# Patient Record
Sex: Male | Born: 1955 | Race: White | Hispanic: No | Marital: Single | Smoking: Never smoker
Health system: Southern US, Community
[De-identification: ages and names within clinical notes are randomized; demographics above are authoritative.]

## PROBLEM LIST (undated history)

## (undated) DIAGNOSIS — E039 Hypothyroidism, unspecified: Secondary | ICD-10-CM

## (undated) DIAGNOSIS — R112 Nausea with vomiting, unspecified: Secondary | ICD-10-CM

## (undated) DIAGNOSIS — I10 Essential (primary) hypertension: Secondary | ICD-10-CM

## (undated) DIAGNOSIS — Z9889 Other specified postprocedural states: Secondary | ICD-10-CM

## (undated) DIAGNOSIS — M1990 Unspecified osteoarthritis, unspecified site: Secondary | ICD-10-CM

## (undated) DIAGNOSIS — K2289 Other specified disease of esophagus: Secondary | ICD-10-CM

## (undated) DIAGNOSIS — T8859XA Other complications of anesthesia, initial encounter: Secondary | ICD-10-CM

## (undated) HISTORY — PX: OTHER SURGICAL HISTORY: SHX169

## (undated) HISTORY — PX: COLONOSCOPY: SHX174

## (undated) HISTORY — PX: FINGER SURGERY: SHX640

## (undated) MED FILL — Dexamethasone Sodium Phosphate Inj 100 MG/10ML: INTRAMUSCULAR | Qty: 1 | Status: AC

---

## 2006-08-25 ENCOUNTER — Ambulatory Visit: Payer: Self-pay | Admitting: Unknown Physician Specialty

## 2007-09-07 ENCOUNTER — Ambulatory Visit: Payer: Self-pay | Admitting: Family Medicine

## 2013-01-19 ENCOUNTER — Ambulatory Visit: Payer: Self-pay | Admitting: Unknown Physician Specialty

## 2014-03-08 ENCOUNTER — Ambulatory Visit: Payer: Self-pay | Admitting: Urology

## 2014-09-20 DIAGNOSIS — K760 Fatty (change of) liver, not elsewhere classified: Secondary | ICD-10-CM | POA: Insufficient documentation

## 2014-09-20 DIAGNOSIS — E669 Obesity, unspecified: Secondary | ICD-10-CM | POA: Insufficient documentation

## 2015-01-16 ENCOUNTER — Other Ambulatory Visit: Payer: Self-pay | Admitting: Family Medicine

## 2015-01-16 DIAGNOSIS — R3129 Other microscopic hematuria: Secondary | ICD-10-CM

## 2017-08-08 DIAGNOSIS — R7401 Elevation of levels of liver transaminase levels: Secondary | ICD-10-CM | POA: Insufficient documentation

## 2020-06-19 ENCOUNTER — Other Ambulatory Visit: Payer: Self-pay | Admitting: Orthopedic Surgery

## 2020-06-19 DIAGNOSIS — M1711 Unilateral primary osteoarthritis, right knee: Secondary | ICD-10-CM

## 2020-07-03 ENCOUNTER — Ambulatory Visit: Payer: 59

## 2020-07-14 ENCOUNTER — Other Ambulatory Visit: Payer: Self-pay

## 2020-07-14 ENCOUNTER — Ambulatory Visit
Admission: RE | Admit: 2020-07-14 | Discharge: 2020-07-14 | Disposition: A | Discharge status: Home / Self Care | Payer: 59 | Source: Ambulatory Visit | Attending: Orthopedic Surgery | Admitting: Orthopedic Surgery

## 2020-07-14 DIAGNOSIS — M1711 Unilateral primary osteoarthritis, right knee: Secondary | ICD-10-CM

## 2020-08-29 ENCOUNTER — Other Ambulatory Visit: Payer: Self-pay | Admitting: Orthopedic Surgery

## 2020-09-01 ENCOUNTER — Encounter (HOSPITAL_COMMUNITY): Payer: Self-pay | Admitting: Urgent Care

## 2020-09-01 ENCOUNTER — Other Ambulatory Visit: Payer: Self-pay

## 2020-09-01 ENCOUNTER — Encounter
Admission: RE | Admit: 2020-09-01 | Discharge: 2020-09-01 | Disposition: A | Discharge status: Home / Self Care | Payer: 59 | Source: Ambulatory Visit | Attending: Orthopedic Surgery | Admitting: Orthopedic Surgery

## 2020-09-01 DIAGNOSIS — Z01818 Encounter for other preprocedural examination: Secondary | ICD-10-CM | POA: Insufficient documentation

## 2020-09-01 HISTORY — DX: Other specified postprocedural states: R11.2

## 2020-09-01 HISTORY — DX: Hypothyroidism, unspecified: E03.9

## 2020-09-01 HISTORY — DX: Other complications of anesthesia, initial encounter: T88.59XA

## 2020-09-01 HISTORY — DX: Other specified postprocedural states: Z98.890

## 2020-09-01 HISTORY — DX: Unspecified osteoarthritis, unspecified site: M19.90

## 2020-09-01 HISTORY — DX: Essential (primary) hypertension: I10

## 2020-09-01 LAB — CBC WITH DIFFERENTIAL/PLATELET
Abs Immature Granulocytes: 0.01 10*3/uL (ref 0.00–0.07)
Basophils Absolute: 0.1 10*3/uL (ref 0.0–0.1)
Basophils Relative: 2 %
Eosinophils Absolute: 0.1 10*3/uL (ref 0.0–0.5)
Eosinophils Relative: 1 %
HCT: 44.2 % (ref 39.0–52.0)
Hemoglobin: 14.5 g/dL (ref 13.0–17.0)
Immature Granulocytes: 0 %
Lymphocytes Relative: 32 %
Lymphs Abs: 1.9 10*3/uL (ref 0.7–4.0)
MCH: 28.5 pg (ref 26.0–34.0)
MCHC: 32.8 g/dL (ref 30.0–36.0)
MCV: 86.8 fL (ref 80.0–100.0)
Monocytes Absolute: 0.5 10*3/uL (ref 0.1–1.0)
Monocytes Relative: 8 %
Neutro Abs: 3.4 10*3/uL (ref 1.7–7.7)
Neutrophils Relative %: 57 %
Platelets: 218 10*3/uL (ref 150–400)
RBC: 5.09 MIL/uL (ref 4.22–5.81)
RDW: 14 % (ref 11.5–15.5)
WBC: 6 10*3/uL (ref 4.0–10.5)
nRBC: 0 % (ref 0.0–0.2)

## 2020-09-01 LAB — TYPE AND SCREEN
ABO/RH(D): A POS
Antibody Screen: NEGATIVE

## 2020-09-01 LAB — COMPREHENSIVE METABOLIC PANEL
ALT: 131 U/L — ABNORMAL HIGH (ref 0–44)
AST: 73 U/L — ABNORMAL HIGH (ref 15–41)
Albumin: 4.1 g/dL (ref 3.5–5.0)
Alkaline Phosphatase: 32 U/L — ABNORMAL LOW (ref 38–126)
Anion gap: 14 (ref 5–15)
BUN: 17 mg/dL (ref 8–23)
CO2: 26 mmol/L (ref 22–32)
Calcium: 9.4 mg/dL (ref 8.9–10.3)
Chloride: 100 mmol/L (ref 98–111)
Creatinine, Ser: 1.01 mg/dL (ref 0.61–1.24)
GFR, Estimated: >60 mL/min (ref >60)
Glucose, Bld: 120 mg/dL — ABNORMAL HIGH (ref 70–99)
Potassium: 2.5 mmol/L — CL (ref 3.5–5.1)
Sodium: 140 mmol/L (ref 135–145)
Total Bilirubin: 0.7 mg/dL (ref 0.3–1.2)
Total Protein: 7.3 g/dL (ref 6.5–8.1)

## 2020-09-01 LAB — SURGICAL PCR SCREEN
MRSA, PCR: NEGATIVE
Staphylococcus aureus: POSITIVE — AB

## 2020-09-01 LAB — URINALYSIS, ROUTINE W REFLEX MICROSCOPIC
Bilirubin Urine: NEGATIVE
Glucose, UA: NEGATIVE mg/dL
Hgb urine dipstick: NEGATIVE
Ketones, ur: NEGATIVE mg/dL
Leukocytes,Ua: NEGATIVE
Nitrite: NEGATIVE
Protein, ur: NEGATIVE mg/dL
Specific Gravity, Urine: 1.021 (ref 1.005–1.030)
pH: 6 (ref 5.0–8.0)

## 2020-09-01 NOTE — Patient Instructions (Addendum)
''''''Your procedure is scheduled on: 09/12/20- TUESDAY Report to the Registration Desk on the 1st floor of the [facility]. To find out your arrival time, please call [phone] between 1PM - 3PM on: 09/11/20- MONDAY  REMEMBER: Instructions that are not followed completely may result in serious medical risk, up to and including death; or upon the discretion of your surgeon and anesthesiologist your surgery may need to be rescheduled.  Do not eat food after midnight the night before surgery.  No gum chewing, lozengers or hard candies.  You may however, drink CLEAR liquids up to 2 hours before you are scheduled to arrive for your surgery. Do not drink anything within 2 hours of your scheduled arrival time.  Clear liquids include: - water  - apple juice without pulp - gatorade (not RED, PURPLE, OR BLUE) - black coffee or tea (Do NOT add milk or creamers to the coffee or tea) Do NOT drink anything that is not on this list.  In addition, your doctor has ordered for you to drink the provided  Ensure Pre-Surgery Clear Carbohydrate Drink  Drinking this carbohydrate drink up to two hours before surgery helps to reduce insulin resistance and improve patient outcomes. Please complete drinking 2 hours prior to scheduled arrival time.  TAKE THESE MEDICATIONS THE MORNING OF SURGERY WITH A SIP OF WATER: - levothyroxine (SYNTHROID) 100 MCG tablet  One week prior to surgery: STOP TAKING 09/03/20: Stop Anti-inflammatories (NSAIDS) such as Advil, Aleve, Ibuprofen, Motrin, Naproxen, Naprosyn and Aspirin based products such as Excedrin, Goodys Powder, BC Powder. MAY TAKE TYLENOL AS DIRECTED AS NEEDED.  Stop ANY OVER THE COUNTER supplements STARTING 09/03/20 until after surgery.Turmeric 500 MG CAPS, vitamin C (ASCORBIC ACID) 500 MG tablet,Cinnamon 500 MG capsule. (However, you may continue taking Vitamin D, Vitamin B, and multivitamin up until the day before surgery.)  No Alcohol for 24 hours before  or after surgery.  No Smoking including e-cigarettes for 24 hours prior to surgery.  No chewable tobacco products for at least 6 hours prior to surgery.  No nicotine patches on the day of surgery.  Do not use any "recreational" drugs for at least a week prior to your surgery.  Please be advised that the combination of cocaine and anesthesia may have negative outcomes, up to and including death. If you test positive for cocaine, your surgery will be cancelled.  On the morning of surgery brush your teeth with toothpaste and water, you may rinse your mouth with mouthwash if you wish. Do not swallow any toothpaste or mouthwash.  Do not wear jewelry, make-up, hairpins, clips or nail polish.  Do not wear lotions, powders, or perfumes.   Do not shave body from the neck down 48 hours prior to surgery just in case you cut yourself which could leave a site for infection.  Also, freshly shaved skin may become irritated if using the CHG soap.  Contact lenses, hearing aids and dentures may not be worn into surgery.  Do not bring valuables to the hospital. [facility] is not responsible for any missing/lost belongings or valuables.   Use CHG Soap or wipes as directed on instruction sheet.  Bring your C-PAP to the hospital with you in case you may have to spend the night.   Notify your doctor if there is any change in your medical condition (cold, fever, infection).  Wear comfortable clothing (specific to your surgery type) to the hospital.  Plan for stool softeners for home use; pain medications have a  tendency to cause constipation. You can also help prevent constipation by eating foods high in fiber such as fruits and vegetables and drinking plenty of fluids as your diet allows.  After surgery, you can help prevent lung complications by doing breathing exercises.  Take deep breaths and cough every 1-2 hours. Your doctor may order a device called an Incentive Spirometer to help you take deep  breaths. When coughing or sneezing, hold a pillow firmly against your incision with both hands. This is called "splinting." Doing this helps protect your incision. It also decreases belly discomfort.  If you are being admitted to the hospital overnight, leave your suitcase in the car. After surgery it may be brought to your room.  If you are being discharged the day of surgery, you will not be allowed to drive home. You will need a responsible adult (18 years or older) to drive you home and stay with you that night.   If you are taking public transportation, you will need to have a responsible adult (18 years or older) with you. Please confirm with your physician that it is acceptable to use public transportation.   Please call the [place] Dept. at [phone] if you have any questions about these instructions.  Visitation Policy:  Patients undergoing a surgery or procedure may have one family member or support person with them as long as that person is not COVID-19 positive or experiencing its symptoms.  That person may remain in the waiting area during the procedure.  Inpatient Visitation:    Visiting hours are 7 a.m. to 8 p.m. Patients will be allowed one visitor. The visitor may change daily. The visitor must pass COVID-19 screenings, use hand sanitizer when entering and exiting the patient's room and wear a mask at all times, including in the patient's room. Patients must also wear a mask when staff or their visitor are in the room. Masking is required regardless of vaccination status. Systemwide, no visitors 17 or younger.

## 2020-09-01 NOTE — Progress Notes (Signed)
  [facility] Perioperative Services: Pre-Admission/Anesthesia Testing  Abnormal Lab Notification   Date: 09/01/20  Name: Jonathan [name] MRN:   [id number]  Re: Abnormal labs noted during PAT appointment   Provider(s) Notified: Hessie Knows, MD Notification mode: Routed and/or faxed via [facility] LAB VALUE(S): Lab Results  Component Value Date   K 2.5 (LL) 09/01/2020   AST 73 (H) 09/01/2020   ALT 131 (H) 09/01/2020   Notes:  Patient is scheduled for a TOTAL KNEE ARTHROPLASTY (Right Knee) on 09/12/2020.   1. HYPOkalemia  EMR reviewed. Patient is on daily diuretic therapy (chlorthalidone 25 mg).   Patient already taking daily Klor-Con 20 mEq.    Forwarded to surgeon for review and optimization.   Order entered for K+ to be rechecked on day of procedure.   2. Transaminitis  Historical lab data reviewed. Transient elevations noted of AST and ALT noted dating back to 2015.   ALP and total bilirubin have been WNL.   Today's values are the highest recorded for this patient that I am able to see.   PMH (+) for obesity and hepatic steatosis. Denies ETOH use.   Forwarding results to surgeon for review.   This is a Community education officer; no formal response is required.  Honor Loh, MSN, APRN, FNP-C, CEN [facility]  Peri-operative Services Nurse Practitioner Phone: [phone] Fax: [phone] 09/01/20 11:45 AM

## 2020-09-08 ENCOUNTER — Other Ambulatory Visit: Admission: RE | Admit: 2020-09-08 | Payer: 59 | Source: Ambulatory Visit

## 2020-09-12 ENCOUNTER — Encounter: Admission: RE | Payer: Self-pay | Source: Home / Self Care

## 2020-09-12 ENCOUNTER — Ambulatory Visit: Admission: RE | Admit: 2020-09-12 | Payer: 59 | Source: Home / Self Care | Admitting: Orthopedic Surgery

## 2020-09-12 SURGERY — ARTHROPLASTY, KNEE, TOTAL
Anesthesia: Choice | Site: Knee | Laterality: Right

## 2021-02-02 ENCOUNTER — Other Ambulatory Visit (HOSPITAL_COMMUNITY): Payer: Self-pay | Admitting: Gastroenterology

## 2021-02-02 ENCOUNTER — Other Ambulatory Visit: Payer: Self-pay | Admitting: Gastroenterology

## 2021-02-02 DIAGNOSIS — K760 Fatty (change of) liver, not elsewhere classified: Secondary | ICD-10-CM

## 2021-02-02 DIAGNOSIS — R7401 Elevation of levels of liver transaminase levels: Secondary | ICD-10-CM

## 2021-02-26 ENCOUNTER — Ambulatory Visit: Payer: 59

## 2021-03-07 ENCOUNTER — Other Ambulatory Visit: Payer: Self-pay

## 2021-03-07 ENCOUNTER — Ambulatory Visit
Admission: RE | Admit: 2021-03-07 | Discharge: 2021-03-07 | Disposition: A | Discharge status: Home / Self Care | Payer: 59 | Source: Ambulatory Visit | Attending: Gastroenterology | Admitting: Gastroenterology

## 2021-03-07 DIAGNOSIS — K760 Fatty (change of) liver, not elsewhere classified: Secondary | ICD-10-CM | POA: Insufficient documentation

## 2021-03-07 DIAGNOSIS — R7401 Elevation of levels of liver transaminase levels: Secondary | ICD-10-CM | POA: Diagnosis present

## 2021-04-06 ENCOUNTER — Other Ambulatory Visit: Payer: Self-pay | Admitting: Otolaryngology

## 2021-04-06 DIAGNOSIS — H9311 Tinnitus, right ear: Secondary | ICD-10-CM

## 2021-04-18 ENCOUNTER — Ambulatory Visit
Admission: RE | Admit: 2021-04-18 | Discharge: 2021-04-18 | Disposition: A | Discharge status: Home / Self Care | Payer: 59 | Source: Ambulatory Visit | Attending: Otolaryngology | Admitting: Otolaryngology

## 2021-04-18 ENCOUNTER — Other Ambulatory Visit: Payer: Self-pay

## 2021-04-18 DIAGNOSIS — H9311 Tinnitus, right ear: Secondary | ICD-10-CM | POA: Insufficient documentation

## 2021-04-18 MED ORDER — GADOBUTROL 1 MMOL/ML IV SOLN
10.0000 mL | Freq: Once | INTRAVENOUS | Status: AC | PRN
  Administered 2021-04-18: 10 mL via INTRAVENOUS

## 2021-07-31 DIAGNOSIS — M9902 Segmental and somatic dysfunction of thoracic region: Secondary | ICD-10-CM | POA: Diagnosis not present

## 2021-07-31 DIAGNOSIS — M9901 Segmental and somatic dysfunction of cervical region: Secondary | ICD-10-CM | POA: Diagnosis not present

## 2021-07-31 DIAGNOSIS — M62830 Muscle spasm of back: Secondary | ICD-10-CM | POA: Diagnosis not present

## 2021-07-31 DIAGNOSIS — M9903 Segmental and somatic dysfunction of lumbar region: Secondary | ICD-10-CM | POA: Diagnosis not present

## 2021-09-04 DIAGNOSIS — M9901 Segmental and somatic dysfunction of cervical region: Secondary | ICD-10-CM | POA: Diagnosis not present

## 2021-09-04 DIAGNOSIS — M9902 Segmental and somatic dysfunction of thoracic region: Secondary | ICD-10-CM | POA: Diagnosis not present

## 2021-09-04 DIAGNOSIS — M62830 Muscle spasm of back: Secondary | ICD-10-CM | POA: Diagnosis not present

## 2021-09-04 DIAGNOSIS — M9903 Segmental and somatic dysfunction of lumbar region: Secondary | ICD-10-CM | POA: Diagnosis not present

## 2021-10-02 DIAGNOSIS — M9901 Segmental and somatic dysfunction of cervical region: Secondary | ICD-10-CM | POA: Diagnosis not present

## 2021-10-02 DIAGNOSIS — M62830 Muscle spasm of back: Secondary | ICD-10-CM | POA: Diagnosis not present

## 2021-10-02 DIAGNOSIS — M9902 Segmental and somatic dysfunction of thoracic region: Secondary | ICD-10-CM | POA: Diagnosis not present

## 2021-10-02 DIAGNOSIS — M9903 Segmental and somatic dysfunction of lumbar region: Secondary | ICD-10-CM | POA: Diagnosis not present

## 2021-10-17 DIAGNOSIS — E039 Hypothyroidism, unspecified: Secondary | ICD-10-CM | POA: Diagnosis not present

## 2021-10-17 DIAGNOSIS — Z0000 Encounter for general adult medical examination without abnormal findings: Secondary | ICD-10-CM | POA: Diagnosis not present

## 2021-10-17 DIAGNOSIS — Z125 Encounter for screening for malignant neoplasm of prostate: Secondary | ICD-10-CM | POA: Diagnosis not present

## 2021-10-24 DIAGNOSIS — I10 Essential (primary) hypertension: Secondary | ICD-10-CM | POA: Diagnosis not present

## 2021-10-24 DIAGNOSIS — E039 Hypothyroidism, unspecified: Secondary | ICD-10-CM | POA: Diagnosis not present

## 2021-10-24 DIAGNOSIS — Z0000 Encounter for general adult medical examination without abnormal findings: Secondary | ICD-10-CM | POA: Diagnosis not present

## 2021-10-24 DIAGNOSIS — E785 Hyperlipidemia, unspecified: Secondary | ICD-10-CM | POA: Diagnosis not present

## 2021-11-05 DIAGNOSIS — M62830 Muscle spasm of back: Secondary | ICD-10-CM | POA: Diagnosis not present

## 2021-11-05 DIAGNOSIS — M9901 Segmental and somatic dysfunction of cervical region: Secondary | ICD-10-CM | POA: Diagnosis not present

## 2021-11-05 DIAGNOSIS — M9902 Segmental and somatic dysfunction of thoracic region: Secondary | ICD-10-CM | POA: Diagnosis not present

## 2021-11-05 DIAGNOSIS — M9903 Segmental and somatic dysfunction of lumbar region: Secondary | ICD-10-CM | POA: Diagnosis not present

## 2021-11-23 DIAGNOSIS — R7989 Other specified abnormal findings of blood chemistry: Secondary | ICD-10-CM | POA: Diagnosis not present

## 2021-11-23 DIAGNOSIS — R7303 Prediabetes: Secondary | ICD-10-CM | POA: Diagnosis not present

## 2021-11-23 DIAGNOSIS — E039 Hypothyroidism, unspecified: Secondary | ICD-10-CM | POA: Diagnosis not present

## 2021-12-05 DIAGNOSIS — E876 Hypokalemia: Secondary | ICD-10-CM | POA: Diagnosis not present

## 2021-12-05 DIAGNOSIS — R059 Cough, unspecified: Secondary | ICD-10-CM | POA: Diagnosis not present

## 2021-12-05 DIAGNOSIS — I10 Essential (primary) hypertension: Secondary | ICD-10-CM | POA: Diagnosis not present

## 2021-12-11 DIAGNOSIS — M9903 Segmental and somatic dysfunction of lumbar region: Secondary | ICD-10-CM | POA: Diagnosis not present

## 2021-12-11 DIAGNOSIS — M9901 Segmental and somatic dysfunction of cervical region: Secondary | ICD-10-CM | POA: Diagnosis not present

## 2021-12-11 DIAGNOSIS — M9902 Segmental and somatic dysfunction of thoracic region: Secondary | ICD-10-CM | POA: Diagnosis not present

## 2021-12-11 DIAGNOSIS — M62830 Muscle spasm of back: Secondary | ICD-10-CM | POA: Diagnosis not present

## 2021-12-14 DIAGNOSIS — I10 Essential (primary) hypertension: Secondary | ICD-10-CM | POA: Diagnosis not present

## 2022-01-08 DIAGNOSIS — M9903 Segmental and somatic dysfunction of lumbar region: Secondary | ICD-10-CM | POA: Diagnosis not present

## 2022-01-08 DIAGNOSIS — M62830 Muscle spasm of back: Secondary | ICD-10-CM | POA: Diagnosis not present

## 2022-01-08 DIAGNOSIS — M9902 Segmental and somatic dysfunction of thoracic region: Secondary | ICD-10-CM | POA: Diagnosis not present

## 2022-01-08 DIAGNOSIS — M9901 Segmental and somatic dysfunction of cervical region: Secondary | ICD-10-CM | POA: Diagnosis not present

## 2022-01-28 DIAGNOSIS — M1711 Unilateral primary osteoarthritis, right knee: Secondary | ICD-10-CM | POA: Diagnosis not present

## 2022-02-05 DIAGNOSIS — M9902 Segmental and somatic dysfunction of thoracic region: Secondary | ICD-10-CM | POA: Diagnosis not present

## 2022-02-05 DIAGNOSIS — M9901 Segmental and somatic dysfunction of cervical region: Secondary | ICD-10-CM | POA: Diagnosis not present

## 2022-02-05 DIAGNOSIS — M9903 Segmental and somatic dysfunction of lumbar region: Secondary | ICD-10-CM | POA: Diagnosis not present

## 2022-02-05 DIAGNOSIS — M62830 Muscle spasm of back: Secondary | ICD-10-CM | POA: Diagnosis not present

## 2022-02-06 DIAGNOSIS — I10 Essential (primary) hypertension: Secondary | ICD-10-CM | POA: Diagnosis not present

## 2022-02-06 DIAGNOSIS — Z9989 Dependence on other enabling machines and devices: Secondary | ICD-10-CM | POA: Diagnosis not present

## 2022-02-06 DIAGNOSIS — R42 Dizziness and giddiness: Secondary | ICD-10-CM | POA: Diagnosis not present

## 2022-02-06 DIAGNOSIS — H938X1 Other specified disorders of right ear: Secondary | ICD-10-CM | POA: Diagnosis not present

## 2022-02-06 DIAGNOSIS — G4733 Obstructive sleep apnea (adult) (pediatric): Secondary | ICD-10-CM | POA: Diagnosis not present

## 2022-02-13 DIAGNOSIS — M1711 Unilateral primary osteoarthritis, right knee: Secondary | ICD-10-CM | POA: Diagnosis not present

## 2022-03-04 DIAGNOSIS — L408 Other psoriasis: Secondary | ICD-10-CM | POA: Diagnosis not present

## 2022-03-04 DIAGNOSIS — L218 Other seborrheic dermatitis: Secondary | ICD-10-CM | POA: Diagnosis not present

## 2022-03-12 DIAGNOSIS — I10 Essential (primary) hypertension: Secondary | ICD-10-CM | POA: Diagnosis not present

## 2022-03-12 DIAGNOSIS — R7303 Prediabetes: Secondary | ICD-10-CM | POA: Insufficient documentation

## 2022-03-12 DIAGNOSIS — Z6841 Body Mass Index (BMI) 40.0 and over, adult: Secondary | ICD-10-CM | POA: Diagnosis not present

## 2022-03-12 DIAGNOSIS — R7989 Other specified abnormal findings of blood chemistry: Secondary | ICD-10-CM | POA: Diagnosis not present

## 2022-03-12 DIAGNOSIS — E876 Hypokalemia: Secondary | ICD-10-CM | POA: Diagnosis not present

## 2022-03-19 DIAGNOSIS — H6121 Impacted cerumen, right ear: Secondary | ICD-10-CM | POA: Diagnosis not present

## 2022-03-19 DIAGNOSIS — H6063 Unspecified chronic otitis externa, bilateral: Secondary | ICD-10-CM | POA: Diagnosis not present

## 2022-03-19 DIAGNOSIS — H93291 Other abnormal auditory perceptions, right ear: Secondary | ICD-10-CM | POA: Diagnosis not present

## 2022-03-26 DIAGNOSIS — M62830 Muscle spasm of back: Secondary | ICD-10-CM | POA: Diagnosis not present

## 2022-03-26 DIAGNOSIS — M9903 Segmental and somatic dysfunction of lumbar region: Secondary | ICD-10-CM | POA: Diagnosis not present

## 2022-03-26 DIAGNOSIS — M9902 Segmental and somatic dysfunction of thoracic region: Secondary | ICD-10-CM | POA: Diagnosis not present

## 2022-03-26 DIAGNOSIS — M9901 Segmental and somatic dysfunction of cervical region: Secondary | ICD-10-CM | POA: Diagnosis not present

## 2022-04-04 DIAGNOSIS — N189 Chronic kidney disease, unspecified: Secondary | ICD-10-CM | POA: Diagnosis not present

## 2022-04-04 DIAGNOSIS — I129 Hypertensive chronic kidney disease with stage 1 through stage 4 chronic kidney disease, or unspecified chronic kidney disease: Secondary | ICD-10-CM | POA: Diagnosis not present

## 2022-04-04 DIAGNOSIS — M1990 Unspecified osteoarthritis, unspecified site: Secondary | ICD-10-CM | POA: Diagnosis not present

## 2022-04-04 DIAGNOSIS — Z008 Encounter for other general examination: Secondary | ICD-10-CM | POA: Diagnosis not present

## 2022-04-04 DIAGNOSIS — Z86010 Personal history of colonic polyps: Secondary | ICD-10-CM | POA: Diagnosis not present

## 2022-04-04 DIAGNOSIS — E039 Hypothyroidism, unspecified: Secondary | ICD-10-CM | POA: Diagnosis not present

## 2022-04-04 DIAGNOSIS — G4733 Obstructive sleep apnea (adult) (pediatric): Secondary | ICD-10-CM | POA: Diagnosis not present

## 2022-04-04 DIAGNOSIS — Z6841 Body Mass Index (BMI) 40.0 and over, adult: Secondary | ICD-10-CM | POA: Diagnosis not present

## 2022-04-10 DIAGNOSIS — G4733 Obstructive sleep apnea (adult) (pediatric): Secondary | ICD-10-CM | POA: Diagnosis not present

## 2022-04-22 DIAGNOSIS — M1712 Unilateral primary osteoarthritis, left knee: Secondary | ICD-10-CM | POA: Diagnosis not present

## 2022-04-23 DIAGNOSIS — M62830 Muscle spasm of back: Secondary | ICD-10-CM | POA: Diagnosis not present

## 2022-04-23 DIAGNOSIS — M9902 Segmental and somatic dysfunction of thoracic region: Secondary | ICD-10-CM | POA: Diagnosis not present

## 2022-04-23 DIAGNOSIS — M9903 Segmental and somatic dysfunction of lumbar region: Secondary | ICD-10-CM | POA: Diagnosis not present

## 2022-04-23 DIAGNOSIS — M9901 Segmental and somatic dysfunction of cervical region: Secondary | ICD-10-CM | POA: Diagnosis not present

## 2022-04-26 DIAGNOSIS — H903 Sensorineural hearing loss, bilateral: Secondary | ICD-10-CM | POA: Diagnosis not present

## 2022-04-26 DIAGNOSIS — H9121 Sudden idiopathic hearing loss, right ear: Secondary | ICD-10-CM | POA: Diagnosis not present

## 2022-04-26 DIAGNOSIS — H6121 Impacted cerumen, right ear: Secondary | ICD-10-CM | POA: Diagnosis not present

## 2022-04-26 DIAGNOSIS — H8101 Meniere's disease, right ear: Secondary | ICD-10-CM | POA: Diagnosis not present

## 2022-05-21 DIAGNOSIS — M9901 Segmental and somatic dysfunction of cervical region: Secondary | ICD-10-CM | POA: Diagnosis not present

## 2022-05-21 DIAGNOSIS — M62830 Muscle spasm of back: Secondary | ICD-10-CM | POA: Diagnosis not present

## 2022-05-21 DIAGNOSIS — M9903 Segmental and somatic dysfunction of lumbar region: Secondary | ICD-10-CM | POA: Diagnosis not present

## 2022-05-21 DIAGNOSIS — M9902 Segmental and somatic dysfunction of thoracic region: Secondary | ICD-10-CM | POA: Diagnosis not present

## 2022-05-28 DIAGNOSIS — H8101 Meniere's disease, right ear: Secondary | ICD-10-CM | POA: Diagnosis not present

## 2022-06-14 DIAGNOSIS — H90A21 Sensorineural hearing loss, unilateral, right ear, with restricted hearing on the contralateral side: Secondary | ICD-10-CM | POA: Diagnosis not present

## 2022-06-14 DIAGNOSIS — H8101 Meniere's disease, right ear: Secondary | ICD-10-CM | POA: Diagnosis not present

## 2022-06-14 DIAGNOSIS — H6121 Impacted cerumen, right ear: Secondary | ICD-10-CM | POA: Diagnosis not present

## 2022-06-18 DIAGNOSIS — M9902 Segmental and somatic dysfunction of thoracic region: Secondary | ICD-10-CM | POA: Diagnosis not present

## 2022-06-18 DIAGNOSIS — M9903 Segmental and somatic dysfunction of lumbar region: Secondary | ICD-10-CM | POA: Diagnosis not present

## 2022-06-18 DIAGNOSIS — M9901 Segmental and somatic dysfunction of cervical region: Secondary | ICD-10-CM | POA: Diagnosis not present

## 2022-06-18 DIAGNOSIS — M62830 Muscle spasm of back: Secondary | ICD-10-CM | POA: Diagnosis not present

## 2022-06-24 DIAGNOSIS — H5213 Myopia, bilateral: Secondary | ICD-10-CM | POA: Diagnosis not present

## 2022-06-24 DIAGNOSIS — Z0100 Encounter for examination of eyes and vision without abnormal findings: Secondary | ICD-10-CM | POA: Diagnosis not present

## 2022-07-10 DIAGNOSIS — R7401 Elevation of levels of liver transaminase levels: Secondary | ICD-10-CM | POA: Diagnosis not present

## 2022-07-10 DIAGNOSIS — Z125 Encounter for screening for malignant neoplasm of prostate: Secondary | ICD-10-CM | POA: Diagnosis not present

## 2022-07-10 DIAGNOSIS — H9311 Tinnitus, right ear: Secondary | ICD-10-CM | POA: Diagnosis not present

## 2022-07-10 DIAGNOSIS — R7303 Prediabetes: Secondary | ICD-10-CM | POA: Diagnosis not present

## 2022-07-10 DIAGNOSIS — I10 Essential (primary) hypertension: Secondary | ICD-10-CM | POA: Diagnosis not present

## 2022-07-10 DIAGNOSIS — E876 Hypokalemia: Secondary | ICD-10-CM | POA: Diagnosis not present

## 2022-07-16 DIAGNOSIS — M9902 Segmental and somatic dysfunction of thoracic region: Secondary | ICD-10-CM | POA: Diagnosis not present

## 2022-07-16 DIAGNOSIS — M9903 Segmental and somatic dysfunction of lumbar region: Secondary | ICD-10-CM | POA: Diagnosis not present

## 2022-07-16 DIAGNOSIS — M62830 Muscle spasm of back: Secondary | ICD-10-CM | POA: Diagnosis not present

## 2022-07-16 DIAGNOSIS — M9901 Segmental and somatic dysfunction of cervical region: Secondary | ICD-10-CM | POA: Diagnosis not present

## 2022-08-08 DIAGNOSIS — E876 Hypokalemia: Secondary | ICD-10-CM | POA: Diagnosis not present

## 2022-08-12 ENCOUNTER — Other Ambulatory Visit (HOSPITAL_BASED_OUTPATIENT_CLINIC_OR_DEPARTMENT_OTHER): Payer: Self-pay | Admitting: Family Medicine

## 2022-08-12 DIAGNOSIS — R0789 Other chest pain: Secondary | ICD-10-CM

## 2022-08-12 DIAGNOSIS — R1310 Dysphagia, unspecified: Secondary | ICD-10-CM | POA: Diagnosis not present

## 2022-08-12 DIAGNOSIS — I10 Essential (primary) hypertension: Secondary | ICD-10-CM | POA: Diagnosis not present

## 2022-08-13 DIAGNOSIS — M9903 Segmental and somatic dysfunction of lumbar region: Secondary | ICD-10-CM | POA: Diagnosis not present

## 2022-08-13 DIAGNOSIS — M62830 Muscle spasm of back: Secondary | ICD-10-CM | POA: Diagnosis not present

## 2022-08-13 DIAGNOSIS — M9901 Segmental and somatic dysfunction of cervical region: Secondary | ICD-10-CM | POA: Diagnosis not present

## 2022-08-13 DIAGNOSIS — M9902 Segmental and somatic dysfunction of thoracic region: Secondary | ICD-10-CM | POA: Diagnosis not present

## 2022-08-15 ENCOUNTER — Ambulatory Visit
Admission: RE | Admit: 2022-08-15 | Discharge: 2022-08-15 | Disposition: A | Discharge status: Home / Self Care | Payer: No Typology Code available for payment source | Source: Ambulatory Visit | Attending: Family Medicine | Admitting: Family Medicine

## 2022-08-15 DIAGNOSIS — R0789 Other chest pain: Secondary | ICD-10-CM | POA: Insufficient documentation

## 2022-08-15 DIAGNOSIS — K224 Dyskinesia of esophagus: Secondary | ICD-10-CM | POA: Diagnosis not present

## 2022-08-15 DIAGNOSIS — R1310 Dysphagia, unspecified: Secondary | ICD-10-CM | POA: Insufficient documentation

## 2022-08-15 DIAGNOSIS — K219 Gastro-esophageal reflux disease without esophagitis: Secondary | ICD-10-CM | POA: Diagnosis not present

## 2022-08-21 DIAGNOSIS — R933 Abnormal findings on diagnostic imaging of other parts of digestive tract: Secondary | ICD-10-CM | POA: Diagnosis not present

## 2022-08-21 DIAGNOSIS — Z86010 Personal history of colonic polyps: Secondary | ICD-10-CM | POA: Diagnosis not present

## 2022-08-21 DIAGNOSIS — R1319 Other dysphagia: Secondary | ICD-10-CM | POA: Diagnosis not present

## 2022-08-22 NOTE — H&P (Signed)
Pre-Procedure H&P   Patient ID: Jonathan [name] is a 67 y.o. male.  Gastroenterology Provider: Annamaria Helling, DO  Referring Provider: Octavia Bruckner, PA PCP: Maryland Pink, MD  Date: 08/23/2022  HPI Mr. Jonathan [name] is a 67 y.o. male who presents today for Esophagogastroduodenoscopy for Dysphagia, abnormal barium swallow study .  Patient is experienced several weeks of dysphagia to solid foods.  No dysphagia to pills or liquids.  No odynophagia.  He has had 5 pound intentional weight loss.  Over this time.  He has felt dysphagia with food with localized sticking in the mid to lower sternum.  His primary care provider had him undergo barium swallow study which demonstrated distal marked mucosal irregularity with mural filling defect concerning for malignancy.  Barium tablet easily passed.  A small hiatal hernia was also noted on the study.  He has not noted any melena or hematochezia   He denies any reflux symptoms.  He is currently on omeprazole 20 mg daily that was recently started.  He denies any tobacco use.  Creatinine 1.0 ALT 56 AST 30 total bili 0.4 14.8 hemoglobin MCV 86 platelets 214,000  No family history of GI disease or malignancy  Colonoscopy in March 2019 with 1 tubular adenoma.  2008 colonoscopy internal hemorrhoids and diverticulosis   Past Medical History:  Diagnosis Date   Arthritis    Complication of anesthesia    OCCURRED ONCE YEARS AGO 1981   Hypertension    Hypothyroidism    PONV (postoperative nausea and vomiting)     Past Surgical History:  Procedure Laterality Date   COLONOSCOPY     FINGER SURGERY     TENDON REPAIR IN LEFT KNEE      Family History No h/o GI disease or malignancy  Review of Systems  Constitutional:  Negative for activity change, appetite change, chills, diaphoresis, fatigue, fever and unexpected weight change.  HENT:  Positive for trouble swallowing. Negative for voice change.   Respiratory:  Negative for  shortness of breath and wheezing.   Cardiovascular:  Negative for chest pain, palpitations and leg swelling.  Gastrointestinal:  Negative for abdominal distention, abdominal pain, anal bleeding, blood in stool, constipation, diarrhea, nausea and vomiting.  Musculoskeletal:  Negative for arthralgias and myalgias.  Skin:  Negative for color change and pallor.  Neurological:  Negative for dizziness, syncope and weakness.  Psychiatric/Behavioral:  Negative for confusion. The patient is not nervous/anxious.   All other systems reviewed and are negative.    Medications No current facility-administered medications on file prior to encounter.   Current Outpatient Medications on File Prior to Encounter  Medication Sig Dispense Refill   acetaminophen (TYLENOL) 650 MG CR tablet Take 1,300 mg by mouth every 8 (eight) hours as needed for pain.     atenolol-chlorthalidone (TENORETIC) 50-25 MG tablet Take 1 tablet by mouth every morning.     Cholecalciferol (VITAMIN D3) 125 MCG (5000 UT) CAPS Take 5,000 Units by mouth daily.     Cinnamon 500 MG capsule Take 1,000 mg by mouth daily.     ibuprofen (ADVIL) 200 MG tablet Take 400 mg by mouth every 8 (eight) hours as needed for moderate pain.     levothyroxine (SYNTHROID) 100 MCG tablet Take 100 mcg by mouth daily before breakfast.     melatonin 5 MG TABS Take 5 mg by mouth at bedtime.     Multiple Vitamins-Minerals (MULTIVITAMIN WITH MINERALS) tablet Take 1 tablet by mouth daily.     potassium  chloride SA (KLOR-CON) 20 MEQ tablet Take 20 mEq by mouth daily.     Turmeric 500 MG CAPS Take 500 mg by mouth daily.     vitamin C (ASCORBIC ACID) 500 MG tablet Take 500 mg by mouth daily.      Pertinent medications related to GI and procedure were reviewed by me with the patient prior to the procedure   Current Facility-Administered Medications:    0.9 %  sodium chloride infusion, , Intravenous, Continuous, Annamaria Helling, DO, Last Rate: 20 mL/hr at  08/23/22 1220, 20 mL/hr at 08/23/22 1220      No Known Allergies Allergies were reviewed by me prior to the procedure  Objective   Body mass index is 39.19 kg/m. Vitals:   08/23/22 1205  BP: (!) 141/87  Pulse: 71  Resp: 20  Temp: (!) 97.3 F (36.3 C)  TempSrc: Temporal  SpO2: 99%  Weight: 120.4 kg  Height: '5\' 9"'$  (1.753 m)     Physical Exam Vitals and nursing note reviewed.  Constitutional:      General: He is not in acute distress.    Appearance: Normal appearance. He is not ill-appearing, toxic-appearing or diaphoretic.  HENT:     Head: Normocephalic and atraumatic.     Nose: Nose normal.     Mouth/Throat:     Mouth: Mucous membranes are moist.     Pharynx: Oropharynx is clear.  Eyes:     General: No scleral icterus.    Extraocular Movements: Extraocular movements intact.  Cardiovascular:     Rate and Rhythm: Normal rate and regular rhythm.     Heart sounds: Normal heart sounds. No murmur heard.    No friction rub. No gallop.  Pulmonary:     Effort: Pulmonary effort is normal. No respiratory distress.     Breath sounds: Normal breath sounds. No wheezing, rhonchi or rales.  Abdominal:     General: Bowel sounds are normal. There is no distension.     Palpations: Abdomen is soft.     Tenderness: There is no abdominal tenderness. There is no guarding or rebound.  Musculoskeletal:     Cervical back: Neck supple.     Right lower leg: No edema.     Left lower leg: No edema.  Skin:    General: Skin is warm and dry.     Coloration: Skin is not jaundiced or pale.  Neurological:     General: No focal deficit present.     Mental Status: He is alert and oriented to person, place, and time. Mental status is at baseline.  Psychiatric:        Mood and Affect: Mood normal.        Behavior: Behavior normal.        Thought Content: Thought content normal.        Judgment: Judgment normal.      Assessment:  Mr. Jonathan [name] is a 67 y.o. male  who presents today  for Esophagogastroduodenoscopy for Dysphagia, abnormal barium swallow study .  Plan:  Esophagogastroduodenoscopy with possible intervention today  Esophagogastroduodenoscopy with possible biopsy, control of bleeding, polypectomy, and interventions as necessary has been discussed with the patient/patient representative. Informed consent was obtained from the patient/patient representative after explaining the indication, nature, and risks of the procedure including but not limited to death, bleeding, perforation, missed neoplasm/lesions, cardiorespiratory compromise, and reaction to medications. Opportunity for questions was given and appropriate answers were provided. Patient/patient representative has verbalized understanding is amenable to undergoing  the procedure.   Annamaria Helling, DO  [facility] Gastroenterology  Portions of the record may have been created with voice recognition software. Occasional wrong-word or 'sound-a-like' substitutions may have occurred due to the inherent limitations of voice recognition software.  Read the chart carefully and recognize, using context, where substitutions may have occurred.

## 2022-08-23 ENCOUNTER — Encounter
Admission: RE | Disposition: A | Discharge status: Home / Self Care | Payer: Self-pay | Source: Home / Self Care | Attending: Gastroenterology

## 2022-08-23 ENCOUNTER — Ambulatory Visit: Payer: No Typology Code available for payment source | Admitting: Registered Nurse

## 2022-08-23 ENCOUNTER — Other Ambulatory Visit: Payer: Self-pay | Admitting: Gastroenterology

## 2022-08-23 ENCOUNTER — Ambulatory Visit
Admission: RE | Admit: 2022-08-23 | Discharge: 2022-08-23 | Disposition: A | Discharge status: Home / Self Care | Payer: No Typology Code available for payment source | Attending: Gastroenterology | Admitting: Gastroenterology

## 2022-08-23 ENCOUNTER — Encounter: Payer: Self-pay | Admitting: Gastroenterology

## 2022-08-23 DIAGNOSIS — Z6839 Body mass index (BMI) 39.0-39.9, adult: Secondary | ICD-10-CM | POA: Diagnosis not present

## 2022-08-23 DIAGNOSIS — K2289 Other specified disease of esophagus: Secondary | ICD-10-CM | POA: Diagnosis not present

## 2022-08-23 DIAGNOSIS — E669 Obesity, unspecified: Secondary | ICD-10-CM | POA: Diagnosis not present

## 2022-08-23 DIAGNOSIS — K648 Other hemorrhoids: Secondary | ICD-10-CM | POA: Diagnosis not present

## 2022-08-23 DIAGNOSIS — C155 Malignant neoplasm of lower third of esophagus: Secondary | ICD-10-CM | POA: Insufficient documentation

## 2022-08-23 DIAGNOSIS — Z79899 Other long term (current) drug therapy: Secondary | ICD-10-CM | POA: Diagnosis not present

## 2022-08-23 DIAGNOSIS — R933 Abnormal findings on diagnostic imaging of other parts of digestive tract: Secondary | ICD-10-CM | POA: Insufficient documentation

## 2022-08-23 DIAGNOSIS — K2970 Gastritis, unspecified, without bleeding: Secondary | ICD-10-CM | POA: Insufficient documentation

## 2022-08-23 DIAGNOSIS — C159 Malignant neoplasm of esophagus, unspecified: Secondary | ICD-10-CM | POA: Diagnosis not present

## 2022-08-23 DIAGNOSIS — I10 Essential (primary) hypertension: Secondary | ICD-10-CM | POA: Diagnosis not present

## 2022-08-23 DIAGNOSIS — R1319 Other dysphagia: Secondary | ICD-10-CM

## 2022-08-23 DIAGNOSIS — E039 Hypothyroidism, unspecified: Secondary | ICD-10-CM | POA: Diagnosis not present

## 2022-08-23 HISTORY — PX: ESOPHAGOGASTRODUODENOSCOPY: SHX5428

## 2022-08-23 SURGERY — EGD (ESOPHAGOGASTRODUODENOSCOPY)
Anesthesia: General

## 2022-08-23 MED ORDER — SODIUM CHLORIDE 0.9 % IV SOLN
INTRAVENOUS | Status: DC
Start: 2022-08-23 — End: 2022-08-23
  Administered 2022-08-23: 20 mL/h via INTRAVENOUS

## 2022-08-23 MED ORDER — PROPOFOL 10 MG/ML IV BOLUS
INTRAVENOUS | Status: DC | PRN
  Administered 2022-08-23: 40 mg via INTRAVENOUS

## 2022-08-23 MED ORDER — DEXMEDETOMIDINE HCL IN NACL 200 MCG/50ML IV SOLN
INTRAVENOUS | Status: DC | PRN
  Administered 2022-08-23: 8 ug via INTRAVENOUS

## 2022-08-23 MED ORDER — LIDOCAINE HCL (CARDIAC) PF 100 MG/5ML IV SOSY
PREFILLED_SYRINGE | INTRAVENOUS | Status: DC | PRN
  Administered 2022-08-23: 40 mg via INTRAVENOUS

## 2022-08-23 MED ORDER — PROPOFOL 500 MG/50ML IV EMUL
INTRAVENOUS | Status: DC | PRN
  Administered 2022-08-23: 150 ug/kg/min via INTRAVENOUS

## 2022-08-23 NOTE — Op Note (Signed)
[facility] Gastroenterology Patient Name: Jonathan [name] Procedure Date: 08/23/2022 12:31 PM MRN: [id number] Account #: [id number] Date of Birth: 05-Jul-1956 Admit Type: Outpatient Age: 67 Room: [facility] ENDO ROOM 1 Gender: Male Note Status: Finalized Instrument Name: Upper Endoscope [id number] Procedure:             Upper GI endoscopy Indications:           Abnormal cine-esophagram Providers:             Rueben Bash, DO Referring MD:          Irven Easterly. Kary Kos, MD (Referring MD) Medicines:             Monitored Anesthesia Care Complications:         No immediate complications. Estimated blood loss:                         Minimal. Procedure:             Pre-Anesthesia Assessment:                        - Prior to the procedure, a History and Physical was                         performed, and patient medications and allergies were                         reviewed. The patient is competent. The risks and                         benefits of the procedure and the sedation options and                         risks were discussed with the patient. All questions                         were answered and informed consent was obtained.                         Patient identification and proposed procedure were                         verified by the physician, the nurse, the anesthetist                         and the technician in the endoscopy suite. Mental                         Status Examination: alert and oriented. Airway                         Examination: normal oropharyngeal airway and neck                         mobility. Respiratory Examination: clear to                         auscultation. CV Examination: RRR, no murmurs, no S3  or S4. Prophylactic Antibiotics: The patient does not                         require prophylactic antibiotics. Prior                         Anticoagulants: The patient has taken no anticoagulant                          or antiplatelet agents. ASA Grade Assessment: II - A                         patient with mild systemic disease. After reviewing                         the risks and benefits, the patient was deemed in                         satisfactory condition to undergo the procedure. The                         anesthesia plan was to use monitored anesthesia care                         (MAC). Immediately prior to administration of                         medications, the patient was re-assessed for adequacy                         to receive sedatives. The heart rate, respiratory                         rate, oxygen saturations, blood pressure, adequacy of                         pulmonary ventilation, and response to care were                         monitored throughout the procedure. The physical                         status of the patient was re-assessed after the                         procedure.                        After obtaining informed consent, the endoscope was                         passed under direct vision. Throughout the procedure,                         the patient's blood pressure, pulse, and oxygen                         saturations were monitored continuously. The  Endosonoscope was introduced through the mouth, and                         advanced to the second part of duodenum. The upper GI                         endoscopy was accomplished without difficulty. The                         patient tolerated the procedure well. Findings:      The duodenal bulb, first portion of the duodenum and second portion of       the duodenum were normal. Estimated blood loss: none.      Localized mild inflammation characterized by erosions was found in the       gastric antrum. Biopsies were taken with a cold forceps for Helicobacter       pylori testing. Estimated blood loss was minimal.      The exam of the stomach was otherwise normal.       A large, fungating and ulcerating mass with bleeding and no stigmata of       recent bleeding was found in the lower third of the esophagus, 27 cm       from the incisors. The mass was partially obstructing and       circumferential. Biopsies were taken with a cold forceps for histology.       Circumferential mass extending from 27 to 39 cm from incisors.      Esophagogastric landmarks were identified: the gastroesophageal junction       was found at 39 cm from the incisors.      Esophagus proximal to mass appears normal, Mass does not appear to       invade the cardia Impression:            - Normal duodenal bulb, first portion of the duodenum                         and second portion of the duodenum.                        - Gastritis. Biopsied.                        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                         Biopsied.                        - Esophagogastric landmarks identified. Recommendation:        - Patient has a contact number available for                         emergencies. The signs and symptoms of potential                         delayed complications were discussed with the patient.  Return to normal activities tomorrow. Written                         discharge instructions were provided to the patient.                        - Discharge patient to home.                        - Full liquid diet and soft diet.                        - No aspirin, ibuprofen, naproxen, or other                         non-steroidal anti-inflammatory drugs after biopsy.                        - Continue present medications.                        - Await pathology results.                        - Refer to an oncologist at appointment to be                         scheduled.                        - Return to GI clinic as previously scheduled.                         - The findings and recommendations were discussed with                         the patient.                        - The findings and recommendations were discussed with                         the patient's family.                        - Will arrange for further imaging and testing. our                         office will be in contact Procedure Code(s):     --- Professional ---                        [phone], Esophagogastroduodenoscopy, flexible,                         transoral; with biopsy, single or multiple Diagnosis Code(s):     --- Professional ---                        K29.70, Gastritis, unspecified, without bleeding                        C15.5, Malignant  neoplasm of lower third of esophagus                        R93.3, Abnormal findings on diagnostic imaging of                         other parts of digestive tract CPT copyright 2022 American [employer] Association. All rights reserved. The codes documented in this report are preliminary and upon coder review may  be revised to meet current compliance requirements. Attending Participation:      I personally performed the entire procedure. Volney American, DO Annamaria Helling DO, DO 08/23/2022 1:22:31 PM This report has been signed electronically. Number of Addenda: 0 Note Initiated On: 08/23/2022 12:31 PM Estimated Blood Loss:  Estimated blood loss was minimal.      [facility]

## 2022-08-23 NOTE — Anesthesia Procedure Notes (Signed)
Date/Time: 08/23/2022 12:50 PM  Performed by: Doreen Salvage, CRNAPre-anesthesia Checklist: Patient identified, Emergency Drugs available, Suction available and Patient being monitored Patient Re-evaluated:Patient Re-evaluated prior to induction Oxygen Delivery Method: Nasal cannula Induction Type: IV induction Dental Injury: Teeth and Oropharynx as per pre-operative assessment  Comments: Nasal cannula with etCO2 monitoring

## 2022-08-23 NOTE — Transfer of Care (Signed)
Immediate Anesthesia Transfer of Care Note  Patient: Jonathan [name]  Procedure(s) Performed: Procedure(s): ESOPHAGOGASTRODUODENOSCOPY (EGD) (N/A)  Patient Location: PACU and Endoscopy Unit  Anesthesia Type:General  Level of Consciousness: sedated  Airway & Oxygen Therapy: Patient Spontanous Breathing and Patient connected to nasal cannula oxygen  Post-op Assessment: Report given to RN and Post -op Vital signs reviewed and stable  Post vital signs: Reviewed and stable  Last Vitals:  Vitals:   08/23/22 1205 08/23/22 1313  BP: (!) 141/87 97/63  Pulse: 71 73  Resp: 20 20  Temp: (!) 36.3 C 36.4 C  SpO2: 56% 70%    Complications: No apparent anesthesia complications

## 2022-08-23 NOTE — Anesthesia Preprocedure Evaluation (Signed)
Anesthesia Evaluation  Patient identified by MRN, date of birth, ID band Patient awake    Reviewed: Allergy & Precautions, NPO status , Patient's Chart, lab work & pertinent test results  History of Anesthesia Complications (+) PONV and history of anesthetic complications  Airway Mallampati: III  TM Distance: >3 FB Neck ROM: full    Dental no notable dental hx.    Pulmonary neg pulmonary ROS   Pulmonary exam normal        Cardiovascular hypertension, On Medications and On Home Beta Blockers negative cardio ROS Normal cardiovascular exam     Neuro/Psych negative neurological ROS  negative psych ROS   GI/Hepatic negative GI ROS, Neg liver ROS,,,dysphagia   Endo/Other  Hypothyroidism    Renal/GU negative Renal ROS  negative genitourinary   Musculoskeletal   Abdominal   Peds  Hematology negative hematology ROS (+)   Anesthesia Other Findings Past Medical History: No date: Arthritis No date: Complication of anesthesia     Comment:  OCCURRED ONCE YEARS AGO 1981 No date: Hypertension No date: Hypothyroidism No date: PONV (postoperative nausea and vomiting)  Past Surgical History: No date: COLONOSCOPY No date: FINGER SURGERY No date: TENDON REPAIR IN LEFT KNEE     Reproductive/Obstetrics negative OB ROS                              Anesthesia Physical Anesthesia Plan  ASA: 3  Anesthesia Plan: General   Post-op Pain Management: Minimal or no pain anticipated   Induction: Intravenous  PONV Risk Score and Plan: Propofol infusion and TIVA  Airway Management Planned: Natural Airway and Nasal Cannula  Additional Equipment:   Intra-op Plan:   Post-operative Plan:   Informed Consent: I have reviewed the patients History and Physical, chart, labs and discussed the procedure including the risks, benefits and alternatives for the proposed anesthesia with the patient or  authorized representative who has indicated his/her understanding and acceptance.     Dental Advisory Given  Plan Discussed with: Anesthesiologist, CRNA and Surgeon  Anesthesia Plan Comments: (Patient consented for risks of anesthesia including but not limited to:  - adverse reactions to medications - risk of airway placement if required - damage to eyes, teeth, lips or other oral mucosa - nerve damage due to positioning  - sore throat or hoarseness - Damage to heart, brain, nerves, lungs, other parts of body or loss of life  Patient voiced understanding.)        Anesthesia Quick Evaluation  

## 2022-08-23 NOTE — Interval H&P Note (Signed)
History and Physical Interval Note: Preprocedure H&P from 08/23/22  was reviewed and there was no interval change after seeing and examining the patient.  Written consent was obtained from the patient after discussion of risks, benefits, and alternatives. Patient has consented to proceed with Esophagogastroduodenoscopy with possible intervention   08/23/2022 12:36 PM  Jonathan [name]  has presented today for surgery, with the diagnosis of Esophageal dysphagia (R13.19) Abnormal barium swallow (R93.3).  The various methods of treatment have been discussed with the patient and family. After consideration of risks, benefits and other options for treatment, the patient has consented to  Procedure(s): ESOPHAGOGASTRODUODENOSCOPY (EGD) (N/A) as a surgical intervention.  The patient's history has been reviewed, patient examined, no change in status, stable for surgery.  I have reviewed the patient's chart and labs.  Questions were answered to the patient's satisfaction.     Annamaria Helling

## 2022-08-23 NOTE — Anesthesia Postprocedure Evaluation (Signed)
Anesthesia Post Note  Patient: Jonathan [name]  Procedure(s) Performed: ESOPHAGOGASTRODUODENOSCOPY (EGD)  Patient location during evaluation: Endoscopy Anesthesia Type: General Level of consciousness: awake and alert Pain management: pain level controlled Vital Signs Assessment: post-procedure vital signs reviewed and stable Respiratory status: spontaneous breathing, nonlabored ventilation, respiratory function stable and patient connected to nasal cannula oxygen Cardiovascular status: blood pressure returned to baseline and stable Postop Assessment: no apparent nausea or vomiting Anesthetic complications: no   No notable events documented.   Last Vitals:  Vitals:   08/23/22 1205 08/23/22 1313  BP: (!) 141/87 97/63  Pulse: 71 73  Resp: 20 20  Temp: (!) 36.3 C 36.4 C  SpO2: 99% 95%    Last Pain:  Vitals:   08/23/22 1313  TempSrc: Temporal  PainSc: 0-No pain                 Ilene Qua

## 2022-08-26 ENCOUNTER — Ambulatory Visit
Admission: RE | Admit: 2022-08-26 | Discharge: 2022-08-26 | Disposition: A | Discharge status: Home / Self Care | Payer: No Typology Code available for payment source | Source: Ambulatory Visit | Attending: Gastroenterology | Admitting: Gastroenterology

## 2022-08-26 ENCOUNTER — Encounter: Payer: Self-pay | Admitting: Gastroenterology

## 2022-08-26 DIAGNOSIS — N200 Calculus of kidney: Secondary | ICD-10-CM | POA: Diagnosis not present

## 2022-08-26 DIAGNOSIS — K449 Diaphragmatic hernia without obstruction or gangrene: Secondary | ICD-10-CM | POA: Diagnosis not present

## 2022-08-26 DIAGNOSIS — R1319 Other dysphagia: Secondary | ICD-10-CM | POA: Diagnosis not present

## 2022-08-26 DIAGNOSIS — K8020 Calculus of gallbladder without cholecystitis without obstruction: Secondary | ICD-10-CM | POA: Diagnosis not present

## 2022-08-26 DIAGNOSIS — I700 Atherosclerosis of aorta: Secondary | ICD-10-CM | POA: Insufficient documentation

## 2022-08-26 DIAGNOSIS — J984 Other disorders of lung: Secondary | ICD-10-CM | POA: Diagnosis not present

## 2022-08-26 DIAGNOSIS — K760 Fatty (change of) liver, not elsewhere classified: Secondary | ICD-10-CM | POA: Diagnosis not present

## 2022-08-26 DIAGNOSIS — K2289 Other specified disease of esophagus: Secondary | ICD-10-CM | POA: Diagnosis not present

## 2022-08-26 DIAGNOSIS — I2510 Atherosclerotic heart disease of native coronary artery without angina pectoris: Secondary | ICD-10-CM | POA: Insufficient documentation

## 2022-08-26 DIAGNOSIS — I771 Stricture of artery: Secondary | ICD-10-CM | POA: Diagnosis not present

## 2022-08-26 DIAGNOSIS — C159 Malignant neoplasm of esophagus, unspecified: Secondary | ICD-10-CM | POA: Diagnosis not present

## 2022-08-26 MED ORDER — IOHEXOL 300 MG/ML  SOLN
100.0000 mL | Freq: Once | INTRAMUSCULAR | Status: AC | PRN
  Administered 2022-08-26: 100 mL via INTRAVENOUS

## 2022-08-27 ENCOUNTER — Encounter: Payer: Self-pay | Admitting: Oncology

## 2022-08-27 ENCOUNTER — Telehealth: Payer: Self-pay

## 2022-08-27 NOTE — Telephone Encounter (Signed)
Call made to new patient. Chart reviewed and updated. Medication reconciliation completed. Referral reason, location directions and appt details given to pt. Visitor and mask policy also addressed with pt. All questions answered.

## 2022-08-28 ENCOUNTER — Inpatient Hospital Stay: Payer: No Typology Code available for payment source

## 2022-08-28 ENCOUNTER — Other Ambulatory Visit: Payer: Self-pay

## 2022-08-28 ENCOUNTER — Inpatient Hospital Stay: Payer: No Typology Code available for payment source | Attending: Oncology | Admitting: Oncology

## 2022-08-28 VITALS — BP 152/92 | HR 70 | Temp 98.1°F | Resp 18 | Ht 69.0 in | Wt 265.2 lb

## 2022-08-28 DIAGNOSIS — I2510 Atherosclerotic heart disease of native coronary artery without angina pectoris: Secondary | ICD-10-CM | POA: Diagnosis not present

## 2022-08-28 DIAGNOSIS — C7800 Secondary malignant neoplasm of unspecified lung: Secondary | ICD-10-CM | POA: Diagnosis not present

## 2022-08-28 DIAGNOSIS — Z803 Family history of malignant neoplasm of breast: Secondary | ICD-10-CM | POA: Diagnosis not present

## 2022-08-28 DIAGNOSIS — C159 Malignant neoplasm of esophagus, unspecified: Secondary | ICD-10-CM | POA: Insufficient documentation

## 2022-08-28 DIAGNOSIS — R634 Abnormal weight loss: Secondary | ICD-10-CM | POA: Diagnosis not present

## 2022-08-28 DIAGNOSIS — Z8249 Family history of ischemic heart disease and other diseases of the circulatory system: Secondary | ICD-10-CM | POA: Diagnosis not present

## 2022-08-28 DIAGNOSIS — K760 Fatty (change of) liver, not elsewhere classified: Secondary | ICD-10-CM | POA: Diagnosis not present

## 2022-08-28 DIAGNOSIS — K2970 Gastritis, unspecified, without bleeding: Secondary | ICD-10-CM | POA: Insufficient documentation

## 2022-08-28 DIAGNOSIS — Z8042 Family history of malignant neoplasm of prostate: Secondary | ICD-10-CM | POA: Diagnosis not present

## 2022-08-28 DIAGNOSIS — I700 Atherosclerosis of aorta: Secondary | ICD-10-CM | POA: Diagnosis not present

## 2022-08-28 DIAGNOSIS — E876 Hypokalemia: Secondary | ICD-10-CM | POA: Insufficient documentation

## 2022-08-28 DIAGNOSIS — Z79899 Other long term (current) drug therapy: Secondary | ICD-10-CM | POA: Insufficient documentation

## 2022-08-28 DIAGNOSIS — K8020 Calculus of gallbladder without cholecystitis without obstruction: Secondary | ICD-10-CM | POA: Diagnosis not present

## 2022-08-28 DIAGNOSIS — N200 Calculus of kidney: Secondary | ICD-10-CM | POA: Diagnosis not present

## 2022-08-28 DIAGNOSIS — Z7189 Other specified counseling: Secondary | ICD-10-CM | POA: Insufficient documentation

## 2022-08-28 DIAGNOSIS — C155 Malignant neoplasm of lower third of esophagus: Secondary | ICD-10-CM | POA: Diagnosis not present

## 2022-08-28 LAB — CBC WITH DIFFERENTIAL/PLATELET
Abs Immature Granulocytes: 0.03 10*3/uL (ref 0.00–0.07)
Basophils Absolute: 0.1 10*3/uL (ref 0.0–0.1)
Basophils Relative: 1 %
Eosinophils Absolute: 0.1 10*3/uL (ref 0.0–0.5)
Eosinophils Relative: 1 %
HCT: 42.6 % (ref 39.0–52.0)
Hemoglobin: 14 g/dL (ref 13.0–17.0)
Immature Granulocytes: 0 %
Lymphocytes Relative: 24 %
Lymphs Abs: 2.2 10*3/uL (ref 0.7–4.0)
MCH: 28 pg (ref 26.0–34.0)
MCHC: 32.9 g/dL (ref 30.0–36.0)
MCV: 85.2 fL (ref 80.0–100.0)
Monocytes Absolute: 0.9 10*3/uL (ref 0.1–1.0)
Monocytes Relative: 9 %
Neutro Abs: 6.1 10*3/uL (ref 1.7–7.7)
Neutrophils Relative %: 65 %
Platelets: 369 10*3/uL (ref 150–400)
RBC: 5 MIL/uL (ref 4.22–5.81)
RDW: 13.4 % (ref 11.5–15.5)
WBC: 9.4 10*3/uL (ref 4.0–10.5)
nRBC: 0 % (ref 0.0–0.2)

## 2022-08-28 LAB — COMPREHENSIVE METABOLIC PANEL
ALT: 46 U/L — ABNORMAL HIGH (ref 0–44)
AST: 34 U/L (ref 15–41)
Albumin: 4.2 g/dL (ref 3.5–5.0)
Alkaline Phosphatase: 43 U/L (ref 38–126)
Anion gap: 12 (ref 5–15)
BUN: 10 mg/dL (ref 8–23)
CO2: 27 mmol/L (ref 22–32)
Calcium: 9.3 mg/dL (ref 8.9–10.3)
Chloride: 99 mmol/L (ref 98–111)
Creatinine, Ser: 1.12 mg/dL (ref 0.61–1.24)
GFR, Estimated: >60 mL/min (ref >60)
Glucose, Bld: 100 mg/dL — ABNORMAL HIGH (ref 70–99)
Potassium: 2.9 mmol/L — ABNORMAL LOW (ref 3.5–5.1)
Sodium: 138 mmol/L (ref 135–145)
Total Bilirubin: 0.5 mg/dL (ref 0.3–1.2)
Total Protein: 7.9 g/dL (ref 6.5–8.1)

## 2022-08-28 MED ORDER — POTASSIUM CHLORIDE CRYS ER 20 MEQ PO TBCR: 20.0000 meq | EXTENDED_RELEASE_TABLET | Freq: Every day | ORAL | 0 refills | Status: DC

## 2022-08-28 NOTE — Assessment & Plan Note (Addendum)
EGD findings, pathology reports CT findings were reviewed and discussed with patient. 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  Gastrohepatic ligament node with a possible regional metastasis. I recommend PET as well as EUS for further staging. Assuming no distant metastasis is found on PET scan, I recommend concurrent chemotherapy with radiation +/- surgery.   plan will be finalized once we get PET scan and EUS results. Check CBC, CMP, CEA.

## 2022-08-28 NOTE — Progress Notes (Signed)
EUS scheduled for 09/12/2022. Provided education and instructions for the procedure. Denies taking anticoagulants. Denies diabetes. Provided printed copy of instructions for EUS. Encouraged to call with any questions.

## 2022-08-28 NOTE — Assessment & Plan Note (Signed)
Potassium 2.9.  Recommend patient to start potassium supplementation.

## 2022-08-28 NOTE — H&P (View-Only) (Signed)
Hematology/Oncology Consult Note [phone] [phone]     REFERRING PROVIDER: Maryland Pink, MD  CHIEF COMPLAINTS/PURPOSE OF CONSULTATION:  Esophageal adenocarcinoma  ASSESSMENT & PLAN:   Adenocarcinoma of esophagus ([place]) EGD findings, pathology reports CT findings were reviewed and discussed with patient. 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  Gastrohepatic ligament node with a possible regional metastasis. I recommend PET as well as EUS for further staging. Assuming no distant metastasis is found on PET scan, I recommend concurrent chemotherapy with radiation +/- surgery.   plan will be finalized once we get PET scan and EUS results. Check CBC, CMP, CEA.  Goals of care, counseling/discussion Discussed with patient  Hypokalemia Potassium 2.9.  Recommend patient to start potassium supplementation.   Orders Placed This Encounter  Procedures   NM PET Image Initial (PI) Skull Base To Thigh    Standing Status:   Future    Standing Expiration Date:   08/28/2023    Order Specific Question:   If indicated for the ordered procedure, I authorize the administration of a radiopharmaceutical per Radiology protocol    Answer:   Yes    Order Specific Question:   Preferred imaging location?    Answer:   [facility]   CBC with Differential/Platelet    Standing Status:   Future    Number of Occurrences:   1    Standing Expiration Date:   08/29/2023   CEA    Standing Status:   Future    Number of Occurrences:   1    Standing Expiration Date:   08/28/2023   Comprehensive metabolic panel    Standing Status:   Future    Number of Occurrences:   1    Standing Expiration Date:   08/28/2023   Follow-up to be determined. All questions were answered. The patient knows to call the clinic with any problems, questions or concerns.  Earlie Server, MD, PhD [facility] Hematology Oncology 08/28/2022   HISTORY OF PRESENTING ILLNESS:  Jonathan [name] 67 y.o. male presents to establish care for esophageal adenocarcinoma I have reviewed his chart and materials related to his cancer extensively and collaborated history with the patient. Summary of oncologic history is as follows: Oncology History  Adenocarcinoma of esophagus ([place])  08/28/2022 Initial Diagnosis   Adenocarcinoma of esophagus   -Patient has noticed worsening of "food stuck/fullness" sensation since November 2023.  Patient had a barium swallow study which commented on marked mucosal irregularity in the distal esophagus with Broaddus base mural filling defect highly suspicious for malignancy.  Patient establish care with gastroenterology. -08/23/2022, EGD showed gastritis and partially obstructing malignant esophageal tumor in the lower third of the esophagus. Esophagus mass biopsy showed adenocarcinoma. Stomach biopsy showed gastric mucosa with no specific histology abnormality.  No significant intestinal metaplastic, dysplastic, granular atrophy or increased inflammation.     08/28/2022 Cancer Staging   Staging form: Esophagus - Adenocarcinoma, AJCC 8th Edition - Clinical stage from 08/28/2022: Stage Unknown (cTX, cN1, cM0) - Signed by Earlie Server, MD on 08/28/2022 Stage prefix: Initial diagnosis   08/28/2022 Imaging   CT chest abdomen pelvis with contrast showed 1. Distal esophageal primary with gastrohepatic ligament nodal metastasis. 2. 2 right-sided pulmonary nodules, the largest of which measures 5 mm and is new since 2015. Pulmonary metastasis not be excluded. 3. Anterior right lower lobe volume loss and minimal soft tissue density, favoring atelectasis or scar. Recommend attention on follow-up. 4. Hepatic steatosis 5. Cholelithiasis 6. Left nephrolithiasis 7. Coronary  artery atherosclerosis. Aortic Atherosclerosis   Patient presents to establish care.  He is not taking PPI. He has intentionally lost some weight. Family history positive for father and paternal  uncle with prostate cancer and sister with breast cancer. Denies any routine alcohol use  Never smoker.   MEDICAL HISTORY:  Past Medical History:  Diagnosis Date   Arthritis    Complication of anesthesia    OCCURRED ONCE YEARS AGO 1981   Hypertension    Hypothyroidism    PONV (postoperative nausea and vomiting)     SURGICAL HISTORY: Past Surgical History:  Procedure Laterality Date   COLONOSCOPY     ESOPHAGOGASTRODUODENOSCOPY N/A 08/23/2022   Procedure: ESOPHAGOGASTRODUODENOSCOPY (EGD);  Surgeon: Annamaria Helling, DO;  Location: [facility] ENDOSCOPY;  Service: Gastroenterology;  Laterality: N/A;   FINGER SURGERY     TENDON REPAIR IN LEFT KNEE      SOCIAL HISTORY: Social History   Socioeconomic History   Marital status: Single    Spouse name: Not on file   Number of children: Not on file   Years of education: Not on file   Highest education level: Not on file  Occupational History   Not on file  Tobacco Use   Smoking status: Never   Smokeless tobacco: Never  Vaping Use   Vaping Use: Never used  Substance and Sexual Activity   Alcohol use: Yes    Comment: OCCASIONALLY   Drug use: Never   Sexual activity: Not on file  Other Topics Concern   Not on file  Social History Narrative   Not on file   Social Determinants of Health   Financial Resource Strain: Not on file  Food Insecurity: No Food Insecurity (08/28/2022)   Hunger Vital Sign    Worried About Running Out of Food in the Last Year: Never true    Ran Out of Food in the Last Year: Never true  Transportation Needs: No Transportation Needs (08/28/2022)   PRAPARE - Hydrologist (Medical): No    Lack of Transportation (Non-Medical): No  Physical Activity: Not on file  Stress: Not on file  Social Connections: Not on file  Intimate Partner Violence: Not At Risk (08/28/2022)   Humiliation, Afraid, Rape, and Kick questionnaire    Fear of Current or Ex-Partner: No    Emotionally  Abused: No    Physically Abused: No    Sexually Abused: No    FAMILY HISTORY: Family History  Problem Relation Age of Onset   Heart attack Mother    Prostate cancer Father    Breast cancer Sister    Prostate cancer Paternal Uncle     ALLERGIES:  has No Known Allergies.  MEDICATIONS:  Current Outpatient Medications  Medication Sig Dispense Refill   acetaminophen (TYLENOL) 650 MG CR tablet Take 1,300 mg by mouth every 8 (eight) hours as needed for pain.     amLODipine (NORVASC) 5 MG tablet Take 5 mg by mouth daily.     atenolol (TENORMIN) 50 MG tablet Take 50 mg by mouth daily.     Cholecalciferol (VITAMIN D3) 125 MCG (5000 UT) CAPS Take 5,000 Units by mouth daily.     hydrochlorothiazide (HYDRODIURIL) 25 MG tablet Take 25 mg by mouth daily.     levothyroxine (SYNTHROID) 100 MCG tablet Take 100 mcg by mouth daily before breakfast.     Multiple Vitamins-Minerals (MULTIVITAMIN WITH MINERALS) tablet Take 1 tablet by mouth daily.     omeprazole (PRILOSEC) 20 MG capsule  Take 20 mg by mouth daily.     Turmeric 500 MG CAPS Take 500 mg by mouth daily.     vitamin C (ASCORBIC ACID) 500 MG tablet Take 500 mg by mouth daily.     No current facility-administered medications for this visit.    Review of Systems  Constitutional:  Negative for appetite change, chills, fatigue, fever and unexpected weight change.  HENT:   Negative for hearing loss and voice change.   Eyes:  Negative for eye problems and icterus.  Respiratory:  Negative for chest tightness, cough and shortness of breath.   Cardiovascular:  Negative for chest pain and leg swelling.  Gastrointestinal:  Negative for abdominal distention and abdominal pain.       Dysphagia/epigastric fullness  Endocrine: Negative for hot flashes.  Genitourinary:  Negative for difficulty urinating, dysuria and frequency.   Musculoskeletal:  Negative for arthralgias.  Skin:  Negative for itching and rash.  Neurological:  Negative for  light-headedness and numbness.  Hematological:  Negative for adenopathy. Does not bruise/bleed easily.  Psychiatric/Behavioral:  Negative for confusion.      PHYSICAL EXAMINATION: ECOG PERFORMANCE STATUS: 0 - Asymptomatic  Vitals:   08/28/22 0943  BP: (!) 152/92  Pulse: 70  Resp: 18  Temp: 98.1 F (36.7 C)   Filed Weights   08/28/22 0943  Weight: 265 lb 3.2 oz (120.3 kg)    Physical Exam Constitutional:      General: He is not in acute distress.    Appearance: He is obese. He is not diaphoretic.  HENT:     Head: Normocephalic and atraumatic.     Nose: Nose normal.     Mouth/Throat:     Pharynx: No oropharyngeal exudate.  Eyes:     General: No scleral icterus.    Pupils: Pupils are equal, round, and reactive to light.  Cardiovascular:     Rate and Rhythm: Normal rate and regular rhythm.     Heart sounds: No murmur heard. Pulmonary:     Effort: Pulmonary effort is normal. No respiratory distress.     Breath sounds: No rales.  Chest:     Chest wall: No tenderness.  Abdominal:     General: There is no distension.     Palpations: Abdomen is soft.     Tenderness: There is no abdominal tenderness.  Musculoskeletal:        General: Normal range of motion.     Cervical back: Normal range of motion and neck supple.  Skin:    General: Skin is warm and dry.     Findings: No erythema.  Neurological:     Mental Status: He is alert and oriented to person, place, and time.     Cranial Nerves: No cranial nerve deficit.     Motor: No abnormal muscle tone.     Coordination: Coordination normal.  Psychiatric:        Mood and Affect: Affect normal.      LABORATORY DATA:  I have reviewed the data as listed    Latest Ref Rng & Units 08/28/2022   10:45 AM 09/01/2020    8:49 AM  CBC  WBC 4.0 - 10.5 K/uL 9.4  6.0   Hemoglobin 13.0 - 17.0 g/dL 14.0  14.5   Hematocrit 39.0 - 52.0 % 42.6  44.2   Platelets 150 - 400 K/uL 369  218       Latest Ref Rng & Units 08/28/2022    10:45 AM 09/01/2020  8:49 AM  CMP  Glucose 70 - 99 mg/dL 100  120   BUN 8 - 23 mg/dL 10  17   Creatinine 0.61 - 1.24 mg/dL 1.12  1.01   Sodium 135 - 145 mmol/L 138  140   Potassium 3.5 - 5.1 mmol/L 2.9  2.5   Chloride 98 - 111 mmol/L 99  100   CO2 22 - 32 mmol/L 27  26   Calcium 8.9 - 10.3 mg/dL 9.3  9.4   Total Protein 6.5 - 8.1 g/dL 7.9  7.3   Total Bilirubin 0.3 - 1.2 mg/dL 0.5  0.7   Alkaline Phos 38 - 126 U/L 43  32   AST 15 - 41 U/L 34  73   ALT 0 - 44 U/L 46  131      RADIOGRAPHIC STUDIES: I have personally reviewed the radiological images as listed and agreed with the findings in the report. CT CHEST ABDOMEN PELVIS W CONTRAST  Result Date: 08/27/2022 CLINICAL DATA:  Dysphagia. Lower esophageal tumor on endoscopy. * Tracking Code: BO * EXAM: CT CHEST, ABDOMEN, AND PELVIS WITH CONTRAST TECHNIQUE: Multidetector CT imaging of the chest, abdomen and pelvis was performed following the standard protocol during bolus administration of intravenous contrast. RADIATION DOSE REDUCTION: This exam was performed according to the departmental dose-optimization program which includes automated exposure control, adjustment of the mA and/or kV according to patient size and/or use of iterative reconstruction technique. CONTRAST:  157m OMNIPAQUE IOHEXOL 300 MG/ML  SOLN COMPARISON:  Esophagram 08/15/2022. Clinic note of 08/21/2022. endoscopy report of 08/23/2022. 03/07/2021 abdominal ultrasound. Abdominopelvic CT 03/08/2014. FINDINGS: CT CHEST FINDINGS Cardiovascular: Aortic atherosclerosis. Tortuous thoracic aorta. Normal heart size, without pericardial effusion. Lad coronary artery calcification. Mediastinum/Nodes: No mediastinal or hilar adenopathy. Tiny hiatal hernia. The esophageal primary is identified as an area of wall thickening and mucosal hyperenhancement, beginning just below the carina and terminating just above the GE junction. Example 36/3. No significant obstruction. Lungs/Pleura: No  pleural fluid. Mild anterior right lower lobe volume with indeterminate soft tissue density on 111/4. Subpleural 2 mm right middle lobe pulmonary nodule on 87/4. A 5 mm right lower lobe pulmonary nodule on 108/4 is new since 2015 abdominal CT. Musculoskeletal: No acute osseous abnormality. CT ABDOMEN PELVIS FINDINGS Hepatobiliary: Mild hepatic steatosis, without suspicious liver lesion. 4 mm gallstone without acute cholecystitis or biliary duct dilatation. Pancreas: Normal, without mass or ductal dilatation. Spleen: Normal in size, without focal abnormality. Adrenals/Urinary Tract: Normal adrenal glands. 4 mm interpolar left renal collecting system calculus. Interpolar left renal 1.6 cm cyst. Normal right kidney. Involution of previous dominant interpolar left renal cyst or minimally complex cyst posteriorly including at 2.9 cm on 25/10 . In the absence of clinically indicated signs/symptoms require(s) no independent follow-up. No hydronephrosis. Normal urinary bladder. Stomach/Bowel: Normal distal stomach. Scattered colonic diverticula. Normal terminal ileum. Favor dense contrast in the appendix on 86/3. Metallic foreign body felt less likely, especially given recent esophagram. Normal small bowel. Vascular/Lymphatic: Separate origins of the splenic and common hepatic arteries. Aortic atherosclerosis. Gastrohepatic ligament adenopathy, including at 1.5 x 1.5 cm on 48/3. More caudal gastrohepatic ligament node of 9 mm on 55/3. No pelvic sidewall adenopathy. Reproductive: Normal prostate. Other: No significant free fluid. Tiny fat containing right inguinal hernia. No evidence of omental or peritoneal disease. Musculoskeletal: Posterior right acetabular sclerotic lesion of 6 mm is similar to the prior and presumably a bone island. Lumbosacral spondylosis. IMPRESSION: 1. Distal esophageal primary with gastrohepatic ligament nodal metastasis. 2. 2  right-sided pulmonary nodules, the largest of which measures 5 mm and is  new since 2015. Pulmonary metastasis not be excluded. Consider CT follow-up at 3 months. 3. Anterior right lower lobe volume loss and minimal soft tissue density, favoring atelectasis or scar. Recommend attention on follow-up. 4. Hepatic steatosis 5. Cholelithiasis 6. Left nephrolithiasis 7. Coronary artery atherosclerosis. Aortic Atherosclerosis (ICD10-I70.0). Electronically Signed   By: Abigail Miyamoto M.D.   On: 08/27/2022 10:15   DG ESOPHAGUS W DOUBLE CM (HD)  Result Date: 08/15/2022 INDICATION: Patient complains of intermittent dysphagia with midsternal chest discomfort with eating solid foods only. No prior pertinent surgeries. EXAM: ESOPHAGUS/BARIUM SWALLOW/TABLET STUDY TECHNIQUE: Combined double and single contrast examination was performed using effervescent crystals, high-density barium, and thin liquid barium. The patient was observed with fluoroscopy swallowing a 13 mm barium sulphate tablet. This exam was performed by Tsosie Billing, and was supervised and interpreted by Valetta Mole. FLUOROSCOPY: Radiation Exposure Index (as provided by the fluoroscopic device): 86.70 mGy COMPARISON:  None Available. FINDINGS: Swallowing: Appears normal. No vestibular penetration or aspiration seen. Pharynx: Unremarkable. Esophagus: There is marked mucosal irregularity in the distal esophagus with areas of ulceration. There is a broad-based mural based filling defect along the posterior esophageal wall measuring proximally 2.3 cm in length (best appreciated on the last full exposure image and the last cine sequence). Esophageal motility: Esophageal motility in the proximal 2/3 appears within normal limits. There is abnormal motility in the distal third. Hiatal Hernia: Small hiatal hernia. Gastroesophageal reflux: Moderate spontaneous gastroesophageal reflux was seen extending to the midesophagus. Ingested 24m barium tablet: Passed without delay. Other: None. IMPRESSION: 1. Marked mucosal irregularity in the distal  esophagus with broad-based mural filling defect highly suspicious for malignancy. Recommend correlation with endoscopy. 2. Small hiatal hernia. 3. Moderate amount of spontaneous gastroesophageal reflux was seen extending to the mid esophagus. This exam was performed by KTsosie BillingPA-C, and was supervised and interpreted by Dr. NQuintella Baton These results will be called to the ordering clinician or representative by the Radiologist Assistant, and communication documented in the PACS or [employer] Electronically Signed   By: PValetta MoleM.D.   On: 08/15/2022 11:21

## 2022-08-28 NOTE — Progress Notes (Signed)
Pt here to establish care.

## 2022-08-28 NOTE — Progress Notes (Signed)
Hematology/Oncology Consult Note [phone] [phone]     REFERRING PROVIDER: Hedrick, James, MD  CHIEF COMPLAINTS/PURPOSE OF CONSULTATION:  Esophageal adenocarcinoma  ASSESSMENT & PLAN:   Adenocarcinoma of esophagus (HCC) EGD findings, pathology reports CT findings were reviewed and discussed with patient. 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  Gastrohepatic ligament node with a possible regional metastasis. I recommend PET as well as EUS for further staging. Assuming no distant metastasis is found on PET scan, I recommend concurrent chemotherapy with radiation +/- surgery.   plan will be finalized once we get PET scan and EUS results. Check CBC, CMP, CEA.  Goals of care, counseling/discussion Discussed with patient  Hypokalemia Potassium 2.9.  Recommend patient to start potassium supplementation.   Orders Placed This Encounter  Procedures   NM PET Image Initial (PI) Skull Base To Thigh    Standing Status:   Future    Standing Expiration Date:   08/28/2023    Order Specific Question:   If indicated for the ordered procedure, I authorize the administration of a radiopharmaceutical per Radiology protocol    Answer:   Yes    Order Specific Question:   Preferred imaging location?    Answer:   [facility]   CBC with Differential/Platelet    Standing Status:   Future    Number of Occurrences:   1    Standing Expiration Date:   08/29/2023   CEA    Standing Status:   Future    Number of Occurrences:   1    Standing Expiration Date:   08/28/2023   Comprehensive metabolic panel    Standing Status:   Future    Number of Occurrences:   1    Standing Expiration Date:   08/28/2023   Follow-up to be determined. All questions were answered. The patient knows to call the clinic with any problems, questions or concerns.  [name], MD, PhD [facility] 08/28/2022   HISTORY OF PRESENTING ILLNESS:  Jonathan [name]  Jonathan [name] 67 y.o. male presents to establish care for esophageal adenocarcinoma I have reviewed his chart and materials related to his cancer extensively and collaborated history with the patient. Summary of oncologic history is as follows: Oncology History  Adenocarcinoma of esophagus (HCC)  08/28/2022 Initial Diagnosis   Adenocarcinoma of esophagus   -Patient has noticed worsening of "food stuck/fullness" sensation since November 2023.  Patient had a barium swallow study which commented on marked mucosal irregularity in the distal esophagus with Broaddus base mural filling defect highly suspicious for malignancy.  Patient establish care with gastroenterology. -08/23/2022, EGD showed gastritis and partially obstructing malignant esophageal tumor in the lower third of the esophagus. Esophagus mass biopsy showed adenocarcinoma. Stomach biopsy showed gastric mucosa with no specific histology abnormality.  No significant intestinal metaplastic, dysplastic, granular atrophy or increased inflammation.     08/28/2022 Cancer Staging   Staging form: Esophagus - Adenocarcinoma, AJCC 8th Edition - Clinical stage from 08/28/2022: Stage Unknown (cTX, cN1, cM0) - Signed by Allure Greaser, MD on 08/28/2022 Stage prefix: Initial diagnosis   08/28/2022 Imaging   CT chest abdomen pelvis with contrast showed 1. Distal esophageal primary with gastrohepatic ligament nodal metastasis. 2. 2 right-sided pulmonary nodules, the largest of which measures 5 mm and is new since 2015. Pulmonary metastasis not be excluded. 3. Anterior right lower lobe volume loss and minimal soft tissue density, favoring atelectasis or scar. Recommend attention on follow-up. 4. Hepatic steatosis 5. Cholelithiasis 6. Left nephrolithiasis 7. Coronary   artery atherosclerosis. Aortic Atherosclerosis   Patient presents to establish care.  He is not taking PPI. He has intentionally lost some weight. Family history positive for father and paternal  uncle with prostate cancer and sister with breast cancer. Denies any routine alcohol use  Never smoker.   MEDICAL HISTORY:  Past Medical History:  Diagnosis Date   Arthritis    Complication of anesthesia    OCCURRED ONCE YEARS AGO 1981   Hypertension    Hypothyroidism    PONV (postoperative nausea and vomiting)     SURGICAL HISTORY: Past Surgical History:  Procedure Laterality Date   COLONOSCOPY     ESOPHAGOGASTRODUODENOSCOPY N/A 08/23/2022   Procedure: ESOPHAGOGASTRODUODENOSCOPY (EGD);  Surgeon: Russo, Steven Michael, DO;  Location: [facility] ENDOSCOPY;  Service: Gastroenterology;  Laterality: N/A;   FINGER SURGERY     TENDON REPAIR IN LEFT KNEE      SOCIAL HISTORY: Social History   Socioeconomic History   Marital status: Single    Spouse name: Not on file   Number of children: Not on file   Years of education: Not on file   Highest education level: Not on file  Occupational History   Not on file  Tobacco Use   Smoking status: Never   Smokeless tobacco: Never  Vaping Use   Vaping Use: Never used  Substance and Sexual Activity   Alcohol use: Yes    Comment: OCCASIONALLY   Drug use: Never   Sexual activity: Not on file  Other Topics Concern   Not on file  Social History Narrative   Not on file   Social Determinants of Health   Financial Resource Strain: Not on file  Food Insecurity: No Food Insecurity (08/28/2022)   Hunger Vital Sign    Worried About Running Out of Food in the Last Year: Never true    Ran Out of Food in the Last Year: Never true  Transportation Needs: No Transportation Needs (08/28/2022)   PRAPARE - Transportation    Lack of Transportation (Medical): No    Lack of Transportation (Non-Medical): No  Physical Activity: Not on file  Stress: Not on file  Social Connections: Not on file  Intimate Partner Violence: Not At Risk (08/28/2022)   Humiliation, Afraid, Rape, and Kick questionnaire    Fear of Current or Ex-Partner: No    Emotionally  Abused: No    Physically Abused: No    Sexually Abused: No    FAMILY HISTORY: Family History  Problem Relation Age of Onset   Heart attack Mother    Prostate cancer Father    Breast cancer Sister    Prostate cancer Paternal Uncle     ALLERGIES:  has No Known Allergies.  MEDICATIONS:  Current Outpatient Medications  Medication Sig Dispense Refill   acetaminophen (TYLENOL) 650 MG CR tablet Take 1,300 mg by mouth every 8 (eight) hours as needed for pain.     amLODipine (NORVASC) 5 MG tablet Take 5 mg by mouth daily.     atenolol (TENORMIN) 50 MG tablet Take 50 mg by mouth daily.     Cholecalciferol (VITAMIN D3) 125 MCG (5000 UT) CAPS Take 5,000 Units by mouth daily.     hydrochlorothiazide (HYDRODIURIL) 25 MG tablet Take 25 mg by mouth daily.     levothyroxine (SYNTHROID) 100 MCG tablet Take 100 mcg by mouth daily before breakfast.     Multiple Vitamins-Minerals (MULTIVITAMIN WITH MINERALS) tablet Take 1 tablet by mouth daily.     omeprazole (PRILOSEC) 20 MG capsule   Take 20 mg by mouth daily.     Turmeric 500 MG CAPS Take 500 mg by mouth daily.     vitamin C (ASCORBIC ACID) 500 MG tablet Take 500 mg by mouth daily.     No current facility-administered medications for this visit.    Review of Systems  Constitutional:  Negative for appetite change, chills, fatigue, fever and unexpected weight change.  HENT:   Negative for hearing loss and voice change.   Eyes:  Negative for eye problems and icterus.  Respiratory:  Negative for chest tightness, cough and shortness of breath.   Cardiovascular:  Negative for chest pain and leg swelling.  Gastrointestinal:  Negative for abdominal distention and abdominal pain.       Dysphagia/epigastric fullness  Endocrine: Negative for hot flashes.  Genitourinary:  Negative for difficulty urinating, dysuria and frequency.   Musculoskeletal:  Negative for arthralgias.  Skin:  Negative for itching and rash.  Neurological:  Negative for  light-headedness and numbness.  Hematological:  Negative for adenopathy. Does not bruise/bleed easily.  Psychiatric/Behavioral:  Negative for confusion.      PHYSICAL EXAMINATION: ECOG PERFORMANCE STATUS: 0 - Asymptomatic  Vitals:   08/28/22 0943  BP: (!) 152/92  Pulse: 70  Resp: 18  Temp: 98.1 F (36.7 C)   Filed Weights   08/28/22 0943  Weight: 265 lb 3.2 oz (120.3 kg)    Physical Exam Constitutional:      General: He is not in acute distress.    Appearance: He is obese. He is not diaphoretic.  HENT:     Head: Normocephalic and atraumatic.     Nose: Nose normal.     Mouth/Throat:     Pharynx: No oropharyngeal exudate.  Eyes:     General: No scleral icterus.    Pupils: Pupils are equal, round, and reactive to light.  Cardiovascular:     Rate and Rhythm: Normal rate and regular rhythm.     Heart sounds: No murmur heard. Pulmonary:     Effort: Pulmonary effort is normal. No respiratory distress.     Breath sounds: No rales.  Chest:     Chest wall: No tenderness.  Abdominal:     General: There is no distension.     Palpations: Abdomen is soft.     Tenderness: There is no abdominal tenderness.  Musculoskeletal:        General: Normal range of motion.     Cervical back: Normal range of motion and neck supple.  Skin:    General: Skin is warm and dry.     Findings: No erythema.  Neurological:     Mental Status: He is alert and oriented to person, place, and time.     Cranial Nerves: No cranial nerve deficit.     Motor: No abnormal muscle tone.     Coordination: Coordination normal.  Psychiatric:        Mood and Affect: Affect normal.      LABORATORY DATA:  I have reviewed the data as listed    Latest Ref Rng & Units 08/28/2022   10:45 AM 09/01/2020    8:49 AM  CBC  WBC 4.0 - 10.5 [name]/uL 9.4  6.0   Hemoglobin 13.0 - 17.0 g/dL 14.0  14.5   Hematocrit 39.0 - 52.0 % 42.6  44.2   Platelets 150 - 400 [name]/uL 369  218       Latest Ref Rng & Units 08/28/2022    10:45 AM 09/01/2020      8:49 AM  CMP  Glucose 70 - 99 mg/dL 100  120   BUN 8 - 23 mg/dL 10  17   Creatinine 0.61 - 1.24 mg/dL 1.12  1.01   Sodium 135 - 145 mmol/L 138  140   Potassium 3.5 - 5.1 mmol/L 2.9  2.5   Chloride 98 - 111 mmol/L 99  100   CO2 22 - 32 mmol/L 27  26   Calcium 8.9 - 10.3 mg/dL 9.3  9.4   Total Protein 6.5 - 8.1 g/dL 7.9  7.3   Total Bilirubin 0.3 - 1.2 mg/dL 0.5  0.7   Alkaline Phos 38 - 126 U/L 43  32   AST 15 - 41 U/L 34  73   ALT 0 - 44 U/L 46  131      RADIOGRAPHIC STUDIES: I have personally reviewed the radiological images as listed and agreed with the findings in the report. CT CHEST ABDOMEN PELVIS W CONTRAST  Result Date: 08/27/2022 CLINICAL DATA:  Dysphagia. Lower esophageal tumor on endoscopy. * Tracking Code: BO * EXAM: CT CHEST, ABDOMEN, AND PELVIS WITH CONTRAST TECHNIQUE: Multidetector CT imaging of the chest, abdomen and pelvis was performed following the standard protocol during bolus administration of intravenous contrast. RADIATION DOSE REDUCTION: This exam was performed according to the departmental dose-optimization program which includes automated exposure control, adjustment of the mA and/or kV according to patient size and/or use of iterative reconstruction technique. CONTRAST:  100mL OMNIPAQUE IOHEXOL 300 MG/ML  SOLN COMPARISON:  Esophagram 08/15/2022. Clinic note of 08/21/2022. endoscopy report of 08/23/2022. 03/07/2021 abdominal ultrasound. Abdominopelvic CT 03/08/2014. FINDINGS: CT CHEST FINDINGS Cardiovascular: Aortic atherosclerosis. Tortuous thoracic aorta. Normal heart size, without pericardial effusion. Lad coronary artery calcification. Mediastinum/Nodes: No mediastinal or hilar adenopathy. Tiny hiatal hernia. The esophageal primary is identified as an area of wall thickening and mucosal hyperenhancement, beginning just below the carina and terminating just above the GE junction. Example 36/3. No significant obstruction. Lungs/Pleura: No  pleural fluid. Mild anterior right lower lobe volume with indeterminate soft tissue density on 111/4. Subpleural 2 mm right middle lobe pulmonary nodule on 87/4. A 5 mm right lower lobe pulmonary nodule on 108/4 is new since 2015 abdominal CT. Musculoskeletal: No acute osseous abnormality. CT ABDOMEN PELVIS FINDINGS Hepatobiliary: Mild hepatic steatosis, without suspicious liver lesion. 4 mm gallstone without acute cholecystitis or biliary duct dilatation. Pancreas: Normal, without mass or ductal dilatation. Spleen: Normal in size, without focal abnormality. Adrenals/Urinary Tract: Normal adrenal glands. 4 mm interpolar left renal collecting system calculus. Interpolar left renal 1.6 cm cyst. Normal right kidney. Involution of previous dominant interpolar left renal cyst or minimally complex cyst posteriorly including at 2.9 cm on 25/10 . In the absence of clinically indicated signs/symptoms require(s) no independent follow-up. No hydronephrosis. Normal urinary bladder. Stomach/Bowel: Normal distal stomach. Scattered colonic diverticula. Normal terminal ileum. Favor dense contrast in the appendix on 86/3. Metallic foreign body felt less likely, especially given recent esophagram. Normal small bowel. Vascular/Lymphatic: Separate origins of the splenic and common hepatic arteries. Aortic atherosclerosis. Gastrohepatic ligament adenopathy, including at 1.5 x 1.5 cm on 48/3. More caudal gastrohepatic ligament node of 9 mm on 55/3. No pelvic sidewall adenopathy. Reproductive: Normal prostate. Other: No significant free fluid. Tiny fat containing right inguinal hernia. No evidence of omental or peritoneal disease. Musculoskeletal: Posterior right acetabular sclerotic lesion of 6 mm is similar to the prior and presumably a bone island. Lumbosacral spondylosis. IMPRESSION: 1. Distal esophageal primary with gastrohepatic ligament nodal metastasis. 2. 2   right-sided pulmonary nodules, the largest of which measures 5 mm and is  new since 2015. Pulmonary metastasis not be excluded. Consider CT follow-up at 3 months. 3. Anterior right lower lobe volume loss and minimal soft tissue density, favoring atelectasis or scar. Recommend attention on follow-up. 4. Hepatic steatosis 5. Cholelithiasis 6. Left nephrolithiasis 7. Coronary artery atherosclerosis. Aortic Atherosclerosis (ICD10-I70.0). Electronically Signed   By: Kyle  Talbot M.D.   On: 08/27/2022 10:15   DG ESOPHAGUS W DOUBLE CM (HD)  Result Date: 08/15/2022 INDICATION: Patient complains of intermittent dysphagia with midsternal chest discomfort with eating solid foods only. No prior pertinent surgeries. EXAM: ESOPHAGUS/BARIUM SWALLOW/TABLET STUDY TECHNIQUE: Combined double and single contrast examination was performed using effervescent crystals, high-density barium, and thin liquid barium. The patient was observed with fluoroscopy swallowing a 13 mm barium sulphate tablet. This exam was performed by Koreen Morgan, and was supervised and interpreted by Peter Noone. FLUOROSCOPY: Radiation Exposure Index (as provided by the fluoroscopic device): 86.70 mGy COMPARISON:  None Available. FINDINGS: Swallowing: Appears normal. No vestibular penetration or aspiration seen. Pharynx: Unremarkable. Esophagus: There is marked mucosal irregularity in the distal esophagus with areas of ulceration. There is a broad-based mural based filling defect along the posterior esophageal wall measuring proximally 2.3 cm in length (best appreciated on the last full exposure image and the last cine sequence). Esophageal motility: Esophageal motility in the proximal 2/3 appears within normal limits. There is abnormal motility in the distal third. Hiatal Hernia: Small hiatal hernia. Gastroesophageal reflux: Moderate spontaneous gastroesophageal reflux was seen extending to the midesophagus. Ingested 13mm barium tablet: Passed without delay. Other: None. IMPRESSION: 1. Marked mucosal irregularity in the distal  esophagus with broad-based mural filling defect highly suspicious for malignancy. Recommend correlation with endoscopy. 2. Small hiatal hernia. 3. Moderate amount of spontaneous gastroesophageal reflux was seen extending to the mid esophagus. This exam was performed by Koreen Morgan PA-C, and was supervised and interpreted by Dr. Noone. These results will be called to the ordering clinician or representative by the Radiologist Assistant, and communication documented in the PACS or [facility] Dashboard. Electronically Signed   By: Peter  Noone M.D.   On: 08/15/2022 11:21   

## 2022-08-28 NOTE — Assessment & Plan Note (Signed)
Discussed with patient

## 2022-08-29 ENCOUNTER — Telehealth: Payer: Self-pay

## 2022-08-29 LAB — CEA: CEA: 4.1 ng/mL (ref 0.0–4.7)

## 2022-08-29 NOTE — Telephone Encounter (Signed)
-----  Message from Earlie Server, MD sent at 08/28/2022  4:59 PM EST ----- Potassium is low.  I recommend patient to start potassium supplementation.  I have sent a prescription to his pharmacy.  Please let patient know.  Thanks

## 2022-08-29 NOTE — Telephone Encounter (Signed)
Pt informed and verbalized understanding

## 2022-09-03 ENCOUNTER — Encounter: Payer: Self-pay | Admitting: Gastroenterology

## 2022-09-03 LAB — SURGICAL PATHOLOGY

## 2022-09-05 ENCOUNTER — Ambulatory Visit
Admission: RE | Admit: 2022-09-05 | Discharge: 2022-09-05 | Disposition: A | Discharge status: Home / Self Care | Payer: No Typology Code available for payment source | Source: Ambulatory Visit | Attending: Oncology | Admitting: Oncology

## 2022-09-05 DIAGNOSIS — I2510 Atherosclerotic heart disease of native coronary artery without angina pectoris: Secondary | ICD-10-CM | POA: Diagnosis not present

## 2022-09-05 DIAGNOSIS — N200 Calculus of kidney: Secondary | ICD-10-CM | POA: Diagnosis not present

## 2022-09-05 DIAGNOSIS — C159 Malignant neoplasm of esophagus, unspecified: Secondary | ICD-10-CM | POA: Insufficient documentation

## 2022-09-05 DIAGNOSIS — C155 Malignant neoplasm of lower third of esophagus: Secondary | ICD-10-CM | POA: Insufficient documentation

## 2022-09-05 DIAGNOSIS — K8020 Calculus of gallbladder without cholecystitis without obstruction: Secondary | ICD-10-CM | POA: Diagnosis not present

## 2022-09-05 DIAGNOSIS — I700 Atherosclerosis of aorta: Secondary | ICD-10-CM | POA: Insufficient documentation

## 2022-09-05 LAB — GLUCOSE, CAPILLARY: Glucose-Capillary: 99 mg/dL (ref 70–99)

## 2022-09-05 MED ORDER — FLUDEOXYGLUCOSE F - 18 (FDG) INJECTION
13.5000 | Freq: Once | INTRAVENOUS | Status: AC | PRN
  Administered 2022-09-05: 14.76 via INTRAVENOUS

## 2022-09-10 DIAGNOSIS — M9901 Segmental and somatic dysfunction of cervical region: Secondary | ICD-10-CM | POA: Diagnosis not present

## 2022-09-10 DIAGNOSIS — M9903 Segmental and somatic dysfunction of lumbar region: Secondary | ICD-10-CM | POA: Diagnosis not present

## 2022-09-10 DIAGNOSIS — M9902 Segmental and somatic dysfunction of thoracic region: Secondary | ICD-10-CM | POA: Diagnosis not present

## 2022-09-10 DIAGNOSIS — M62830 Muscle spasm of back: Secondary | ICD-10-CM | POA: Diagnosis not present

## 2022-09-11 ENCOUNTER — Encounter: Payer: Self-pay | Admitting: Internal Medicine

## 2022-09-12 ENCOUNTER — Telehealth: Payer: Self-pay

## 2022-09-12 ENCOUNTER — Ambulatory Visit: Payer: No Typology Code available for payment source | Admitting: Registered Nurse

## 2022-09-12 ENCOUNTER — Encounter: Payer: Self-pay | Admitting: Internal Medicine

## 2022-09-12 ENCOUNTER — Encounter
Admission: RE | Disposition: A | Discharge status: Home / Self Care | Payer: Self-pay | Source: Home / Self Care | Attending: Internal Medicine

## 2022-09-12 ENCOUNTER — Ambulatory Visit
Admission: RE | Admit: 2022-09-12 | Discharge: 2022-09-12 | Disposition: A | Discharge status: Home / Self Care | Payer: No Typology Code available for payment source | Attending: Internal Medicine | Admitting: Internal Medicine

## 2022-09-12 DIAGNOSIS — I2510 Atherosclerotic heart disease of native coronary artery without angina pectoris: Secondary | ICD-10-CM | POA: Insufficient documentation

## 2022-09-12 DIAGNOSIS — Z79899 Other long term (current) drug therapy: Secondary | ICD-10-CM | POA: Insufficient documentation

## 2022-09-12 DIAGNOSIS — C154 Malignant neoplasm of middle third of esophagus: Secondary | ICD-10-CM | POA: Diagnosis not present

## 2022-09-12 DIAGNOSIS — K760 Fatty (change of) liver, not elsewhere classified: Secondary | ICD-10-CM | POA: Insufficient documentation

## 2022-09-12 DIAGNOSIS — E876 Hypokalemia: Secondary | ICD-10-CM | POA: Diagnosis not present

## 2022-09-12 DIAGNOSIS — N200 Calculus of kidney: Secondary | ICD-10-CM | POA: Diagnosis not present

## 2022-09-12 DIAGNOSIS — Z803 Family history of malignant neoplasm of breast: Secondary | ICD-10-CM | POA: Insufficient documentation

## 2022-09-12 DIAGNOSIS — I10 Essential (primary) hypertension: Secondary | ICD-10-CM | POA: Insufficient documentation

## 2022-09-12 DIAGNOSIS — K2289 Other specified disease of esophagus: Secondary | ICD-10-CM | POA: Diagnosis not present

## 2022-09-12 DIAGNOSIS — K8020 Calculus of gallbladder without cholecystitis without obstruction: Secondary | ICD-10-CM | POA: Insufficient documentation

## 2022-09-12 DIAGNOSIS — C155 Malignant neoplasm of lower third of esophagus: Secondary | ICD-10-CM | POA: Diagnosis not present

## 2022-09-12 DIAGNOSIS — Z8042 Family history of malignant neoplasm of prostate: Secondary | ICD-10-CM | POA: Diagnosis not present

## 2022-09-12 DIAGNOSIS — C159 Malignant neoplasm of esophagus, unspecified: Secondary | ICD-10-CM

## 2022-09-12 DIAGNOSIS — I899 Noninfective disorder of lymphatic vessels and lymph nodes, unspecified: Secondary | ICD-10-CM | POA: Insufficient documentation

## 2022-09-12 DIAGNOSIS — E039 Hypothyroidism, unspecified: Secondary | ICD-10-CM | POA: Diagnosis not present

## 2022-09-12 HISTORY — PX: EUS: SHX5427

## 2022-09-12 HISTORY — DX: Other specified disease of esophagus: K22.89

## 2022-09-12 SURGERY — ULTRASOUND, UPPER GI TRACT, ENDOSCOPIC
Anesthesia: General

## 2022-09-12 MED ORDER — GLYCOPYRROLATE 0.2 MG/ML IJ SOLN
INTRAMUSCULAR | Status: DC | PRN
  Administered 2022-09-12: .2 mg via INTRAVENOUS

## 2022-09-12 MED ORDER — SODIUM CHLORIDE 0.9 % IV SOLN: INTRAVENOUS | Status: DC

## 2022-09-12 MED ORDER — PROPOFOL 500 MG/50ML IV EMUL
INTRAVENOUS | Status: DC | PRN
  Administered 2022-09-12: 150 ug/kg/min via INTRAVENOUS

## 2022-09-12 MED ORDER — PROPOFOL 10 MG/ML IV BOLUS
INTRAVENOUS | Status: DC | PRN
  Administered 2022-09-12: 100 mg via INTRAVENOUS

## 2022-09-12 MED ORDER — DEXMEDETOMIDINE HCL IN NACL 200 MCG/50ML IV SOLN
INTRAVENOUS | Status: DC | PRN
  Administered 2022-09-12: 8 ug via INTRAVENOUS

## 2022-09-12 MED ORDER — LIDOCAINE HCL (CARDIAC) PF 100 MG/5ML IV SOSY
PREFILLED_SYRINGE | INTRAVENOUS | Status: DC | PRN
  Administered 2022-09-12: 40 mg via INTRAVENOUS

## 2022-09-12 NOTE — Discharge Instructions (Signed)
Discharge to home °

## 2022-09-12 NOTE — Anesthesia Preprocedure Evaluation (Signed)
Anesthesia Evaluation  Patient identified by MRN, date of birth, ID band Patient awake    Reviewed: Allergy & Precautions, NPO status , Patient's Chart, lab work & pertinent test results  History of Anesthesia Complications (+) PONV and history of anesthetic complications  Airway Mallampati: III  TM Distance: >3 FB Neck ROM: full    Dental no notable dental hx.    Pulmonary neg pulmonary ROS   Pulmonary exam normal        Cardiovascular hypertension, On Medications and On Home Beta Blockers negative cardio ROS Normal cardiovascular exam     Neuro/Psych negative neurological ROS  negative psych ROS   GI/Hepatic negative GI ROS, Neg liver ROS,,,dysphagia   Endo/Other  Hypothyroidism    Renal/GU negative Renal ROS  negative genitourinary   Musculoskeletal   Abdominal   Peds  Hematology negative hematology ROS (+)   Anesthesia Other Findings Past Medical History: No date: Arthritis No date: Complication of anesthesia     Comment:  OCCURRED ONCE YEARS AGO 1981 No date: Hypertension No date: Hypothyroidism No date: PONV (postoperative nausea and vomiting)  Past Surgical History: No date: COLONOSCOPY No date: FINGER SURGERY No date: TENDON REPAIR IN LEFT KNEE     Reproductive/Obstetrics negative OB ROS                              Anesthesia Physical Anesthesia Plan  ASA: 3  Anesthesia Plan: General   Post-op Pain Management: Minimal or no pain anticipated   Induction: Intravenous  PONV Risk Score and Plan: Propofol infusion and TIVA  Airway Management Planned: Natural Airway and Nasal Cannula  Additional Equipment:   Intra-op Plan:   Post-operative Plan:   Informed Consent: I have reviewed the patients History and Physical, chart, labs and discussed the procedure including the risks, benefits and alternatives for the proposed anesthesia with the patient or  authorized representative who has indicated his/her understanding and acceptance.     Dental Advisory Given  Plan Discussed with: Anesthesiologist, CRNA and Surgeon  Anesthesia Plan Comments: (Patient consented for risks of anesthesia including but not limited to:  - adverse reactions to medications - risk of airway placement if required - damage to eyes, teeth, lips or other oral mucosa - nerve damage due to positioning  - sore throat or hoarseness - Damage to heart, brain, nerves, lungs, other parts of body or loss of life  Patient voiced understanding.)        Anesthesia Quick Evaluation

## 2022-09-12 NOTE — Transfer of Care (Signed)
Immediate Anesthesia Transfer of Care Note  Patient: Jonathan [name]  Procedure(s) Performed: Procedure(s): FULL UPPER ENDOSCOPIC ULTRASOUND (EUS) RADIAL (N/A)  Patient Location: PACU and Endoscopy Unit  Anesthesia Type:General  Level of Consciousness: sedated  Airway & Oxygen Therapy: Patient Spontanous Breathing and Patient connected to nasal cannula oxygen  Post-op Assessment: Report given to RN and Post -op Vital signs reviewed and stable  Post vital signs: Reviewed and stable  Last Vitals:  Vitals:   09/12/22 1251 09/12/22 1404  BP: 139/78 (!) 94/56  Pulse: 70 71  Resp: 16 15  Temp: 36.6 C (!) 36 C  SpO2: [id number]     Complications: No apparent anesthesia complications

## 2022-09-12 NOTE — Telephone Encounter (Signed)
Looks like he is having a procedure today (same day). He should be good to scheduled tomorrow. Will you add to reminder list for tomorrow please

## 2022-09-12 NOTE — Telephone Encounter (Signed)
-----   Message from Earlie Server, MD sent at 09/11/2022 10:50 PM EST ----- Please let patient know that I recommend MRI abdomen w wo contrast ASAP to further evaluate an area in his liver.  - use diagnosis Adenocarcinoma of esophagus

## 2022-09-12 NOTE — Op Note (Signed)
[facility] Gastroenterology Patient Name: Jonathan [name] Procedure Date: 09/12/2022 1:35 PM MRN: [id number] Account #: [id number] Date of Birth: 1956/03/30 Admit Type: Outpatient Age: 67 Room: [facility] ENDO ROOM 3 Gender: Male Note Status: Finalized Instrument Name: Altamese Cabal Endoscope [id number] [id number] Procedure:             Upper EUS Indications:           Pre-treatment staging of esophageal adenocarcinoma Patient Profile:       Refer to note in patient chart for documentation of                         history and physical. Providers:             Murray Hodgkins. [place] Referring MD:          Irven Easterly. Kary Kos, MD (Referring MD), Earlie Server, MD                         (Referring MD) Medicines:             Propofol per Anesthesia Complications:         No immediate complications. Procedure:             Pre-Anesthesia Assessment:                        - Prior to the procedure, a History and Physical was                         performed, and patient medications and allergies were                         reviewed. The patient is competent. The risks and                         benefits of the procedure and the sedation options and                         risks were discussed with the patient. All questions                         were answered and informed consent was obtained.                         Patient identification and proposed procedure were                         verified by the physician, the nurse and the                         anesthetist in the pre-procedure area. Mental Status                         Examination: alert and oriented. Airway Examination:                         normal oropharyngeal airway and neck mobility.                         Respiratory Examination: clear to auscultation. CV  Examination: normal. Prophylactic Antibiotics: The                         patient does not require prophylactic antibiotics.                          Prior Anticoagulants: The patient has taken no                         anticoagulant or antiplatelet agents. ASA Grade                         Assessment: III - A patient with severe systemic                         disease. After reviewing the risks and benefits, the                         patient was deemed in satisfactory condition to                         undergo the procedure. The anesthesia plan was to use                         monitored anesthesia care (MAC). Immediately prior to                         administration of medications, the patient was                         re-assessed for adequacy to receive sedatives. The                         heart rate, respiratory rate, oxygen saturations,                         blood pressure, adequacy of pulmonary ventilation, and                         response to care were monitored throughout the                         procedure. The physical status of the patient was                         re-assessed after the procedure.                        After obtaining informed consent, the endoscope was                         passed under direct vision. Throughout the procedure,                         the patient's blood pressure, pulse, and oxygen                         saturations were monitored continuously. The Endoscope  was introduced through the mouth, and advanced to the                         second part of duodenum. The Endoscope was introduced                         through the mouth, and advanced to the stomach for                         ultrasound examination from the esophagus and stomach.                         The upper EUS was accomplished without difficulty. The                         patient tolerated the procedure well. Findings:      ENDOSCOPIC FINDING: :      A large, fungating mass was found in the middle/lower third of the       esophagus, 29 to 37 cm from the  incisors. The mass was partially       obstructing and circumferential.      The entire examined stomach was endoscopically normal.      The examined duodenum was endoscopically normal.      ENDOSONOGRAPHIC FINDING: :      A hypoechoic mass was found in the middle/lower third of the esophagus.       The mass was encountered at 29 cm from the incisors and extended to 37       cm. The lesion was circumferential. The endosonographic borders were       well-defined. There was sonographic evidence suggesting invasion into       the adventitia (Layer 5).      One malignant-appearing lymph node was visualized in the gastrohepatic       ligament (level 18). It measured 17 mm by 11 mm in maximal       cross-sectional diameter. The node was oval, hypoechoic and had well       defined margins.      Endosonographic imaging in the left lobe of the liver showed no       abnormalities.      The celiac region was visualized and showed no sign of significant       endosonographic abnormality. Impression:            EGD Impressions:                        - Partially obstructing, malignant esophageal tumor                         was found in the middle/lower third of the esophagus.                         Consistent with known adenocarcinoma.                        - Normal stomach.                        - Normal examined duodenum.  EUS Impressions:                        - A mass was found in the middle/lower third of the                         esophagus. A tissue diagnosis was obtained prior to                         this exam. This is consistent with adenocarcinoma.                         This was staged T3 N1 Mx by endosonographic criteria.                         Please correlate nodal staging with cross-sectional                         imaging.                        - One malignant-appearing lymph node was visualized in                         the gastrohepatic  ligament (level 18). No other                         lymphadenopathy was visualized.                        - Normal visualized portions of the liver.                        - Normal celiac region.                        - No specimens collected. Recommendation:        - Discharge patient to home (ambulatory).                        - The findings and recommendations were discussed with                         the patient.                        - Return to referring medical oncologist, Dr. [name], as                         previously scheduled. Further plan of care to be                         determined by Dr. [name]. Procedure Code(s):     --- Professional ---                        [phone], Esophagogastroduodenoscopy, flexible,                         transoral; with endoscopic ultrasound examination  limited to the esophagus, stomach or duodenum, and                         adjacent structures Diagnosis Code(s):     --- Professional ---                        C15.4, Malignant neoplasm of middle third of esophagus                        C15.5, Malignant neoplasm of lower third of esophagus                        K22.89                        I89.9, Noninfective disorder of lymphatic vessels and                         lymph nodes, unspecified CPT copyright 2022 American Medical Association. All rights reserved. The codes documented in this report are preliminary and upon coder review may  be revised to meet current compliance requirements. Attending Participation:      I personally performed the entire procedure without the assistance of a       fellow, resident or surgical assistant. [place],  09/12/2022 2:07:46 PM This report has been signed electronically. Number of Addenda: 0 Note Initiated On: 09/12/2022 1:35 PM Estimated Blood Loss:  Estimated blood loss: none.      [facility]

## 2022-09-12 NOTE — Interval H&P Note (Signed)
History and Physical Interval Note:  09/12/2022 1:37 PM  Jonathan [name]  has presented today for surgery, with the diagnosis of Esophageal cancer.  The various methods of treatment have been discussed with the patient and family. After consideration of risks, benefits and other options for treatment, the patient has consented to  Procedure(s): FULL UPPER ENDOSCOPIC ULTRASOUND (EUS) RADIAL (N/A) as a surgical intervention.  The patient's history has been reviewed, patient examined, no change in status, stable for surgery.  I have reviewed the patient's chart and labs.  Questions were answered to the patient's satisfaction.     Tillie Rung

## 2022-09-12 NOTE — Telephone Encounter (Signed)
Pt informed of MD recommendation for MRI and verbalized understanding.   Please schedule MRI abdomen ASAP and inform pt of appt

## 2022-09-13 ENCOUNTER — Telehealth: Payer: Self-pay

## 2022-09-13 ENCOUNTER — Encounter: Payer: Self-pay | Admitting: Internal Medicine

## 2022-09-13 ENCOUNTER — Other Ambulatory Visit: Payer: Self-pay | Admitting: Oncology

## 2022-09-13 DIAGNOSIS — C159 Malignant neoplasm of esophagus, unspecified: Secondary | ICD-10-CM

## 2022-09-13 MED ORDER — PROCHLORPERAZINE MALEATE 10 MG PO TABS: 10.0000 mg | ORAL_TABLET | Freq: Four times a day (QID) | ORAL | 1 refills | Status: DC | PRN

## 2022-09-13 MED ORDER — ONDANSETRON HCL 8 MG PO TABS: 8.0000 mg | ORAL_TABLET | Freq: Three times a day (TID) | ORAL | 1 refills | Status: DC | PRN

## 2022-09-13 NOTE — Telephone Encounter (Signed)
Called and spoke with Jonathan [name]. Reviewed results of PET scan and EUS. MRI pending for liver evaluation. In the interim referral sent to radiation and chemo class will be arranged.

## 2022-09-13 NOTE — Progress Notes (Signed)
START ON PATHWAY REGIMEN - Gastroesophageal     Administer weekly during RT:     Paclitaxel      Carboplatin   **Always confirm dose/schedule in your pharmacy ordering system**  Patient Characteristics: Esophageal & GE Junction, Adenocarcinoma, Preoperative or Nonsurgical Candidate, M0 (Clinical Staging), cT2 or Higher or cN+, Surgical Candidate (Up to cT4a) - Preoperative Therapy, Esophageal Therapeutic Status: Preoperative or Nonsurgical Candidate, M0 (Clinical Staging) Histology: Adenocarcinoma Disease Classification: Esophageal AJCC Grade: GX AJCC 8 Stage Grouping: III AJCC T Category: cT3 AJCC N Category: cN1 AJCC M Category: cM0 Intent of Therapy: Curative Intent, Discussed with Patient

## 2022-09-13 NOTE — Telephone Encounter (Signed)
Please arrange him for chemotherapy class carbo and taxol.  Jonathan [name] says she has referred him to establish care with Radonc, so they will contact him with appt.  Future chemo plan depends on when he starts radiation.

## 2022-09-13 NOTE — Telephone Encounter (Signed)
Call made to pt and informed him again of MD recommendation of MRI. He stated that  Jonathan [name] has contacted him and answered questions as well. Informed him that once Dr. [name] has a treatment plan for him, our office will contact him with appt details. Pt verbalized understanding.

## 2022-09-15 ENCOUNTER — Other Ambulatory Visit: Payer: Self-pay

## 2022-09-17 ENCOUNTER — Other Ambulatory Visit: Payer: Self-pay

## 2022-09-19 ENCOUNTER — Ambulatory Visit
Admission: RE | Admit: 2022-09-19 | Discharge: 2022-09-19 | Disposition: A | Discharge status: Home / Self Care | Payer: No Typology Code available for payment source | Source: Ambulatory Visit | Attending: Radiation Oncology | Admitting: Radiation Oncology

## 2022-09-19 ENCOUNTER — Encounter: Payer: Self-pay | Admitting: Radiation Oncology

## 2022-09-19 VITALS — BP 138/81 | HR 72 | Temp 98.1°F | Resp 18 | Ht 69.0 in | Wt 256.4 lb

## 2022-09-19 DIAGNOSIS — Z79890 Hormone replacement therapy (postmenopausal): Secondary | ICD-10-CM | POA: Insufficient documentation

## 2022-09-19 DIAGNOSIS — Z79899 Other long term (current) drug therapy: Secondary | ICD-10-CM | POA: Insufficient documentation

## 2022-09-19 DIAGNOSIS — Z8042 Family history of malignant neoplasm of prostate: Secondary | ICD-10-CM | POA: Insufficient documentation

## 2022-09-19 DIAGNOSIS — C159 Malignant neoplasm of esophagus, unspecified: Secondary | ICD-10-CM | POA: Insufficient documentation

## 2022-09-19 DIAGNOSIS — I10 Essential (primary) hypertension: Secondary | ICD-10-CM | POA: Diagnosis not present

## 2022-09-19 DIAGNOSIS — E039 Hypothyroidism, unspecified: Secondary | ICD-10-CM | POA: Insufficient documentation

## 2022-09-19 NOTE — Anesthesia Postprocedure Evaluation (Signed)
Anesthesia Post Note  Patient: ALVONTE PIKER  Procedure(s) Performed: FULL UPPER ENDOSCOPIC ULTRASOUND (EUS) RADIAL  Patient location during evaluation: Endoscopy Anesthesia Type: General Level of consciousness: awake and alert Pain management: pain level controlled Vital Signs Assessment: post-procedure vital signs reviewed and stable Respiratory status: spontaneous breathing, nonlabored ventilation, respiratory function stable and patient connected to nasal cannula oxygen Cardiovascular status: blood pressure returned to baseline and stable Postop Assessment: no apparent nausea or vomiting Anesthetic complications: no   No notable events documented.   Last Vitals:  Vitals:   09/12/22 1424 09/12/22 1432  BP: 102/78 113/78  Pulse: 70 74  Resp: 16   Temp:    SpO2: 98%     Last Pain:  Vitals:   09/13/22 0739  TempSrc:   PainSc: 0-No pain                 Martha Clan

## 2022-09-19 NOTE — Consult Note (Signed)
NEW PATIENT EVALUATION  Name: Jonathan [name]  MRN: [id number]  Date:   09/19/2022     DOB: 1956/01/19   This 67 y.o. 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  REFERRING PHYSICIAN: Maryland Pink, MD  CHIEF COMPLAINT:  Chief Complaint  Patient presents with   Esophageal Cancer    DIAGNOSIS: The encounter diagnosis was Adenocarcinoma of esophagus ([place]).   PREVIOUS INVESTIGATIONS:  CT scans PET scan reviewed MRI pending Clinical notes reviewed Pathology report reviewed  HPI: Patient is a 67 year old male who presented with food sticking in his esophagus.  He was evaluated by GI and upper endoscopy showed a large fungating mass found in the middle to lower third of the esophagus 29 to 37 cm from the incisors partially obstructing and circumferential.  Biopsy was positive for adenocarcinoma.  Stomach was endoscopically normal.  Endoscopically and sonographically there was evidence of suggestion into the adventitia.  PET CT scan was performed showing hypermetabolic activity in the mid to lower esophagus.  There was also hypermetabolic activity in the gastrohepatic ligament nodal metastasis.  There is also a dome of the liver hypermetabolic lesion concerning for metastatic disease and MRI scan is planned for tomorrow.  He is seen today is doing fairly well although he still states food is hanging up in his food pipe.  He has been seen by medical oncology with recommendation for concurrent chemoradiation.  PLANNED TREATMENT REGIMEN: Concurrent chemoradiation  PAST MEDICAL HISTORY:  has a past medical history of Arthritis, Complication of anesthesia, Esophageal mass, Hypertension, Hypothyroidism, and PONV (postoperative nausea and vomiting).    PAST SURGICAL HISTORY:  Past Surgical History:  Procedure Laterality Date   COLONOSCOPY     ESOPHAGOGASTRODUODENOSCOPY N/A 08/23/2022    Procedure: ESOPHAGOGASTRODUODENOSCOPY (EGD);  Surgeon: Annamaria Helling, DO;  Location[facility] ENDOSCOPY;  Service: Gastroenterology;  Laterality: N/A;   EUS N/A 09/12/2022   Procedure: FULL UPPER ENDOSCOPIC ULTRASOUND (EUS) RADIAL;  Surgeon: Holly Bodily, MD;  Location: [facility] ENDOSCOPY;  Service: Gastroenterology;  Laterality: N/A;   FINGER SURGERY     TENDON REPAIR IN LEFT KNEE      FAMILY HISTORY: family history includes Breast cancer in his sister; Heart attack in his mother; Prostate cancer in his father and paternal uncle.  SOCIAL HISTORY:  reports that he has never smoked. He has never used smokeless tobacco. He reports current alcohol use. He reports that he does not use drugs.  ALLERGIES: Patient has no known allergies.  MEDICATIONS:  Current Outpatient Medications  Medication Sig Dispense Refill   acetaminophen (TYLENOL) 650 MG CR tablet Take 1,300 mg by mouth every 8 (eight) hours as needed for pain.     amLODipine (NORVASC) 5 MG tablet Take 5 mg by mouth daily.     atenolol (TENORMIN) 50 MG tablet Take 50 mg by mouth daily.     Cholecalciferol (VITAMIN D3) 125 MCG (5000 UT) CAPS Take 5,000 Units by mouth daily.     hydrochlorothiazide (HYDRODIURIL) 25 MG tablet Take 25 mg by mouth daily.     levothyroxine (SYNTHROID) 100 MCG tablet Take 100 mcg by mouth daily before breakfast.     Multiple Vitamins-Minerals (MULTIVITAMIN WITH MINERALS) tablet Take 1 tablet by mouth daily.     omeprazole (PRILOSEC) 20 MG capsule Take 20 mg by mouth daily.     ondansetron (ZOFRAN) 8 MG tablet Take 1 tablet (8 mg total) by mouth every  8 (eight) hours as needed for nausea or vomiting. Start on the third day after chemotherapy. 30 tablet 1   potassium chloride SA (KLOR-CON M) 20 MEQ tablet Take 1 tablet (20 mEq total) by mouth daily. 14 tablet 0   prochlorperazine (COMPAZINE) 10 MG tablet Take 1 tablet (10 mg total) by mouth every 6 (six) hours as needed for nausea or vomiting. 30 tablet  1   Turmeric 500 MG CAPS Take 500 mg by mouth daily.     vitamin C (ASCORBIC ACID) 500 MG tablet Take 500 mg by mouth daily.     No current facility-administered medications for this encounter.    ECOG PERFORMANCE STATUS:  1 - Symptomatic but completely ambulatory  REVIEW OF SYSTEMS: Patient denies any weight loss, fatigue, weakness, fever, chills or night sweats. Patient denies any loss of vision, blurred vision. Patient denies any ringing  of the ears or hearing loss. No irregular heartbeat. Patient denies heart murmur or history of fainting. Patient denies any chest pain or pain radiating to her upper extremities. Patient denies any shortness of breath, difficulty breathing at night, cough or hemoptysis. Patient denies any swelling in the lower legs. Patient denies any nausea vomiting, vomiting of blood, or coffee ground material in the vomitus. Patient denies any stomach pain. Patient states has had normal bowel movements no significant constipation or diarrhea. Patient denies any dysuria, hematuria or significant nocturia. Patient denies any problems walking, swelling in the joints or loss of balance. Patient denies any skin changes, loss of hair or loss of weight. Patient denies any excessive worrying or anxiety or significant depression. Patient denies any problems with insomnia. Patient denies excessive thirst, polyuria, polydipsia. Patient denies any swollen glands, patient denies easy bruising or easy bleeding. Patient denies any recent infections, allergies or URI. Patient "s visual fields have not changed significantly in recent time.   PHYSICAL EXAM: BP 138/81   Pulse 72   Temp 98.1 F (36.7 C)   Resp 18   Ht 5' 9"$  (1.753 m)   Wt 256 lb 6.4 oz (116.3 kg)   BMI 37.86 kg/m  Well-developed well-nourished patient in NAD. HEENT reveals PERLA, EOMI, discs not visualized.  Oral cavity is clear. No oral mucosal lesions are identified. Neck is clear without evidence of cervical or  supraclavicular adenopathy. Lungs are clear to A&P. Cardiac examination is essentially unremarkable with regular rate and rhythm without murmur rub or thrill. Abdomen is benign with no organomegaly or masses noted. Motor sensory and DTR levels are equal and symmetric in the upper and lower extremities. Cranial nerves II through XII are grossly intact. Proprioception is intact. No peripheral adenopathy or edema is identified. No motor or sensory levels are noted. Crude visual fields are within normal range.  LABORATORY DATA: Pathology reports reviewed    RADIOLOGY RESULTS: CT scan PET CT scan reviewed MRI scan to be reviewed when available   IMPRESSION: Locally advanced adenocarcinoma of the distal esophagus in 67 year old male  PLAN: At this time would plan on concurrent chemoradiation therapy.  Would use IMRT treatment planning and delivery to the esophagus treating up to [place] with concurrent chemotherapy.  Risks and benefits of treatment including possible worsening of his dysphagia fatigue alteration of blood counts skin reaction all were described in detail to the patient.  Even if his liver is involved would continue with concurrent chemoradiation therapy as described above.  Will coordinate his chemotherapy with medical oncology.  There will be extra effort by both professional  staff as well as technical staff to coordinate and manage concurrent chemoradiation and ensuing side effects during his treatments. Patient comprehends my recommendations well.  I would like to take this opportunity to thank you for allowing me to participate in the care of your patient.Noreene Filbert, MD

## 2022-09-19 NOTE — H&P (View-Only) (Signed)
NEW PATIENT EVALUATION  Name: Jonathan [name]  MRN: [id number]  Date:   09/19/2022     DOB: 02/05/1956   This 66 y.o. 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  REFERRING PHYSICIAN: Hedrick, James, MD  CHIEF COMPLAINT:  Chief Complaint  Patient presents with   Esophageal Cancer    DIAGNOSIS: The encounter diagnosis was Adenocarcinoma of esophagus (HCC).   PREVIOUS INVESTIGATIONS:  CT scans PET scan reviewed MRI pending Clinical notes reviewed Pathology report reviewed  HPI: Patient is a 66-year-old male who presented with food sticking in his esophagus.  He was evaluated by GI and upper endoscopy showed a large fungating mass found in the middle to lower third of the esophagus 29 to 37 cm from the incisors partially obstructing and circumferential.  Biopsy was positive for adenocarcinoma.  Stomach was endoscopically normal.  Endoscopically and sonographically there was evidence of suggestion into the adventitia.  PET CT scan was performed showing hypermetabolic activity in the mid to lower esophagus.  There was also hypermetabolic activity in the gastrohepatic ligament nodal metastasis.  There is also a dome of the liver hypermetabolic lesion concerning for metastatic disease and MRI scan is planned for tomorrow.  He is seen today is doing fairly well although he still states food is hanging up in his food pipe.  He has been seen by medical oncology with recommendation for concurrent chemoradiation.  PLANNED TREATMENT REGIMEN: Concurrent chemoradiation  PAST MEDICAL HISTORY:  has a past medical history of Arthritis, Complication of anesthesia, Esophageal mass, Hypertension, Hypothyroidism, and PONV (postoperative nausea and vomiting).    PAST SURGICAL HISTORY:  Past Surgical History:  Procedure Laterality Date   COLONOSCOPY     ESOPHAGOGASTRODUODENOSCOPY N/A 08/23/2022    Procedure: ESOPHAGOGASTRODUODENOSCOPY (EGD);  Surgeon: Russo, Steven Michael, DO;  Location: [facility] ENDOSCOPY;  Service: Gastroenterology;  Laterality: N/A;   EUS N/A 09/12/2022   Procedure: FULL UPPER ENDOSCOPIC ULTRASOUND (EUS) RADIAL;  Surgeon: Burbridge, Rebecca A, MD;  Location: [facility] ENDOSCOPY;  Service: Gastroenterology;  Laterality: N/A;   FINGER SURGERY     TENDON REPAIR IN LEFT KNEE      FAMILY HISTORY: family history includes Breast cancer in his sister; Heart attack in his mother; Prostate cancer in his father and paternal uncle.  SOCIAL HISTORY:  reports that he has never smoked. He has never used smokeless tobacco. He reports current alcohol use. He reports that he does not use drugs.  ALLERGIES: Patient has no known allergies.  MEDICATIONS:  Current Outpatient Medications  Medication Sig Dispense Refill   acetaminophen (TYLENOL) 650 MG CR tablet Take 1,300 mg by mouth every 8 (eight) hours as needed for pain.     amLODipine (NORVASC) 5 MG tablet Take 5 mg by mouth daily.     atenolol (TENORMIN) 50 MG tablet Take 50 mg by mouth daily.     Cholecalciferol (VITAMIN D3) 125 MCG (5000 UT) CAPS Take 5,000 Units by mouth daily.     hydrochlorothiazide (HYDRODIURIL) 25 MG tablet Take 25 mg by mouth daily.     levothyroxine (SYNTHROID) 100 MCG tablet Take 100 mcg by mouth daily before breakfast.     Multiple Vitamins-Minerals (MULTIVITAMIN WITH MINERALS) tablet Take 1 tablet by mouth daily.     omeprazole (PRILOSEC) 20 MG capsule Take 20 mg by mouth daily.     ondansetron (ZOFRAN) 8 MG tablet Take 1 tablet (8 mg total) by mouth every   8 (eight) hours as needed for nausea or vomiting. Start on the third day after chemotherapy. 30 tablet 1   potassium chloride SA (KLOR-CON M) 20 MEQ tablet Take 1 tablet (20 mEq total) by mouth daily. 14 tablet 0   prochlorperazine (COMPAZINE) 10 MG tablet Take 1 tablet (10 mg total) by mouth every 6 (six) hours as needed for nausea or vomiting. 30 tablet  1   Turmeric 500 MG CAPS Take 500 mg by mouth daily.     vitamin C (ASCORBIC ACID) 500 MG tablet Take 500 mg by mouth daily.     No current facility-administered medications for this encounter.    ECOG PERFORMANCE STATUS:  1 - Symptomatic but completely ambulatory  REVIEW OF SYSTEMS: Patient denies any weight loss, fatigue, weakness, fever, chills or night sweats. Patient denies any loss of vision, blurred vision. Patient denies any ringing  of the ears or hearing loss. No irregular heartbeat. Patient denies heart murmur or history of fainting. Patient denies any chest pain or pain radiating to her upper extremities. Patient denies any shortness of breath, difficulty breathing at night, cough or hemoptysis. Patient denies any swelling in the lower legs. Patient denies any nausea vomiting, vomiting of blood, or coffee ground material in the vomitus. Patient denies any stomach pain. Patient states has had normal bowel movements no significant constipation or diarrhea. Patient denies any dysuria, hematuria or significant nocturia. Patient denies any problems walking, swelling in the joints or loss of balance. Patient denies any skin changes, loss of hair or loss of weight. Patient denies any excessive worrying or anxiety or significant depression. Patient denies any problems with insomnia. Patient denies excessive thirst, polyuria, polydipsia. Patient denies any swollen glands, patient denies easy bruising or easy bleeding. Patient denies any recent infections, allergies or URI. Patient "s visual fields have not changed significantly in recent time.   PHYSICAL EXAM: BP 138/81   Pulse 72   Temp 98.1 F (36.7 C)   Resp 18   Ht 5' 9" (1.753 m)   Wt 256 lb 6.4 oz (116.3 kg)   BMI 37.86 kg/m  Well-developed well-nourished patient in NAD. HEENT reveals PERLA, EOMI, discs not visualized.  Oral cavity is clear. No oral mucosal lesions are identified. Neck is clear without evidence of cervical or  supraclavicular adenopathy. Lungs are clear to A&P. Cardiac examination is essentially unremarkable with regular rate and rhythm without murmur rub or thrill. Abdomen is benign with no organomegaly or masses noted. Motor sensory and DTR levels are equal and symmetric in the upper and lower extremities. Cranial nerves II through XII are grossly intact. Proprioception is intact. No peripheral adenopathy or edema is identified. No motor or sensory levels are noted. Crude visual fields are within normal range.  LABORATORY DATA: Pathology reports reviewed    RADIOLOGY RESULTS: CT scan PET CT scan reviewed MRI scan to be reviewed when available   IMPRESSION: Locally advanced adenocarcinoma of the distal esophagus in 66-year-old male  PLAN: At this time would plan on concurrent chemoradiation therapy.  Would use IMRT treatment planning and delivery to the esophagus treating up to 54 Gray with concurrent chemotherapy.  Risks and benefits of treatment including possible worsening of his dysphagia fatigue alteration of blood counts skin reaction all were described in detail to the patient.  Even if his liver is involved would continue with concurrent chemoradiation therapy as described above.  Will coordinate his chemotherapy with medical oncology.  There will be extra effort by both professional   staff as well as technical staff to coordinate and manage concurrent chemoradiation and ensuing side effects during his treatments. Patient comprehends my recommendations well.  I would like to take this opportunity to thank you for allowing me to participate in the care of your patient..  Ozzy Bohlken, MD         

## 2022-09-20 ENCOUNTER — Ambulatory Visit
Admission: RE | Admit: 2022-09-20 | Discharge: 2022-09-20 | Disposition: A | Discharge status: Home / Self Care | Payer: No Typology Code available for payment source | Source: Ambulatory Visit | Attending: Oncology

## 2022-09-20 ENCOUNTER — Other Ambulatory Visit: Payer: Self-pay

## 2022-09-20 DIAGNOSIS — C159 Malignant neoplasm of esophagus, unspecified: Secondary | ICD-10-CM

## 2022-09-20 DIAGNOSIS — K5730 Diverticulosis of large intestine without perforation or abscess without bleeding: Secondary | ICD-10-CM | POA: Diagnosis not present

## 2022-09-20 DIAGNOSIS — K760 Fatty (change of) liver, not elsewhere classified: Secondary | ICD-10-CM | POA: Diagnosis not present

## 2022-09-20 DIAGNOSIS — R590 Localized enlarged lymph nodes: Secondary | ICD-10-CM | POA: Diagnosis not present

## 2022-09-20 MED ORDER — GADOBUTROL 1 MMOL/ML IV SOLN
10.0000 mL | Freq: Once | INTRAVENOUS | Status: AC | PRN
  Administered 2022-09-20: 10 mL via INTRAVENOUS

## 2022-09-23 ENCOUNTER — Inpatient Hospital Stay: Payer: No Typology Code available for payment source | Attending: Oncology

## 2022-09-24 ENCOUNTER — Telehealth: Payer: Self-pay

## 2022-09-24 ENCOUNTER — Other Ambulatory Visit: Payer: Self-pay

## 2022-09-24 DIAGNOSIS — C159 Malignant neoplasm of esophagus, unspecified: Secondary | ICD-10-CM

## 2022-09-24 NOTE — Telephone Encounter (Signed)
Call placed to Jonathan [name]. Results of MRI have been reviewed with Dr. [name] and Dr. Baruch Gouty. They are developing final treatment plan. Dr. [name] has requested liver biopsy to confirm mets. This has been ordered and checklist sent to IR. He will keep his appointment with Dr. Baruch Gouty and they will discuss final treatment once finalized.

## 2022-09-25 ENCOUNTER — Other Ambulatory Visit: Payer: Self-pay

## 2022-09-25 ENCOUNTER — Ambulatory Visit: Admission: RE | Admit: 2022-09-25 | Payer: No Typology Code available for payment source | Source: Ambulatory Visit

## 2022-09-25 DIAGNOSIS — C159 Malignant neoplasm of esophagus, unspecified: Secondary | ICD-10-CM | POA: Insufficient documentation

## 2022-09-26 DIAGNOSIS — J019 Acute sinusitis, unspecified: Secondary | ICD-10-CM | POA: Diagnosis not present

## 2022-09-26 DIAGNOSIS — R0982 Postnasal drip: Secondary | ICD-10-CM | POA: Diagnosis not present

## 2022-09-26 DIAGNOSIS — Z03818 Encounter for observation for suspected exposure to other biological agents ruled out: Secondary | ICD-10-CM | POA: Diagnosis not present

## 2022-09-27 ENCOUNTER — Telehealth: Payer: Self-pay

## 2022-09-27 NOTE — Telephone Encounter (Signed)
Liver biopsy has been scheduled for 10/10/22. Arrive at the [facility] center at 0730 for 0830 appointment. Do not eat or drink after midnight. Will require driver. Spoke with Jonathan [name] and details reviewed.

## 2022-10-02 DIAGNOSIS — C155 Malignant neoplasm of lower third of esophagus: Secondary | ICD-10-CM | POA: Insufficient documentation

## 2022-10-02 DIAGNOSIS — Z510 Encounter for antineoplastic radiation therapy: Secondary | ICD-10-CM | POA: Diagnosis not present

## 2022-10-02 DIAGNOSIS — C159 Malignant neoplasm of esophagus, unspecified: Secondary | ICD-10-CM | POA: Insufficient documentation

## 2022-10-03 ENCOUNTER — Ambulatory Visit: Payer: No Typology Code available for payment source

## 2022-10-04 ENCOUNTER — Other Ambulatory Visit: Payer: Self-pay | Admitting: Oncology

## 2022-10-04 NOTE — Progress Notes (Signed)
DISCONTINUE ON PATHWAY REGIMEN - Gastroesophageal     Administer weekly during RT:     Paclitaxel      Carboplatin   **Always confirm dose/schedule in your pharmacy ordering system**  REASON: Disease Progression PRIOR TREATMENT: [id number]: Carboplatin + Paclitaxel (2/50) Weekly (x 5-6 Weeks) with Concurrent RT TREATMENT RESPONSE: Progressive Disease (PD)  START ON PATHWAY REGIMEN - Gastroesophageal     A cycle is every 14 days:     Oxaliplatin      Leucovorin      Fluorouracil      Fluorouracil   **Always confirm dose/schedule in your pharmacy ordering system**  Patient Characteristics: Distant Metastases (cM1/pM1) / Locally Recurrent Disease, Adenocarcinoma - Esophageal, GE Junction, and Gastric, First Line, HER2 Negative/Unknown, PD?L1 Expression CPS < 5/Negative/Unknown, MSS/pMMR or MSI Unknown Therapeutic Status: Distant Metastases (No Additional Staging) Histology: Adenocarcinoma Disease Classification: Esophageal Line of Therapy: First Line HER2 Status: Negative PD-L1 Expression Status: Awaiting Test Results Microsatellite/Mismatch Repair Status: Unknown Intent of Therapy: Non-Curative / Palliative Intent, Discussed with Patient

## 2022-10-07 ENCOUNTER — Ambulatory Visit: Payer: No Typology Code available for payment source

## 2022-10-08 ENCOUNTER — Ambulatory Visit: Payer: No Typology Code available for payment source

## 2022-10-08 DIAGNOSIS — M62830 Muscle spasm of back: Secondary | ICD-10-CM | POA: Diagnosis not present

## 2022-10-08 DIAGNOSIS — M9903 Segmental and somatic dysfunction of lumbar region: Secondary | ICD-10-CM | POA: Diagnosis not present

## 2022-10-08 DIAGNOSIS — M9902 Segmental and somatic dysfunction of thoracic region: Secondary | ICD-10-CM | POA: Diagnosis not present

## 2022-10-08 DIAGNOSIS — M9901 Segmental and somatic dysfunction of cervical region: Secondary | ICD-10-CM | POA: Diagnosis not present

## 2022-10-08 NOTE — H&P (Signed)
Chief Complaint: Patient was seen in consultation today for liver lesion at the request of Yu,Zhou  Referring Physician(s): Yu,Zhou  Supervising Physician: Michaelle Birks  Patient Status: [facility] - Out-pt  History of Present Illness: Jonathan [name] is a 67 y.o. male with PMHx of HTN, GERD and complaints of dysphagia leading to barium swallow on 08/15/22 followed by EGD 1/26 and biopsy of esophageal mass. Pathology of esophageal biopsy revealed adenocarcinoma in the distal esophagus, PET imaging done 09/06/22 with follow up MRI abdomen for liver evaluation, findings of liver hypermetabolic lesions in segments VII and VI suspicious for metastatic disease. Request received for IR biopsy of liver mass.   The patient denies any current chest pain or shortness of breath, he denies any current abdominal pain or nausea. He denies any current blood thinner use, denies any known bleeding or clotting disorder. The patient does have a history of sleep apnea and uses a CPAP. He has no known complications to sedation.    Past Medical History:  Diagnosis Date   Arthritis    Complication of anesthesia    OCCURRED ONCE YEARS AGO 1981   Esophageal mass    Hypertension    Hypothyroidism    PONV (postoperative nausea and vomiting)     Past Surgical History:  Procedure Laterality Date   COLONOSCOPY     ESOPHAGOGASTRODUODENOSCOPY N/A 08/23/2022   Procedure: ESOPHAGOGASTRODUODENOSCOPY (EGD);  Surgeon: Annamaria Helling, DO;  Location: [facility] ENDOSCOPY;  Service: Gastroenterology;  Laterality: N/A;   EUS N/A 09/12/2022   Procedure: FULL UPPER ENDOSCOPIC ULTRASOUND (EUS) RADIAL;  Surgeon: Holly Bodily, MD;  Location: [facility] ENDOSCOPY;  Service: Gastroenterology;  Laterality: N/A;   FINGER SURGERY     TENDON REPAIR IN LEFT KNEE      Allergies: Patient has no known allergies.  Medications: Prior to Admission medications   Medication Sig Start Date End Date Taking? Authorizing Provider   acetaminophen (TYLENOL) 650 MG CR tablet Take 1,300 mg by mouth every 8 (eight) hours as needed for pain.    [provider]  amLODipine (NORVASC) 5 MG tablet Take 5 mg by mouth daily.    [provider]  atenolol (TENORMIN) 50 MG tablet Take 50 mg by mouth daily.    [provider]  Cholecalciferol (VITAMIN D3) 125 MCG (5000 UT) CAPS Take 5,000 Units by mouth daily.    [provider]  hydrochlorothiazide (HYDRODIURIL) 25 MG tablet Take 25 mg by mouth daily. 06/09/22   [provider]  levothyroxine (SYNTHROID) 100 MCG tablet Take 100 mcg by mouth daily before breakfast.    [provider]  Multiple Vitamins-Minerals (MULTIVITAMIN WITH MINERALS) tablet Take 1 tablet by mouth daily.    [provider]  omeprazole (PRILOSEC) 20 MG capsule Take 20 mg by mouth daily. 08/16/22 08/16/23  [provider]  potassium chloride SA (KLOR-CON M) 20 MEQ tablet Take 1 tablet (20 mEq total) by mouth daily. 08/28/22   Earlie Server, MD  Turmeric 500 MG CAPS Take 500 mg by mouth daily.    [provider]  vitamin C (ASCORBIC ACID) 500 MG tablet Take 500 mg by mouth daily.    [provider]     Family History  Problem Relation Age of Onset   Heart attack Mother    Prostate cancer Father    Breast cancer Sister    Prostate cancer Paternal Uncle     Social History   Socioeconomic History   Marital status: Single  Spouse name: Not on file   Number of children: Not on file   Years of education: Not on file   Highest education level: Not on file  Occupational History   Not on file  Tobacco Use   Smoking status: Never   Smokeless tobacco: Never  Vaping Use   Vaping Use: Never used  Substance and Sexual Activity   Alcohol use: Yes    Comment: OCCASIONALLY   Drug use: Never   Sexual activity: Not on file  Other Topics Concern   Not on file  Social History Narrative   Not on file   Social Determinants of Health    Financial Resource Strain: Not on file  Food Insecurity: No Food Insecurity (08/28/2022)   Hunger Vital Sign    Worried About Running Out of Food in the Last Year: Never true    Ran Out of Food in the Last Year: Never true  Transportation Needs: No Transportation Needs (08/28/2022)   PRAPARE - Hydrologist (Medical): No    Lack of Transportation (Non-Medical): No  Physical Activity: Not on file  Stress: Not on file  Social Connections: Not on file    Review of Systems: A 12 point ROS discussed and pertinent positives are indicated in the HPI above.  All other systems are negative.  Review of Systems  Vital Signs: BP (!) 130/95   Pulse 64   Temp 98 F (36.7 C) (Oral)   Resp 20   Ht '5\' 9"'$  (1.753 m)   Wt 245 lb (111.1 kg)   SpO2 95%   BMI 36.18 kg/m   Physical Exam Constitutional:      General: He is not in acute distress. HENT:     Head: Normocephalic and atraumatic.  Cardiovascular:     Rate and Rhythm: Normal rate and regular rhythm.  Pulmonary:     Effort: Pulmonary effort is normal. No respiratory distress.     Breath sounds: Normal breath sounds.  Abdominal:     General: There is no distension.     Palpations: Abdomen is soft.     Tenderness: There is no abdominal tenderness.  Neurological:     Mental Status: He is alert and oriented to person, place, and time.    Imaging: MR ABDOMEN W WO CONTRAST  Result Date: 09/23/2022 CLINICAL DATA:  Further evaluation of possible hepatic lesion seen on prior PET-CT. EXAM: MRI ABDOMEN WITHOUT AND WITH CONTRAST TECHNIQUE: Multiplanar multisequence MR imaging of the abdomen was performed both before and after the administration of intravenous contrast. CONTRAST:  25m GADAVIST GADOBUTROL 1 MMOL/ML IV SOLN COMPARISON:  PET-CT September 05, 2022, CT August 26, 2022 and CT March 08, 2014. FINDINGS: Lower chest: No acute abnormality. Hepatobiliary: Diffuse hepatic steatosis. Mildly T2 hyperintense  segment VII hepatic lesion measuring 2.7 x 2.3 cm on image 8/12 is hypointense on T1 with a rim of postcontrast enhancement which persists on delayed postcontrast pulse sequences. Tiny focus of delayed enhancement in the inferior right lobe of the liver segment VI measuring 8 mm on image 54/20 with ill-defined increased T2 signal in this area on image 23/7 and subtle corresponding reduced diffusivity on image 87/8. Scattered benign tiny hepatic cysts. Gallbladder is unremarkable.  No biliary ductal dilation. Pancreas: No pancreatic ductal dilation or evidence of acute inflammation. There are few T2 hyperintense foci in the pancreatic body and tail measuring up to 4 mm on image 15/4 Spleen:  No splenomegaly. Adrenals/Urinary Tract: Bilateral adrenal glands  are within normal limits. No hydronephrosis. Left renal cysts are considered benign requiring no independent imaging follow-up. Stomach/Bowel: Partially visualized distal esophageal wall thickening compatible with the patient's known primary neoplasm. Colonic diverticulosis without findings of acute diverticulitis. Vascular/Lymphatic: Similar size of the enlarged gastrohepatic ligament lymph node measuring 11 mm in short axis on image 19/4 which was mildly metabolic on prior PET-CT and measured 10 mm. Normal caliber abdominal aorta.  Smooth IVC contours. Other:  None. Musculoskeletal: No suspicious bone lesions identified. IMPRESSION: 1. Mildly T2 hyperintense segment VII hepatic lesion measuring 2.7 cm with imaging characteristics compatible with metastatic disease. 2. Tiny focus of delayed enhancement in the inferior right lobe of the liver segment VI measuring 8 mm with ill-defined increased T2 signal and subtle corresponding reduced diffusivity, also suspicious for metastatic disease. 3. Partially visualized distal esophageal wall thickening compatible with the patient's known primary neoplasm. 4. Similar size of the 11 mm gastrohepatic ligament lymph node  mildly metabolic on prior PET-CT and compatible with local nodal disease involvement. 5. 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 Recommend follow up pre and post-contrast MRI/MRCP in 1 year. 6. Diffuse hepatic steatosis. Electronically Signed   By: Dahlia Bailiff M.D.   On: 09/23/2022 11:43    Labs:  CBC: Recent Labs    08/28/22 1045 10/10/22 0747  WBC 9.4 6.9  HGB 14.0 13.0  HCT 42.6 40.1  PLT 369 306    COAGS: Recent Labs    10/10/22 0747  INR 1.1    BMP: Recent Labs    08/28/22 1045 10/10/22 0747  NA 138 137  K 2.9* 3.1*  CL 99 106  CO2 27 24  GLUCOSE 100* 118*  BUN 10 11  CALCIUM 9.3 8.9  CREATININE 1.12 0.96  GFRNONAA >60 >60    LIVER FUNCTION TESTS: Recent Labs    08/28/22 1045 10/10/22 0747  BILITOT 0.5 0.8  AST 34 40  ALT 46* 49*  ALKPHOS 43 49  PROT 7.9 7.5  ALBUMIN 4.2 3.7    Assessment and Plan: This is a 67 year old male with PMHx of HTN, GERD and complaints of dysphagia leading to barium swallow on 08/15/22 followed by EGD 1/26 and biopsy of esophageal mass. Pathology of esophageal biopsy revealed adenocarcinoma in the distal esophagus, PET imaging done 09/06/22 with follow up MRI abdomen for liver evaluation, findings of liver hypermetabolic lesions in segments VII and VI suspicious for metastatic disease. Request received for IR biopsy of liver mass.   The patient has been NPO, no blood thinners taken, imaging, labs and vitals have been reviewed.  Risks and benefits of image guided liver mass biopsy with moderate sedation was discussed with the patient and patient's family including, but not limited to bleeding, infection, damage to adjacent structures or low yield requiring additional tests.  All of the questions were answered and there is agreement to proceed.  Consent signed and in chart.   Thank you for this interesting consult.  I greatly enjoyed meeting  Jonathan [name] and look forward to participating in their care.  A copy of this report was sent to the requesting provider on this date.  Electronically Signed: Hedy Jacob, PA-C 10/10/2022, 8:43 AM   I spent a total of 15 Minutes in face to face in clinical consultation, greater than 50% of which was counseling/coordinating care for liver lesion biopsy.

## 2022-10-09 ENCOUNTER — Other Ambulatory Visit: Payer: Self-pay | Admitting: Student

## 2022-10-09 ENCOUNTER — Ambulatory Visit: Payer: No Typology Code available for payment source

## 2022-10-09 DIAGNOSIS — K769 Liver disease, unspecified: Secondary | ICD-10-CM

## 2022-10-09 NOTE — Progress Notes (Signed)
Patient for CT guided Liver Mass Biopsy on Thurs 10/10/2022, I called and spoke with the patient on the phone and gave pre-procedure instructions. Pt was made aware to be here at 7:30a, NPO after MN prior to procedure as well as driver post procedure/recovery/discharge. Pt stated understanding.  Called 10/09/2022

## 2022-10-10 ENCOUNTER — Ambulatory Visit: Payer: No Typology Code available for payment source

## 2022-10-10 ENCOUNTER — Ambulatory Visit
Admission: RE | Admit: 2022-10-10 | Discharge: 2022-10-10 | Disposition: A | Discharge status: Home / Self Care | Payer: No Typology Code available for payment source | Source: Ambulatory Visit | Attending: Oncology | Admitting: Oncology

## 2022-10-10 DIAGNOSIS — C155 Malignant neoplasm of lower third of esophagus: Secondary | ICD-10-CM | POA: Insufficient documentation

## 2022-10-10 DIAGNOSIS — C787 Secondary malignant neoplasm of liver and intrahepatic bile duct: Secondary | ICD-10-CM | POA: Insufficient documentation

## 2022-10-10 DIAGNOSIS — R160 Hepatomegaly, not elsewhere classified: Secondary | ICD-10-CM | POA: Diagnosis not present

## 2022-10-10 DIAGNOSIS — K769 Liver disease, unspecified: Secondary | ICD-10-CM | POA: Insufficient documentation

## 2022-10-10 DIAGNOSIS — C159 Malignant neoplasm of esophagus, unspecified: Secondary | ICD-10-CM

## 2022-10-10 DIAGNOSIS — Z8501 Personal history of malignant neoplasm of esophagus: Secondary | ICD-10-CM | POA: Diagnosis not present

## 2022-10-10 LAB — CBC WITH DIFFERENTIAL/PLATELET
Abs Immature Granulocytes: 0.02 10*3/uL (ref 0.00–0.07)
Basophils Absolute: 0.1 10*3/uL (ref 0.0–0.1)
Basophils Relative: 2 %
Eosinophils Absolute: 0.2 10*3/uL (ref 0.0–0.5)
Eosinophils Relative: 3 %
HCT: 40.1 % (ref 39.0–52.0)
Hemoglobin: 13 g/dL (ref 13.0–17.0)
Immature Granulocytes: 0 %
Lymphocytes Relative: 23 %
Lymphs Abs: 1.6 10*3/uL (ref 0.7–4.0)
MCH: 26.9 pg (ref 26.0–34.0)
MCHC: 32.4 g/dL (ref 30.0–36.0)
MCV: 82.9 fL (ref 80.0–100.0)
Monocytes Absolute: 0.5 10*3/uL (ref 0.1–1.0)
Monocytes Relative: 8 %
Neutro Abs: 4.5 10*3/uL (ref 1.7–7.7)
Neutrophils Relative %: 64 %
Platelets: 306 10*3/uL (ref 150–400)
RBC: 4.84 MIL/uL (ref 4.22–5.81)
RDW: 13.4 % (ref 11.5–15.5)
WBC: 6.9 10*3/uL (ref 4.0–10.5)
nRBC: 0 % (ref 0.0–0.2)

## 2022-10-10 LAB — PROTIME-INR
INR: 1.1 (ref 0.8–1.2)
Prothrombin Time: 13.7 seconds (ref 11.4–15.2)

## 2022-10-10 LAB — COMPREHENSIVE METABOLIC PANEL
ALT: 49 U/L — ABNORMAL HIGH (ref 0–44)
AST: 40 U/L (ref 15–41)
Albumin: 3.7 g/dL (ref 3.5–5.0)
Alkaline Phosphatase: 49 U/L (ref 38–126)
Anion gap: 7 (ref 5–15)
BUN: 11 mg/dL (ref 8–23)
CO2: 24 mmol/L (ref 22–32)
Calcium: 8.9 mg/dL (ref 8.9–10.3)
Chloride: 106 mmol/L (ref 98–111)
Creatinine, Ser: 0.96 mg/dL (ref 0.61–1.24)
GFR, Estimated: >60 mL/min (ref >60)
Glucose, Bld: 118 mg/dL — ABNORMAL HIGH (ref 70–99)
Potassium: 3.1 mmol/L — ABNORMAL LOW (ref 3.5–5.1)
Sodium: 137 mmol/L (ref 135–145)
Total Bilirubin: 0.8 mg/dL (ref 0.3–1.2)
Total Protein: 7.5 g/dL (ref 6.5–8.1)

## 2022-10-10 MED ORDER — MIDAZOLAM HCL 5 MG/5ML IJ SOLN
INTRAMUSCULAR | Status: AC | PRN
  Administered 2022-10-10 (×2): 1 mg via INTRAVENOUS

## 2022-10-10 MED ORDER — FENTANYL CITRATE (PF) 100 MCG/2ML IJ SOLN
INTRAMUSCULAR | Status: AC | PRN
  Administered 2022-10-10 (×2): 50 ug via INTRAVENOUS

## 2022-10-10 MED ORDER — MIDAZOLAM HCL 2 MG/2ML IJ SOLN
INTRAMUSCULAR | Status: AC
  Filled 2022-10-10: qty 2

## 2022-10-10 MED ORDER — SODIUM CHLORIDE 0.9 % IV SOLN: INTRAVENOUS | Status: DC

## 2022-10-10 MED ORDER — FENTANYL CITRATE (PF) 100 MCG/2ML IJ SOLN
INTRAMUSCULAR | Status: AC
  Filled 2022-10-10: qty 2

## 2022-10-10 MED ORDER — LIDOCAINE HCL 1 % IJ SOLN
INTRAMUSCULAR | Status: AC | PRN
  Administered 2022-10-10: 10 mL

## 2022-10-10 NOTE — Procedures (Signed)
Vascular and Interventional Radiology Procedure Note  Patient: Jonathan [name] DOB: 1955/12/13 Medical Record Number: [id number] Note Date/Time: 10/10/22 8:57 AM   Performing Physician: Michaelle Birks, MD Assistant(s): None  Diagnosis: Esophageal CA w liver mass   Procedure: LIVER MASS BIOPSY  Anesthesia: Conscious Sedation Complications: None Estimated Blood Loss: Minimal Specimens: Sent for Pathology  Findings:  Successful CT-guided biopsy of R hepatic lobe mass. A total of 4 samples were obtained. Hemostasis of the tract was achieved using Gelfoam Slurry Embolization.  Plan: Bed rest for 2 hours.  See detailed procedure note with images in PACS. The patient tolerated the procedure well without incident or complication and was returned to Recovery in stable condition.    Michaelle Birks, MD [facility] Inter[facility] Radiology   Pager. [place]

## 2022-10-10 NOTE — Progress Notes (Signed)
Patient clinically stable post Liver CT biopsy per Dr Maryelizabeth Kaufmann, tolerated well. Vitals stable pre and post procedure. Received Versed 2 mg along with Fentanyl 100 mcg IV for procedure. Report given to Fransico Michael RN post procedure/331

## 2022-10-10 NOTE — Progress Notes (Signed)
Reviewed with pt. K+ level. Pt. Called primary MD to request K+ supplements. Pt. States "I've taken K+ before because I'm on the H2O pill." Pt. States "I'll get some more to take." Pt. Asymptomatic with K+ level at present.

## 2022-10-11 ENCOUNTER — Other Ambulatory Visit: Payer: Self-pay | Admitting: Pathology

## 2022-10-11 ENCOUNTER — Other Ambulatory Visit: Payer: Self-pay | Admitting: Oncology

## 2022-10-11 ENCOUNTER — Other Ambulatory Visit: Payer: Self-pay

## 2022-10-11 ENCOUNTER — Telehealth: Payer: Self-pay

## 2022-10-11 ENCOUNTER — Ambulatory Visit: Payer: No Typology Code available for payment source

## 2022-10-11 DIAGNOSIS — C159 Malignant neoplasm of esophagus, unspecified: Secondary | ICD-10-CM

## 2022-10-11 LAB — SURGICAL PATHOLOGY

## 2022-10-11 MED ORDER — ONDANSETRON HCL 8 MG PO TABS: 8.0000 mg | ORAL_TABLET | Freq: Three times a day (TID) | ORAL | 1 refills | Status: DC | PRN

## 2022-10-11 MED ORDER — LIDOCAINE-PRILOCAINE 2.5-2.5 % EX CREA: TOPICAL_CREAM | CUTANEOUS | 6 refills | Status: DC

## 2022-10-11 MED ORDER — PROCHLORPERAZINE MALEATE 10 MG PO TABS: 10.0000 mg | ORAL_TABLET | Freq: Four times a day (QID) | ORAL | 1 refills | Status: DC | PRN

## 2022-10-11 NOTE — Telephone Encounter (Signed)
Error

## 2022-10-11 NOTE — Telephone Encounter (Signed)
Per MD message: "I called patient and communicated surgical pathology. he has stage IV disease. I switched his chemo to FOLFOX. he will need medi port placement, refer to vascular surgeon, ask if port can be done next week. also he received carbo taxol as chemo education, now he needs more education on the FOLFOX. plan is lab MD FOLFOX 3/25, or 3/26 with D3 pump dc he is aware about the plan. also need [employer] NGS sent out, although I forget to mention. please update him when any of you call him next time. thanks. [phone]. I have discussed with Dr. Baruch Gouty last week. he recommends RT, he postponed to 3/25 to wait for biopsy"  Per message Steffanie Dunn will work on [employer].   Message sent to [facility] to see if additional information can be done via phone.   referral to vascular has been entered for placement

## 2022-10-14 ENCOUNTER — Ambulatory Visit: Payer: No Typology Code available for payment source

## 2022-10-14 ENCOUNTER — Encounter: Payer: Self-pay | Admitting: Oncology

## 2022-10-14 ENCOUNTER — Telehealth: Payer: Self-pay

## 2022-10-14 DIAGNOSIS — Z125 Encounter for screening for malignant neoplasm of prostate: Secondary | ICD-10-CM | POA: Diagnosis not present

## 2022-10-14 DIAGNOSIS — R7303 Prediabetes: Secondary | ICD-10-CM | POA: Diagnosis not present

## 2022-10-14 DIAGNOSIS — I10 Essential (primary) hypertension: Secondary | ICD-10-CM | POA: Diagnosis not present

## 2022-10-14 NOTE — Telephone Encounter (Signed)
[employer] NGS and PD-L1 [id number] IHC requested on [id number], liver biopsy, collected 10/10/2022.

## 2022-10-15 ENCOUNTER — Encounter: Payer: Self-pay | Admitting: Oncology

## 2022-10-15 ENCOUNTER — Other Ambulatory Visit: Payer: No Typology Code available for payment source

## 2022-10-15 ENCOUNTER — Telehealth (INDEPENDENT_AMBULATORY_CARE_PROVIDER_SITE_OTHER): Payer: Self-pay

## 2022-10-15 ENCOUNTER — Ambulatory Visit: Payer: No Typology Code available for payment source

## 2022-10-15 NOTE — Telephone Encounter (Signed)
Port a cath scheduled for 3/28

## 2022-10-15 NOTE — Progress Notes (Signed)
T/C to patient for chemo education.   Had educated patient on Taxol Carbo but treatment changed to FOLFOX.   Education provided and patient verbalized understanding.

## 2022-10-15 NOTE — Telephone Encounter (Signed)
Spoke with the patient and he is scheduled with Dr. Lucky Cowboy on 10/24/22 with a 12:30 pm arrival time to the [facility]. Pre-procedure instructions were discussed and will be sent to [facility]. Patient was offered 10/17/22 and declined.

## 2022-10-16 ENCOUNTER — Ambulatory Visit: Payer: No Typology Code available for payment source

## 2022-10-17 ENCOUNTER — Ambulatory Visit
Admission: RE | Admit: 2022-10-17 | Discharge: 2022-10-17 | Disposition: A | Discharge status: Home / Self Care | Payer: No Typology Code available for payment source | Source: Ambulatory Visit | Attending: Vascular Surgery | Admitting: Vascular Surgery

## 2022-10-17 ENCOUNTER — Encounter: Payer: Self-pay | Admitting: Vascular Surgery

## 2022-10-17 ENCOUNTER — Other Ambulatory Visit: Payer: No Typology Code available for payment source

## 2022-10-17 ENCOUNTER — Inpatient Hospital Stay: Payer: No Typology Code available for payment source

## 2022-10-17 ENCOUNTER — Encounter
Admission: RE | Disposition: A | Discharge status: Home / Self Care | Payer: Self-pay | Source: Ambulatory Visit | Attending: Vascular Surgery

## 2022-10-17 ENCOUNTER — Ambulatory Visit
Admission: RE | Admit: 2022-10-17 | Discharge: 2022-10-17 | Disposition: A | Discharge status: Home / Self Care | Payer: No Typology Code available for payment source | Source: Ambulatory Visit | Attending: Radiation Oncology | Admitting: Radiation Oncology

## 2022-10-17 ENCOUNTER — Other Ambulatory Visit: Payer: Self-pay

## 2022-10-17 ENCOUNTER — Ambulatory Visit: Payer: No Typology Code available for payment source

## 2022-10-17 DIAGNOSIS — C159 Malignant neoplasm of esophagus, unspecified: Secondary | ICD-10-CM | POA: Diagnosis not present

## 2022-10-17 HISTORY — PX: PORTA CATH INSERTION: CATH118285

## 2022-10-17 SURGERY — PORTA CATH INSERTION
Anesthesia: Moderate Sedation

## 2022-10-17 MED ORDER — ONDANSETRON HCL 4 MG/2ML IJ SOLN: 4.0000 mg | Freq: Four times a day (QID) | INTRAMUSCULAR | Status: DC | PRN

## 2022-10-17 MED ORDER — MIDAZOLAM HCL 2 MG/ML PO SYRP: 8.0000 mg | ORAL_SOLUTION | Freq: Once | ORAL | Status: DC | PRN

## 2022-10-17 MED ORDER — SODIUM CHLORIDE 0.9 % IV SOLN: INTRAVENOUS | Status: DC

## 2022-10-17 MED ORDER — CEFAZOLIN SODIUM-DEXTROSE 2-4 GM/100ML-% IV SOLN
INTRAVENOUS | Status: AC
  Administered 2022-10-17: 2 g via INTRAVENOUS
  Filled 2022-10-17: qty 100

## 2022-10-17 MED ORDER — FAMOTIDINE 20 MG PO TABS: 40.0000 mg | ORAL_TABLET | Freq: Once | ORAL | Status: DC | PRN

## 2022-10-17 MED ORDER — HYDROMORPHONE HCL 1 MG/ML IJ SOLN: 1.0000 mg | Freq: Once | INTRAMUSCULAR | Status: DC | PRN

## 2022-10-17 MED ORDER — FENTANYL CITRATE (PF) 100 MCG/2ML IJ SOLN
INTRAMUSCULAR | Status: DC | PRN
  Administered 2022-10-17: 50 ug via INTRAVENOUS

## 2022-10-17 MED ORDER — DIPHENHYDRAMINE HCL 50 MG/ML IJ SOLN: 50.0000 mg | Freq: Once | INTRAMUSCULAR | Status: DC | PRN

## 2022-10-17 MED ORDER — SODIUM CHLORIDE 0.9 % IV SOLN
80.0000 mg | Freq: Once | INTRAVENOUS | Status: DC
  Filled 2022-10-17: qty 2

## 2022-10-17 MED ORDER — CEFAZOLIN SODIUM-DEXTROSE 2-4 GM/100ML-% IV SOLN: 2.0000 g | INTRAVENOUS | Status: AC

## 2022-10-17 MED ORDER — MIDAZOLAM HCL 2 MG/2ML IJ SOLN
INTRAMUSCULAR | Status: DC | PRN
  Administered 2022-10-17: 2 mg via INTRAVENOUS

## 2022-10-17 MED ORDER — FENTANYL CITRATE (PF) 100 MCG/2ML IJ SOLN
INTRAMUSCULAR | Status: AC
  Filled 2022-10-17: qty 2

## 2022-10-17 MED ORDER — MIDAZOLAM HCL 2 MG/2ML IJ SOLN
INTRAMUSCULAR | Status: AC
  Filled 2022-10-17: qty 2

## 2022-10-17 MED ORDER — METHYLPREDNISOLONE SODIUM SUCC 125 MG IJ SOLR: 125.0000 mg | Freq: Once | INTRAMUSCULAR | Status: DC | PRN

## 2022-10-17 SURGICAL SUPPLY — 8 items
ADH SKN CLS APL DERMABOND .7 (GAUZE/BANDAGES/DRESSINGS) ×1
DERMABOND ADVANCED .7 DNX12 (GAUZE/BANDAGES/DRESSINGS) IMPLANT
KIT PORT POWER 8FR ISP CVUE (Port) IMPLANT
PACK ANGIOGRAPHY (CUSTOM PROCEDURE TRAY) ×1 IMPLANT
PENCIL ELECTRO HAND CTR (MISCELLANEOUS) IMPLANT
SUT MNCRL AB 4-0 PS2 18 (SUTURE) IMPLANT
SUT VIC AB 3-0 SH 27 (SUTURE) ×1
SUT VIC AB 3-0 SH 27X BRD (SUTURE) IMPLANT

## 2022-10-17 NOTE — Op Note (Signed)
      [facility]       Operative Note  Date: 10/17/2022  Preoperative diagnosis:  1. Esophageal cancer  Postoperative diagnosis:  Same as above  Procedures: #1. Ultrasound guidance for vascular access to the right internal jugular vein. #2. Fluoroscopic guidance for placement of catheter. #3. Placement of CT compatible Port-A-Cath, right internal jugular vein.  Surgeon: Leotis Pain, MD.   Anesthesia: Local with moderate conscious sedation for approximately 27  minutes using 2 mg of Versed and 50 mcg of Fentanyl  Fluoroscopy time: less than 1 minute  Contrast used: 0  Estimated blood loss: 5 cc  Indication for the procedure:  The patient is a 67 y.o.male with esophageal cancer.  The patient needs a Port-A-Cath for durable venous access, chemotherapy, lab draws, and CT scans. We are asked to place this. Risks and benefits were discussed and informed consent was obtained.  Description of procedure: The patient was brought to the vascular and interventional radiology suite.  Moderate conscious sedation was administered throughout the procedure during a face to face encounter with the patient with my supervision of the RN administering medicines and monitoring the patient's vital signs, pulse oximetry, telemetry and mental status throughout from the start of the procedure until the patient was taken to the recovery room. The right neck chest and shoulder were sterilely prepped and draped, and a sterile surgical field was created. Ultrasound was used to help visualize a patent right internal jugular vein. This was then accessed under direct ultrasound guidance without difficulty with the Seldinger needle and a permanent image was recorded. A J-wire was placed. After skin nick and dilatation, the peel-away sheath was then placed over the wire. I then anesthetized an area under the clavicle approximately 1-2 fingerbreadths. A transverse incision was created and an inferior  pocket was created with electrocautery and blunt dissection. The port was then brought onto the field, placed into the pocket and secured to the chest wall with 2 Prolene sutures. The catheter was connected to the port and tunneled from the subclavicular incision to the access site. Fluoroscopic guidance was then used to cut the catheter to an appropriate length. The catheter was then placed through the peel-away sheath and the peel-away sheath was removed. The catheter tip was parked in excellent location under fluorocoscopic guidance in the cavoatrial junction. The pocket was then irrigated with antibiotic impregnated saline and the wound was closed with a running 3-0 Vicryl and a 4-0 Monocryl. The access incision was closed with a single 4-0 Monocryl. The Huber needle was used to withdraw blood and flush the port with heparinized saline. Dermabond was then placed as a dressing. The patient tolerated the procedure well and was taken to the recovery room in stable condition.   Leotis Pain 10/17/2022 9:14 AM   This note was created with Dragon Medical transcription system. Any errors in dictation are purely unintentional.

## 2022-10-17 NOTE — Discharge Instructions (Signed)
Implanted Port Home Guide  An implanted port is a type of central line that is placed under the skin. Central lines are used to provide IV access when treatment or nutrition needs to be given through a person's veins. Implanted ports are used for long-term IV access. An implanted port may be placed because: You need IV medicine that would be irritating to the small veins in your hands or arms. You need long-term IV medicines, such as antibiotics. You need IV nutrition for a long period. You need frequent blood draws for lab tests. You need dialysis.   Implanted ports are usually placed in the chest area, but they can also be placed in the upper arm, the abdomen, or the leg. An implanted port has two main parts: Reservoir. The reservoir is round and will appear as a small, raised area under your skin. The reservoir is the part where a needle is inserted to give medicines or draw blood. Catheter. The catheter is a thin, flexible tube that extends from the reservoir. The catheter is placed into a large vein. Medicine that is inserted into the reservoir goes into the catheter and then into the vein.   How will I care for my incision  You may shower tomorrow  How is my port accessed? Special steps must be taken to access the port: Before the port is accessed, a numbing cream can be placed on the skin. This helps numb the skin over the port site. Your health care provider uses a sterile technique to access the port. Your health care provider must put on a mask and sterile gloves. The skin over your port is cleaned carefully with an antiseptic and allowed to dry. The port is gently pinched between sterile gloves, and a needle is inserted into the port. Only "non-coring" port needles should be used to access the port. Once the port is accessed, a blood return should be checked. This helps ensure that the port is in the vein and is not clogged. If your port needs to remain accessed for a constant  infusion, a clear (transparent) bandage will be placed over the needle site. The bandage and needle will need to be changed every week, or as directed by your health care provider.   What is flushing? Flushing helps keep the port from getting clogged. Follow your health care provider's instructions on how and when to flush the port. Ports are usually flushed with saline solution or a medicine called heparin. The need for flushing will depend on how the port is used. If the port is used for intermittent medicines or blood draws, the port will need to be flushed: After medicines have been given. After blood has been drawn. As part of routine maintenance. If a constant infusion is running, the port may not need to be flushed.   How long will my port stay implanted? The port can stay in for as long as your health care provider thinks it is needed. When it is time for the port to come out, surgery will be done to remove it. The procedure is similar to the one performed when the port was put in. When should I seek immediate medical care? When you have an implanted port, you should seek immediate medical care if: You notice a bad smell coming from the incision site. You have swelling, redness, or drainage at the incision site. You have more swelling or pain at the port site or the surrounding area. You have a fever that   is not controlled with medicine.   This information is not intended to replace advice given to you by your health care provider. Make sure you discuss any questions you have with your health care provider. Document Released: 07/15/2005 Document Revised: 12/21/2015 Document Reviewed: 03/22/2013 Elsevier Interactive Patient Education  2017 [employer]    

## 2022-10-17 NOTE — Interval H&P Note (Signed)
History and Physical Interval Note:  10/17/2022 7:43 AM  Jonathan [name]  has presented today for surgery, with the diagnosis of Porta Cath Placement   Esophageal Ca.  The various methods of treatment have been discussed with the patient and family. After consideration of risks, benefits and other options for treatment, the patient has consented to  Procedure(s): PORTA CATH INSERTION (N/A) as a surgical intervention.  The patient's history has been reviewed, patient examined, no change in status, stable for surgery.  I have reviewed the patient's chart and labs.  Questions were answered to the patient's satisfaction.     Leotis Pain

## 2022-10-18 ENCOUNTER — Encounter: Payer: Self-pay | Admitting: Vascular Surgery

## 2022-10-18 ENCOUNTER — Ambulatory Visit: Payer: No Typology Code available for payment source

## 2022-10-18 MED FILL — Dexamethasone Sodium Phosphate Inj 100 MG/10ML: INTRAMUSCULAR | Qty: 1 | Status: AC

## 2022-10-21 ENCOUNTER — Inpatient Hospital Stay: Payer: No Typology Code available for payment source | Attending: Oncology

## 2022-10-21 ENCOUNTER — Other Ambulatory Visit: Payer: Self-pay

## 2022-10-21 ENCOUNTER — Inpatient Hospital Stay: Payer: No Typology Code available for payment source

## 2022-10-21 ENCOUNTER — Inpatient Hospital Stay (HOSPITAL_BASED_OUTPATIENT_CLINIC_OR_DEPARTMENT_OTHER): Payer: No Typology Code available for payment source | Admitting: Oncology

## 2022-10-21 ENCOUNTER — Encounter: Payer: Self-pay | Admitting: Oncology

## 2022-10-21 ENCOUNTER — Telehealth: Payer: Self-pay

## 2022-10-21 ENCOUNTER — Ambulatory Visit
Admission: RE | Admit: 2022-10-21 | Discharge: 2022-10-21 | Disposition: A | Discharge status: Home / Self Care | Payer: No Typology Code available for payment source | Source: Ambulatory Visit | Attending: Radiation Oncology | Admitting: Radiation Oncology

## 2022-10-21 VITALS — BP 130/78 | HR 76 | Temp 97.9°F | Resp 18 | Ht 69.0 in | Wt 247.0 lb

## 2022-10-21 DIAGNOSIS — R1310 Dysphagia, unspecified: Secondary | ICD-10-CM | POA: Insufficient documentation

## 2022-10-21 DIAGNOSIS — I2510 Atherosclerotic heart disease of native coronary artery without angina pectoris: Secondary | ICD-10-CM | POA: Diagnosis not present

## 2022-10-21 DIAGNOSIS — Z510 Encounter for antineoplastic radiation therapy: Secondary | ICD-10-CM | POA: Diagnosis not present

## 2022-10-21 DIAGNOSIS — C159 Malignant neoplasm of esophagus, unspecified: Secondary | ICD-10-CM | POA: Diagnosis not present

## 2022-10-21 DIAGNOSIS — C7800 Secondary malignant neoplasm of unspecified lung: Secondary | ICD-10-CM | POA: Diagnosis not present

## 2022-10-21 DIAGNOSIS — Z79890 Hormone replacement therapy (postmenopausal): Secondary | ICD-10-CM | POA: Diagnosis not present

## 2022-10-21 DIAGNOSIS — Z5111 Encounter for antineoplastic chemotherapy: Secondary | ICD-10-CM

## 2022-10-21 DIAGNOSIS — Z803 Family history of malignant neoplasm of breast: Secondary | ICD-10-CM | POA: Insufficient documentation

## 2022-10-21 DIAGNOSIS — K2970 Gastritis, unspecified, without bleeding: Secondary | ICD-10-CM | POA: Insufficient documentation

## 2022-10-21 DIAGNOSIS — R634 Abnormal weight loss: Secondary | ICD-10-CM

## 2022-10-21 DIAGNOSIS — E876 Hypokalemia: Secondary | ICD-10-CM | POA: Insufficient documentation

## 2022-10-21 DIAGNOSIS — K8020 Calculus of gallbladder without cholecystitis without obstruction: Secondary | ICD-10-CM | POA: Diagnosis not present

## 2022-10-21 DIAGNOSIS — C155 Malignant neoplasm of lower third of esophagus: Secondary | ICD-10-CM | POA: Insufficient documentation

## 2022-10-21 DIAGNOSIS — R1319 Other dysphagia: Secondary | ICD-10-CM | POA: Diagnosis not present

## 2022-10-21 DIAGNOSIS — N200 Calculus of kidney: Secondary | ICD-10-CM | POA: Insufficient documentation

## 2022-10-21 DIAGNOSIS — Z79899 Other long term (current) drug therapy: Secondary | ICD-10-CM | POA: Insufficient documentation

## 2022-10-21 DIAGNOSIS — K760 Fatty (change of) liver, not elsewhere classified: Secondary | ICD-10-CM | POA: Insufficient documentation

## 2022-10-21 LAB — CBC WITH DIFFERENTIAL (CANCER CENTER ONLY)
Abs Immature Granulocytes: 0.01 10*3/uL (ref 0.00–0.07)
Basophils Absolute: 0.1 10*3/uL (ref 0.0–0.1)
Basophils Relative: 1 %
Eosinophils Absolute: 0.3 10*3/uL (ref 0.0–0.5)
Eosinophils Relative: 4 %
HCT: 39.9 % (ref 39.0–52.0)
Hemoglobin: 12.7 g/dL — ABNORMAL LOW (ref 13.0–17.0)
Immature Granulocytes: 0 %
Lymphocytes Relative: 22 %
Lymphs Abs: 1.5 10*3/uL (ref 0.7–4.0)
MCH: 26.6 pg (ref 26.0–34.0)
MCHC: 31.8 g/dL (ref 30.0–36.0)
MCV: 83.6 fL (ref 80.0–100.0)
Monocytes Absolute: 0.5 10*3/uL (ref 0.1–1.0)
Monocytes Relative: 8 %
Neutro Abs: 4.3 10*3/uL (ref 1.7–7.7)
Neutrophils Relative %: 65 %
Platelet Count: 308 10*3/uL (ref 150–400)
RBC: 4.77 MIL/uL (ref 4.22–5.81)
RDW: 13.3 % (ref 11.5–15.5)
WBC Count: 6.6 10*3/uL (ref 4.0–10.5)
nRBC: 0 % (ref 0.0–0.2)

## 2022-10-21 LAB — CMP (CANCER CENTER ONLY)
ALT: 38 U/L (ref 0–44)
AST: 35 U/L (ref 15–41)
Albumin: 3.7 g/dL (ref 3.5–5.0)
Alkaline Phosphatase: 53 U/L (ref 38–126)
Anion gap: 8 (ref 5–15)
BUN: 10 mg/dL (ref 8–23)
CO2: 27 mmol/L (ref 22–32)
Calcium: 9.2 mg/dL (ref 8.9–10.3)
Chloride: 104 mmol/L (ref 98–111)
Creatinine: 1.04 mg/dL (ref 0.61–1.24)
GFR, Estimated: >60 mL/min (ref >60)
Glucose, Bld: 141 mg/dL — ABNORMAL HIGH (ref 70–99)
Potassium: 3 mmol/L — ABNORMAL LOW (ref 3.5–5.1)
Sodium: 139 mmol/L (ref 135–145)
Total Bilirubin: 0.6 mg/dL (ref 0.3–1.2)
Total Protein: 7.6 g/dL (ref 6.5–8.1)

## 2022-10-21 LAB — RAD ONC ARIA SESSION SUMMARY
Course Elapsed Days: 0
Plan Fractions Treated to Date: 1
Plan Prescribed Dose Per Fraction: 1.8 Gy
Plan Total Fractions Prescribed: 30
Plan Total Prescribed Dose: 54 Gy
Reference Point Dosage Given to Date: 1.8 Gy
Reference Point Session Dosage Given: 1.8 Gy
Session Number: 1

## 2022-10-21 MED ORDER — DEXTROSE 5 % IV SOLN
Freq: Once | INTRAVENOUS | Status: AC
  Filled 2022-10-21: qty 250

## 2022-10-21 MED ORDER — PALONOSETRON HCL INJECTION 0.25 MG/5ML
0.2500 mg | Freq: Once | INTRAVENOUS | Status: AC
  Administered 2022-10-21: 0.25 mg via INTRAVENOUS
  Filled 2022-10-21: qty 5

## 2022-10-21 MED ORDER — SODIUM CHLORIDE 0.9 % IV SOLN
2400.0000 mg/m2 | INTRAVENOUS | Status: DC
  Administered 2022-10-21: 5600 mg via INTRAVENOUS
  Filled 2022-10-21: qty 112

## 2022-10-21 MED ORDER — OXALIPLATIN CHEMO INJECTION 100 MG/20ML
75.0000 mg/m2 | Freq: Once | INTRAVENOUS | Status: AC
  Administered 2022-10-21: 175 mg via INTRAVENOUS
  Filled 2022-10-21: qty 35

## 2022-10-21 MED ORDER — SODIUM CHLORIDE 0.9% FLUSH
10.0000 mL | INTRAVENOUS | Status: DC | PRN
  Filled 2022-10-21: qty 10

## 2022-10-21 MED ORDER — HEPARIN SOD (PORK) LOCK FLUSH 100 UNIT/ML IV SOLN
500.0000 [IU] | Freq: Once | INTRAVENOUS | Status: DC | PRN
  Filled 2022-10-21: qty 5

## 2022-10-21 MED ORDER — LEUCOVORIN CALCIUM INJECTION 350 MG
400.0000 mg/m2 | Freq: Once | INTRAVENOUS | Status: AC
  Administered 2022-10-21: 936 mg via INTRAVENOUS
  Filled 2022-10-21: qty 46.8

## 2022-10-21 MED ORDER — FLUOROURACIL CHEMO INJECTION 2.5 GM/50ML
400.0000 mg/m2 | Freq: Once | INTRAVENOUS | Status: AC
  Administered 2022-10-21: 1000 mg via INTRAVENOUS
  Filled 2022-10-21: qty 20

## 2022-10-21 MED ORDER — POTASSIUM CHLORIDE CRYS ER 20 MEQ PO TBCR: 20.0000 meq | EXTENDED_RELEASE_TABLET | Freq: Every day | ORAL | 0 refills | Status: DC

## 2022-10-21 MED ORDER — SODIUM CHLORIDE 0.9 % IV SOLN
10.0000 mg | Freq: Once | INTRAVENOUS | Status: AC
  Administered 2022-10-21: 10 mg via INTRAVENOUS
  Filled 2022-10-21: qty 10

## 2022-10-21 NOTE — Assessment & Plan Note (Signed)
Chemotherapy plan as listed above 

## 2022-10-21 NOTE — Progress Notes (Signed)
Certain foods seem harder to digest, causes some nausea. More frequent bowel movements. Appetite is "okay". still has a lingering cough from recent illness has pcp follow up coming up.

## 2022-10-21 NOTE — Progress Notes (Signed)
Hematology/Oncology Consult Note [phone] [phone]     REFERRING PROVIDER: Earlie Server, MD  CHIEF COMPLAINTS/PURPOSE OF CONSULTATION:  Esophageal adenocarcinoma  ASSESSMENT & PLAN:   Cancer Staging  Adenocarcinoma of esophagus [facility]) Staging form: Esophagus - Adenocarcinoma, AJCC 8th Edition - Clinical stage from 08/28/2022: Stage IVB (cT3, cN1, cM1) - Signed by Earlie Server, MD on 10/04/2022   Adenocarcinoma of esophagus [facility]) EGD findings, pathology reports, CT, MRI, PET scan results were reviewed with patient Stage IV esophageal adenocarcinoma.  I recommend systematic palliative chemotherapy with FOLFOX [Oxaliplatin 75mg /m2 given that he will be on palliative RT and may have increased toxicity]  Rationale and potential side effects were reviewed with patient. And he agrees with the plan.  Labs are reviewed and discussed with patient. Proceed with cycle 1 FOLFOX   Check [employer] NGS  Hypokalemia continue potassium supplementation 27meq daily.   Encounter for antineoplastic chemotherapy Chemotherapy plan as listed above.   Weight loss Refer to nutritionist   Dysphagia Continue follow up with Rad Onc. Patient gets palliative radiation.    Orders Placed This Encounter  Procedures   CBC with Differential ([place] Only)    Standing Status:   Future    Standing Expiration Date:   11/04/2023   CMP ([place] only)    Standing Status:   Future    Standing Expiration Date:   11/04/2023   CBC with Differential ([place] Only)    Standing Status:   Future    Standing Expiration Date:   11/18/2023   CMP ([place] only)    Standing Status:   Future    Standing Expiration Date:   11/18/2023   CBC with Differential ([facility] Center Only)    Standing Status:   Future    Standing Expiration Date:   10/21/2023   CMP ([place] only)    Standing Status:   Future    Standing Expiration Date:   10/21/2023   Ambulatory Referral to [facility] Nutrition     Referral Priority:   Routine    Referral Type:   Consultation    Referral Reason:   Specialty Services Required    Number of Visits Requested:   1   Follow-up  1 week lab NP +/- IVF - cbc cmp  2 weeks lab MD FOLFOX D3 pump dc  All questions were answered. The patient knows to call the clinic with any problems, questions or concerns.  Earlie Server, MD, PhD [facility] Hematology Oncology 10/21/2022   HISTORY OF PRESENTING ILLNESS:  Jonathan [name] 67 y.o. male presents to establish care for esophageal adenocarcinoma I have reviewed his chart and materials related to his cancer extensively and collaborated history with the patient. Summary of oncologic history is as follows: Oncology History  Adenocarcinoma of esophagus ([place])  08/28/2022 Initial Diagnosis   Adenocarcinoma of esophagus   -Patient has noticed worsening of "food stuck/fullness" sensation since November 2023.  Patient had a barium swallow study which commented on marked mucosal irregularity in the distal esophagus with Broaddus base mural filling defect highly suspicious for malignancy.  Patient establish care with gastroenterology. -08/23/2022, EGD showed gastritis and partially obstructing malignant esophageal tumor in the lower third of the esophagus. Esophagus mass biopsy showed adenocarcinoma. Stomach biopsy showed gastric mucosa with no specific histology abnormality.  No significant intestinal metaplastic, dysplastic, granular atrophy or increased inflammation.     08/28/2022 Imaging   CT chest abdomen pelvis with contrast showed 1. Distal esophageal primary with gastrohepatic ligament  nodal metastasis. 2. 2 right-sided pulmonary nodules, the largest of which measures 5 mm and is new since 2015. Pulmonary metastasis not be excluded. 3. Anterior right lower lobe volume loss and minimal soft tissue density, favoring atelectasis or scar. Recommend attention on follow-up. 4. Hepatic steatosis 5. Cholelithiasis 6. Left  nephrolithiasis 7. Coronary artery atherosclerosis. Aortic Atherosclerosis   08/28/2022 Cancer Staging   Staging form: Esophagus - Adenocarcinoma, AJCC 8th Edition - Clinical stage from 08/28/2022: Stage IVB (cT3, cN1, cM1) - Signed by Earlie Server, MD on 10/04/2022 Stage prefix: Initial diagnosis   09/06/2022 Imaging   PET scan showed 1. Esophageal primary with gastrohepatic ligament nodal metastasis,as on CT. 2. Focus of hypermetabolism which is favored to registered to the posterior hepatic dome, in the region of subtle heterogeneity on prior diagnostic CT. Suboptimally evaluated secondary to underlying steatosis. Recommend further evaluation with pre and post contrast abdominal MRI to confirm probable metastasis. 3. Incidental findings, including: Left nephrolithiasis.Cholelithiasis. Coronary artery atherosclerosis. Aortic Atherosclerosis    09/14/2022 - 09/14/2022 Chemotherapy   Patient is on Treatment Plan : ESOPHAGUS Carboplatin + Paclitaxel Weekly X 6 Weeks with XRT     09/23/2022 Imaging   MRI abdomen with and without contrast showed 1. Mildly T2 hyperintense segment VII hepatic lesion measuring 2.7 cm with imaging characteristics compatible with metastatic disease. 2. Tiny focus of delayed enhancement in the inferior right lobe of the liver segment VI measuring 8 mm with ill-defined increased T2 signal and subtle corresponding reduced diffusivity, also suspicious for metastatic disease. 3. Partially visualized distal esophageal wall thickening compatible with the patient's known primary neoplasm. 4. Similar size of the 11 mm gastrohepatic ligament lymph node mildly metabolic on prior PET-CT and compatible with local nodal disease involvement. 5. 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 Recommend follow up pre and post-contrast MRI/MRCP in 1 year. 6. Diffuse hepatic steatosis.   10/10/2022 Procedure    LIVER MASS; CT-GUIDED BIOPSY:  - MODERATE TO POORLY DIFFERENTIATED ADENOCARCINOMA MORPHOLOGICALLY  CONSISTENT WITH METASTASIS FROM PATIENT'S KNOWN ESOPHAGEAL  ADENOCARCINOMA.    10/17/2022 Procedure   Medi port placed by Dr. Lucky Cowboy   10/21/2022 -  Chemotherapy   Patient is on Treatment Plan : ESOPHAGEAL ADENOCARCINOMA FOLFOX q14d x 6 cycles     Patient presents to establish care.  He is not taking PPI. He has intentionally lost some weight. Family history positive for father and paternal uncle with prostate cancer and sister with breast cancer. Denies any routine alcohol use  Never smoker.   INTERVAL HISTORY Jonathan [name] is a 67 y.o. male who has above history reviewed by me today presents for follow up visit for Stage IV esophageal adenocarcinoma.  He has no new complaints. Medi port was placed.    MEDICAL HISTORY:  Past Medical History:  Diagnosis Date   Arthritis    Complication of anesthesia    OCCURRED ONCE YEARS AGO 1981   Esophageal mass    Hypertension    Hypothyroidism    PONV (postoperative nausea and vomiting)     SURGICAL HISTORY: Past Surgical History:  Procedure Laterality Date   COLONOSCOPY     ESOPHAGOGASTRODUODENOSCOPY N/A 08/23/2022   Procedure: ESOPHAGOGASTRODUODENOSCOPY (EGD);  Surgeon: Annamaria Helling, DO;  Location: [facility] ENDOSCOPY;  Service: Gastroenterology;  Laterality: N/A;   EUS N/A 09/12/2022   Procedure: FULL UPPER ENDOSCOPIC ULTRASOUND (EUS) RADIAL;  Surgeon: Burbridge, Murray Hodgkins, MD;  Location: [place] ENDOSCOPY;  Service: Gastroenterology;  Laterality: N/A;   FINGER SURGERY     PORTA CATH INSERTION N/A 10/17/2022   Procedure: PORTA CATH INSERTION;  Surgeon: Algernon Huxley, MD;  Location: [place] CV LAB;  Service: Cardiovascular;  Laterality: N/A;   TENDON REPAIR IN LEFT KNEE      SOCIAL HISTORY: Social History   Socioeconomic History   Marital status: Single    Spouse name: Not on file   Number of children: Not on file   Years  of education: Not on file   Highest education level: Not on file  Occupational History   Not on file  Tobacco Use   Smoking status: Never   Smokeless tobacco: Never  Vaping Use   Vaping Use: Never used  Substance and Sexual Activity   Alcohol use: Yes    Comment: OCCASIONALLY   Drug use: Never   Sexual activity: Not on file  Other Topics Concern   Not on file  Social History Narrative   Not on file   Social Determinants of Health   Financial Resource Strain: Not on file  Food Insecurity: No Food Insecurity (08/28/2022)   Hunger Vital Sign    Worried About Running Out of Food in the Last Year: Never true    Ran Out of Food in the Last Year: Never true  Transportation Needs: No Transportation Needs (08/28/2022)   PRAPARE - Hydrologist (Medical): No    Lack of Transportation (Non-Medical): No  Physical Activity: Not on file  Stress: Not on file  Social Connections: Not on file  Intimate Partner Violence: Not At Risk (08/28/2022)   Humiliation, Afraid, Rape, and Kick questionnaire    Fear of Current or Ex-Partner: No    Emotionally Abused: No    Physically Abused: No    Sexually Abused: No    FAMILY HISTORY: Family History  Problem Relation Age of Onset   Heart attack Mother    Prostate cancer Father    Breast cancer Sister    Prostate cancer Paternal Uncle     ALLERGIES:  has No Known Allergies.  MEDICATIONS:  Current Outpatient Medications  Medication Sig Dispense Refill   acetaminophen (TYLENOL) 650 MG CR tablet Take 1,300 mg by mouth every 8 (eight) hours as needed for pain.     amLODipine (NORVASC) 5 MG tablet Take 5 mg by mouth daily.     atenolol (TENORMIN) 50 MG tablet Take 50 mg by mouth daily.     Cholecalciferol (VITAMIN D3) 125 MCG (5000 UT) CAPS Take 5,000 Units by mouth daily.     hydrochlorothiazide (HYDRODIURIL) 25 MG tablet Take 25 mg by mouth daily.     levothyroxine (SYNTHROID) 100 MCG tablet Take 100 mcg by mouth  daily before breakfast.     lidocaine-prilocaine (EMLA) cream Apply to affected area once 30 g 6   Multiple Vitamins-Minerals (MULTIVITAMIN WITH MINERALS) tablet Take 1 tablet by mouth daily.     omeprazole (PRILOSEC) 20 MG capsule Take 20 mg by mouth daily.     ondansetron (ZOFRAN) 8 MG tablet Take 1 tablet (8 mg total) by mouth every 8 (eight) hours as needed for nausea or vomiting. Start on the third day after chemotherapy. 30 tablet 1   prochlorperazine (COMPAZINE) 10 MG tablet Take 1 tablet (10 mg total) by mouth every 6 (six) hours as needed for nausea or vomiting. 30 tablet 1   zinc gluconate 50 MG tablet Take 50 mg by mouth daily.  potassium chloride SA (KLOR-CON M) 20 MEQ tablet Take 1 tablet (20 mEq total) by mouth daily. 30 tablet 0   No current facility-administered medications for this visit.   Facility-Administered Medications Ordered in Other Visits  Medication Dose Route Frequency Provider Last Rate Last Admin   fluorouracil (ADRUCIL) 5,600 mg in sodium chloride 0.9 % 138 mL chemo infusion  2,400 mg/m2 (Order-Specific) Intravenous 1 day or 1 dose Earlie Server, MD       fluorouracil (ADRUCIL) chemo injection 1,000 mg  400 mg/m2 (Order-Specific) Intravenous Once Earlie Server, MD       heparin lock flush 100 unit/mL  500 Units Intracatheter Once PRN Earlie Server, MD       leucovorin 936 mg in dextrose 5 % 250 mL infusion  400 mg/m2 (Order-Specific) Intravenous Once Earlie Server, MD 148 mL/hr at 10/21/22 1122 936 mg at 10/21/22 1122   oxaliplatin (ELOXATIN) 175 mg in dextrose 5 % 500 mL chemo infusion  75 mg/m2 (Order-Specific) Intravenous Once Earlie Server, MD 268 mL/hr at 10/21/22 1120 175 mg at 10/21/22 1120   sodium chloride flush (NS) 0.9 % injection 10 mL  10 mL Intracatheter PRN Earlie Server, MD        Review of Systems  Constitutional:  Positive for unexpected weight change. Negative for appetite change, chills, fatigue and fever.  HENT:   Negative for hearing loss and voice change.   Eyes:   Negative for eye problems and icterus.  Respiratory:  Negative for chest tightness, cough and shortness of breath.   Cardiovascular:  Negative for chest pain and leg swelling.  Gastrointestinal:  Negative for abdominal distention and abdominal pain.       Dysphagia/epigastric fullness  Endocrine: Negative for hot flashes.  Genitourinary:  Negative for difficulty urinating, dysuria and frequency.   Musculoskeletal:  Negative for arthralgias.  Skin:  Negative for itching and rash.  Neurological:  Negative for light-headedness and numbness.  Hematological:  Negative for adenopathy. Does not bruise/bleed easily.  Psychiatric/Behavioral:  Negative for confusion.      PHYSICAL EXAMINATION: ECOG PERFORMANCE STATUS: 0 - Asymptomatic  Vitals:   10/21/22 0941  BP: 130/78  Pulse: 76  Resp: 18  Temp: 97.9 F (36.6 C)  SpO2: 98%   Filed Weights   10/21/22 0937  Weight: 247 lb (112 kg)    Physical Exam Constitutional:      General: He is not in acute distress.    Appearance: He is obese. He is not diaphoretic.  HENT:     Head: Normocephalic and atraumatic.     Nose: Nose normal.     Mouth/Throat:     Pharynx: No oropharyngeal exudate.  Eyes:     General: No scleral icterus.    Pupils: Pupils are equal, round, and reactive to light.  Cardiovascular:     Rate and Rhythm: Normal rate and regular rhythm.     Heart sounds: No murmur heard. Pulmonary:     Effort: Pulmonary effort is normal. No respiratory distress.     Breath sounds: No rales.  Chest:     Chest wall: No tenderness.  Abdominal:     General: There is no distension.     Palpations: Abdomen is soft.     Tenderness: There is no abdominal tenderness.  Musculoskeletal:        General: Normal range of motion.     Cervical back: Normal range of motion and neck supple.  Skin:    General: Skin is warm and dry.  Findings: No erythema.  Neurological:     Mental Status: He is alert and oriented to person, place, and  time.     Cranial Nerves: No cranial nerve deficit.     Motor: No abnormal muscle tone.     Coordination: Coordination normal.  Psychiatric:        Mood and Affect: Affect normal.      LABORATORY DATA:  I have reviewed the data as listed    Latest Ref Rng & Units 10/21/2022    8:41 AM 10/10/2022    7:47 AM 08/28/2022   10:45 AM  CBC  WBC 4.0 - 10.5 K/uL 6.6  6.9  9.4   Hemoglobin 13.0 - 17.0 g/dL 12.7  13.0  14.0   Hematocrit 39.0 - 52.0 % 39.9  40.1  42.6   Platelets 150 - 400 K/uL 308  306  369       Latest Ref Rng & Units 10/21/2022    8:41 AM 10/10/2022    7:47 AM 08/28/2022   10:45 AM  CMP  Glucose 70 - 99 mg/dL 141  118  100   BUN 8 - 23 mg/dL 10  11  10    Creatinine 0.61 - 1.24 mg/dL 1.04  0.96  1.12   Sodium 135 - 145 mmol/L 139  137  138   Potassium 3.5 - 5.1 mmol/L 3.0  3.1  2.9   Chloride 98 - 111 mmol/L 104  106  99   CO2 22 - 32 mmol/L 27  24  27    Calcium 8.9 - 10.3 mg/dL 9.2  8.9  9.3   Total Protein 6.5 - 8.1 g/dL 7.6  7.5  7.9   Total Bilirubin 0.3 - 1.2 mg/dL 0.6  0.8  0.5   Alkaline Phos 38 - 126 U/L 53  49  43   AST 15 - 41 U/L 35  40  34   ALT 0 - 44 U/L 38  49  46      RADIOGRAPHIC STUDIES: I have personally reviewed the radiological images as listed and agreed with the findings in the report. PERIPHERAL VASCULAR CATHETERIZATION  Result Date: 10/17/2022 See surgical note for result.  CT LIVER MASS BIOPSY  Result Date: 10/10/2022 INDICATION: History of esophageal cancer with hypermetabolic liver mass on PET. EXAM: CT-GUIDED LIVER MASS BIOPSY COMPARISON:  CT CAP, 08/26/2022. PET-CT, 09/05/2022. MR, 09/20/2022. MEDICATIONS: 10 mL lidocaine 1% ANESTHESIA/SEDATION: Moderate (conscious) sedation was employed during this procedure. A total of Versed 2 mg and Fentanyl 100 mcg was administered intravenously. Moderate Sedation Time: 10 minutes. The patient's level of consciousness and vital signs were monitored continuously by radiology nursing throughout the  procedure under my direct supervision. CONTRAST:  None. COMPLICATIONS: None immediate. PROCEDURE: RADIATION DOSE REDUCTION: This exam was performed according to the departmental dose-optimization program which includes automated exposure control, adjustment of the mA and/or kV according to patient size and/or use of iterative reconstruction technique. Informed consent was obtained from the the patient and/or patient's representative following an explanation of the procedure, risks, benefits and alternatives. A time out was performed prior to the initiation of the procedure. The patient was positioned supine on the CT table and a limited CT was performed for procedural planning demonstrating superior RIGHT hepatic lobe mass, difficult to perceive on procedural CT. The procedure was planned. The operative site was prepped and draped in the usual sterile fashion. Appropriate trajectory was confirmed with a 22 gauge spinal needle after the adjacent tissues were anesthetized with  1% Lidocaine with epinephrine. Under intermittent CT guidance, a 17 gauge coaxial needle was advanced into the peripheral aspect of the mass. Appropriate positioning was confirmed and 4 samples were obtained with an 18 gauge core needle biopsy device. Gelfoam slurry injection was performed for hemostasis. The co-axial needle was removed and hemostasis was achieved with manual compression. A limited postprocedural CT was negative for hemorrhage or additional complication. A dressing was placed. The patient tolerated the procedure well without immediate postprocedural complication. IMPRESSION: 1. Challenging biopsy with superiorly-located RIGHT hepatic lobe mass near the liver dome, and difficult to perceive on procedural CT. 2. Successful CT guided core needle biopsy of liver mass. Michaelle Birks, MD [facility] Radiology Electronically Signed   By: Michaelle Birks M.D.   On: 10/10/2022 10:03

## 2022-10-21 NOTE — Assessment & Plan Note (Signed)
continue potassium supplementation 1meq daily.

## 2022-10-21 NOTE — Assessment & Plan Note (Addendum)
EGD findings, pathology reports, CT, MRI, PET scan results were reviewed with patient Stage IV esophageal adenocarcinoma.  I recommend systematic palliative chemotherapy with FOLFOX [Oxaliplatin 75mg /m2 given that he will be on palliative RT and may have increased toxicity]  Rationale and potential side effects were reviewed with patient. And he agrees with the plan.  Labs are reviewed and discussed with patient. Proceed with cycle 1 FOLFOX   Check [employer] NGS

## 2022-10-21 NOTE — Patient Instructions (Addendum)
[facility]  Discharge Instructions: Thank you for choosing [facility] to provide your oncology and hematology care.  If you have a lab appointment with the [facility], please go directly to the [facility] and check in at the registration area.  Wear comfortable clothing and clothing appropriate for easy access to any Portacath or PICC line.   We strive to give you quality time with your provider. You may need to reschedule your appointment if you arrive late (15 or more minutes).  Arriving late affects you and other patients whose appointments are after yours.  Also, if you miss three or more appointments without notifying the office, you may be dismissed from the clinic at the provider's discretion.      For prescription refill requests, have your pharmacy contact our office and allow 72 hours for refills to be completed.    Today you received the following chemotherapy and/or immunotherapy agents oxaliplatin/leucovorin/fluorouracil       To help prevent nausea and vomiting after your treatment, we encourage you to take your nausea medication as directed.  BELOW ARE SYMPTOMS THAT SHOULD BE REPORTED IMMEDIATELY: *FEVER GREATER THAN 100.4 F (38 C) OR HIGHER *CHILLS OR SWEATING *NAUSEA AND VOMITING THAT IS NOT CONTROLLED WITH YOUR NAUSEA MEDICATION *UNUSUAL SHORTNESS OF BREATH *UNUSUAL BRUISING OR BLEEDING *URINARY PROBLEMS (pain or burning when urinating, or frequent urination) *BOWEL PROBLEMS (unusual diarrhea, constipation, pain near the anus) TENDERNESS IN MOUTH AND THROAT WITH OR WITHOUT PRESENCE OF ULCERS (sore throat, sores in mouth, or a toothache) UNUSUAL RASH, SWELLING OR PAIN  UNUSUAL VAGINAL DISCHARGE OR ITCHING   Items with * indicate a potential emergency and should be followed up as soon as possible or go to the Emergency Department if any problems should occur.  Please show the CHEMOTHERAPY ALERT CARD or IMMUNOTHERAPY  ALERT CARD at check-in to the Emergency Department and triage nurse.  Should you have questions after your visit or need to cancel or reschedule your appointment, please contact [facility]  [phone] and follow the prompts.  Office hours are 8:00 a.m. to 4:30 p.m. Monday - Friday. Please note that voicemails left after 4:00 p.m. may not be returned until the following business day.  We are closed weekends and major holidays. You have access to a nurse at all times for urgent questions. Please call the main number to the clinic [phone] and follow the prompts.  For any non-urgent questions, you may also contact your provider using MyChart. We now offer e-Visits for anyone 42 and older to request care online for non-urgent symptoms. For details visit [email or web address].   Also download the MyChart app! Go to the app store, search "MyChart", open the app, select [facility], and log in with your MyChart username and password. Oxaliplatin Injection What is this medication? OXALIPLATIN (ox AL i PLA tin) treats colorectal cancer. It works by slowing down the growth of cancer cells. This medicine may be used for other purposes; ask your health care provider or pharmacist if you have questions. COMMON BRAND NAME(S): Eloxatin What should I tell my care team before I take this medication? They need to know if you have any of these conditions: Heart disease History of irregular heartbeat or rhythm Liver disease Low blood cell levels (white cells, red cells, and platelets) Lung or breathing disease, such as asthma Take medications that treat or prevent blood clots Tingling of the fingers, toes, or other nerve  disorder An unusual or allergic reaction to oxaliplatin, other medications, foods, dyes, or preservatives If you or your partner are pregnant or trying to get pregnant Breast-feeding How should I use this medication? This medication is injected into a  vein. It is given by your care team in a hospital or clinic setting. Talk to your care team about the use of this medication in children. Special care may be needed. Overdosage: If you think you have taken too much of this medicine contact a poison control center or emergency room at once. NOTE: This medicine is only for you. Do not share this medicine with others. What if I miss a dose? Keep appointments for follow-up doses. It is important not to miss a dose. Call your care team if you are unable to keep an appointment. What may interact with this medication? Do not take this medication with any of the following: Cisapride Dronedarone Pimozide Thioridazine This medication may also interact with the following: Aspirin and aspirin-like medications Certain medications that treat or prevent blood clots, such as warfarin, apixaban, dabigatran, and rivaroxaban Cisplatin Cyclosporine Diuretics Medications for infection, such as acyclovir, adefovir, amphotericin B, bacitracin, cidofovir, foscarnet, ganciclovir, gentamicin, pentamidine, vancomycin NSAIDs, medications for pain and inflammation, such as ibuprofen or naproxen Other medications that cause heart rhythm changes Pamidronate Zoledronic acid This list may not describe all possible interactions. Give your health care provider a list of all the medicines, herbs, non-prescription drugs, or dietary supplements you use. Also tell them if you smoke, drink alcohol, or use illegal drugs. Some items may interact with your medicine. What should I watch for while using this medication? Your condition will be monitored carefully while you are receiving this medication. You may need blood work while taking this medication. This medication may make you feel generally unwell. This is not uncommon as chemotherapy can affect healthy cells as well as cancer cells. Report any side effects. Continue your course of treatment even though you feel ill unless  your care team tells you to stop. This medication may increase your risk of getting an infection. Call your care team for advice if you get a fever, chills, sore throat, or other symptoms of a cold or flu. Do not treat yourself. Try to avoid being around people who are sick. Avoid taking medications that contain aspirin, acetaminophen, ibuprofen, naproxen, or ketoprofen unless instructed by your care team. These medications may hide a fever. Be careful brushing or flossing your teeth or using a toothpick because you may get an infection or bleed more easily. If you have any dental work done, tell your dentist you are receiving this medication. This medication can make you more sensitive to cold. Do not drink cold drinks or use ice. Cover exposed skin before coming in contact with cold temperatures or cold objects. When out in cold weather wear warm clothing and cover your mouth and nose to warm the air that goes into your lungs. Tell your care team if you get sensitive to the cold. Talk to your care team if you or your partner are pregnant or think either of you might be pregnant. This medication can cause serious birth defects if taken during pregnancy and for 9 months after the last dose. A negative pregnancy test is required before starting this medication. A reliable form of contraception is recommended while taking this medication and for 9 months after the last dose. Talk to your care team about effective forms of contraception. Do not father a child while  taking this medication and for 6 months after the last dose. Use a condom while having sex during this time period. Do not breastfeed while taking this medication and for 3 months after the last dose. This medication may cause infertility. Talk to your care team if you are concerned about your fertility. What side effects may I notice from receiving this medication? Side effects that you should report to your care team as soon as possible: Allergic  reactions--skin rash, itching, hives, swelling of the face, lips, tongue, or throat Bleeding--bloody or black, tar-like stools, vomiting blood or brown material that looks like coffee grounds, red or dark brown urine, small red or purple spots on skin, unusual bruising or bleeding Dry cough, shortness of breath or trouble breathing Heart rhythm changes--fast or irregular heartbeat, dizziness, feeling faint or lightheaded, chest pain, trouble breathing Infection--fever, chills, cough, sore throat, wounds that don't heal, pain or trouble when passing urine, general feeling of discomfort or being unwell Liver injury--right upper belly pain, loss of appetite, nausea, light-colored stool, dark yellow or brown urine, yellowing skin or eyes, unusual weakness or fatigue Low red blood cell level--unusual weakness or fatigue, dizziness, headache, trouble breathing Muscle injury--unusual weakness or fatigue, muscle pain, dark yellow or brown urine, decrease in amount of urine Pain, tingling, or numbness in the hands or feet Sudden and severe headache, confusion, change in vision, seizures, which may be signs of posterior reversible encephalopathy syndrome (PRES) Unusual bruising or bleeding Side effects that usually do not require medical attention (report to your care team if they continue or are bothersome): Diarrhea Nausea Pain, redness, or swelling with sores inside the mouth or throat Unusual weakness or fatigue Vomiting This list may not describe all possible side effects. Call your doctor for medical advice about side effects. You may report side effects to FDA at 1-800-FDA-1088. Where should I keep my medication? This medication is given in a hospital or clinic. It will not be stored at home. NOTE: This sheet is a summary. It may not cover all possible information. If you have questions about this medicine, talk to your doctor, pharmacist, or health care provider.  2023 [employer]/Gold Standard  (2007-09-05 00:00:00)

## 2022-10-21 NOTE — Assessment & Plan Note (Signed)
Continue follow up with Rad Onc. Patient gets palliative radiation.

## 2022-10-21 NOTE — Assessment & Plan Note (Signed)
Refer to nutritionist 

## 2022-10-21 NOTE — Telephone Encounter (Signed)
Called and informed pt that Dr. [name] sent in 77mEq for him to take daily.   Pt states that he already has potassium at home (prescirbed by Dr. Kary Kos ) , but he did not take yesterday and today. Advised him to take 20 mEq daily. he verbalized understanding and states he will take today. He reports that he has 2 refills from Dr. Barbarann Ehlers rx.   called [facility] to cancel Dr. Collie Siad Potassium rx.

## 2022-10-21 NOTE — Patient Instructions (Signed)

## 2022-10-22 ENCOUNTER — Telehealth: Payer: Self-pay

## 2022-10-22 ENCOUNTER — Other Ambulatory Visit: Payer: Self-pay

## 2022-10-22 ENCOUNTER — Ambulatory Visit
Admission: RE | Admit: 2022-10-22 | Discharge: 2022-10-22 | Disposition: A | Discharge status: Home / Self Care | Payer: No Typology Code available for payment source | Source: Ambulatory Visit | Attending: Radiation Oncology | Admitting: Radiation Oncology

## 2022-10-22 DIAGNOSIS — Z510 Encounter for antineoplastic radiation therapy: Secondary | ICD-10-CM | POA: Diagnosis not present

## 2022-10-22 LAB — RAD ONC ARIA SESSION SUMMARY
Course Elapsed Days: 1
Plan Fractions Treated to Date: 2
Plan Prescribed Dose Per Fraction: 1.8 Gy
Plan Total Fractions Prescribed: 30
Plan Total Prescribed Dose: 54 Gy
Reference Point Dosage Given to Date: 3.6 Gy
Reference Point Session Dosage Given: 1.8 Gy
Session Number: 2

## 2022-10-22 NOTE — Telephone Encounter (Signed)
Telephone call to patient for follow up after receiving first infusion.   Patient states infusion went great.  States eating good and drinking plenty of fluids.   Denies any nausea or vomiting.  Encouraged patient to call for any concerns or questions. 

## 2022-10-23 ENCOUNTER — Ambulatory Visit
Admission: RE | Admit: 2022-10-23 | Discharge: 2022-10-23 | Disposition: A | Discharge status: Home / Self Care | Payer: No Typology Code available for payment source | Source: Ambulatory Visit | Attending: Radiation Oncology | Admitting: Radiation Oncology

## 2022-10-23 ENCOUNTER — Other Ambulatory Visit: Payer: Self-pay

## 2022-10-23 ENCOUNTER — Inpatient Hospital Stay: Payer: No Typology Code available for payment source

## 2022-10-23 VITALS — BP 127/78 | HR 76 | Resp 18

## 2022-10-23 DIAGNOSIS — C159 Malignant neoplasm of esophagus, unspecified: Secondary | ICD-10-CM

## 2022-10-23 DIAGNOSIS — Z510 Encounter for antineoplastic radiation therapy: Secondary | ICD-10-CM | POA: Diagnosis not present

## 2022-10-23 DIAGNOSIS — Z5111 Encounter for antineoplastic chemotherapy: Secondary | ICD-10-CM | POA: Diagnosis not present

## 2022-10-23 LAB — RAD ONC ARIA SESSION SUMMARY
Course Elapsed Days: 2
Plan Fractions Treated to Date: 3
Plan Prescribed Dose Per Fraction: 1.8 Gy
Plan Total Fractions Prescribed: 30
Plan Total Prescribed Dose: 54 Gy
Reference Point Dosage Given to Date: 5.4 Gy
Reference Point Session Dosage Given: 1.8 Gy
Session Number: 3

## 2022-10-23 MED ORDER — HEPARIN SOD (PORK) LOCK FLUSH 100 UNIT/ML IV SOLN
500.0000 [IU] | Freq: Once | INTRAVENOUS | Status: AC | PRN
  Administered 2022-10-23: 500 [IU]
  Filled 2022-10-23: qty 5

## 2022-10-23 MED ORDER — SODIUM CHLORIDE 0.9% FLUSH
10.0000 mL | INTRAVENOUS | Status: DC | PRN
  Administered 2022-10-23: 10 mL
  Filled 2022-10-23: qty 10

## 2022-10-24 ENCOUNTER — Encounter: Payer: Self-pay | Admitting: Oncology

## 2022-10-24 ENCOUNTER — Other Ambulatory Visit: Payer: Self-pay

## 2022-10-24 ENCOUNTER — Ambulatory Visit
Admission: RE | Admit: 2022-10-24 | Discharge: 2022-10-24 | Disposition: A | Discharge status: Home / Self Care | Payer: No Typology Code available for payment source | Source: Ambulatory Visit | Attending: Radiation Oncology | Admitting: Radiation Oncology

## 2022-10-24 DIAGNOSIS — Z510 Encounter for antineoplastic radiation therapy: Secondary | ICD-10-CM | POA: Diagnosis not present

## 2022-10-24 LAB — RAD ONC ARIA SESSION SUMMARY
Course Elapsed Days: 3
Plan Fractions Treated to Date: 4
Plan Prescribed Dose Per Fraction: 1.8 Gy
Plan Total Fractions Prescribed: 30
Plan Total Prescribed Dose: 54 Gy
Reference Point Dosage Given to Date: 7.2 Gy
Reference Point Session Dosage Given: 1.8 Gy
Session Number: 4

## 2022-10-25 ENCOUNTER — Ambulatory Visit
Admission: RE | Admit: 2022-10-25 | Discharge: 2022-10-25 | Disposition: A | Discharge status: Home / Self Care | Payer: No Typology Code available for payment source | Source: Ambulatory Visit | Attending: Radiation Oncology | Admitting: Radiation Oncology

## 2022-10-25 ENCOUNTER — Other Ambulatory Visit: Payer: Self-pay

## 2022-10-25 DIAGNOSIS — Z510 Encounter for antineoplastic radiation therapy: Secondary | ICD-10-CM | POA: Diagnosis not present

## 2022-10-25 DIAGNOSIS — C159 Malignant neoplasm of esophagus, unspecified: Secondary | ICD-10-CM | POA: Diagnosis not present

## 2022-10-25 LAB — RAD ONC ARIA SESSION SUMMARY
Course Elapsed Days: 4
Plan Fractions Treated to Date: 5
Plan Prescribed Dose Per Fraction: 1.8 Gy
Plan Total Fractions Prescribed: 30
Plan Total Prescribed Dose: 54 Gy
Reference Point Dosage Given to Date: 9 Gy
Reference Point Session Dosage Given: 1.8 Gy
Session Number: 5

## 2022-10-28 ENCOUNTER — Ambulatory Visit
Admission: RE | Admit: 2022-10-28 | Discharge: 2022-10-28 | Disposition: A | Discharge status: Home / Self Care | Payer: No Typology Code available for payment source | Source: Ambulatory Visit | Attending: Radiation Oncology | Admitting: Radiation Oncology

## 2022-10-28 ENCOUNTER — Inpatient Hospital Stay: Payer: No Typology Code available for payment source | Attending: Oncology

## 2022-10-28 ENCOUNTER — Inpatient Hospital Stay (HOSPITAL_BASED_OUTPATIENT_CLINIC_OR_DEPARTMENT_OTHER): Payer: No Typology Code available for payment source | Admitting: Nurse Practitioner

## 2022-10-28 ENCOUNTER — Inpatient Hospital Stay: Payer: No Typology Code available for payment source

## 2022-10-28 ENCOUNTER — Other Ambulatory Visit: Payer: Self-pay

## 2022-10-28 ENCOUNTER — Ambulatory Visit
Admission: RE | Admit: 2022-10-28 | Discharge: 2022-10-28 | Disposition: A | Discharge status: Home / Self Care | Payer: No Typology Code available for payment source | Source: Ambulatory Visit | Attending: Nurse Practitioner | Admitting: Nurse Practitioner

## 2022-10-28 ENCOUNTER — Encounter: Payer: Self-pay | Admitting: Nurse Practitioner

## 2022-10-28 VITALS — BP 117/78 | HR 73 | Temp 98.3°F | Resp 18

## 2022-10-28 DIAGNOSIS — E860 Dehydration: Secondary | ICD-10-CM

## 2022-10-28 DIAGNOSIS — Z95828 Presence of other vascular implants and grafts: Secondary | ICD-10-CM | POA: Insufficient documentation

## 2022-10-28 DIAGNOSIS — C159 Malignant neoplasm of esophagus, unspecified: Secondary | ICD-10-CM

## 2022-10-28 DIAGNOSIS — E876 Hypokalemia: Secondary | ICD-10-CM

## 2022-10-28 DIAGNOSIS — R110 Nausea: Secondary | ICD-10-CM

## 2022-10-28 DIAGNOSIS — Z8249 Family history of ischemic heart disease and other diseases of the circulatory system: Secondary | ICD-10-CM | POA: Insufficient documentation

## 2022-10-28 DIAGNOSIS — N200 Calculus of kidney: Secondary | ICD-10-CM | POA: Diagnosis not present

## 2022-10-28 DIAGNOSIS — I2510 Atherosclerotic heart disease of native coronary artery without angina pectoris: Secondary | ICD-10-CM | POA: Insufficient documentation

## 2022-10-28 DIAGNOSIS — Z803 Family history of malignant neoplasm of breast: Secondary | ICD-10-CM | POA: Insufficient documentation

## 2022-10-28 DIAGNOSIS — M542 Cervicalgia: Secondary | ICD-10-CM | POA: Insufficient documentation

## 2022-10-28 DIAGNOSIS — C155 Malignant neoplasm of lower third of esophagus: Secondary | ICD-10-CM | POA: Insufficient documentation

## 2022-10-28 DIAGNOSIS — I82621 Acute embolism and thrombosis of deep veins of right upper extremity: Secondary | ICD-10-CM

## 2022-10-28 DIAGNOSIS — Z7901 Long term (current) use of anticoagulants: Secondary | ICD-10-CM | POA: Insufficient documentation

## 2022-10-28 DIAGNOSIS — Z5111 Encounter for antineoplastic chemotherapy: Secondary | ICD-10-CM | POA: Insufficient documentation

## 2022-10-28 DIAGNOSIS — R634 Abnormal weight loss: Secondary | ICD-10-CM | POA: Diagnosis not present

## 2022-10-28 DIAGNOSIS — Z09 Encounter for follow-up examination after completed treatment for conditions other than malignant neoplasm: Secondary | ICD-10-CM

## 2022-10-28 DIAGNOSIS — I82C11 Acute embolism and thrombosis of right internal jugular vein: Secondary | ICD-10-CM | POA: Diagnosis not present

## 2022-10-28 DIAGNOSIS — Z8042 Family history of malignant neoplasm of prostate: Secondary | ICD-10-CM | POA: Diagnosis not present

## 2022-10-28 DIAGNOSIS — Z510 Encounter for antineoplastic radiation therapy: Secondary | ICD-10-CM | POA: Insufficient documentation

## 2022-10-28 DIAGNOSIS — R1310 Dysphagia, unspecified: Secondary | ICD-10-CM | POA: Insufficient documentation

## 2022-10-28 DIAGNOSIS — K8020 Calculus of gallbladder without cholecystitis without obstruction: Secondary | ICD-10-CM | POA: Diagnosis not present

## 2022-10-28 DIAGNOSIS — Z79899 Other long term (current) drug therapy: Secondary | ICD-10-CM | POA: Insufficient documentation

## 2022-10-28 DIAGNOSIS — C787 Secondary malignant neoplasm of liver and intrahepatic bile duct: Secondary | ICD-10-CM | POA: Diagnosis not present

## 2022-10-28 DIAGNOSIS — K760 Fatty (change of) liver, not elsewhere classified: Secondary | ICD-10-CM | POA: Insufficient documentation

## 2022-10-28 DIAGNOSIS — Z86718 Personal history of other venous thrombosis and embolism: Secondary | ICD-10-CM | POA: Diagnosis not present

## 2022-10-28 DIAGNOSIS — I82409 Acute embolism and thrombosis of unspecified deep veins of unspecified lower extremity: Secondary | ICD-10-CM | POA: Insufficient documentation

## 2022-10-28 HISTORY — DX: Acute embolism and thrombosis of unspecified deep veins of unspecified lower extremity: I82.409

## 2022-10-28 LAB — CMP (CANCER CENTER ONLY)
ALT: 33 U/L (ref 0–44)
AST: 31 U/L (ref 15–41)
Albumin: 3.5 g/dL (ref 3.5–5.0)
Alkaline Phosphatase: 45 U/L (ref 38–126)
Anion gap: 9 (ref 5–15)
BUN: 13 mg/dL (ref 8–23)
CO2: 25 mmol/L (ref 22–32)
Calcium: 8.6 mg/dL — ABNORMAL LOW (ref 8.9–10.3)
Chloride: 99 mmol/L (ref 98–111)
Creatinine: 0.97 mg/dL (ref 0.61–1.24)
GFR, Estimated: >60 mL/min (ref >60)
Glucose, Bld: 134 mg/dL — ABNORMAL HIGH (ref 70–99)
Potassium: 2.7 mmol/L — CL (ref 3.5–5.1)
Sodium: 133 mmol/L — ABNORMAL LOW (ref 135–145)
Total Bilirubin: 0.6 mg/dL (ref 0.3–1.2)
Total Protein: 7.4 g/dL (ref 6.5–8.1)

## 2022-10-28 LAB — CBC WITH DIFFERENTIAL (CANCER CENTER ONLY)
Abs Immature Granulocytes: 0.04 10*3/uL (ref 0.00–0.07)
Basophils Absolute: 0.1 10*3/uL (ref 0.0–0.1)
Basophils Relative: 1 %
Eosinophils Absolute: 0.3 10*3/uL (ref 0.0–0.5)
Eosinophils Relative: 4 %
HCT: 38.4 % — ABNORMAL LOW (ref 39.0–52.0)
Hemoglobin: 12.2 g/dL — ABNORMAL LOW (ref 13.0–17.0)
Immature Granulocytes: 1 %
Lymphocytes Relative: 14 %
Lymphs Abs: 1 10*3/uL (ref 0.7–4.0)
MCH: 26.1 pg (ref 26.0–34.0)
MCHC: 31.8 g/dL (ref 30.0–36.0)
MCV: 82.2 fL (ref 80.0–100.0)
Monocytes Absolute: 0.3 10*3/uL (ref 0.1–1.0)
Monocytes Relative: 5 %
Neutro Abs: 5.1 10*3/uL (ref 1.7–7.7)
Neutrophils Relative %: 75 %
Platelet Count: 332 10*3/uL (ref 150–400)
RBC: 4.67 MIL/uL (ref 4.22–5.81)
RDW: 13.2 % (ref 11.5–15.5)
WBC Count: 6.7 10*3/uL (ref 4.0–10.5)
nRBC: 0 % (ref 0.0–0.2)

## 2022-10-28 LAB — RAD ONC ARIA SESSION SUMMARY
Course Elapsed Days: 7
Plan Fractions Treated to Date: 6
Plan Prescribed Dose Per Fraction: 1.8 Gy
Plan Total Fractions Prescribed: 30
Plan Total Prescribed Dose: 54 Gy
Reference Point Dosage Given to Date: 10.8 Gy
Reference Point Session Dosage Given: 1.8 Gy
Session Number: 6

## 2022-10-28 MED ORDER — HEPARIN SOD (PORK) LOCK FLUSH 100 UNIT/ML IV SOLN
500.0000 [IU] | Freq: Once | INTRAVENOUS | Status: AC
  Administered 2022-10-28: 500 [IU]
  Filled 2022-10-28: qty 5

## 2022-10-28 MED ORDER — SODIUM CHLORIDE 0.9 % IV SOLN
INTRAVENOUS | Status: DC
  Filled 2022-10-28 (×2): qty 250

## 2022-10-28 MED ORDER — APIXABAN (ELIQUIS) VTE STARTER PACK (10MG AND 5MG): ORAL_TABLET | ORAL | 0 refills | Status: DC

## 2022-10-28 MED ORDER — POTASSIUM CHLORIDE 20 MEQ/100ML IV SOLN
20.0000 meq | Freq: Once | INTRAVENOUS | Status: AC
  Administered 2022-10-28: 20 meq via INTRAVENOUS

## 2022-10-28 MED ORDER — SODIUM CHLORIDE 0.9% FLUSH
10.0000 mL | Freq: Once | INTRAVENOUS | Status: AC
  Administered 2022-10-28: 10 mL via INTRAVENOUS
  Filled 2022-10-28: qty 10

## 2022-10-28 MED ORDER — ONDANSETRON HCL 4 MG/2ML IJ SOLN
8.0000 mg | Freq: Once | INTRAMUSCULAR | Status: AC
  Administered 2022-10-28: 8 mg via INTRAVENOUS
  Filled 2022-10-28: qty 4

## 2022-10-28 NOTE — Progress Notes (Signed)
Hematology/Oncology Consult Note [phone] [phone]   REFERRING PROVIDER: Maryland Pink, MD   CHIEF COMPLAINTS/PURPOSE OF CONSULTATION:  Esophageal adenocarcinoma  ASSESSMENT & PLAN:   Cancer Staging  Adenocarcinoma of esophagus Staging form: Esophagus - Adenocarcinoma, AJCC 8th Edition - Clinical stage from 08/28/2022: Stage IVB (cT3, cN1, cM1) - Signed by Earlie Server, MD on 10/04/2022  No problem-specific Assessment & Plan notes found for this encounter.  Adenocarcinoma of esophagus - s/p cycle 1 of folfox chemotherapy with palliative RT. Tolerated moderately well. No  Hypokalemia- d/t poor oral intake. Plan for 20 meq IV KCl today then continue oral potassium 20 meq BID.  Neck pain & swelling- etiology unclear. Question port associated discomfort vs vte? Will get u/s to evaluate. Imaging reviewed and occlusive DVT in the right IJ seen. Otherwise no evidence of other dvt. Start eliquis 2 tablets twice a day for 7 days for loading then 1 tablet twice daily for maintenance. Associated with port. Will defer to Dr [name] regarding option to treat with blood thinner and keep port in place vs port revision.  Weight loss- Awaiting eval with nutritionist  Dysphagia- receiving palliative radiation. IVF today.  Congestion- question allergies. Can trial flonase and zyrtec.  Nausea- likely secondary to chemotherapy. Plan for zofran 8 mg IV today. Continue home antiemetics as prescribed.   Orders Placed This Encounter  Procedures   US Venous Img Upper Uni Right(DVT)    Standing Status:   Future    Number of Occurrences:   1    Standing Expiration Date:   11/27/2022    Order Specific Question:   Reason for Exam (SYMPTOM  OR DIAGNOSIS REQUIRED)    Answer:   RIGHT neck swelling & Pain superior to port-a-cath.    Order Specific Question:   Preferred imaging location?    Answer:   [facility]   Follow-up: IVF with 20 meq of KCl & zofran 8 mg Stat US right upper  extremity Follow up as planned- la   All questions were answered. The patient knows to call the clinic with any problems, questions or concerns.  Beckey Rutter, DNP, AGNP-C, [facility] [phone] (clinic) 10/28/2022   HISTORY OF PRESENTING ILLNESS:  Jonathan [name] 67 y.o. male  Oncology History  Adenocarcinoma of esophagus  08/28/2022 Initial Diagnosis   Adenocarcinoma of esophagus   -Patient has noticed worsening of "food stuck/fullness" sensation since November 2023.  Patient had a barium swallow study which commented on marked mucosal irregularity in the distal esophagus with Broaddus base mural filling defect highly suspicious for malignancy.  Patient establish care with gastroenterology. -08/23/2022, EGD showed gastritis and partially obstructing malignant esophageal tumor in the lower third of the esophagus. Esophagus mass biopsy showed adenocarcinoma. Stomach biopsy showed gastric mucosa with no specific histology abnormality.  No significant intestinal metaplastic, dysplastic, granular atrophy or increased inflammation.     08/28/2022 Imaging   CT chest abdomen pelvis with contrast showed 1. Distal esophageal primary with gastrohepatic ligament nodal metastasis. 2. 2 right-sided pulmonary nodules, the largest of which measures 5 mm and is new since 2015. Pulmonary metastasis not be excluded. 3. Anterior right lower lobe volume loss and minimal soft tissue density, favoring atelectasis or scar. Recommend attention on follow-up. 4. Hepatic steatosis 5. Cholelithiasis 6. Left nephrolithiasis 7. Coronary artery atherosclerosis. Aortic Atherosclerosis   08/28/2022 Cancer Staging   Staging form: Esophagus - Adenocarcinoma, AJCC 8th Edition - Clinical stage from 08/28/2022: Stage IVB (cT3, cN1, cM1) - Signed  by Earlie Server, MD on 10/04/2022 Stage prefix: Initial diagnosis   09/06/2022 Imaging   PET scan showed 1. Esophageal primary with gastrohepatic  ligament nodal metastasis,as on CT. 2. Focus of hypermetabolism which is favored to registered to the posterior hepatic dome, in the region of subtle heterogeneity on prior diagnostic CT. Suboptimally evaluated secondary to underlying steatosis. Recommend further evaluation with pre and post contrast abdominal MRI to confirm probable metastasis. 3. Incidental findings, including: Left nephrolithiasis.Cholelithiasis. Coronary artery atherosclerosis. Aortic Atherosclerosis    09/14/2022 - 09/14/2022 Chemotherapy   Patient is on Treatment Plan : ESOPHAGUS Carboplatin + Paclitaxel Weekly X 6 Weeks with XRT     09/23/2022 Imaging   MRI abdomen with and without contrast showed 1. Mildly T2 hyperintense segment VII hepatic lesion measuring 2.7 cm with imaging characteristics compatible with metastatic disease. 2. Tiny focus of delayed enhancement in the inferior right lobe of the liver segment VI measuring 8 mm with ill-defined increased T2 signal and subtle corresponding reduced diffusivity, also suspicious for metastatic disease. 3. Partially visualized distal esophageal wall thickening compatible with the patient's known primary neoplasm. 4. Similar size of the 11 mm gastrohepatic ligament lymph node mildly metabolic on prior PET-CT and compatible with local nodal disease involvement. 5. 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 Recommend follow up pre and post-contrast MRI/MRCP in 1 year. 6. Diffuse hepatic steatosis.   10/10/2022 Procedure   LIVER MASS; CT-GUIDED BIOPSY:  - MODERATE TO POORLY DIFFERENTIATED ADENOCARCINOMA MORPHOLOGICALLY  CONSISTENT WITH METASTASIS FROM PATIENT'S KNOWN ESOPHAGEAL  ADENOCARCINOMA.    10/17/2022 Procedure   Medi port placed by Dr. Lucky Cowboy   10/21/2022 -  Chemotherapy   Patient is on Treatment Plan : ESOPHAGEAL ADENOCARCINOMA FOLFOX q14d x 6 cycles     He is not taking PPI. He has  intentionally lost some weight. Family history positive for father and paternal uncle with prostate cancer and sister with breast cancer. Denies any routine alcohol use  Never smoker.   INTERVAL HISTORY Jonathan [name] is a 67 y.o. male with above history of stage IV esophageal adenocarcinoma who presents to clinic for chemotherapy follow up. He complains of right neck pain and swelliing that started over the weekend. Has sinus drainage and congestion. Continues to receive radiation.    MEDICAL HISTORY:  Past Medical History:  Diagnosis Date   Arthritis    Complication of anesthesia    OCCURRED ONCE YEARS AGO 1981   Esophageal mass    Hypertension    Hypothyroidism    PONV (postoperative nausea and vomiting)     SURGICAL HISTORY: Past Surgical History:  Procedure Laterality Date   COLONOSCOPY     ESOPHAGOGASTRODUODENOSCOPY N/A 08/23/2022   Procedure: ESOPHAGOGASTRODUODENOSCOPY (EGD);  Surgeon: Annamaria Helling, DO;  Location: [facility] ENDOSCOPY;  Service: Gastroenterology;  Laterality: N/A;   EUS N/A 09/12/2022   Procedure: FULL UPPER ENDOSCOPIC ULTRASOUND (EUS) RADIAL;  Surgeon: Holly Bodily, MD;  Location: [facility] ENDOSCOPY;  Service: Gastroenterology;  Laterality: N/A;   FINGER SURGERY     PORTA CATH INSERTION N/A 10/17/2022   Procedure: PORTA CATH INSERTION;  Surgeon: Algernon Huxley, MD;  Location: [place] [facility] CV LAB;  Service: Cardiovascular;  Laterality: N/A;   TENDON REPAIR IN LEFT KNEE      SOCIAL HISTORY: Social History   Socioeconomic History   Marital status: Single    Spouse name: Not on file   Number of children:  Not on file   Years of education: Not on file   Highest education level: Not on file  Occupational History   Not on file  Tobacco Use   Smoking status: Never   Smokeless tobacco: Never  Vaping Use   Vaping Use: Never used  Substance and Sexual Activity   Alcohol use: Yes    Comment: OCCASIONALLY   Drug use: Never   Sexual activity:  Not on file  Other Topics Concern   Not on file  Social History Narrative   Not on file   Social Determinants of Health   Financial Resource Strain: Not on file  Food Insecurity: No Food Insecurity (08/28/2022)   Hunger Vital Sign    Worried About Running Out of Food in the Last Year: Never true    Ran Out of Food in the Last Year: Never true  Transportation Needs: No Transportation Needs (08/28/2022)   PRAPARE - Hydrologist (Medical): No    Lack of Transportation (Non-Medical): No  Physical Activity: Not on file  Stress: Not on file  Social Connections: Not on file  Intimate Partner Violence: Not At Risk (08/28/2022)   Humiliation, Afraid, Rape, and Kick questionnaire    Fear of Current or Ex-Partner: No    Emotionally Abused: No    Physically Abused: No    Sexually Abused: No    FAMILY HISTORY: Family History  Problem Relation Age of Onset   Heart attack Mother    Prostate cancer Father    Breast cancer Sister    Prostate cancer Paternal Uncle     ALLERGIES:  has No Known Allergies.  MEDICATIONS:  Current Outpatient Medications  Medication Sig Dispense Refill   acetaminophen (TYLENOL) 650 MG CR tablet Take 1,300 mg by mouth every 8 (eight) hours as needed for pain.     amLODipine (NORVASC) 5 MG tablet Take 5 mg by mouth daily.     atenolol (TENORMIN) 50 MG tablet Take 50 mg by mouth daily.     Cholecalciferol (VITAMIN D3) 125 MCG (5000 UT) CAPS Take 5,000 Units by mouth daily.     hydrochlorothiazide (HYDRODIURIL) 25 MG tablet Take 25 mg by mouth daily.     levothyroxine (SYNTHROID) 100 MCG tablet Take 100 mcg by mouth daily before breakfast.     lidocaine-prilocaine (EMLA) cream Apply to affected area once 30 g 6   Multiple Vitamins-Minerals (MULTIVITAMIN WITH MINERALS) tablet Take 1 tablet by mouth daily.     omeprazole (PRILOSEC) 20 MG capsule Take 20 mg by mouth daily.     ondansetron (ZOFRAN) 8 MG tablet Take 1 tablet (8 mg total) by  mouth every 8 (eight) hours as needed for nausea or vomiting. Start on the third day after chemotherapy. 30 tablet 1   potassium chloride SA (KLOR-CON M) 20 MEQ tablet Take 1 tablet (20 mEq total) by mouth daily. 30 tablet 0   prochlorperazine (COMPAZINE) 10 MG tablet Take 1 tablet (10 mg total) by mouth every 6 (six) hours as needed for nausea or vomiting. 30 tablet 1   zinc gluconate 50 MG tablet Take 50 mg by mouth daily.     No current facility-administered medications for this visit.    Review of Systems  Constitutional:  Negative for appetite change, chills, fatigue, fever and unexpected weight change.  HENT:   Positive for sore throat and trouble swallowing. Negative for hearing loss and voice change.   Eyes:  Negative for eye problems and icterus.  Respiratory:  Negative for chest tightness, cough and shortness of breath.   Cardiovascular:  Negative for chest pain and leg swelling.  Gastrointestinal:  Negative for abdominal distention, abdominal pain, blood in stool, constipation, diarrhea, nausea, rectal pain and vomiting.       Dysphagia/epigastric fullness  Endocrine: Negative for hot flashes.  Genitourinary:  Negative for difficulty urinating, dysuria and frequency.   Musculoskeletal:  Negative for arthralgias.  Skin:  Negative for itching and rash.  Neurological:  Negative for light-headedness and numbness.  Hematological:  Negative for adenopathy. Does not bruise/bleed easily.  Psychiatric/Behavioral:  Negative for confusion and depression.    PHYSICAL EXAMINATION: ECOG PERFORMANCE STATUS: 1 - Symptomatic but completely ambulatory  Vitals:   10/28/22 0930  BP: 117/78  Pulse: 73  Resp: 18  Temp: 98.3 F (36.8 C)   There were no vitals filed for this visit.  Physical Exam Constitutional:      Appearance: He is not ill-appearing.  Eyes:     General: No scleral icterus.    Conjunctiva/sclera: Conjunctivae normal.  Cardiovascular:     Rate and Rhythm: Normal rate  and regular rhythm.  Abdominal:     General: There is no distension.     Palpations: Abdomen is soft.     Tenderness: There is no abdominal tenderness. There is no guarding.  Musculoskeletal:        General: No deformity.     Cervical back: Tenderness (palpable tenderness to right IJ. Port in right neck.) present.     Right lower leg: No edema.     Left lower leg: No edema.  Lymphadenopathy:     Cervical: No cervical adenopathy.  Skin:    General: Skin is warm and dry.  Neurological:     Mental Status: He is alert and oriented to person, place, and time. Mental status is at baseline.  Psychiatric:        Mood and Affect: Mood normal.        Behavior: Behavior normal.      LABORATORY DATA:  I have reviewed the data as listed    Latest Ref Rng & Units 10/28/2022    9:29 AM 10/21/2022    8:41 AM 10/10/2022    7:47 AM  CBC  WBC 4.0 - 10.5 K/uL 6.7  6.6  6.9   Hemoglobin 13.0 - 17.0 g/dL 12.2  12.7  13.0   Hematocrit 39.0 - 52.0 % 38.4  39.9  40.1   Platelets 150 - 400 K/uL 332  308  306       Latest Ref Rng & Units 10/28/2022    9:29 AM 10/21/2022    8:41 AM 10/10/2022    7:47 AM  CMP  Glucose 70 - 99 mg/dL 134  141  118   BUN 8 - 23 mg/dL 13  10  11    Creatinine 0.61 - 1.24 mg/dL 0.97  1.04  0.96   Sodium 135 - 145 mmol/L 133  139  137   Potassium 3.5 - 5.1 mmol/L 2.7  3.0  3.1   Chloride 98 - 111 mmol/L 99  104  106   CO2 22 - 32 mmol/L 25  27  24    Calcium 8.9 - 10.3 mg/dL 8.6  9.2  8.9   Total Protein 6.5 - 8.1 g/dL 7.4  7.6  7.5   Total Bilirubin 0.3 - 1.2 mg/dL 0.6  0.6  0.8   Alkaline Phos 38 - 126 U/L 45  53  49  AST 15 - 41 U/L 31  35  40   ALT 0 - 44 U/L 33  38  49      RADIOGRAPHIC STUDIES: I have personally reviewed the radiological images as listed and agreed with the findings in the report. PERIPHERAL VASCULAR CATHETERIZATION  Result Date: 10/17/2022 See surgical note for result.  CT LIVER MASS BIOPSY  Result Date: 10/10/2022 INDICATION: History of  esophageal cancer with hypermetabolic liver mass on PET. EXAM: CT-GUIDED LIVER MASS BIOPSY COMPARISON:  CT CAP, 08/26/2022. PET-CT, 09/05/2022. MR, 09/20/2022. MEDICATIONS: 10 mL lidocaine 1% ANESTHESIA/SEDATION: Moderate (conscious) sedation was employed during this procedure. A total of Versed 2 mg and Fentanyl 100 mcg was administered intravenously. Moderate Sedation Time: 10 minutes. The patient's level of consciousness and vital signs were monitored continuously by radiology nursing throughout the procedure under my direct supervision. CONTRAST:  None. COMPLICATIONS: None immediate. PROCEDURE: RADIATION DOSE REDUCTION: This exam was performed according to the departmental dose-optimization program which includes automated exposure control, adjustment of the mA and/or kV according to patient size and/or use of iterative reconstruction technique. Informed consent was obtained from the the patient and/or patient's representative following an explanation of the procedure, risks, benefits and alternatives. A time out was performed prior to the initiation of the procedure. The patient was positioned supine on the CT table and a limited CT was performed for procedural planning demonstrating superior RIGHT hepatic lobe mass, difficult to perceive on procedural CT. The procedure was planned. The operative site was prepped and draped in the usual sterile fashion. Appropriate trajectory was confirmed with a 22 gauge spinal needle after the adjacent tissues were anesthetized with 1% Lidocaine with epinephrine. Under intermittent CT guidance, a 17 gauge coaxial needle was advanced into the peripheral aspect of the mass. Appropriate positioning was confirmed and 4 samples were obtained with an 18 gauge core needle biopsy device. Gelfoam slurry injection was performed for hemostasis. The co-axial needle was removed and hemostasis was achieved with manual compression. A limited postprocedural CT was negative for hemorrhage or  additional complication. A dressing was placed. The patient tolerated the procedure well without immediate postprocedural complication. IMPRESSION: 1. Challenging biopsy with superiorly-located RIGHT hepatic lobe mass near the liver dome, and difficult to perceive on procedural CT. 2. Successful CT guided core needle biopsy of liver mass. Michaelle Birks, MD [facility] Electronically Signed   By: Michaelle Birks M.D.   On: 10/10/2022 10:03

## 2022-10-29 ENCOUNTER — Other Ambulatory Visit: Payer: Self-pay

## 2022-10-29 ENCOUNTER — Ambulatory Visit
Admission: RE | Admit: 2022-10-29 | Discharge: 2022-10-29 | Disposition: A | Discharge status: Home / Self Care | Payer: No Typology Code available for payment source | Source: Ambulatory Visit | Attending: Radiation Oncology | Admitting: Radiation Oncology

## 2022-10-29 DIAGNOSIS — E876 Hypokalemia: Secondary | ICD-10-CM | POA: Diagnosis not present

## 2022-10-29 DIAGNOSIS — J309 Allergic rhinitis, unspecified: Secondary | ICD-10-CM | POA: Diagnosis not present

## 2022-10-29 DIAGNOSIS — Z0000 Encounter for general adult medical examination without abnormal findings: Secondary | ICD-10-CM | POA: Diagnosis not present

## 2022-10-29 DIAGNOSIS — I8290 Acute embolism and thrombosis of unspecified vein: Secondary | ICD-10-CM | POA: Diagnosis not present

## 2022-10-29 DIAGNOSIS — C159 Malignant neoplasm of esophagus, unspecified: Secondary | ICD-10-CM | POA: Diagnosis not present

## 2022-10-29 DIAGNOSIS — R1310 Dysphagia, unspecified: Secondary | ICD-10-CM | POA: Diagnosis not present

## 2022-10-29 DIAGNOSIS — Z510 Encounter for antineoplastic radiation therapy: Secondary | ICD-10-CM | POA: Diagnosis not present

## 2022-10-29 LAB — RAD ONC ARIA SESSION SUMMARY
Course Elapsed Days: 8
Plan Fractions Treated to Date: 7
Plan Prescribed Dose Per Fraction: 1.8 Gy
Plan Total Fractions Prescribed: 30
Plan Total Prescribed Dose: 54 Gy
Reference Point Dosage Given to Date: 12.6 Gy
Reference Point Session Dosage Given: 1.8 Gy
Session Number: 7

## 2022-10-30 ENCOUNTER — Other Ambulatory Visit: Payer: Self-pay

## 2022-10-30 ENCOUNTER — Ambulatory Visit
Admission: RE | Admit: 2022-10-30 | Discharge: 2022-10-30 | Disposition: A | Discharge status: Home / Self Care | Payer: No Typology Code available for payment source | Source: Ambulatory Visit | Attending: Radiation Oncology | Admitting: Radiation Oncology

## 2022-10-30 DIAGNOSIS — Z510 Encounter for antineoplastic radiation therapy: Secondary | ICD-10-CM | POA: Diagnosis not present

## 2022-10-30 LAB — RAD ONC ARIA SESSION SUMMARY
Course Elapsed Days: 9
Plan Fractions Treated to Date: 8
Plan Prescribed Dose Per Fraction: 1.8 Gy
Plan Total Fractions Prescribed: 30
Plan Total Prescribed Dose: 54 Gy
Reference Point Dosage Given to Date: 14.4 Gy
Reference Point Session Dosage Given: 1.8 Gy
Session Number: 8

## 2022-10-31 ENCOUNTER — Ambulatory Visit
Admission: RE | Admit: 2022-10-31 | Discharge: 2022-10-31 | Disposition: A | Discharge status: Home / Self Care | Payer: No Typology Code available for payment source | Source: Ambulatory Visit | Attending: Radiation Oncology | Admitting: Radiation Oncology

## 2022-10-31 ENCOUNTER — Other Ambulatory Visit: Payer: Self-pay

## 2022-10-31 DIAGNOSIS — Z510 Encounter for antineoplastic radiation therapy: Secondary | ICD-10-CM | POA: Diagnosis not present

## 2022-10-31 LAB — RAD ONC ARIA SESSION SUMMARY
Course Elapsed Days: 10
Plan Fractions Treated to Date: 9
Plan Prescribed Dose Per Fraction: 1.8 Gy
Plan Total Fractions Prescribed: 30
Plan Total Prescribed Dose: 54 Gy
Reference Point Dosage Given to Date: 16.2 Gy
Reference Point Session Dosage Given: 1.8 Gy
Session Number: 9

## 2022-11-01 ENCOUNTER — Ambulatory Visit
Admission: RE | Admit: 2022-11-01 | Discharge: 2022-11-01 | Disposition: A | Discharge status: Home / Self Care | Payer: No Typology Code available for payment source | Source: Ambulatory Visit | Attending: Radiation Oncology | Admitting: Radiation Oncology

## 2022-11-01 ENCOUNTER — Other Ambulatory Visit: Payer: Self-pay

## 2022-11-01 DIAGNOSIS — R1319 Other dysphagia: Secondary | ICD-10-CM | POA: Diagnosis not present

## 2022-11-01 DIAGNOSIS — E876 Hypokalemia: Secondary | ICD-10-CM | POA: Diagnosis not present

## 2022-11-01 DIAGNOSIS — Z510 Encounter for antineoplastic radiation therapy: Secondary | ICD-10-CM | POA: Diagnosis not present

## 2022-11-01 DIAGNOSIS — R634 Abnormal weight loss: Secondary | ICD-10-CM | POA: Diagnosis not present

## 2022-11-01 DIAGNOSIS — C159 Malignant neoplasm of esophagus, unspecified: Secondary | ICD-10-CM | POA: Diagnosis not present

## 2022-11-01 DIAGNOSIS — Z5111 Encounter for antineoplastic chemotherapy: Secondary | ICD-10-CM | POA: Diagnosis not present

## 2022-11-01 DIAGNOSIS — Z7189 Other specified counseling: Secondary | ICD-10-CM | POA: Diagnosis not present

## 2022-11-01 LAB — RAD ONC ARIA SESSION SUMMARY
Course Elapsed Days: 11
Plan Fractions Treated to Date: 10
Plan Prescribed Dose Per Fraction: 1.8 Gy
Plan Total Fractions Prescribed: 30
Plan Total Prescribed Dose: 54 Gy
Reference Point Dosage Given to Date: 18 Gy
Reference Point Session Dosage Given: 1.8 Gy
Session Number: 10

## 2022-11-01 MED FILL — Dexamethasone Sodium Phosphate Inj 100 MG/10ML: INTRAMUSCULAR | Qty: 1 | Status: AC

## 2022-11-03 DIAGNOSIS — C159 Malignant neoplasm of esophagus, unspecified: Secondary | ICD-10-CM | POA: Diagnosis not present

## 2022-11-04 ENCOUNTER — Inpatient Hospital Stay: Payer: No Typology Code available for payment source

## 2022-11-04 ENCOUNTER — Inpatient Hospital Stay (HOSPITAL_BASED_OUTPATIENT_CLINIC_OR_DEPARTMENT_OTHER): Payer: No Typology Code available for payment source | Admitting: Oncology

## 2022-11-04 ENCOUNTER — Ambulatory Visit
Admission: RE | Admit: 2022-11-04 | Discharge: 2022-11-04 | Disposition: A | Discharge status: Home / Self Care | Payer: No Typology Code available for payment source | Source: Ambulatory Visit | Attending: Radiation Oncology | Admitting: Radiation Oncology

## 2022-11-04 ENCOUNTER — Other Ambulatory Visit: Payer: Self-pay

## 2022-11-04 ENCOUNTER — Encounter: Payer: Self-pay | Admitting: Oncology

## 2022-11-04 VITALS — BP 126/79 | HR 74 | Temp 97.4°F | Wt 244.1 lb

## 2022-11-04 DIAGNOSIS — C159 Malignant neoplasm of esophagus, unspecified: Secondary | ICD-10-CM | POA: Diagnosis not present

## 2022-11-04 DIAGNOSIS — Z5111 Encounter for antineoplastic chemotherapy: Secondary | ICD-10-CM

## 2022-11-04 DIAGNOSIS — Z7189 Other specified counseling: Secondary | ICD-10-CM

## 2022-11-04 DIAGNOSIS — E876 Hypokalemia: Secondary | ICD-10-CM | POA: Diagnosis not present

## 2022-11-04 DIAGNOSIS — R1319 Other dysphagia: Secondary | ICD-10-CM

## 2022-11-04 DIAGNOSIS — Z510 Encounter for antineoplastic radiation therapy: Secondary | ICD-10-CM | POA: Diagnosis not present

## 2022-11-04 DIAGNOSIS — R634 Abnormal weight loss: Secondary | ICD-10-CM | POA: Diagnosis not present

## 2022-11-04 LAB — CMP (CANCER CENTER ONLY)
ALT: 29 U/L (ref 0–44)
AST: 29 U/L (ref 15–41)
Albumin: 3.5 g/dL (ref 3.5–5.0)
Alkaline Phosphatase: 48 U/L (ref 38–126)
Anion gap: 7 (ref 5–15)
BUN: 11 mg/dL (ref 8–23)
CO2: 26 mmol/L (ref 22–32)
Calcium: 9 mg/dL (ref 8.9–10.3)
Chloride: 105 mmol/L (ref 98–111)
Creatinine: 1.01 mg/dL (ref 0.61–1.24)
GFR, Estimated: >60 mL/min (ref >60)
Glucose, Bld: 107 mg/dL — ABNORMAL HIGH (ref 70–99)
Potassium: 3.2 mmol/L — ABNORMAL LOW (ref 3.5–5.1)
Sodium: 138 mmol/L (ref 135–145)
Total Bilirubin: 0.5 mg/dL (ref 0.3–1.2)
Total Protein: 7.3 g/dL (ref 6.5–8.1)

## 2022-11-04 LAB — RAD ONC ARIA SESSION SUMMARY
Course Elapsed Days: 14
Plan Fractions Treated to Date: 11
Plan Prescribed Dose Per Fraction: 1.8 Gy
Plan Total Fractions Prescribed: 30
Plan Total Prescribed Dose: 54 Gy
Reference Point Dosage Given to Date: 19.8 Gy
Reference Point Session Dosage Given: 1.8 Gy
Session Number: 11

## 2022-11-04 LAB — CBC WITH DIFFERENTIAL (CANCER CENTER ONLY)
Abs Immature Granulocytes: 0 10*3/uL (ref 0.00–0.07)
Basophils Absolute: 0.1 10*3/uL (ref 0.0–0.1)
Basophils Relative: 2 %
Eosinophils Absolute: 0.1 10*3/uL (ref 0.0–0.5)
Eosinophils Relative: 3 %
HCT: 37.6 % — ABNORMAL LOW (ref 39.0–52.0)
Hemoglobin: 12.2 g/dL — ABNORMAL LOW (ref 13.0–17.0)
Immature Granulocytes: 0 %
Lymphocytes Relative: 24 %
Lymphs Abs: 0.9 10*3/uL (ref 0.7–4.0)
MCH: 26.5 pg (ref 26.0–34.0)
MCHC: 32.4 g/dL (ref 30.0–36.0)
MCV: 81.6 fL (ref 80.0–100.0)
Monocytes Absolute: 0.5 10*3/uL (ref 0.1–1.0)
Monocytes Relative: 14 %
Neutro Abs: 2.3 10*3/uL (ref 1.7–7.7)
Neutrophils Relative %: 57 %
Platelet Count: 309 10*3/uL (ref 150–400)
RBC: 4.61 MIL/uL (ref 4.22–5.81)
RDW: 13.7 % (ref 11.5–15.5)
WBC Count: 3.9 10*3/uL — ABNORMAL LOW (ref 4.0–10.5)
nRBC: 0 % (ref 0.0–0.2)

## 2022-11-04 MED ORDER — SODIUM CHLORIDE 0.9 % IV SOLN
2400.0000 mg/m2 | INTRAVENOUS | Status: DC
  Administered 2022-11-04: 5600 mg via INTRAVENOUS
  Filled 2022-11-04: qty 112

## 2022-11-04 MED ORDER — SODIUM CHLORIDE 0.9 % IV SOLN
10.0000 mg | Freq: Once | INTRAVENOUS | Status: AC
  Administered 2022-11-04: 10 mg via INTRAVENOUS
  Filled 2022-11-04: qty 10

## 2022-11-04 MED ORDER — DEXTROSE 5 % IV SOLN
Freq: Once | INTRAVENOUS | Status: AC
  Filled 2022-11-04: qty 250

## 2022-11-04 MED ORDER — FLUOROURACIL CHEMO INJECTION 2.5 GM/50ML
400.0000 mg/m2 | Freq: Once | INTRAVENOUS | Status: AC
  Administered 2022-11-04: 1000 mg via INTRAVENOUS
  Filled 2022-11-04: qty 20

## 2022-11-04 MED ORDER — PALONOSETRON HCL INJECTION 0.25 MG/5ML
0.2500 mg | Freq: Once | INTRAVENOUS | Status: AC
  Administered 2022-11-04: 0.25 mg via INTRAVENOUS
  Filled 2022-11-04: qty 5

## 2022-11-04 MED ORDER — LEUCOVORIN CALCIUM INJECTION 350 MG
400.0000 mg/m2 | Freq: Once | INTRAVENOUS | Status: AC
  Administered 2022-11-04: 936 mg via INTRAVENOUS
  Filled 2022-11-04: qty 46.8

## 2022-11-04 MED ORDER — POTASSIUM CHLORIDE CRYS ER 20 MEQ PO TBCR: 20.0000 meq | EXTENDED_RELEASE_TABLET | Freq: Two times a day (BID) | ORAL | 0 refills | Status: DC

## 2022-11-04 MED ORDER — APIXABAN 5 MG PO TABS: 5.0000 mg | ORAL_TABLET | Freq: Two times a day (BID) | ORAL | 1 refills | Status: DC

## 2022-11-04 MED ORDER — OXALIPLATIN CHEMO INJECTION 100 MG/20ML
75.0000 mg/m2 | Freq: Once | INTRAVENOUS | Status: AC
  Administered 2022-11-04: 175 mg via INTRAVENOUS
  Filled 2022-11-04: qty 15

## 2022-11-04 NOTE — Assessment & Plan Note (Signed)
continue potassium supplementation BID, Rx sent

## 2022-11-04 NOTE — Progress Notes (Signed)
Hematology/Oncology Progress note [phone] [phone]        REFERRING PROVIDER: Rickard Patience, MD  CHIEF COMPLAINTS/PURPOSE OF CONSULTATION:  Esophageal adenocarcinoma  ASSESSMENT & PLAN:   Cancer Staging  Adenocarcinoma of esophagus Staging form: Esophagus - Adenocarcinoma, AJCC 8th Edition - Clinical stage from 08/28/2022: Stage IVB (cT3, cN1, cM1) - Signed by Rickard Patience, MD on 10/04/2022   Adenocarcinoma of esophagus [facility]) EGD findings, pathology reports, CT, MRI, PET scan results were reviewed with patient Stage IV esophageal adenocarcinoma.  I recommend systematic palliative chemotherapy with FOLFOX [Oxaliplatin 75mg /m2 given that he will be on palliative RT and may have increased toxicity] .  Labs are reviewed and discussed with patient. Proceed with cycle 2 FOLFOX   Check [employer] NGS  Hypokalemia continue potassium supplementation BID, Rx sent  Encounter for antineoplastic chemotherapy Chemotherapy plan as listed above.   Weight loss Follow up with nutritionist   Dysphagia Continue follow up with Rad Onc. Patient gets palliative radiation.   Goals of care, counseling/discussion Discussed with patient today.    Orders Placed This Encounter  Procedures   CBC with Differential (Cancer Center Only)    Standing Status:   Future    Standing Expiration Date:   12/02/2023   CMP (Cancer Center only)    Standing Status:   Future    Standing Expiration Date:   12/02/2023   Follow-up  2 weeks lab MD FOLFOX D3 pump dc  All questions were answered. The patient knows to call the clinic with any problems, questions or concerns.  Rickard Patience, MD, PhD [facility] Hematology Oncology 11/04/2022   HISTORY OF PRESENTING ILLNESS:  Jonathan [name] 67 y.o. male presents to establish care for esophageal adenocarcinoma I have reviewed his chart and materials related to his cancer extensively and collaborated history with the patient. Summary of oncologic  history is as follows: Oncology History  Adenocarcinoma of esophagus  08/28/2022 Initial Diagnosis   Adenocarcinoma of esophagus   -Patient has noticed worsening of "food stuck/fullness" sensation since November 2023.  Patient had a barium swallow study which commented on marked mucosal irregularity in the distal esophagus with Broaddus base mural filling defect highly suspicious for malignancy.  Patient establish care with gastroenterology. -08/23/2022, EGD showed gastritis and partially obstructing malignant esophageal tumor in the lower third of the esophagus. Esophagus mass biopsy showed adenocarcinoma.  PD-L1 TPS 65% Stomach biopsy showed gastric mucosa with no specific histology abnormality.  No significant intestinal metaplastic, dysplastic, granular atrophy or increased inflammation.     08/28/2022 Imaging   CT chest abdomen pelvis with contrast showed 1. Distal esophageal primary with gastrohepatic ligament nodal metastasis. 2. 2 right-sided pulmonary nodules, the largest of which measures 5 mm and is new since 2015. Pulmonary metastasis not be excluded. 3. Anterior right lower lobe volume loss and minimal soft tissue density, favoring atelectasis or scar. Recommend attention on follow-up. 4. Hepatic steatosis 5. Cholelithiasis 6. Left nephrolithiasis 7. Coronary artery atherosclerosis. Aortic Atherosclerosis   08/28/2022 Cancer Staging   Staging form: Esophagus - Adenocarcinoma, AJCC 8th Edition - Clinical stage from 08/28/2022: Stage IVB (cT3, cN1, cM1) - Signed by Rickard Patience, MD on 10/04/2022 Stage prefix: Initial diagnosis   09/06/2022 Imaging   PET scan showed 1. Esophageal primary with gastrohepatic ligament nodal metastasis,as on CT. 2. Focus of hypermetabolism which is favored to registered to the posterior hepatic dome, in the region of subtle heterogeneity on prior diagnostic CT. Suboptimally evaluated secondary to underlying steatosis. Recommend further evaluation  with pre  and post contrast abdominal MRI to confirm probable metastasis. 3. Incidental findings, including: Left nephrolithiasis.Cholelithiasis. Coronary artery atherosclerosis. Aortic Atherosclerosis    09/14/2022 - 09/14/2022 Chemotherapy   Patient is on Treatment Plan : ESOPHAGUS Carboplatin + Paclitaxel Weekly X 6 Weeks with XRT     09/23/2022 Imaging   MRI abdomen with and without contrast showed 1. Mildly T2 hyperintense segment VII hepatic lesion measuring 2.7 cm with imaging characteristics compatible with metastatic disease. 2. Tiny focus of delayed enhancement in the inferior right lobe of the liver segment VI measuring 8 mm with ill-defined increased T2 signal and subtle corresponding reduced diffusivity, also suspicious for metastatic disease. 3. Partially visualized distal esophageal wall thickening compatible with the patient's known primary neoplasm. 4. Similar size of the 11 mm gastrohepatic ligament lymph node mildly metabolic on prior PET-CT and compatible with local nodal disease involvement. 5. 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 Recommend follow up pre and post-contrast MRI/MRCP in 1 year. 6. Diffuse hepatic steatosis.   10/10/2022 Procedure   LIVER MASS; CT-GUIDED BIOPSY:  - MODERATE TO POORLY DIFFERENTIATED ADENOCARCINOMA MORPHOLOGICALLY  CONSISTENT WITH METASTASIS FROM PATIENT'S KNOWN ESOPHAGEAL  ADENOCARCINOMA.    10/17/2022 Procedure   Medi port placed by Dr. Wyn Quakerew   10/21/2022 -  Chemotherapy   Patient is on Treatment Plan : ESOPHAGEAL ADENOCARCINOMA FOLFOX q14d x 6 cycles     Patient presents to establish care.  He is not taking PPI. He has intentionally lost some weight. Family history positive for father and paternal uncle with prostate cancer and sister with breast cancer. Denies any routine alcohol use  Never smoker.   INTERVAL HISTORY Jonathan [name] is a 67 y.o. male  who has above history reviewed by me today presents for follow up visit for Stage IV esophageal adenocarcinoma.  Right interval jugular vein occlusive DVT, started on Eliquis starter package on 10/28/2022, swelling of neck has improved.  He tolerated chemotherapy and radiation. Dysphagias has improved.  Denies neuropathy  MEDICAL HISTORY:  Past Medical History:  Diagnosis Date   Arthritis    Complication of anesthesia    OCCURRED ONCE YEARS AGO 1981   Esophageal mass    Hypertension    Hypothyroidism    PONV (postoperative nausea and vomiting)     SURGICAL HISTORY: Past Surgical History:  Procedure Laterality Date   COLONOSCOPY     ESOPHAGOGASTRODUODENOSCOPY N/A 08/23/2022   Procedure: ESOPHAGOGASTRODUODENOSCOPY (EGD);  Surgeon: Jaynie Collinsusso, Steven Michael, DO;  [facility] ENDOSCOPY;  Service: Gastroenterology;  Laterality: N/A;   EUS N/A 09/12/2022   Procedure: FULL UPPER ENDOSCOPIC ULTRASOUND (EUS) RADIAL;  Surgeon: Bearl MulberryBurbridge, Rebecca A, MD;  Location[facility];  Service: Gastroenterology;  Laterality: N/A;   FINGER SURGERY     PORTA CATH INSERTION N/A 10/17/2022   Procedure: PORTA CATH INSERTION;  Surgeon: Annice Needyew, Jason S, MD;  Location: [facility] INVASIVE CV LAB;  Service: Cardiovascular;  Laterality: N/A;   TENDON REPAIR IN LEFT KNEE      SOCIAL HISTORY: Social History   Socioeconomic History   Marital status: Single    Spouse name: Not on file   Number of children: Not on file   Years of education: Not on file   Highest education level: Not on file  Occupational History   Not on file  Tobacco Use   Smoking status: Never   Smokeless tobacco: Never  Vaping Use   Vaping Use:  Never used  Substance and Sexual Activity   Alcohol use: Yes    Comment: OCCASIONALLY   Drug use: Never   Sexual activity: Not on file  Other Topics Concern   Not on file  Social History Narrative   Not on file   Social Determinants of Health   Financial Resource Strain: Not on file  Food  Insecurity: No Food Insecurity (08/28/2022)   Hunger Vital Sign    Worried About Running Out of Food in the Last Year: Never true    Ran Out of Food in the Last Year: Never true  Transportation Needs: No Transportation Needs (08/28/2022)   PRAPARE - Administrator, Civil Service (Medical): No    Lack of Transportation (Non-Medical): No  Physical Activity: Not on file  Stress: Not on file  Social Connections: Not on file  Intimate Partner Violence: Not At Risk (08/28/2022)   Humiliation, Afraid, Rape, and Kick questionnaire    Fear of Current or Ex-Partner: No    Emotionally Abused: No    Physically Abused: No    Sexually Abused: No    FAMILY HISTORY: Family History  Problem Relation Age of Onset   Heart attack Mother    Prostate cancer Father    Breast cancer Sister    Prostate cancer Paternal Uncle     ALLERGIES:  has No Known Allergies.  MEDICATIONS:  Current Outpatient Medications  Medication Sig Dispense Refill   acetaminophen (TYLENOL) 650 MG CR tablet Take 1,300 mg by mouth every 8 (eight) hours as needed for pain.     amLODipine (NORVASC) 5 MG tablet Take 5 mg by mouth daily.     [START ON 11/25/2022] apixaban (ELIQUIS) 5 MG TABS tablet Take 1 tablet (5 mg total) by mouth 2 (two) times daily. 60 tablet 1   APIXABAN (ELIQUIS) VTE STARTER PACK (10MG  AND 5MG ) Take as directed on package: start with two-5mg  tablets twice daily for 7 days. On day 8, switch to one-5mg  tablet twice daily. 1 each 0   atenolol (TENORMIN) 50 MG tablet Take 50 mg by mouth daily.     Cholecalciferol (VITAMIN D3) 125 MCG (5000 UT) CAPS Take 5,000 Units by mouth daily.     hydrochlorothiazide (HYDRODIURIL) 25 MG tablet Take 25 mg by mouth daily.     levothyroxine (SYNTHROID) 100 MCG tablet Take 100 mcg by mouth daily before breakfast.     lidocaine-prilocaine (EMLA) cream Apply to affected area once 30 g 6   Multiple Vitamins-Minerals (MULTIVITAMIN WITH MINERALS) tablet Take 1 tablet by  mouth daily.     omeprazole (PRILOSEC) 20 MG capsule Take 20 mg by mouth daily.     ondansetron (ZOFRAN) 8 MG tablet Take 1 tablet (8 mg total) by mouth every 8 (eight) hours as needed for nausea or vomiting. Start on the third day after chemotherapy. 30 tablet 1   prochlorperazine (COMPAZINE) 10 MG tablet Take 1 tablet (10 mg total) by mouth every 6 (six) hours as needed for nausea or vomiting. 30 tablet 1   zinc gluconate 50 MG tablet Take 50 mg by mouth daily.     potassium chloride SA (KLOR-CON M) 20 MEQ tablet Take 1 tablet (20 mEq total) by mouth 2 (two) times daily. 60 tablet 0   No current facility-administered medications for this visit.   Facility-Administered Medications Ordered in Other Visits  Medication Dose Route Frequency Provider Last Rate Last Admin   fluorouracil (ADRUCIL) 5,600 mg in sodium chloride 0.9 % 138  mL chemo infusion  2,400 mg/m2 (Order-Specific) Intravenous 1 day or 1 dose Rickard Patience, MD       fluorouracil (ADRUCIL) chemo injection 1,000 mg  400 mg/m2 (Order-Specific) Intravenous Once Rickard Patience, MD       leucovorin 936 mg in dextrose 5 % 250 mL infusion  400 mg/m2 (Order-Specific) Intravenous Once Rickard Patience, MD 148 mL/hr at 11/04/22 1148 936 mg at 11/04/22 1148   oxaliplatin (ELOXATIN) 175 mg in dextrose 5 % 500 mL chemo infusion  75 mg/m2 (Order-Specific) Intravenous Once Rickard Patience, MD 268 mL/hr at 11/04/22 1151 175 mg at 11/04/22 1151    Review of Systems  Constitutional:  Negative for appetite change, chills, fatigue and fever.  HENT:   Negative for hearing loss and voice change.   Eyes:  Negative for eye problems and icterus.  Respiratory:  Negative for chest tightness, cough and shortness of breath.   Cardiovascular:  Negative for chest pain and leg swelling.  Gastrointestinal:  Negative for abdominal distention, abdominal pain, nausea and vomiting.       Dysphagia/epigastric fullness  Endocrine: Negative for hot flashes.  Genitourinary:  Negative for  difficulty urinating, dysuria and frequency.   Musculoskeletal:  Negative for arthralgias.  Skin:  Negative for itching and rash.  Neurological:  Negative for light-headedness and numbness.  Hematological:  Negative for adenopathy. Does not bruise/bleed easily.  Psychiatric/Behavioral:  Negative for confusion.      PHYSICAL EXAMINATION: ECOG PERFORMANCE STATUS: 0 - Asymptomatic  Vitals:   11/04/22 0930  BP: 126/79  Pulse: 74  Temp: (!) 97.4 F (36.3 C)  SpO2: 99%   Filed Weights   11/04/22 0930  Weight: 244 lb 1.6 oz (110.7 kg)    Physical Exam Constitutional:      General: He is not in acute distress.    Appearance: He is obese. He is not diaphoretic.  HENT:     Head: Normocephalic and atraumatic.     Nose: Nose normal.     Mouth/Throat:     Pharynx: No oropharyngeal exudate.  Eyes:     General: No scleral icterus.    Pupils: Pupils are equal, round, and reactive to light.  Cardiovascular:     Rate and Rhythm: Normal rate and regular rhythm.     Heart sounds: No murmur heard. Pulmonary:     Effort: Pulmonary effort is normal. No respiratory distress.     Breath sounds: No rales.  Chest:     Chest wall: No tenderness.  Abdominal:     General: There is no distension.     Palpations: Abdomen is soft.     Tenderness: There is no abdominal tenderness.  Musculoskeletal:        General: Normal range of motion.     Cervical back: Normal range of motion and neck supple.  Skin:    General: Skin is warm and dry.     Findings: No erythema.  Neurological:     Mental Status: He is alert and oriented to person, place, and time.     Cranial Nerves: No cranial nerve deficit.     Motor: No abnormal muscle tone.     Coordination: Coordination normal.  Psychiatric:        Mood and Affect: Affect normal.      LABORATORY DATA:  I have reviewed the data as listed    Latest Ref Rng & Units 11/04/2022    9:26 AM 10/28/2022    9:29 AM 10/21/2022    8:41  AM  CBC  WBC 4.0 -  10.5 K/uL 3.9  6.7  6.6   Hemoglobin 13.0 - 17.0 g/dL 93.8  18.2  99.3   Hematocrit 39.0 - 52.0 % 37.6  38.4  39.9   Platelets 150 - 400 K/uL 309  332  308       Latest Ref Rng & Units 11/04/2022    9:26 AM 10/28/2022    9:29 AM 10/21/2022    8:41 AM  CMP  Glucose 70 - 99 mg/dL 716  967  893   BUN 8 - 23 mg/dL 11  13  10    Creatinine 0.61 - 1.24 mg/dL 8.10  1.75  1.02   Sodium 135 - 145 mmol/L 138  133  139   Potassium 3.5 - 5.1 mmol/L 3.2  2.7  3.0   Chloride 98 - 111 mmol/L 105  99  104   CO2 22 - 32 mmol/L 26  25  27    Calcium 8.9 - 10.3 mg/dL 9.0  8.6  9.2   Total Protein 6.5 - 8.1 g/dL 7.3  7.4  7.6   Total Bilirubin 0.3 - 1.2 mg/dL 0.5  0.6  0.6   Alkaline Phos 38 - 126 U/L 48  45  53   AST 15 - 41 U/L 29  31  35   ALT 0 - 44 U/L 29  33  38      RADIOGRAPHIC STUDIES: I have personally reviewed the radiological images as listed and agreed with the findings in the report. US Venous Img Upper Uni Right(DVT)  Result Date: 10/28/2022 CLINICAL DATA:  Right neck swelling. Pain superior to a Port-A-Cath. EXAM: RIGHT UPPER EXTREMITY VENOUS DOPPLER ULTRASOUND TECHNIQUE: Gray-scale sonography with graded compression, as well as color Doppler and duplex ultrasound were performed to evaluate the upper extremity deep venous system from the level of the subclavian vein and including the jugular, axillary, basilic, radial, ulnar and upper cephalic vein. Spectral Doppler was utilized to evaluate flow at rest and with distal augmentation maneuvers. COMPARISON:  None Available. FINDINGS: Contralateral Subclavian Vein: Respiratory phasicity is normal and symmetric with the symptomatic side. No evidence of thrombus. Normal compressibility. Internal Jugular Vein: Occlusive thrombus.  Noncompressible. Subclavian Vein: No evidence of thrombus. Normal compressibility, respiratory phasicity and response to augmentation. Axillary Vein: No evidence of thrombus. Normal compressibility, respiratory phasicity and  response to augmentation. Cephalic Vein: No evidence of thrombus. Normal compressibility, respiratory phasicity and response to augmentation. Basilic Vein: No evidence of thrombus. Normal compressibility, respiratory phasicity and response to augmentation. Brachial Veins: No evidence of thrombus. Normal compressibility, respiratory phasicity and response to augmentation. Radial Veins: No evidence of thrombus. Normal compressibility, respiratory phasicity and response to augmentation. Ulnar Veins: No evidence of thrombus. Normal compressibility, respiratory phasicity and response to augmentation. Venous Reflux:  None visualized. Other Findings:  None visualized. IMPRESSION: 1. Occlusive deep venous thrombosis within the visualized right internal jugular vein. 2. No deep venous thrombosis of the right upper extremity. Electronically Signed   By: Amie Portland M.D.   On: 10/28/2022 15:39   PERIPHERAL VASCULAR CATHETERIZATION  Result Date: 10/17/2022 See surgical note for result.  CT LIVER MASS BIOPSY  Result Date: 10/10/2022 INDICATION: History of esophageal cancer with hypermetabolic liver mass on PET. EXAM: CT-GUIDED LIVER MASS BIOPSY COMPARISON:  CT CAP, 08/26/2022. PET-CT, 09/05/2022. MR, 09/20/2022. MEDICATIONS: 10 mL lidocaine 1% ANESTHESIA/SEDATION: Moderate (conscious) sedation was employed during this procedure. A total of Versed 2 mg and Fentanyl 100 mcg was administered  intravenously. Moderate Sedation Time: 10 minutes. The patient's level of consciousness and vital signs were monitored continuously by radiology nursing throughout the procedure under my direct supervision. CONTRAST:  None. COMPLICATIONS: None immediate. PROCEDURE: RADIATION DOSE REDUCTION: This exam was performed according to the departmental dose-optimization program which includes automated exposure control, adjustment of the mA and/or kV according to patient size and/or use of iterative reconstruction technique. Informed consent  was obtained from the the patient and/or patient's representative following an explanation of the procedure, risks, benefits and alternatives. A time out was performed prior to the initiation of the procedure. The patient was positioned supine on the CT table and a limited CT was performed for procedural planning demonstrating superior RIGHT hepatic lobe mass, difficult to perceive on procedural CT. The procedure was planned. The operative site was prepped and draped in the usual sterile fashion. Appropriate trajectory was confirmed with a 22 gauge spinal needle after the adjacent tissues were anesthetized with 1% Lidocaine with epinephrine. Under intermittent CT guidance, a 17 gauge coaxial needle was advanced into the peripheral aspect of the mass. Appropriate positioning was confirmed and 4 samples were obtained with an 18 gauge core needle biopsy device. Gelfoam slurry injection was performed for hemostasis. The co-axial needle was removed and hemostasis was achieved with manual compression. A limited postprocedural CT was negative for hemorrhage or additional complication. A dressing was placed. The patient tolerated the procedure well without immediate postprocedural complication. IMPRESSION: 1. Challenging biopsy with superiorly-located RIGHT hepatic lobe mass near the liver dome, and difficult to perceive on procedural CT. 2. Successful CT guided core needle biopsy of liver mass. Roanna Banning, MD [facility] Radiology Electronically Signed   By: Roanna Banning M.D.   On: 10/10/2022 10:03

## 2022-11-04 NOTE — Assessment & Plan Note (Signed)
Continue follow up with Rad Onc. Patient gets palliative radiation.  

## 2022-11-04 NOTE — Assessment & Plan Note (Signed)
Discussed with patient today.

## 2022-11-04 NOTE — Assessment & Plan Note (Addendum)
EGD findings, pathology reports, CT, MRI, PET scan results were reviewed with patient Stage IV esophageal adenocarcinoma.  I recommend systematic palliative chemotherapy with FOLFOX [Oxaliplatin 75mg /m2 given that he will be on palliative RT and may have increased toxicity] .  Labs are reviewed and discussed with patient. Proceed with cycle 2 FOLFOX   Check [employer] NGS

## 2022-11-04 NOTE — Assessment & Plan Note (Signed)
Follow up with nutritionist.  

## 2022-11-04 NOTE — Assessment & Plan Note (Signed)
Chemotherapy plan as listed above 

## 2022-11-05 ENCOUNTER — Other Ambulatory Visit: Payer: Self-pay

## 2022-11-05 ENCOUNTER — Inpatient Hospital Stay: Payer: No Typology Code available for payment source

## 2022-11-05 ENCOUNTER — Encounter: Payer: Self-pay | Admitting: Oncology

## 2022-11-05 ENCOUNTER — Ambulatory Visit
Admission: RE | Admit: 2022-11-05 | Discharge: 2022-11-05 | Disposition: A | Discharge status: Home / Self Care | Payer: No Typology Code available for payment source | Source: Ambulatory Visit | Attending: Radiation Oncology | Admitting: Radiation Oncology

## 2022-11-05 DIAGNOSIS — M9903 Segmental and somatic dysfunction of lumbar region: Secondary | ICD-10-CM | POA: Diagnosis not present

## 2022-11-05 DIAGNOSIS — M9901 Segmental and somatic dysfunction of cervical region: Secondary | ICD-10-CM | POA: Diagnosis not present

## 2022-11-05 DIAGNOSIS — M9902 Segmental and somatic dysfunction of thoracic region: Secondary | ICD-10-CM | POA: Diagnosis not present

## 2022-11-05 DIAGNOSIS — M62830 Muscle spasm of back: Secondary | ICD-10-CM | POA: Diagnosis not present

## 2022-11-05 DIAGNOSIS — Z510 Encounter for antineoplastic radiation therapy: Secondary | ICD-10-CM | POA: Diagnosis not present

## 2022-11-05 LAB — RAD ONC ARIA SESSION SUMMARY
Course Elapsed Days: 15
Plan Fractions Treated to Date: 12
Plan Prescribed Dose Per Fraction: 1.8 Gy
Plan Total Fractions Prescribed: 30
Plan Total Prescribed Dose: 54 Gy
Reference Point Dosage Given to Date: 21.6 Gy
Reference Point Session Dosage Given: 1.8 Gy
Session Number: 12

## 2022-11-05 NOTE — Progress Notes (Signed)
Nutrition Assessment   Reason for Assessment:   Referral for esophageal cancer.   ASSESSMENT:  67 year old male with stage IV esophageal cancer.  Past medical history of HTN.  Patient on palliative chemotherapy of folfox and radiation.   Met with patient in clinic.  Patient reports that he is swallowing foods better after radiation treatments.  Reports that appetite is pretty good, eating less/smaller portions than before.  Able to swallow meats and breads without difficulty currently.  Usually has cereal and fruit for breakfast. Recently had steak and 2 eggs with toast for breakfast without difficulty.  Eating soups.  Has been having issues with sinus drainage. Reports vomiting this am after drinking protein drink but feels it was related to sinus drainage.     Medications: compazine, potassium chloride, zofran, MVI, prilosec, vit D   Labs: K 3.2, glucose 107   Anthropometrics:   Height: 69 inches Weight: 244 lb 1.6 oz  08/28/22 265 lb BMI: 36  8% weight loss in the last 2 1/2 months, significant Patient reports trying to loose weight initially but then unable to eat with dysphagia  Estimated Energy Needs  Kcals: 2400-2750 Protein: 120-138 g Fluid: 2400-2750 ml   NUTRITION DIAGNOSIS: Inadequate oral intake related to cancer and cancer related treatment side effects as evidenced by 8% weight loss in the last 2 1/2 months and decreased intake   INTERVENTION:  Encouraged protein food at each meal Encouraged oral nutrition supplement. If continues to loose weight would encourage 350 calorie shake or higher Antiemetics as needed    MONITORING, EVALUATION, GOAL: weight trends, intake   Next Visit: Wednesday, April 24 after radiation  Oluwatimilehin Balfour B. Freida Busman, RD, LDN Registered Dietitian [phone]

## 2022-11-06 ENCOUNTER — Ambulatory Visit: Payer: No Typology Code available for payment source

## 2022-11-06 ENCOUNTER — Ambulatory Visit
Admission: RE | Admit: 2022-11-06 | Discharge: 2022-11-06 | Disposition: A | Discharge status: Home / Self Care | Payer: No Typology Code available for payment source | Source: Ambulatory Visit | Attending: Radiation Oncology | Admitting: Radiation Oncology

## 2022-11-06 ENCOUNTER — Inpatient Hospital Stay: Payer: No Typology Code available for payment source

## 2022-11-06 ENCOUNTER — Other Ambulatory Visit: Payer: Self-pay

## 2022-11-06 VITALS — BP 123/80 | HR 86 | Resp 18

## 2022-11-06 DIAGNOSIS — C159 Malignant neoplasm of esophagus, unspecified: Secondary | ICD-10-CM

## 2022-11-06 DIAGNOSIS — Z510 Encounter for antineoplastic radiation therapy: Secondary | ICD-10-CM | POA: Diagnosis not present

## 2022-11-06 LAB — RAD ONC ARIA SESSION SUMMARY
Course Elapsed Days: 16
Plan Fractions Treated to Date: 13
Plan Prescribed Dose Per Fraction: 1.8 Gy
Plan Total Fractions Prescribed: 30
Plan Total Prescribed Dose: 54 Gy
Reference Point Dosage Given to Date: 23.4 Gy
Reference Point Session Dosage Given: 1.8 Gy
Session Number: 13

## 2022-11-06 MED ORDER — SODIUM CHLORIDE 0.9% FLUSH
10.0000 mL | INTRAVENOUS | Status: DC | PRN
  Filled 2022-11-06: qty 10

## 2022-11-06 MED ORDER — HEPARIN SOD (PORK) LOCK FLUSH 100 UNIT/ML IV SOLN
500.0000 [IU] | Freq: Once | INTRAVENOUS | Status: DC | PRN
  Filled 2022-11-06: qty 5

## 2022-11-07 ENCOUNTER — Other Ambulatory Visit: Payer: Self-pay

## 2022-11-07 ENCOUNTER — Encounter: Payer: Self-pay | Admitting: Oncology

## 2022-11-07 ENCOUNTER — Ambulatory Visit
Admission: RE | Admit: 2022-11-07 | Discharge: 2022-11-07 | Disposition: A | Discharge status: Home / Self Care | Payer: No Typology Code available for payment source | Source: Ambulatory Visit | Attending: Radiation Oncology | Admitting: Radiation Oncology

## 2022-11-07 ENCOUNTER — Ambulatory Visit: Payer: No Typology Code available for payment source

## 2022-11-07 DIAGNOSIS — Z510 Encounter for antineoplastic radiation therapy: Secondary | ICD-10-CM | POA: Diagnosis not present

## 2022-11-07 LAB — RAD ONC ARIA SESSION SUMMARY
Course Elapsed Days: 17
Plan Fractions Treated to Date: 14
Plan Prescribed Dose Per Fraction: 1.8 Gy
Plan Total Fractions Prescribed: 30
Plan Total Prescribed Dose: 54 Gy
Reference Point Dosage Given to Date: 25.2 Gy
Reference Point Session Dosage Given: 1.8 Gy
Session Number: 14

## 2022-11-08 ENCOUNTER — Other Ambulatory Visit: Payer: Self-pay

## 2022-11-08 ENCOUNTER — Ambulatory Visit
Admission: RE | Admit: 2022-11-08 | Discharge: 2022-11-08 | Disposition: A | Discharge status: Home / Self Care | Payer: No Typology Code available for payment source | Source: Ambulatory Visit | Attending: Radiation Oncology | Admitting: Radiation Oncology

## 2022-11-08 DIAGNOSIS — Z510 Encounter for antineoplastic radiation therapy: Secondary | ICD-10-CM | POA: Diagnosis not present

## 2022-11-08 LAB — RAD ONC ARIA SESSION SUMMARY
Course Elapsed Days: 18
Plan Fractions Treated to Date: 15
Plan Prescribed Dose Per Fraction: 1.8 Gy
Plan Total Fractions Prescribed: 30
Plan Total Prescribed Dose: 54 Gy
Reference Point Dosage Given to Date: 27 Gy
Reference Point Session Dosage Given: 1.8 Gy
Session Number: 15

## 2022-11-11 ENCOUNTER — Ambulatory Visit
Admission: RE | Admit: 2022-11-11 | Discharge: 2022-11-11 | Disposition: A | Discharge status: Home / Self Care | Payer: No Typology Code available for payment source | Source: Ambulatory Visit | Attending: Radiation Oncology | Admitting: Radiation Oncology

## 2022-11-11 ENCOUNTER — Other Ambulatory Visit: Payer: Self-pay

## 2022-11-11 DIAGNOSIS — Z510 Encounter for antineoplastic radiation therapy: Secondary | ICD-10-CM | POA: Diagnosis not present

## 2022-11-11 LAB — RAD ONC ARIA SESSION SUMMARY
Course Elapsed Days: 21
Plan Fractions Treated to Date: 16
Plan Prescribed Dose Per Fraction: 1.8 Gy
Plan Total Fractions Prescribed: 30
Plan Total Prescribed Dose: 54 Gy
Reference Point Dosage Given to Date: 28.8 Gy
Reference Point Session Dosage Given: 1.8 Gy
Session Number: 16

## 2022-11-12 ENCOUNTER — Other Ambulatory Visit: Payer: Self-pay

## 2022-11-12 ENCOUNTER — Ambulatory Visit
Admission: RE | Admit: 2022-11-12 | Discharge: 2022-11-12 | Disposition: A | Discharge status: Home / Self Care | Payer: No Typology Code available for payment source | Source: Ambulatory Visit | Attending: Radiation Oncology | Admitting: Radiation Oncology

## 2022-11-12 DIAGNOSIS — Z510 Encounter for antineoplastic radiation therapy: Secondary | ICD-10-CM | POA: Diagnosis not present

## 2022-11-12 LAB — RAD ONC ARIA SESSION SUMMARY
Course Elapsed Days: 22
Plan Fractions Treated to Date: 17
Plan Prescribed Dose Per Fraction: 1.8 Gy
Plan Total Fractions Prescribed: 30
Plan Total Prescribed Dose: 54 Gy
Reference Point Dosage Given to Date: 30.6 Gy
Reference Point Session Dosage Given: 1.5183 Gy
Session Number: 17

## 2022-11-13 ENCOUNTER — Other Ambulatory Visit: Payer: Self-pay

## 2022-11-13 ENCOUNTER — Ambulatory Visit
Admission: RE | Admit: 2022-11-13 | Discharge: 2022-11-13 | Disposition: A | Discharge status: Home / Self Care | Payer: No Typology Code available for payment source | Source: Ambulatory Visit | Attending: Radiation Oncology | Admitting: Radiation Oncology

## 2022-11-13 DIAGNOSIS — Z510 Encounter for antineoplastic radiation therapy: Secondary | ICD-10-CM | POA: Diagnosis not present

## 2022-11-13 LAB — RAD ONC ARIA SESSION SUMMARY
Course Elapsed Days: 23
Plan Fractions Treated to Date: 18
Plan Prescribed Dose Per Fraction: 1.8 Gy
Plan Total Fractions Prescribed: 30
Plan Total Prescribed Dose: 54 Gy
Reference Point Dosage Given to Date: 32.4 Gy
Reference Point Session Dosage Given: 1.8 Gy
Session Number: 18

## 2022-11-14 ENCOUNTER — Ambulatory Visit
Admission: RE | Admit: 2022-11-14 | Discharge: 2022-11-14 | Disposition: A | Discharge status: Home / Self Care | Payer: No Typology Code available for payment source | Source: Ambulatory Visit | Attending: Radiation Oncology | Admitting: Radiation Oncology

## 2022-11-14 ENCOUNTER — Other Ambulatory Visit: Payer: Self-pay

## 2022-11-14 DIAGNOSIS — Z510 Encounter for antineoplastic radiation therapy: Secondary | ICD-10-CM | POA: Diagnosis not present

## 2022-11-14 LAB — RAD ONC ARIA SESSION SUMMARY
Course Elapsed Days: 24
Plan Fractions Treated to Date: 19
Plan Prescribed Dose Per Fraction: 1.8 Gy
Plan Total Fractions Prescribed: 30
Plan Total Prescribed Dose: 54 Gy
Reference Point Dosage Given to Date: 34.2 Gy
Reference Point Session Dosage Given: 1.8 Gy
Session Number: 19

## 2022-11-15 ENCOUNTER — Ambulatory Visit
Admission: RE | Admit: 2022-11-15 | Discharge: 2022-11-15 | Disposition: A | Discharge status: Home / Self Care | Payer: No Typology Code available for payment source | Source: Ambulatory Visit | Attending: Radiation Oncology | Admitting: Radiation Oncology

## 2022-11-15 ENCOUNTER — Other Ambulatory Visit: Payer: Self-pay

## 2022-11-15 DIAGNOSIS — Z510 Encounter for antineoplastic radiation therapy: Secondary | ICD-10-CM | POA: Diagnosis not present

## 2022-11-15 LAB — RAD ONC ARIA SESSION SUMMARY
Course Elapsed Days: 25
Plan Fractions Treated to Date: 20
Plan Prescribed Dose Per Fraction: 1.8 Gy
Plan Total Fractions Prescribed: 30
Plan Total Prescribed Dose: 54 Gy
Reference Point Dosage Given to Date: 36 Gy
Reference Point Session Dosage Given: 1.8 Gy
Session Number: 20

## 2022-11-15 MED FILL — Dexamethasone Sodium Phosphate Inj 100 MG/10ML: INTRAMUSCULAR | Qty: 1 | Status: AC

## 2022-11-18 ENCOUNTER — Other Ambulatory Visit: Payer: Self-pay

## 2022-11-18 ENCOUNTER — Inpatient Hospital Stay (HOSPITAL_BASED_OUTPATIENT_CLINIC_OR_DEPARTMENT_OTHER): Payer: No Typology Code available for payment source | Admitting: Oncology

## 2022-11-18 ENCOUNTER — Inpatient Hospital Stay: Payer: No Typology Code available for payment source

## 2022-11-18 ENCOUNTER — Encounter: Payer: Self-pay | Admitting: Oncology

## 2022-11-18 ENCOUNTER — Ambulatory Visit
Admission: RE | Admit: 2022-11-18 | Discharge: 2022-11-18 | Disposition: A | Discharge status: Home / Self Care | Payer: No Typology Code available for payment source | Source: Ambulatory Visit | Attending: Radiation Oncology | Admitting: Radiation Oncology

## 2022-11-18 VITALS — BP 127/84 | HR 77 | Temp 96.9°F | Resp 18 | Wt 240.1 lb

## 2022-11-18 DIAGNOSIS — I82621 Acute embolism and thrombosis of deep veins of right upper extremity: Secondary | ICD-10-CM

## 2022-11-18 DIAGNOSIS — C159 Malignant neoplasm of esophagus, unspecified: Secondary | ICD-10-CM

## 2022-11-18 DIAGNOSIS — R1319 Other dysphagia: Secondary | ICD-10-CM

## 2022-11-18 DIAGNOSIS — D702 Other drug-induced agranulocytosis: Secondary | ICD-10-CM

## 2022-11-18 DIAGNOSIS — E876 Hypokalemia: Secondary | ICD-10-CM

## 2022-11-18 DIAGNOSIS — Z5111 Encounter for antineoplastic chemotherapy: Secondary | ICD-10-CM | POA: Diagnosis not present

## 2022-11-18 DIAGNOSIS — Z510 Encounter for antineoplastic radiation therapy: Secondary | ICD-10-CM | POA: Diagnosis not present

## 2022-11-18 LAB — RAD ONC ARIA SESSION SUMMARY
Course Elapsed Days: 28
Plan Fractions Treated to Date: 21
Plan Prescribed Dose Per Fraction: 1.8 Gy
Plan Total Fractions Prescribed: 30
Plan Total Prescribed Dose: 54 Gy
Reference Point Dosage Given to Date: 37.8 Gy
Reference Point Session Dosage Given: 1.8 Gy
Session Number: 21

## 2022-11-18 LAB — CMP (CANCER CENTER ONLY)
ALT: 26 U/L (ref 0–44)
AST: 25 U/L (ref 15–41)
Albumin: 3.5 g/dL (ref 3.5–5.0)
Alkaline Phosphatase: 52 U/L (ref 38–126)
Anion gap: 9 (ref 5–15)
BUN: 16 mg/dL (ref 8–23)
CO2: 26 mmol/L (ref 22–32)
Calcium: 8.9 mg/dL (ref 8.9–10.3)
Chloride: 104 mmol/L (ref 98–111)
Creatinine: 1.05 mg/dL (ref 0.61–1.24)
GFR, Estimated: >60 mL/min (ref >60)
Glucose, Bld: 112 mg/dL — ABNORMAL HIGH (ref 70–99)
Potassium: 2.9 mmol/L — ABNORMAL LOW (ref 3.5–5.1)
Sodium: 139 mmol/L (ref 135–145)
Total Bilirubin: 0.6 mg/dL (ref 0.3–1.2)
Total Protein: 7.1 g/dL (ref 6.5–8.1)

## 2022-11-18 LAB — CBC WITH DIFFERENTIAL (CANCER CENTER ONLY)
Abs Immature Granulocytes: 0 10*3/uL (ref 0.00–0.07)
Basophils Absolute: 0 10*3/uL (ref 0.0–0.1)
Basophils Relative: 1 %
Eosinophils Absolute: 0.1 10*3/uL (ref 0.0–0.5)
Eosinophils Relative: 3 %
HCT: 37.3 % — ABNORMAL LOW (ref 39.0–52.0)
Hemoglobin: 11.9 g/dL — ABNORMAL LOW (ref 13.0–17.0)
Immature Granulocytes: 0 %
Lymphocytes Relative: 33 %
Lymphs Abs: 0.6 10*3/uL — ABNORMAL LOW (ref 0.7–4.0)
MCH: 26.3 pg (ref 26.0–34.0)
MCHC: 31.9 g/dL (ref 30.0–36.0)
MCV: 82.5 fL (ref 80.0–100.0)
Monocytes Absolute: 0.4 10*3/uL (ref 0.1–1.0)
Monocytes Relative: 25 %
Neutro Abs: 0.6 10*3/uL — ABNORMAL LOW (ref 1.7–7.7)
Neutrophils Relative %: 38 %
Platelet Count: 160 10*3/uL (ref 150–400)
RBC: 4.52 MIL/uL (ref 4.22–5.81)
RDW: 15.6 % — ABNORMAL HIGH (ref 11.5–15.5)
WBC Count: 1.7 10*3/uL — ABNORMAL LOW (ref 4.0–10.5)
nRBC: 0 % (ref 0.0–0.2)

## 2022-11-18 MED ORDER — CHLORHEXIDINE GLUCONATE 0.12 % MT SOLN: 15.0000 mL | Freq: Two times a day (BID) | OROMUCOSAL | 1 refills | Status: DC

## 2022-11-18 MED ORDER — SODIUM CHLORIDE 0.9 % IV SOLN
Freq: Once | INTRAVENOUS | Status: AC
  Filled 2022-11-18: qty 250

## 2022-11-18 MED ORDER — POTASSIUM CHLORIDE 20 MEQ/100ML IV SOLN
20.0000 meq | Freq: Once | INTRAVENOUS | Status: AC
  Administered 2022-11-18: 20 meq via INTRAVENOUS

## 2022-11-18 MED ORDER — HEPARIN SOD (PORK) LOCK FLUSH 100 UNIT/ML IV SOLN
INTRAVENOUS | Status: AC
  Filled 2022-11-18: qty 5

## 2022-11-18 NOTE — Assessment & Plan Note (Signed)
continue potassium supplementation BID IV Potassium x 1 today.

## 2022-11-18 NOTE — Assessment & Plan Note (Signed)
Continue Eliquis 5mg BID 

## 2022-11-18 NOTE — Progress Notes (Signed)
Hematology/Oncology Progress note [phone] [phone]        REFERRING PROVIDER: Rickard Patience, MD  CHIEF COMPLAINTS/PURPOSE OF CONSULTATION:  Esophageal adenocarcinoma  ASSESSMENT & PLAN:   Cancer Staging  Adenocarcinoma of esophagus Staging form: Esophagus - Adenocarcinoma, AJCC 8th Edition - Clinical stage from 08/28/2022: Stage IVB (cT3, cN1, cM1) - Signed by Rickard Patience, MD on 10/04/2022   Adenocarcinoma of esophagus [facility]) EGD findings, pathology reports, CT, MRI, PET scan results were reviewed with patient Stage IV esophageal adenocarcinoma.  I recommend systematic palliative chemotherapy with FOLFOX [Oxaliplatin 75mg /m2 while on RT] .  Labs are reviewed and discussed with patient. Hold   FOLFOX    Drug-induced neutropenia Hold chemotherapy.  Recommend neutropenia precaution.  Peridex mouth wash Rx sent to pharmacy.   Hypokalemia continue potassium supplementation BID IV Potassium x 1 today.   Dysphagia Continue follow up with Rad Onc. Patient gets palliative radiation.   Deep venous thrombosis Continue Eliquis 5mg  BID    Orders Placed This Encounter  Procedures   CBC with Differential (Cancer Center Only)    Standing Status:   Future    Standing Expiration Date:   11/25/2023   CMP (Cancer Center only)    Standing Status:   Future    Standing Expiration Date:   11/25/2023   Follow-up  1 week  lab MD FOLFOX D3 pump dc  All questions were answered. The patient knows to call the clinic with any problems, questions or concerns.  Rickard Patience, MD, PhD [facility] Hematology Oncology 11/18/2022   HISTORY OF PRESENTING ILLNESS:  Jonathan [name] 67 y.o. male presents to establish care for esophageal adenocarcinoma I have reviewed his chart and materials related to his cancer extensively and collaborated history with the patient. Summary of oncologic history is as follows: Oncology History  Adenocarcinoma of esophagus  08/28/2022  Initial Diagnosis   Adenocarcinoma of esophagus   -Patient has noticed worsening of "food stuck/fullness" sensation since November 2023.  Patient had a barium swallow study which commented on marked mucosal irregularity in the distal esophagus with Broaddus base mural filling defect highly suspicious for malignancy.  Patient establish care with gastroenterology. -08/23/2022, EGD showed gastritis and partially obstructing malignant esophageal tumor in the lower third of the esophagus. Esophagus mass biopsy showed adenocarcinoma.  PD-L1 TPS 65%, HER2 negative.  [facility] NGS showed KRASG12V, CDKN2A, ARID1A, TP53, TMB 5.3, MSI stable.  Stomach biopsy showed gastric mucosa with no specific histology abnormality.  No significant intestinal metaplastic, dysplastic, granular atrophy or increased inflammation.     08/28/2022 Imaging   CT chest abdomen pelvis with contrast showed 1. Distal esophageal primary with gastrohepatic ligament nodal metastasis. 2. 2 right-sided pulmonary nodules, the largest of which measures 5 mm and is new since 2015. Pulmonary metastasis not be excluded. 3. Anterior right lower lobe volume loss and minimal soft tissue density, favoring atelectasis or scar. Recommend attention on follow-up. 4. Hepatic steatosis 5. Cholelithiasis 6. Left nephrolithiasis 7. Coronary artery atherosclerosis. Aortic Atherosclerosis   08/28/2022 Cancer Staging   Staging form: Esophagus - Adenocarcinoma, AJCC 8th Edition - Clinical stage from 08/28/2022: Stage IVB (cT3, cN1, cM1) - Signed by Rickard Patience, MD on 10/04/2022 Stage prefix: Initial diagnosis   09/06/2022 Imaging   PET scan showed 1. Esophageal primary with gastrohepatic ligament nodal metastasis,as on CT. 2. Focus of hypermetabolism which is favored to registered to the posterior hepatic dome, in the region of subtle heterogeneity on prior diagnostic CT. Suboptimally evaluated secondary to  underlying steatosis. Recommend further evaluation with  pre and post contrast abdominal MRI to confirm probable metastasis. 3. Incidental findings, including: Left nephrolithiasis.Cholelithiasis. Coronary artery atherosclerosis. Aortic Atherosclerosis    09/14/2022 - 09/14/2022 Chemotherapy   Patient is on Treatment Plan : ESOPHAGUS Carboplatin + Paclitaxel Weekly X 6 Weeks with XRT     09/23/2022 Imaging   MRI abdomen with and without contrast showed 1. Mildly T2 hyperintense segment VII hepatic lesion measuring 2.7 cm with imaging characteristics compatible with metastatic disease. 2. Tiny focus of delayed enhancement in the inferior right lobe of the liver segment VI measuring 8 mm with ill-defined increased T2 signal and subtle corresponding reduced diffusivity, also suspicious for metastatic disease. 3. Partially visualized distal esophageal wall thickening compatible with the patient's known primary neoplasm. 4. Similar size of the 11 mm gastrohepatic ligament lymph node mildly metabolic on prior PET-CT and compatible with local nodal disease involvement. 5. 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 Recommend follow up pre and post-contrast MRI/MRCP in 1 year. 6. Diffuse hepatic steatosis.   10/10/2022 Procedure   LIVER MASS; CT-GUIDED BIOPSY:  - MODERATE TO POORLY DIFFERENTIATED ADENOCARCINOMA MORPHOLOGICALLY  CONSISTENT WITH METASTASIS FROM PATIENT'S KNOWN ESOPHAGEAL  ADENOCARCINOMA.    10/17/2022 Procedure   Medi port placed by Dr. Wyn Quaker   10/21/2022 -  Chemotherapy   Patient is on Treatment Plan : ESOPHAGEAL ADENOCARCINOMA FOLFOX q14d x 6 cycles     Patient presents to establish care.  He is not taking PPI. He has intentionally lost some weight. Family history positive for father and paternal uncle with prostate cancer and sister with breast cancer. Denies any routine alcohol use  Right interval jugular vein occlusive DVT, started on Eliquis starter  package on 10/28/2022, swelling of neck has improved.    INTERVAL HISTORY Jonathan [name] is a 67 y.o. male who has above history reviewed by me today presents for follow up visit for Stage IV esophageal adenocarcinoma.   Nasal congestion is better after taking allergy medicaiton. Cough is better.  He tolerated chemotherapy and radiation. Dysphagias has improved.  Denies neuropathy  MEDICAL HISTORY:  Past Medical History:  Diagnosis Date   Arthritis    Complication of anesthesia    OCCURRED ONCE YEARS AGO 1981   Esophageal mass    Hypertension    Hypothyroidism    PONV (postoperative nausea and vomiting)     SURGICAL HISTORY: Past Surgical History:  Procedure Laterality Date   COLONOSCOPY     ESOPHAGOGASTRODUODENOSCOPY N/A 08/23/2022   Procedure: ESOPHAGOGASTRODUODENOSCOPY (EGD);  Surgeon: Jaynie Collins, DO;  Location: [facility] ENDOSCOPY;  Service: Gastroenterology;  Laterality: N/A;   EUS N/A 09/12/2022   Procedure: FULL UPPER ENDOSCOPIC ULTRASOUND (EUS) RADIAL;  Surgeon: Bearl Mulberry, MD;  Location: [facility] ENDOSCOPY;  Service: Gastroenterology;  Laterality: N/A;   FINGER SURGERY     PORTA CATH INSERTION N/A 10/17/2022   Procedure: PORTA CATH INSERTION;  Surgeon: Annice Needy, MD;  Location: [facility] INVASIVE CV LAB;  Service: Cardiovascular;  Laterality: N/A;   TENDON REPAIR IN LEFT KNEE      SOCIAL HISTORY: Social History   Socioeconomic History   Marital status: Single    Spouse name: Not on file   Number of children: Not on file   Years of education: Not on file   Highest education level: Not on file  Occupational History   Not on file  Tobacco Use  Smoking status: Never   Smokeless tobacco: Never  Vaping Use   Vaping Use: Never used  Substance and Sexual Activity   Alcohol use: Yes    Comment: OCCASIONALLY   Drug use: Never   Sexual activity: Not on file  Other Topics Concern   Not on file  Social History Narrative   Not on file   Social  Determinants of Health   Financial Resource Strain: Not on file  Food Insecurity: No Food Insecurity (08/28/2022)   Hunger Vital Sign    Worried About Running Out of Food in the Last Year: Never true    Ran Out of Food in the Last Year: Never true  Transportation Needs: No Transportation Needs (08/28/2022)   PRAPARE - Administrator, Civil Service (Medical): No    Lack of Transportation (Non-Medical): No  Physical Activity: Not on file  Stress: Not on file  Social Connections: Not on file  Intimate Partner Violence: Not At Risk (08/28/2022)   Humiliation, Afraid, Rape, and Kick questionnaire    Fear of Current or Ex-Partner: No    Emotionally Abused: No    Physically Abused: No    Sexually Abused: No    FAMILY HISTORY: Family History  Problem Relation Age of Onset   Heart attack Mother    Prostate cancer Father    Breast cancer Sister    Prostate cancer Paternal Uncle     ALLERGIES:  has No Known Allergies.  MEDICATIONS:  Current Outpatient Medications  Medication Sig Dispense Refill   acetaminophen (TYLENOL) 650 MG CR tablet Take 1,300 mg by mouth every 8 (eight) hours as needed for pain.     amLODipine (NORVASC) 5 MG tablet Take 5 mg by mouth daily.     [START ON 11/25/2022] apixaban (ELIQUIS) 5 MG TABS tablet Take 1 tablet (5 mg total) by mouth 2 (two) times daily. 60 tablet 1   APIXABAN (ELIQUIS) VTE STARTER PACK (10MG  AND 5MG ) Take as directed on package: start with two-5mg  tablets twice daily for 7 days. On day 8, switch to one-5mg  tablet twice daily. 1 each 0   atenolol (TENORMIN) 50 MG tablet Take 50 mg by mouth daily.     Cholecalciferol (VITAMIN D3) 125 MCG (5000 UT) CAPS Take 5,000 Units by mouth daily.     hydrochlorothiazide (HYDRODIURIL) 25 MG tablet Take 25 mg by mouth daily.     levothyroxine (SYNTHROID) 100 MCG tablet Take 100 mcg by mouth daily before breakfast.     lidocaine-prilocaine (EMLA) cream Apply to affected area once 30 g 6   Multiple  Vitamins-Minerals (MULTIVITAMIN WITH MINERALS) tablet Take 1 tablet by mouth daily.     omeprazole (PRILOSEC) 20 MG capsule Take 20 mg by mouth daily.     ondansetron (ZOFRAN) 8 MG tablet Take 1 tablet (8 mg total) by mouth every 8 (eight) hours as needed for nausea or vomiting. Start on the third day after chemotherapy. 30 tablet 1   potassium chloride SA (KLOR-CON M) 20 MEQ tablet Take 1 tablet (20 mEq total) by mouth 2 (two) times daily. 60 tablet 0   prochlorperazine (COMPAZINE) 10 MG tablet Take 1 tablet (10 mg total) by mouth every 6 (six) hours as needed for nausea or vomiting. 30 tablet 1   zinc gluconate 50 MG tablet Take 50 mg by mouth daily.     No current facility-administered medications for this visit.    Review of Systems  Constitutional:  Negative for appetite change, chills, fatigue  and fever.  HENT:   Negative for hearing loss and voice change.   Eyes:  Negative for eye problems and icterus.  Respiratory:  Negative for chest tightness, cough and shortness of breath.   Cardiovascular:  Negative for chest pain and leg swelling.  Gastrointestinal:  Negative for abdominal distention, abdominal pain, nausea and vomiting.       Dysphagia/epigastric fullness  Endocrine: Negative for hot flashes.  Genitourinary:  Negative for difficulty urinating, dysuria and frequency.   Musculoskeletal:  Negative for arthralgias.  Skin:  Negative for itching and rash.  Neurological:  Negative for light-headedness and numbness.  Hematological:  Negative for adenopathy. Does not bruise/bleed easily.  Psychiatric/Behavioral:  Negative for confusion.      PHYSICAL EXAMINATION: ECOG PERFORMANCE STATUS: 0 - Asymptomatic  Vitals:   11/18/22 0851  BP: 127/84  Pulse: 77  Resp: 18  Temp: (!) 96.9 F (36.1 C)  SpO2: 98%   Filed Weights   11/18/22 0851  Weight: 240 lb 1.6 oz (108.9 kg)    Physical Exam Constitutional:      General: He is not in acute distress.    Appearance: He is  obese. He is not diaphoretic.  HENT:     Head: Normocephalic and atraumatic.     Nose: Nose normal.     Mouth/Throat:     Pharynx: No oropharyngeal exudate.  Eyes:     General: No scleral icterus.    Pupils: Pupils are equal, round, and reactive to light.  Cardiovascular:     Rate and Rhythm: Normal rate and regular rhythm.     Heart sounds: No murmur heard. Pulmonary:     Effort: Pulmonary effort is normal. No respiratory distress.     Breath sounds: No rales.  Chest:     Chest wall: No tenderness.  Abdominal:     General: There is no distension.     Palpations: Abdomen is soft.     Tenderness: There is no abdominal tenderness.  Musculoskeletal:        General: Normal range of motion.     Cervical back: Normal range of motion and neck supple.  Skin:    General: Skin is warm and dry.     Findings: No erythema.  Neurological:     Mental Status: He is alert and oriented to person, place, and time.     Cranial Nerves: No cranial nerve deficit.     Motor: No abnormal muscle tone.     Coordination: Coordination normal.  Psychiatric:        Mood and Affect: Affect normal.      LABORATORY DATA:  I have reviewed the data as listed    Latest Ref Rng & Units 11/04/2022    9:26 AM 10/28/2022    9:29 AM 10/21/2022    8:41 AM  CBC  WBC 4.0 - 10.5 K/uL 3.9  6.7  6.6   Hemoglobin 13.0 - 17.0 g/dL 16.1  09.6  04.5   Hematocrit 39.0 - 52.0 % 37.6  38.4  39.9   Platelets 150 - 400 K/uL 309  332  308       Latest Ref Rng & Units 11/04/2022    9:26 AM 10/28/2022    9:29 AM 10/21/2022    8:41 AM  CMP  Glucose 70 - 99 mg/dL 409  811  914   BUN 8 - 23 mg/dL 11  13  10    Creatinine 0.61 - 1.24 mg/dL 7.82  9.56  2.13  Sodium 135 - 145 mmol/L 138  133  139   Potassium 3.5 - 5.1 mmol/L 3.2  2.7  3.0   Chloride 98 - 111 mmol/L 105  99  104   CO2 22 - 32 mmol/L Calcium 8.9 - 10.3 mg/dL 9.0  8.6  9.2   Total Protein 6.5 - 8.1 g/dL 7.3  7.4  7.6   Total Bilirubin 0.3 - 1.2 mg/dL  0.5  0.6  0.6   Alkaline Phos 38 - 126 U/L 48  45  53   AST 15 - 41 U/L 29  31  35   ALT 0 - 44 U/L 29  33  38      RADIOGRAPHIC STUDIES: I have personally reviewed the radiological images as listed and agreed with the findings in the report. US Venous Img Upper Uni Right(DVT)  Result Date: 10/28/2022 CLINICAL DATA:  Right neck swelling. Pain superior to a Port-A-Cath. EXAM: RIGHT UPPER EXTREMITY VENOUS DOPPLER ULTRASOUND TECHNIQUE: Gray-scale sonography with graded compression, as well as color Doppler and duplex ultrasound were performed to evaluate the upper extremity deep venous system from the level of the subclavian vein and including the jugular, axillary, basilic, radial, ulnar and upper cephalic vein. Spectral Doppler was utilized to evaluate flow at rest and with distal augmentation maneuvers. COMPARISON:  None Available. FINDINGS: Contralateral Subclavian Vein: Respiratory phasicity is normal and symmetric with the symptomatic side. No evidence of thrombus. Normal compressibility. Internal Jugular Vein: Occlusive thrombus.  Noncompressible. Subclavian Vein: No evidence of thrombus. Normal compressibility, respiratory phasicity and response to augmentation. Axillary Vein: No evidence of thrombus. Normal compressibility, respiratory phasicity and response to augmentation. Cephalic Vein: No evidence of thrombus. Normal compressibility, respiratory phasicity and response to augmentation. Basilic Vein: No evidence of thrombus. Normal compressibility, respiratory phasicity and response to augmentation. Brachial Veins: No evidence of thrombus. Normal compressibility, respiratory phasicity and response to augmentation. Radial Veins: No evidence of thrombus. Normal compressibility, respiratory phasicity and response to augmentation. Ulnar Veins: No evidence of thrombus. Normal compressibility, respiratory phasicity and response to augmentation. Venous Reflux:  None visualized. Other Findings:  None  visualized. IMPRESSION: 1. Occlusive deep venous thrombosis within the visualized right internal jugular vein. 2. No deep venous thrombosis of the right upper extremity. Electronically Signed   By: Amie Portland M.D.   On: 10/28/2022 15:39

## 2022-11-18 NOTE — Assessment & Plan Note (Signed)
Hold chemotherapy.  Recommend neutropenia precaution.  Peridex mouth wash Rx sent to pharmacy.

## 2022-11-18 NOTE — Assessment & Plan Note (Signed)
Continue follow up with Rad Onc. Patient gets palliative radiation.  

## 2022-11-18 NOTE — Assessment & Plan Note (Addendum)
EGD findings, pathology reports, CT, MRI, PET scan results were reviewed with patient Stage IV esophageal adenocarcinoma.  I recommend systematic palliative chemotherapy with FOLFOX [Oxaliplatin /m2 while on RT] .  Labs are reviewed and discussed with patient. Hold   FOLFOX

## 2022-11-19 ENCOUNTER — Ambulatory Visit
Admission: RE | Admit: 2022-11-19 | Discharge: 2022-11-19 | Disposition: A | Discharge status: Home / Self Care | Payer: No Typology Code available for payment source | Source: Ambulatory Visit | Attending: Radiation Oncology | Admitting: Radiation Oncology

## 2022-11-19 ENCOUNTER — Other Ambulatory Visit: Payer: Self-pay

## 2022-11-19 DIAGNOSIS — Z510 Encounter for antineoplastic radiation therapy: Secondary | ICD-10-CM | POA: Diagnosis not present

## 2022-11-19 LAB — RAD ONC ARIA SESSION SUMMARY
Course Elapsed Days: 29
Plan Fractions Treated to Date: 22
Plan Prescribed Dose Per Fraction: 1.8 Gy
Plan Total Fractions Prescribed: 30
Plan Total Prescribed Dose: 54 Gy
Reference Point Dosage Given to Date: 39.6 Gy
Reference Point Session Dosage Given: 1.8 Gy
Session Number: 22

## 2022-11-20 ENCOUNTER — Other Ambulatory Visit: Payer: Self-pay

## 2022-11-20 ENCOUNTER — Inpatient Hospital Stay: Payer: No Typology Code available for payment source

## 2022-11-20 ENCOUNTER — Ambulatory Visit
Admission: RE | Admit: 2022-11-20 | Discharge: 2022-11-20 | Disposition: A | Discharge status: Home / Self Care | Payer: No Typology Code available for payment source | Source: Ambulatory Visit | Attending: Radiation Oncology | Admitting: Radiation Oncology

## 2022-11-20 ENCOUNTER — Ambulatory Visit: Payer: No Typology Code available for payment source

## 2022-11-20 DIAGNOSIS — Z510 Encounter for antineoplastic radiation therapy: Secondary | ICD-10-CM | POA: Diagnosis not present

## 2022-11-20 LAB — RAD ONC ARIA SESSION SUMMARY
Course Elapsed Days: 30
Plan Fractions Treated to Date: 23
Plan Prescribed Dose Per Fraction: 1.8 Gy
Plan Total Fractions Prescribed: 30
Plan Total Prescribed Dose: 54 Gy
Reference Point Dosage Given to Date: 41.4 Gy
Reference Point Session Dosage Given: 1.8 Gy
Session Number: 23

## 2022-11-20 NOTE — Progress Notes (Signed)
Nutrition Follow-up:  Patient with stage IV esophageal cancer.  Patient on palliative chemotherapy of folfox and radiation.   Met with patient today.  Patient reports that his appetite is so-so.  Eating about 50% of usual intake.  Randomly has episodes of nausea, vomiting (mucous, sinus drainage).  Did not have chemotherapy this week due to neutropenia.  Has been drinking protein shake.  Ate some chicken pie yesterday but got sick afterwards.  Taking nausea medication.  Ate ice cream as well yesterday.  Likes chocolate milk and cereal.  Food is going down without difficulty    Medications: reviewed  Labs: reviewed  Anthropometrics:   Weight 240 lb 1.6 oz on 4/22 244 lb 1.6 oz on 4/9 265 lb on 1/31   NUTRITION DIAGNOSIS: Inadequate oral intake ongoing    INTERVENTION:  Encouraged 350 calorie shake Encouraged protein food at every meal Continue antiemetics     MONITORING, EVALUATION, GOAL: weight trends, intake   NEXT VISIT: Thursday, May 16 phone call  Shandi Godfrey B. Freida Busman, RD, LDN Registered Dietitian [phone]

## 2022-11-21 ENCOUNTER — Ambulatory Visit: Payer: No Typology Code available for payment source

## 2022-11-21 ENCOUNTER — Other Ambulatory Visit: Payer: Self-pay

## 2022-11-21 ENCOUNTER — Ambulatory Visit
Admission: RE | Admit: 2022-11-21 | Discharge: 2022-11-21 | Disposition: A | Discharge status: Home / Self Care | Payer: No Typology Code available for payment source | Source: Ambulatory Visit | Attending: Radiation Oncology | Admitting: Radiation Oncology

## 2022-11-21 DIAGNOSIS — Z510 Encounter for antineoplastic radiation therapy: Secondary | ICD-10-CM | POA: Diagnosis not present

## 2022-11-21 DIAGNOSIS — C159 Malignant neoplasm of esophagus, unspecified: Secondary | ICD-10-CM | POA: Diagnosis not present

## 2022-11-21 LAB — RAD ONC ARIA SESSION SUMMARY
Course Elapsed Days: 31
Plan Fractions Treated to Date: 24
Plan Prescribed Dose Per Fraction: 1.8 Gy
Plan Total Fractions Prescribed: 30
Plan Total Prescribed Dose: 54 Gy
Reference Point Dosage Given to Date: 43.2 Gy
Reference Point Session Dosage Given: 1.8 Gy
Session Number: 24

## 2022-11-22 ENCOUNTER — Other Ambulatory Visit: Payer: Self-pay

## 2022-11-22 ENCOUNTER — Ambulatory Visit
Admission: RE | Admit: 2022-11-22 | Discharge: 2022-11-22 | Disposition: A | Discharge status: Home / Self Care | Payer: No Typology Code available for payment source | Source: Ambulatory Visit | Attending: Radiation Oncology | Admitting: Radiation Oncology

## 2022-11-22 DIAGNOSIS — Z510 Encounter for antineoplastic radiation therapy: Secondary | ICD-10-CM | POA: Diagnosis not present

## 2022-11-22 LAB — RAD ONC ARIA SESSION SUMMARY
Course Elapsed Days: 32
Plan Fractions Treated to Date: 25
Plan Prescribed Dose Per Fraction: 1.8 Gy
Plan Total Fractions Prescribed: 30
Plan Total Prescribed Dose: 54 Gy
Reference Point Dosage Given to Date: 45 Gy
Reference Point Session Dosage Given: 1.8 Gy
Session Number: 25

## 2022-11-22 MED FILL — Dexamethasone Sodium Phosphate Inj 100 MG/10ML: INTRAMUSCULAR | Qty: 1 | Status: AC

## 2022-11-25 ENCOUNTER — Inpatient Hospital Stay: Payer: No Typology Code available for payment source

## 2022-11-25 ENCOUNTER — Other Ambulatory Visit: Payer: Self-pay

## 2022-11-25 ENCOUNTER — Ambulatory Visit
Admission: RE | Admit: 2022-11-25 | Discharge: 2022-11-25 | Disposition: A | Discharge status: Home / Self Care | Payer: No Typology Code available for payment source | Source: Ambulatory Visit | Attending: Radiation Oncology | Admitting: Radiation Oncology

## 2022-11-25 ENCOUNTER — Encounter: Payer: Self-pay | Admitting: Oncology

## 2022-11-25 ENCOUNTER — Inpatient Hospital Stay (HOSPITAL_BASED_OUTPATIENT_CLINIC_OR_DEPARTMENT_OTHER): Payer: No Typology Code available for payment source | Admitting: Oncology

## 2022-11-25 VITALS — BP 128/82 | HR 82 | Temp 96.7°F | Resp 18 | Wt 240.0 lb

## 2022-11-25 DIAGNOSIS — C159 Malignant neoplasm of esophagus, unspecified: Secondary | ICD-10-CM

## 2022-11-25 DIAGNOSIS — I82621 Acute embolism and thrombosis of deep veins of right upper extremity: Secondary | ICD-10-CM | POA: Diagnosis not present

## 2022-11-25 DIAGNOSIS — D701 Agranulocytosis secondary to cancer chemotherapy: Secondary | ICD-10-CM

## 2022-11-25 DIAGNOSIS — Z5111 Encounter for antineoplastic chemotherapy: Secondary | ICD-10-CM

## 2022-11-25 DIAGNOSIS — E876 Hypokalemia: Secondary | ICD-10-CM | POA: Diagnosis not present

## 2022-11-25 DIAGNOSIS — T451X5A Adverse effect of antineoplastic and immunosuppressive drugs, initial encounter: Secondary | ICD-10-CM | POA: Insufficient documentation

## 2022-11-25 DIAGNOSIS — R1319 Other dysphagia: Secondary | ICD-10-CM

## 2022-11-25 DIAGNOSIS — Z510 Encounter for antineoplastic radiation therapy: Secondary | ICD-10-CM | POA: Diagnosis not present

## 2022-11-25 LAB — CMP (CANCER CENTER ONLY)
ALT: 39 U/L (ref 0–44)
AST: 43 U/L — ABNORMAL HIGH (ref 15–41)
Albumin: 3.4 g/dL — ABNORMAL LOW (ref 3.5–5.0)
Alkaline Phosphatase: 52 U/L (ref 38–126)
Anion gap: 8 (ref 5–15)
BUN: 13 mg/dL (ref 8–23)
CO2: 25 mmol/L (ref 22–32)
Calcium: 8.9 mg/dL (ref 8.9–10.3)
Chloride: 102 mmol/L (ref 98–111)
Creatinine: 1.05 mg/dL (ref 0.61–1.24)
GFR, Estimated: >60 mL/min (ref >60)
Glucose, Bld: 149 mg/dL — ABNORMAL HIGH (ref 70–99)
Potassium: 3.2 mmol/L — ABNORMAL LOW (ref 3.5–5.1)
Sodium: 135 mmol/L (ref 135–145)
Total Bilirubin: 0.4 mg/dL (ref 0.3–1.2)
Total Protein: 7.1 g/dL (ref 6.5–8.1)

## 2022-11-25 LAB — CBC WITH DIFFERENTIAL (CANCER CENTER ONLY)
Abs Immature Granulocytes: 0.07 10*3/uL (ref 0.00–0.07)
Basophils Absolute: 0.1 10*3/uL (ref 0.0–0.1)
Basophils Relative: 2 %
Eosinophils Absolute: 0.1 10*3/uL (ref 0.0–0.5)
Eosinophils Relative: 2 %
HCT: 38.6 % — ABNORMAL LOW (ref 39.0–52.0)
Hemoglobin: 12.1 g/dL — ABNORMAL LOW (ref 13.0–17.0)
Immature Granulocytes: 2 %
Lymphocytes Relative: 22 %
Lymphs Abs: 0.8 10*3/uL (ref 0.7–4.0)
MCH: 26.2 pg (ref 26.0–34.0)
MCHC: 31.3 g/dL (ref 30.0–36.0)
MCV: 83.7 fL (ref 80.0–100.0)
Monocytes Absolute: 0.5 10*3/uL (ref 0.1–1.0)
Monocytes Relative: 13 %
Neutro Abs: 2.3 10*3/uL (ref 1.7–7.7)
Neutrophils Relative %: 59 %
Platelet Count: 295 10*3/uL (ref 150–400)
RBC: 4.61 MIL/uL (ref 4.22–5.81)
RDW: 16.7 % — ABNORMAL HIGH (ref 11.5–15.5)
WBC Count: 3.8 10*3/uL — ABNORMAL LOW (ref 4.0–10.5)
nRBC: 0 % (ref 0.0–0.2)

## 2022-11-25 LAB — RAD ONC ARIA SESSION SUMMARY
Course Elapsed Days: 35
Plan Fractions Treated to Date: 26
Plan Prescribed Dose Per Fraction: 1.8 Gy
Plan Total Fractions Prescribed: 30
Plan Total Prescribed Dose: 54 Gy
Reference Point Dosage Given to Date: 46.8 Gy
Reference Point Session Dosage Given: 1.8 Gy
Session Number: 26

## 2022-11-25 MED ORDER — SODIUM CHLORIDE 0.9 % IV SOLN
10.0000 mg | Freq: Once | INTRAVENOUS | Status: AC
  Administered 2022-11-25: 10 mg via INTRAVENOUS
  Filled 2022-11-25: qty 1
  Filled 2022-11-25: qty 10

## 2022-11-25 MED ORDER — PALONOSETRON HCL INJECTION 0.25 MG/5ML
0.2500 mg | Freq: Once | INTRAVENOUS | Status: AC
  Administered 2022-11-25: 0.25 mg via INTRAVENOUS
  Filled 2022-11-25: qty 5

## 2022-11-25 MED ORDER — ALTEPLASE 2 MG IJ SOLR
2.0000 mg | Freq: Once | INTRAMUSCULAR | Status: AC | PRN
  Administered 2022-11-25: 2 mg
  Filled 2022-11-25: qty 2

## 2022-11-25 MED ORDER — SODIUM CHLORIDE 0.9 % IV SOLN
2400.0000 mg/m2 | INTRAVENOUS | Status: DC
  Administered 2022-11-25: 5500 mg via INTRAVENOUS
  Filled 2022-11-25: qty 110

## 2022-11-25 MED ORDER — DEXTROSE 5 % IV SOLN
Freq: Once | INTRAVENOUS | Status: AC
  Filled 2022-11-25: qty 250

## 2022-11-25 MED ORDER — LEUCOVORIN CALCIUM INJECTION 350 MG
400.0000 mg/m2 | Freq: Once | INTRAVENOUS | Status: AC
  Administered 2022-11-25: 920 mg via INTRAVENOUS
  Filled 2022-11-25: qty 46

## 2022-11-25 MED ORDER — FLUOROURACIL CHEMO INJECTION 2.5 GM/50ML
400.0000 mg/m2 | Freq: Once | INTRAVENOUS | Status: AC
  Administered 2022-11-25: 1000 mg via INTRAVENOUS
  Filled 2022-11-25: qty 20

## 2022-11-25 MED ORDER — OXALIPLATIN CHEMO INJECTION 100 MG/20ML
75.0000 mg/m2 | Freq: Once | INTRAVENOUS | Status: AC
  Administered 2022-11-25: 175 mg via INTRAVENOUS
  Filled 2022-11-25: qty 35

## 2022-11-25 NOTE — Assessment & Plan Note (Signed)
Continue Eliquis 5mg BID 

## 2022-11-25 NOTE — Assessment & Plan Note (Signed)
Continue follow up with Rad Onc. Patient gets palliative radiation.  

## 2022-11-25 NOTE — Patient Instructions (Signed)
[facility]  Discharge Instructions: Thank you for choosing [facility] to provide your oncology and hematology care.  If you have a lab appointment with the Cancer Center, please go directly to the Cancer Center and check in at the registration area.  Wear comfortable clothing and clothing appropriate for easy access to any Portacath or PICC line.   We strive to give you quality time with your provider. You may need to reschedule your appointment if you arrive late (15 or more minutes).  Arriving late affects you and other patients whose appointments are after yours.  Also, if you miss three or more appointments without notifying the office, you may be dismissed from the clinic at the provider's discretion.      For prescription refill requests, have your pharmacy contact our office and allow 72 hours for refills to be completed.    Today you received the following chemotherapy and/or immunotherapy agents Oxaliplatin, Leucovorin and Adrucil       To help prevent nausea and vomiting after your treatment, we encourage you to take your nausea medication as directed.  BELOW ARE SYMPTOMS THAT SHOULD BE REPORTED IMMEDIATELY: *FEVER GREATER THAN 100.4 F (38 C) OR HIGHER *CHILLS OR SWEATING *NAUSEA AND VOMITING THAT IS NOT CONTROLLED WITH YOUR NAUSEA MEDICATION *UNUSUAL SHORTNESS OF BREATH *UNUSUAL BRUISING OR BLEEDING *URINARY PROBLEMS (pain or burning when urinating, or frequent urination) *BOWEL PROBLEMS (unusual diarrhea, constipation, pain near the anus) TENDERNESS IN MOUTH AND THROAT WITH OR WITHOUT PRESENCE OF ULCERS (sore throat, sores in mouth, or a toothache) UNUSUAL RASH, SWELLING OR PAIN  UNUSUAL VAGINAL DISCHARGE OR ITCHING   Items with * indicate a potential emergency and should be followed up as soon as possible or go to the Emergency Department if any problems should occur.  Please show the CHEMOTHERAPY ALERT CARD or  IMMUNOTHERAPY ALERT CARD at check-in to the Emergency Department and triage nurse.  Should you have questions after your visit or need to cancel or reschedule your appointment, please contact [facility]  [phone] and follow the prompts.  Office hours are 8:00 a.m. to 4:30 p.m. Monday - Friday. Please note that voicemails left after 4:00 p.m. may not be returned until the following business day.  We are closed weekends and major holidays. You have access to a nurse at all times for urgent questions. Please call the main number to the clinic [phone] and follow the prompts.  For any non-urgent questions, you may also contact your provider using MyChart. We now offer e-Visits for anyone 18 and older to request care online for non-urgent symptoms. For details visit [email or web address].   Also download the MyChart app! Go to the app store, search "MyChart", open the app, select [facility], and log in with your MyChart username and password.    

## 2022-11-25 NOTE — Progress Notes (Signed)
Hematology/Oncology Progress note [phone] [phone]        REFERRING PROVIDER: Rickard Patience, MD  CHIEF COMPLAINTS/PURPOSE OF CONSULTATION:  Stage IV Esophageal adenocarcinoma  ASSESSMENT & PLAN:   Cancer Staging  Adenocarcinoma of esophagus [facility]) Staging form: Esophagus - Adenocarcinoma, AJCC 8th Edition - Clinical stage from 08/28/2022: Stage IVB (cT3, cN1, cM1) - Signed by Rickard Patience, MD on 10/04/2022   Adenocarcinoma of esophagus (HCC) Stage IV esophageal adenocarcinoma, liver metastatic disease I recommend systematic palliative chemotherapy with FOLFOX [Oxaliplatin 75mg /m2 while on RT] .  Labs are reviewed and discussed with patient. Proceed with FOLFOX    Hypokalemia continue potassium supplementation BID   Encounter for antineoplastic chemotherapy Chemotherapy plan as listed above.   Dysphagia Continue follow up with Rad Onc. Patient gets palliative radiation.   Deep venous thrombosis (HCC) Continue Eliquis 5mg  BID   Chemotherapy induced neutropenia (HCC) ANC has improved. monitor   Orders Placed This Encounter  Procedures   CBC with Differential (Cancer Center Only)    Standing Status:   Future    Standing Expiration Date:   12/23/2023   CMP (Cancer Center only)    Standing Status:   Future    Standing Expiration Date:   12/23/2023   CBC with Differential (Cancer Center Only)    Standing Status:   Future    Standing Expiration Date:   01/06/2024   CMP (Cancer Center only)    Standing Status:   Future    Standing Expiration Date:   01/06/2024   Follow-up  2 weeks  lab MD FOLFOX D3 pump dc  All questions were answered. The patient knows to call the clinic with any problems, questions or concerns.  Rickard Patience, MD, PhD [facility] Hematology Oncology 11/25/2022   HISTORY OF PRESENTING ILLNESS:  TAHJE BORAWSKI 67 y.o. male presents to establish care for esophageal adenocarcinoma I have reviewed his chart and materials related  to his cancer extensively and collaborated history with the patient. Summary of oncologic history is as follows: Oncology History  Adenocarcinoma of esophagus (HCC)  08/28/2022 Initial Diagnosis   Adenocarcinoma of esophagus   -Patient has noticed worsening of "food stuck/fullness" sensation since November 2023.  Patient had a barium swallow study which commented on marked mucosal irregularity in the distal esophagus with Broaddus base mural filling defect highly suspicious for malignancy.  Patient establish care with gastroenterology. -08/23/2022, EGD showed gastritis and partially obstructing malignant esophageal tumor in the lower third of the esophagus. Esophagus mass biopsy showed adenocarcinoma.  PD-L1 TPS 65%, HER2 negative.  [facility] NGS showed KRASG12V, CDKN2A, ARID1A, TP53, TMB 5.3, MSI stable.  Stomach biopsy showed gastric mucosa with no specific histology abnormality.  No significant intestinal metaplastic, dysplastic, granular atrophy or increased inflammation.     08/28/2022 Imaging   CT chest abdomen pelvis with contrast showed 1. Distal esophageal primary with gastrohepatic ligament nodal metastasis. 2. 2 right-sided pulmonary nodules, the largest of which measures 5 mm and is new since 2015. Pulmonary metastasis not be excluded. 3. Anterior right lower lobe volume loss and minimal soft tissue density, favoring atelectasis or scar. Recommend attention on follow-up. 4. Hepatic steatosis 5. Cholelithiasis 6. Left nephrolithiasis 7. Coronary artery atherosclerosis. Aortic Atherosclerosis   08/28/2022 Cancer Staging   Staging form: Esophagus - Adenocarcinoma, AJCC 8th Edition - Clinical stage from 08/28/2022: Stage IVB (cT3, cN1, cM1) - Signed by Rickard Patience, MD on 10/04/2022 Stage prefix: Initial diagnosis   09/06/2022 Imaging   PET scan showed 1.  Esophageal primary with gastrohepatic ligament nodal metastasis,as on CT. 2. Focus of hypermetabolism which is favored to registered to  the posterior hepatic dome, in the region of subtle heterogeneity on prior diagnostic CT. Suboptimally evaluated secondary to underlying steatosis. Recommend further evaluation with pre and post contrast abdominal MRI to confirm probable metastasis. 3. Incidental findings, including: Left nephrolithiasis.Cholelithiasis. Coronary artery atherosclerosis. Aortic Atherosclerosis    09/14/2022 - 09/14/2022 Chemotherapy   Patient is on Treatment Plan : ESOPHAGUS Carboplatin + Paclitaxel Weekly X 6 Weeks with XRT     09/23/2022 Imaging   MRI abdomen with and without contrast showed 1. Mildly T2 hyperintense segment VII hepatic lesion measuring 2.7 cm with imaging characteristics compatible with metastatic disease. 2. Tiny focus of delayed enhancement in the inferior right lobe of the liver segment VI measuring 8 mm with ill-defined increased T2 signal and subtle corresponding reduced diffusivity, also suspicious for metastatic disease. 3. Partially visualized distal esophageal wall thickening compatible with the patient's known primary neoplasm. 4. Similar size of the 11 mm gastrohepatic ligament lymph node mildly metabolic on prior PET-CT and compatible with local nodal disease involvement. 5. 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 Recommend follow up pre and post-contrast MRI/MRCP in 1 year. 6. Diffuse hepatic steatosis.   10/10/2022 Procedure   LIVER MASS; CT-GUIDED BIOPSY:  - MODERATE TO POORLY DIFFERENTIATED ADENOCARCINOMA MORPHOLOGICALLY  CONSISTENT WITH METASTASIS FROM PATIENT'S KNOWN ESOPHAGEAL  ADENOCARCINOMA.    10/17/2022 Procedure   Medi port placed by Dr. Wyn Quaker   10/21/2022 -  Chemotherapy   Patient is on Treatment Plan : ESOPHAGEAL ADENOCARCINOMA FOLFOX q14d x 6 cycles     Patient presents to establish care.  He is not taking PPI. He has intentionally lost some weight. Family history positive for  father and paternal uncle with prostate cancer and sister with breast cancer. Denies any routine alcohol use  Right interval jugular vein occlusive DVT, started on Eliquis starter package on 10/28/2022, swelling of neck has improved.    INTERVAL HISTORY LINCOLN GINLEY is a 67 y.o. male who has above history reviewed by me today presents for follow up visit for Stage IV esophageal adenocarcinoma.  He tolerated chemotherapy and radiation. Dysphagias has improved.  Denies neuropathy/ no fever chills, nausea vomiting diarrhea. Weight is stable.   MEDICAL HISTORY:  Past Medical History:  Diagnosis Date   Arthritis    Complication of anesthesia    OCCURRED ONCE YEARS AGO 1981   Esophageal mass    Hypertension    Hypothyroidism    PONV (postoperative nausea and vomiting)     SURGICAL HISTORY: Past Surgical History:  Procedure Laterality Date   COLONOSCOPY     ESOPHAGOGASTRODUODENOSCOPY N/A 08/23/2022   Procedure: ESOPHAGOGASTRODUODENOSCOPY (EGD);  Surgeon: Jaynie Collins, DO;  Location: [facility] ENDOSCOPY;  Service: Gastroenterology;  Laterality: N/A;   EUS N/A 09/12/2022   Procedure: FULL UPPER ENDOSCOPIC ULTRASOUND (EUS) RADIAL;  Surgeon: Bearl Mulberry, MD;  Location: [facility] ENDOSCOPY;  Service: Gastroenterology;  Laterality: N/A;   FINGER SURGERY     PORTA CATH INSERTION N/A 10/17/2022   Procedure: PORTA CATH INSERTION;  Surgeon: Annice Needy, MD;  Location: [facility] INVASIVE CV LAB;  Service: Cardiovascular;  Laterality: N/A;   TENDON REPAIR IN LEFT KNEE      SOCIAL HISTORY: Social History   Socioeconomic History   Marital status: Single    Spouse name: Not on file  Number of children: Not on file   Years of education: Not on file   Highest education level: Not on file  Occupational History   Not on file  Tobacco Use   Smoking status: Never   Smokeless tobacco: Never  Vaping Use   Vaping Use: Never used  Substance and Sexual Activity   Alcohol use: Yes     Comment: OCCASIONALLY   Drug use: Never   Sexual activity: Not on file  Other Topics Concern   Not on file  Social History Narrative   Not on file   Social Determinants of Health   Financial Resource Strain: Not on file  Food Insecurity: No Food Insecurity (08/28/2022)   Hunger Vital Sign    Worried About Running Out of Food in the Last Year: Never true    Ran Out of Food in the Last Year: Never true  Transportation Needs: No Transportation Needs (08/28/2022)   PRAPARE - Administrator, Civil Service (Medical): No    Lack of Transportation (Non-Medical): No  Physical Activity: Not on file  Stress: Not on file  Social Connections: Not on file  Intimate Partner Violence: Not At Risk (08/28/2022)   Humiliation, Afraid, Rape, and Kick questionnaire    Fear of Current or Ex-Partner: No    Emotionally Abused: No    Physically Abused: No    Sexually Abused: No    FAMILY HISTORY: Family History  Problem Relation Age of Onset   Heart attack Mother    Prostate cancer Father    Breast cancer Sister    Prostate cancer Paternal Uncle     ALLERGIES:  has No Known Allergies.  MEDICATIONS:  Current Outpatient Medications  Medication Sig Dispense Refill   acetaminophen (TYLENOL) 650 MG CR tablet Take 1,300 mg by mouth every 8 (eight) hours as needed for pain.     amLODipine (NORVASC) 5 MG tablet Take 5 mg by mouth daily.     apixaban (ELIQUIS) 5 MG TABS tablet Take 1 tablet (5 mg total) by mouth 2 (two) times daily. 60 tablet 1   APIXABAN (ELIQUIS) VTE STARTER PACK (10MG  AND 5MG ) Take as directed on package: start with two-5mg  tablets twice daily for 7 days. On day 8, switch to one-5mg  tablet twice daily. 1 each 0   atenolol (TENORMIN) 50 MG tablet Take 50 mg by mouth daily.     chlorhexidine (PERIDEX) 0.12 % solution Use as directed 15 mLs in the mouth or throat 2 (two) times daily. 473 mL 1   Cholecalciferol (VITAMIN D3) 125 MCG (5000 UT) CAPS Take 5,000 Units by mouth  daily.     hydrochlorothiazide (HYDRODIURIL) 25 MG tablet Take 25 mg by mouth daily.     levothyroxine (SYNTHROID) 100 MCG tablet Take 100 mcg by mouth daily before breakfast.     lidocaine-prilocaine (EMLA) cream Apply to affected area once 30 g 6   Multiple Vitamins-Minerals (MULTIVITAMIN WITH MINERALS) tablet Take 1 tablet by mouth daily.     omeprazole (PRILOSEC) 20 MG capsule Take 20 mg by mouth daily.     ondansetron (ZOFRAN) 8 MG tablet Take 1 tablet (8 mg total) by mouth every 8 (eight) hours as needed for nausea or vomiting. Start on the third day after chemotherapy. 30 tablet 1   potassium chloride SA (KLOR-CON M) 20 MEQ tablet Take 1 tablet (20 mEq total) by mouth 2 (two) times daily. 60 tablet 0   prochlorperazine (COMPAZINE) 10 MG tablet Take 1 tablet (10  mg total) by mouth every 6 (six) hours as needed for nausea or vomiting. 30 tablet 1   zinc gluconate 50 MG tablet Take 50 mg by mouth daily.     No current facility-administered medications for this visit.   Facility-Administered Medications Ordered in Other Visits  Medication Dose Route Frequency Provider Last Rate Last Admin   fluorouracil (ADRUCIL) 5,500 mg in sodium chloride 0.9 % 140 mL chemo infusion  2,400 mg/m2 (Order-Specific) Intravenous 1 day or 1 dose Rickard Patience, MD       fluorouracil (ADRUCIL) chemo injection 1,000 mg  400 mg/m2 (Order-Specific) Intravenous Once Rickard Patience, MD       leucovorin 920 mg in dextrose 5 % 250 mL infusion  400 mg/m2 (Order-Specific) Intravenous Once Rickard Patience, MD 148 mL/hr at 11/25/22 1128 920 mg at 11/25/22 1128   oxaliplatin (ELOXATIN) 175 mg in dextrose 5 % 500 mL chemo infusion  75 mg/m2 (Order-Specific) Intravenous Once Rickard Patience, MD 268 mL/hr at 11/25/22 1130 175 mg at 11/25/22 1130    Review of Systems  Constitutional:  Negative for appetite change, chills, fatigue and fever.  HENT:   Negative for hearing loss and voice change.   Eyes:  Negative for eye problems and icterus.   Respiratory:  Negative for chest tightness, cough and shortness of breath.   Cardiovascular:  Negative for chest pain and leg swelling.  Gastrointestinal:  Negative for abdominal distention, abdominal pain, nausea and vomiting.       Dysphagia/epigastric fullness  Endocrine: Negative for hot flashes.  Genitourinary:  Negative for difficulty urinating, dysuria and frequency.   Musculoskeletal:  Negative for arthralgias.  Skin:  Negative for itching and rash.  Neurological:  Negative for light-headedness and numbness.  Hematological:  Negative for adenopathy. Does not bruise/bleed easily.  Psychiatric/Behavioral:  Negative for confusion.      PHYSICAL EXAMINATION: ECOG PERFORMANCE STATUS: 0 - Asymptomatic  Vitals:   11/25/22 0912  BP: 128/82  Pulse: 82  Resp: 18  Temp: (!) 96.7 F (35.9 C)  SpO2: 97%   Filed Weights   11/25/22 0912  Weight: 240 lb (108.9 kg)    Physical Exam Constitutional:      General: He is not in acute distress.    Appearance: He is obese. He is not diaphoretic.  HENT:     Head: Normocephalic and atraumatic.     Nose: Nose normal.     Mouth/Throat:     Pharynx: No oropharyngeal exudate.  Eyes:     General: No scleral icterus.    Pupils: Pupils are equal, round, and reactive to light.  Cardiovascular:     Rate and Rhythm: Normal rate and regular rhythm.     Heart sounds: No murmur heard. Pulmonary:     Effort: Pulmonary effort is normal. No respiratory distress.     Breath sounds: No rales.  Chest:     Chest wall: No tenderness.  Abdominal:     General: There is no distension.     Palpations: Abdomen is soft.     Tenderness: There is no abdominal tenderness.  Musculoskeletal:        General: Normal range of motion.     Cervical back: Normal range of motion and neck supple.  Skin:    General: Skin is warm and dry.     Findings: No erythema.  Neurological:     Mental Status: He is alert and oriented to person, place, and time.      Cranial Nerves: No  cranial nerve deficit.     Motor: No abnormal muscle tone.     Coordination: Coordination normal.  Psychiatric:        Mood and Affect: Affect normal.      LABORATORY DATA:  I have reviewed the data as listed    Latest Ref Rng & Units 11/25/2022    8:27 AM 11/18/2022    8:33 AM 11/04/2022    9:26 AM  CBC  WBC 4.0 - 10.5 K/uL 3.8  1.7  3.9   Hemoglobin 13.0 - 17.0 g/dL 16.1  09.6  04.5   Hematocrit 39.0 - 52.0 % 38.6  37.3  37.6   Platelets 150 - 400 K/uL 295  160  309       Latest Ref Rng & Units 11/25/2022    8:27 AM 11/18/2022    8:33 AM 11/04/2022    9:26 AM  CMP  Glucose 70 - 99 mg/dL 409  811  914   BUN 8 - 23 mg/dL 13  16  11    Creatinine 0.61 - 1.24 mg/dL 7.82  9.56  2.13   Sodium 135 - 145 mmol/L 135  139  138   Potassium 3.5 - 5.1 mmol/L 3.2  2.9  3.2   Chloride 98 - 111 mmol/L 102  104  105   CO2 22 - 32 mmol/L 25  26  26    Calcium 8.9 - 10.3 mg/dL 8.9  8.9  9.0   Total Protein 6.5 - 8.1 g/dL 7.1  7.1  7.3   Total Bilirubin 0.3 - 1.2 mg/dL 0.4  0.6  0.5   Alkaline Phos 38 - 126 U/L 52  52  48   AST 15 - 41 U/L 43  25  29   ALT 0 - 44 U/L 39  26  29      RADIOGRAPHIC STUDIES: I have personally reviewed the radiological images as listed and agreed with the findings in the report. US Venous Img Upper Uni Right(DVT)  Result Date: 10/28/2022 CLINICAL DATA:  Right neck swelling. Pain superior to a Port-A-Cath. EXAM: RIGHT UPPER EXTREMITY VENOUS DOPPLER ULTRASOUND TECHNIQUE: Gray-scale sonography with graded compression, as well as color Doppler and duplex ultrasound were performed to evaluate the upper extremity deep venous system from the level of the subclavian vein and including the jugular, axillary, basilic, radial, ulnar and upper cephalic vein. Spectral Doppler was utilized to evaluate flow at rest and with distal augmentation maneuvers. COMPARISON:  None Available. FINDINGS: Contralateral Subclavian Vein: Respiratory phasicity is normal and  symmetric with the symptomatic side. No evidence of thrombus. Normal compressibility. Internal Jugular Vein: Occlusive thrombus.  Noncompressible. Subclavian Vein: No evidence of thrombus. Normal compressibility, respiratory phasicity and response to augmentation. Axillary Vein: No evidence of thrombus. Normal compressibility, respiratory phasicity and response to augmentation. Cephalic Vein: No evidence of thrombus. Normal compressibility, respiratory phasicity and response to augmentation. Basilic Vein: No evidence of thrombus. Normal compressibility, respiratory phasicity and response to augmentation. Brachial Veins: No evidence of thrombus. Normal compressibility, respiratory phasicity and response to augmentation. Radial Veins: No evidence of thrombus. Normal compressibility, respiratory phasicity and response to augmentation. Ulnar Veins: No evidence of thrombus. Normal compressibility, respiratory phasicity and response to augmentation. Venous Reflux:  None visualized. Other Findings:  None visualized. IMPRESSION: 1. Occlusive deep venous thrombosis within the visualized right internal jugular vein. 2. No deep venous thrombosis of the right upper extremity. Electronically Signed   By: Amie Portland M.D.   On: 10/28/2022 15:39

## 2022-11-25 NOTE — Assessment & Plan Note (Signed)
Chemotherapy plan as listed above 

## 2022-11-25 NOTE — Assessment & Plan Note (Signed)
Stage IV esophageal adenocarcinoma, liver metastatic disease I recommend systematic palliative chemotherapy with FOLFOX [Oxaliplatin 75mg /m2 while on RT] .  Labs are reviewed and discussed with patient. Proceed with FOLFOX

## 2022-11-25 NOTE — Assessment & Plan Note (Signed)
ANC has improved. monitor

## 2022-11-25 NOTE — Progress Notes (Unsigned)
Port flushed, No blood return noted. Cathflo given at [phone]. MD aware. Peripheral labs drawn.

## 2022-11-25 NOTE — Assessment & Plan Note (Signed)
continue potassium supplementation BID

## 2022-11-26 ENCOUNTER — Other Ambulatory Visit: Payer: Self-pay

## 2022-11-26 ENCOUNTER — Ambulatory Visit
Admission: RE | Admit: 2022-11-26 | Discharge: 2022-11-26 | Disposition: A | Discharge status: Home / Self Care | Payer: No Typology Code available for payment source | Source: Ambulatory Visit | Attending: Radiation Oncology | Admitting: Radiation Oncology

## 2022-11-26 DIAGNOSIS — Z510 Encounter for antineoplastic radiation therapy: Secondary | ICD-10-CM | POA: Diagnosis not present

## 2022-11-26 LAB — RAD ONC ARIA SESSION SUMMARY
Course Elapsed Days: 36
Plan Fractions Treated to Date: 27
Plan Prescribed Dose Per Fraction: 1.8 Gy
Plan Total Fractions Prescribed: 30
Plan Total Prescribed Dose: 54 Gy
Reference Point Dosage Given to Date: 48.6 Gy
Reference Point Session Dosage Given: 1.8 Gy
Session Number: 27

## 2022-11-27 ENCOUNTER — Other Ambulatory Visit: Payer: Self-pay

## 2022-11-27 ENCOUNTER — Ambulatory Visit
Admission: RE | Admit: 2022-11-27 | Discharge: 2022-11-27 | Disposition: A | Discharge status: Home / Self Care | Payer: No Typology Code available for payment source | Source: Ambulatory Visit | Attending: Radiation Oncology | Admitting: Radiation Oncology

## 2022-11-27 ENCOUNTER — Inpatient Hospital Stay: Payer: No Typology Code available for payment source | Attending: Oncology

## 2022-11-27 VITALS — BP 109/75 | HR 88 | Resp 18

## 2022-11-27 DIAGNOSIS — I82C11 Acute embolism and thrombosis of right internal jugular vein: Secondary | ICD-10-CM | POA: Diagnosis not present

## 2022-11-27 DIAGNOSIS — Z8249 Family history of ischemic heart disease and other diseases of the circulatory system: Secondary | ICD-10-CM | POA: Diagnosis not present

## 2022-11-27 DIAGNOSIS — E876 Hypokalemia: Secondary | ICD-10-CM | POA: Diagnosis not present

## 2022-11-27 DIAGNOSIS — N200 Calculus of kidney: Secondary | ICD-10-CM | POA: Diagnosis not present

## 2022-11-27 DIAGNOSIS — Z8042 Family history of malignant neoplasm of prostate: Secondary | ICD-10-CM | POA: Insufficient documentation

## 2022-11-27 DIAGNOSIS — Z79899 Other long term (current) drug therapy: Secondary | ICD-10-CM | POA: Insufficient documentation

## 2022-11-27 DIAGNOSIS — C787 Secondary malignant neoplasm of liver and intrahepatic bile duct: Secondary | ICD-10-CM | POA: Insufficient documentation

## 2022-11-27 DIAGNOSIS — T451X5A Adverse effect of antineoplastic and immunosuppressive drugs, initial encounter: Secondary | ICD-10-CM | POA: Insufficient documentation

## 2022-11-27 DIAGNOSIS — C155 Malignant neoplasm of lower third of esophagus: Secondary | ICD-10-CM | POA: Diagnosis not present

## 2022-11-27 DIAGNOSIS — Z86718 Personal history of other venous thrombosis and embolism: Secondary | ICD-10-CM | POA: Diagnosis not present

## 2022-11-27 DIAGNOSIS — D702 Other drug-induced agranulocytosis: Secondary | ICD-10-CM | POA: Insufficient documentation

## 2022-11-27 DIAGNOSIS — R21 Rash and other nonspecific skin eruption: Secondary | ICD-10-CM | POA: Diagnosis not present

## 2022-11-27 DIAGNOSIS — Z79630 Long term (current) use of alkylating agent: Secondary | ICD-10-CM | POA: Insufficient documentation

## 2022-11-27 DIAGNOSIS — K8020 Calculus of gallbladder without cholecystitis without obstruction: Secondary | ICD-10-CM | POA: Insufficient documentation

## 2022-11-27 DIAGNOSIS — Z79631 Long term (current) use of antimetabolite agent: Secondary | ICD-10-CM | POA: Insufficient documentation

## 2022-11-27 DIAGNOSIS — Z7901 Long term (current) use of anticoagulants: Secondary | ICD-10-CM | POA: Insufficient documentation

## 2022-11-27 DIAGNOSIS — C159 Malignant neoplasm of esophagus, unspecified: Secondary | ICD-10-CM | POA: Diagnosis not present

## 2022-11-27 DIAGNOSIS — I2510 Atherosclerotic heart disease of native coronary artery without angina pectoris: Secondary | ICD-10-CM | POA: Insufficient documentation

## 2022-11-27 DIAGNOSIS — I700 Atherosclerosis of aorta: Secondary | ICD-10-CM | POA: Insufficient documentation

## 2022-11-27 DIAGNOSIS — Z5111 Encounter for antineoplastic chemotherapy: Secondary | ICD-10-CM | POA: Diagnosis not present

## 2022-11-27 DIAGNOSIS — L299 Pruritus, unspecified: Secondary | ICD-10-CM | POA: Diagnosis not present

## 2022-11-27 DIAGNOSIS — Z803 Family history of malignant neoplasm of breast: Secondary | ICD-10-CM | POA: Diagnosis not present

## 2022-11-27 DIAGNOSIS — K760 Fatty (change of) liver, not elsewhere classified: Secondary | ICD-10-CM | POA: Insufficient documentation

## 2022-11-27 DIAGNOSIS — Z510 Encounter for antineoplastic radiation therapy: Secondary | ICD-10-CM | POA: Diagnosis not present

## 2022-11-27 LAB — RAD ONC ARIA SESSION SUMMARY
Course Elapsed Days: 37
Plan Fractions Treated to Date: 28
Plan Prescribed Dose Per Fraction: 1.8 Gy
Plan Total Fractions Prescribed: 30
Plan Total Prescribed Dose: 54 Gy
Reference Point Dosage Given to Date: 50.4 Gy
Reference Point Session Dosage Given: 1.8 Gy
Session Number: 28

## 2022-11-27 MED ORDER — SODIUM CHLORIDE 0.9% FLUSH
10.0000 mL | INTRAVENOUS | Status: DC | PRN
  Administered 2022-11-27: 10 mL
  Filled 2022-11-27: qty 10

## 2022-11-27 MED ORDER — HEPARIN SOD (PORK) LOCK FLUSH 100 UNIT/ML IV SOLN
500.0000 [IU] | Freq: Once | INTRAVENOUS | Status: AC | PRN
  Administered 2022-11-27: 500 [IU]
  Filled 2022-11-27: qty 5

## 2022-11-28 ENCOUNTER — Ambulatory Visit
Admission: RE | Admit: 2022-11-28 | Discharge: 2022-11-28 | Disposition: A | Discharge status: Home / Self Care | Payer: No Typology Code available for payment source | Source: Ambulatory Visit | Attending: Radiation Oncology | Admitting: Radiation Oncology

## 2022-11-28 ENCOUNTER — Other Ambulatory Visit: Payer: Self-pay

## 2022-11-28 DIAGNOSIS — Z510 Encounter for antineoplastic radiation therapy: Secondary | ICD-10-CM | POA: Diagnosis not present

## 2022-11-28 LAB — RAD ONC ARIA SESSION SUMMARY
Course Elapsed Days: 38
Plan Fractions Treated to Date: 29
Plan Prescribed Dose Per Fraction: 1.8 Gy
Plan Total Fractions Prescribed: 30
Plan Total Prescribed Dose: 54 Gy
Reference Point Dosage Given to Date: 52.2 Gy
Reference Point Session Dosage Given: 1.8 Gy
Session Number: 29

## 2022-11-29 ENCOUNTER — Ambulatory Visit
Admission: RE | Admit: 2022-11-29 | Discharge: 2022-11-29 | Disposition: A | Discharge status: Home / Self Care | Payer: No Typology Code available for payment source | Source: Ambulatory Visit | Attending: Radiation Oncology | Admitting: Radiation Oncology

## 2022-11-29 ENCOUNTER — Other Ambulatory Visit: Payer: Self-pay

## 2022-11-29 DIAGNOSIS — C159 Malignant neoplasm of esophagus, unspecified: Secondary | ICD-10-CM | POA: Diagnosis not present

## 2022-11-29 DIAGNOSIS — Z510 Encounter for antineoplastic radiation therapy: Secondary | ICD-10-CM | POA: Diagnosis not present

## 2022-11-29 LAB — RAD ONC ARIA SESSION SUMMARY
Course Elapsed Days: 39
Plan Fractions Treated to Date: 30
Plan Prescribed Dose Per Fraction: 1.8 Gy
Plan Total Fractions Prescribed: 30
Plan Total Prescribed Dose: 54 Gy
Reference Point Dosage Given to Date: 54 Gy
Reference Point Session Dosage Given: 1.8 Gy
Session Number: 30

## 2022-12-02 ENCOUNTER — Ambulatory Visit: Payer: No Typology Code available for payment source

## 2022-12-02 ENCOUNTER — Other Ambulatory Visit: Payer: Self-pay | Admitting: *Deleted

## 2022-12-02 ENCOUNTER — Ambulatory Visit: Payer: No Typology Code available for payment source | Admitting: Oncology

## 2022-12-02 ENCOUNTER — Other Ambulatory Visit: Payer: No Typology Code available for payment source

## 2022-12-02 MED ORDER — POTASSIUM CHLORIDE CRYS ER 20 MEQ PO TBCR: 20.0000 meq | EXTENDED_RELEASE_TABLET | Freq: Two times a day (BID) | ORAL | 0 refills | Status: DC

## 2022-12-02 NOTE — Telephone Encounter (Signed)
Component Ref Range & Units 7 d ago (11/25/22) 2 wk ago (11/18/22) 4 wk ago (11/04/22) 1 mo ago (10/28/22) 1 mo ago (10/21/22) 1 mo ago (10/10/22) 3 mo ago (08/28/22)  Potassium 3.5 - 5.1 mmol/L 3.2 Low  2.9 Low  3.2 Low  2.7 Low Panic  CM 3.0 Low  3.1 Low  2.9 Low

## 2022-12-06 MED FILL — Dexamethasone Sodium Phosphate Inj 100 MG/10ML: INTRAMUSCULAR | Qty: 1 | Status: AC

## 2022-12-09 ENCOUNTER — Inpatient Hospital Stay: Payer: No Typology Code available for payment source

## 2022-12-09 ENCOUNTER — Other Ambulatory Visit: Payer: Self-pay

## 2022-12-09 ENCOUNTER — Encounter: Payer: Self-pay | Admitting: Oncology

## 2022-12-09 ENCOUNTER — Inpatient Hospital Stay (HOSPITAL_BASED_OUTPATIENT_CLINIC_OR_DEPARTMENT_OTHER): Payer: No Typology Code available for payment source | Admitting: Oncology

## 2022-12-09 VITALS — BP 124/83 | HR 74 | Temp 96.1°F | Resp 18 | Wt 236.9 lb

## 2022-12-09 DIAGNOSIS — I82621 Acute embolism and thrombosis of deep veins of right upper extremity: Secondary | ICD-10-CM | POA: Diagnosis not present

## 2022-12-09 DIAGNOSIS — E876 Hypokalemia: Secondary | ICD-10-CM | POA: Diagnosis not present

## 2022-12-09 DIAGNOSIS — Z5111 Encounter for antineoplastic chemotherapy: Secondary | ICD-10-CM | POA: Diagnosis not present

## 2022-12-09 DIAGNOSIS — C159 Malignant neoplasm of esophagus, unspecified: Secondary | ICD-10-CM | POA: Diagnosis not present

## 2022-12-09 DIAGNOSIS — D702 Other drug-induced agranulocytosis: Secondary | ICD-10-CM | POA: Diagnosis not present

## 2022-12-09 LAB — CBC WITH DIFFERENTIAL (CANCER CENTER ONLY)
Abs Immature Granulocytes: 0.01 10*3/uL (ref 0.00–0.07)
Basophils Absolute: 0 10*3/uL (ref 0.0–0.1)
Basophils Relative: 1 %
Eosinophils Absolute: 0.1 10*3/uL (ref 0.0–0.5)
Eosinophils Relative: 4 %
HCT: 35.9 % — ABNORMAL LOW (ref 39.0–52.0)
Hemoglobin: 11.6 g/dL — ABNORMAL LOW (ref 13.0–17.0)
Immature Granulocytes: 0 %
Lymphocytes Relative: 22 %
Lymphs Abs: 0.7 10*3/uL (ref 0.7–4.0)
MCH: 26.9 pg (ref 26.0–34.0)
MCHC: 32.3 g/dL (ref 30.0–36.0)
MCV: 83.1 fL (ref 80.0–100.0)
Monocytes Absolute: 0.5 10*3/uL (ref 0.1–1.0)
Monocytes Relative: 16 %
Neutro Abs: 1.8 10*3/uL (ref 1.7–7.7)
Neutrophils Relative %: 57 %
Platelet Count: 136 10*3/uL — ABNORMAL LOW (ref 150–400)
RBC: 4.32 MIL/uL (ref 4.22–5.81)
RDW: 17.8 % — ABNORMAL HIGH (ref 11.5–15.5)
WBC Count: 3.3 10*3/uL — ABNORMAL LOW (ref 4.0–10.5)
nRBC: 0 % (ref 0.0–0.2)

## 2022-12-09 LAB — CMP (CANCER CENTER ONLY)
ALT: 31 U/L (ref 0–44)
AST: 31 U/L (ref 15–41)
Albumin: 3.6 g/dL (ref 3.5–5.0)
Alkaline Phosphatase: 46 U/L (ref 38–126)
Anion gap: 11 (ref 5–15)
BUN: 13 mg/dL (ref 8–23)
CO2: 26 mmol/L (ref 22–32)
Calcium: 8.8 mg/dL — ABNORMAL LOW (ref 8.9–10.3)
Chloride: 103 mmol/L (ref 98–111)
Creatinine: 1 mg/dL (ref 0.61–1.24)
GFR, Estimated: >60 mL/min (ref >60)
Glucose, Bld: 111 mg/dL — ABNORMAL HIGH (ref 70–99)
Potassium: 2.9 mmol/L — ABNORMAL LOW (ref 3.5–5.1)
Sodium: 140 mmol/L (ref 135–145)
Total Bilirubin: 0.7 mg/dL (ref 0.3–1.2)
Total Protein: 7 g/dL (ref 6.5–8.1)

## 2022-12-09 LAB — MAGNESIUM: Magnesium: 2 mg/dL (ref 1.7–2.4)

## 2022-12-09 MED ORDER — SODIUM CHLORIDE 0.9 % IV SOLN
2400.0000 mg/m2 | INTRAVENOUS | Status: DC
  Administered 2022-12-09: 5500 mg via INTRAVENOUS
  Filled 2022-12-09: qty 110

## 2022-12-09 MED ORDER — POTASSIUM CHLORIDE CRYS ER 20 MEQ PO TBCR: 20.0000 meq | EXTENDED_RELEASE_TABLET | Freq: Three times a day (TID) | ORAL | 0 refills | Status: DC

## 2022-12-09 MED ORDER — SODIUM CHLORIDE 0.9 % IV SOLN
INTRAVENOUS | Status: DC
  Filled 2022-12-09: qty 250

## 2022-12-09 MED ORDER — PALONOSETRON HCL INJECTION 0.25 MG/5ML
0.2500 mg | Freq: Once | INTRAVENOUS | Status: AC
  Administered 2022-12-09: 0.25 mg via INTRAVENOUS
  Filled 2022-12-09: qty 5

## 2022-12-09 MED ORDER — POTASSIUM CHLORIDE 20 MEQ/100ML IV SOLN
20.0000 meq | INTRAVENOUS | Status: DC | PRN
  Administered 2022-12-09: 20 meq via INTRAVENOUS

## 2022-12-09 MED ORDER — DEXTROSE 5 % IV SOLN
Freq: Once | INTRAVENOUS | Status: AC
  Filled 2022-12-09: qty 250

## 2022-12-09 MED ORDER — LEUCOVORIN CALCIUM INJECTION 350 MG
400.0000 mg/m2 | Freq: Once | INTRAVENOUS | Status: AC
  Administered 2022-12-09: 920 mg via INTRAVENOUS
  Filled 2022-12-09: qty 46

## 2022-12-09 MED ORDER — SODIUM CHLORIDE 0.9 % IV SOLN
10.0000 mg | Freq: Once | INTRAVENOUS | Status: AC
  Administered 2022-12-09: 10 mg via INTRAVENOUS
  Filled 2022-12-09: qty 10

## 2022-12-09 MED ORDER — SODIUM CHLORIDE 0.9% FLUSH
10.0000 mL | INTRAVENOUS | Status: DC | PRN
  Filled 2022-12-09: qty 10

## 2022-12-09 MED ORDER — HEPARIN SOD (PORK) LOCK FLUSH 100 UNIT/ML IV SOLN
500.0000 [IU] | Freq: Once | INTRAVENOUS | Status: DC | PRN
  Filled 2022-12-09: qty 5

## 2022-12-09 MED ORDER — OXALIPLATIN CHEMO INJECTION 100 MG/20ML
75.0000 mg/m2 | Freq: Once | INTRAVENOUS | Status: AC
  Administered 2022-12-09: 175 mg via INTRAVENOUS
  Filled 2022-12-09: qty 15

## 2022-12-09 MED ORDER — FLUOROURACIL CHEMO INJECTION 2.5 GM/50ML
400.0000 mg/m2 | Freq: Once | INTRAVENOUS | Status: AC
  Administered 2022-12-09: 1000 mg via INTRAVENOUS
  Filled 2022-12-09: qty 20

## 2022-12-09 NOTE — Assessment & Plan Note (Addendum)
continue potassium supplementation, increase to TID.  IV KCL x 1 today Check Magnesium level- normal.

## 2022-12-09 NOTE — Patient Instructions (Signed)
[facility] [place] [facility]  Discharge Instructions: Thank you for choosing [facility] to provide your oncology and hematology care.  If you have a lab appointment with the Cancer Center, please go directly to the Cancer Center and check in at the registration area.  Wear comfortable clothing and clothing appropriate for easy access to any Portacath or PICC line.   We strive to give you quality time with your provider. You may need to reschedule your appointment if you arrive late (15 or more minutes).  Arriving late affects you and other patients whose appointments are after yours.  Also, if you miss three or more appointments without notifying the office, you may be dismissed from the clinic at the provider's discretion.      For prescription refill requests, have your pharmacy contact our office and allow 72 hours for refills to be completed.    Today you received the following chemotherapy and/or immunotherapy agents Oxaliplatin, Leucovorin and Adrucil       To help prevent nausea and vomiting after your treatment, we encourage you to take your nausea medication as directed.  BELOW ARE SYMPTOMS THAT SHOULD BE REPORTED IMMEDIATELY: *FEVER GREATER THAN 100.4 F (38 C) OR HIGHER *CHILLS OR SWEATING *NAUSEA AND VOMITING THAT IS NOT CONTROLLED WITH YOUR NAUSEA MEDICATION *UNUSUAL SHORTNESS OF BREATH *UNUSUAL BRUISING OR BLEEDING *URINARY PROBLEMS (pain or burning when urinating, or frequent urination) *BOWEL PROBLEMS (unusual diarrhea, constipation, pain near the anus) TENDERNESS IN MOUTH AND THROAT WITH OR WITHOUT PRESENCE OF ULCERS (sore throat, sores in mouth, or a toothache) UNUSUAL RASH, SWELLING OR PAIN  UNUSUAL VAGINAL DISCHARGE OR ITCHING   Items with * indicate a potential emergency and should be followed up as soon as possible or go to the Emergency Department if any problems should occur.  Please show the CHEMOTHERAPY ALERT CARD or  IMMUNOTHERAPY ALERT CARD at check-in to the Emergency Department and triage nurse.  Should you have questions after your visit or need to cancel or reschedule your appointment, please contact [facility] [place] [facility]  [phone] and follow the prompts.  Office hours are 8:00 a.m. to 4:30 p.m. Monday - Friday. Please note that voicemails left after 4:00 p.m. may not be returned until the following business day.  We are closed weekends and major holidays. You have access to a nurse at all times for urgent questions. Please call the main number to the clinic [phone] and follow the prompts.  For any non-urgent questions, you may also contact your provider using MyChart. We now offer e-Visits for anyone 18 and older to request care online for non-urgent symptoms. For details visit [email or web address].   Also download the MyChart app! Go to the app store, search "MyChart", open the app, select [facility], and log in with your MyChart username and password.    

## 2022-12-09 NOTE — Assessment & Plan Note (Signed)
Chemotherapy plan as listed above 

## 2022-12-09 NOTE — Progress Notes (Signed)
Hematology/Oncology Progress note [phone] [phone]        REFERRING PROVIDER: Rickard Patience, MD  CHIEF COMPLAINTS/PURPOSE OF CONSULTATION:  Stage IV Esophageal adenocarcinoma  ASSESSMENT & PLAN:   Cancer Staging  Adenocarcinoma of esophagus [facility]) Staging form: Esophagus - Adenocarcinoma, AJCC 8th Edition - Clinical stage from 08/28/2022: Stage IVB (cT3, cN1, cM1) - Signed by Rickard Patience, MD on 10/04/2022   Adenocarcinoma of esophagus (HCC) Stage IV esophageal adenocarcinoma, liver metastatic disease I recommend systematic palliative chemotherapy with FOLFOX [Oxaliplatin 75mg /m2 while on RT] .  Labs are reviewed and discussed with patient. Proceed with FOLFOX   Encounter for antineoplastic chemotherapy Chemotherapy plan as listed above.   Hypokalemia continue potassium supplementation, increase to TID.  IV KCL x 1 today Check Magnesium level- normal.  Deep venous thrombosis (HCC) Continue Eliquis 5mg  BID   Drug-induced neutropenia (HCC) Resolved.  Continue Peridex mouth wash    Orders Placed This Encounter  Procedures   CBC with Differential (Cancer Center Only)    Standing Status:   Future    Standing Expiration Date:   01/20/2024   CMP (Cancer Center only)    Standing Status:   Future    Standing Expiration Date:   01/20/2024   CBC with Differential (Cancer Center Only)    Standing Status:   Future    Standing Expiration Date:   02/03/2024   CMP (Cancer Center only)    Standing Status:   Future    Standing Expiration Date:   02/03/2024   Magnesium    Standing Status:   Future    Standing Expiration Date:   12/09/2023   Follow-up  2 weeks  lab MD FOLFOX D3 pump dc  All questions were answered. The patient knows to call the clinic with any problems, questions or concerns.  Rickard Patience, MD, PhD [facility] Hematology Oncology 12/09/2022   HISTORY OF PRESENTING ILLNESS:  Jonathan [name] 67 y.o. male presents to establish care for  esophageal adenocarcinoma I have reviewed his chart and materials related to his cancer extensively and collaborated history with the patient. Summary of oncologic history is as follows: Oncology History  Adenocarcinoma of esophagus (HCC)  08/28/2022 Initial Diagnosis   Adenocarcinoma of esophagus   -Patient has noticed worsening of "food stuck/fullness" sensation since November 2023.  Patient had a barium swallow study which commented on marked mucosal irregularity in the distal esophagus with Broaddus base mural filling defect highly suspicious for malignancy.  Patient establish care with gastroenterology. -08/23/2022, EGD showed gastritis and partially obstructing malignant esophageal tumor in the lower third of the esophagus. Esophagus mass biopsy showed adenocarcinoma.  PD-L1 TPS 65%, HER2 negative.  [facility] NGS showed KRASG12V, CDKN2A, ARID1A, TP53, TMB 5.3, MSI stable.  Stomach biopsy showed gastric mucosa with no specific histology abnormality.  No significant intestinal metaplastic, dysplastic, granular atrophy or increased inflammation.     08/28/2022 Imaging   CT chest abdomen pelvis with contrast showed 1. Distal esophageal primary with gastrohepatic ligament nodal metastasis. 2. 2 right-sided pulmonary nodules, the largest of which measures 5 mm and is new since 2015. Pulmonary metastasis not be excluded. 3. Anterior right lower lobe volume loss and minimal soft tissue density, favoring atelectasis or scar. Recommend attention on follow-up. 4. Hepatic steatosis 5. Cholelithiasis 6. Left nephrolithiasis 7. Coronary artery atherosclerosis. Aortic Atherosclerosis   08/28/2022 Cancer Staging   Staging form: Esophagus - Adenocarcinoma, AJCC 8th Edition - Clinical stage from 08/28/2022: Stage IVB (cT3, cN1, cM1) - Signed  by Rickard Patience, MD on 10/04/2022 Stage prefix: Initial diagnosis   09/06/2022 Imaging   PET scan showed 1. Esophageal primary with gastrohepatic ligament nodal  metastasis,as on CT. 2. Focus of hypermetabolism which is favored to registered to the posterior hepatic dome, in the region of subtle heterogeneity on prior diagnostic CT. Suboptimally evaluated secondary to underlying steatosis. Recommend further evaluation with pre and post contrast abdominal MRI to confirm probable metastasis. 3. Incidental findings, including: Left nephrolithiasis.Cholelithiasis. Coronary artery atherosclerosis. Aortic Atherosclerosis    09/14/2022 - 09/14/2022 Chemotherapy   Patient is on Treatment Plan : ESOPHAGUS Carboplatin + Paclitaxel Weekly X 6 Weeks with XRT     09/23/2022 Imaging   MRI abdomen with and without contrast showed 1. Mildly T2 hyperintense segment VII hepatic lesion measuring 2.7 cm with imaging characteristics compatible with metastatic disease. 2. Tiny focus of delayed enhancement in the inferior right lobe of the liver segment VI measuring 8 mm with ill-defined increased T2 signal and subtle corresponding reduced diffusivity, also suspicious for metastatic disease. 3. Partially visualized distal esophageal wall thickening compatible with the patient's known primary neoplasm. 4. Similar size of the 11 mm gastrohepatic ligament lymph node mildly metabolic on prior PET-CT and compatible with local nodal disease involvement. 5. 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 Recommend follow up pre and post-contrast MRI/MRCP in 1 year. 6. Diffuse hepatic steatosis.   10/10/2022 Procedure   LIVER MASS; CT-GUIDED BIOPSY:  - MODERATE TO POORLY DIFFERENTIATED ADENOCARCINOMA MORPHOLOGICALLY  CONSISTENT WITH METASTASIS FROM PATIENT'S KNOWN ESOPHAGEAL  ADENOCARCINOMA.    10/17/2022 Procedure   Medi port placed by Dr. Wyn Quaker   10/21/2022 -  Chemotherapy   Patient is on Treatment Plan : ESOPHAGEAL ADENOCARCINOMA FOLFOX q14d x 6 cycles     Patient presents to establish care.  He is  not taking PPI. He has intentionally lost some weight. Family history positive for father and paternal uncle with prostate cancer and sister with breast cancer. Denies any routine alcohol use  Right interval jugular vein occlusive DVT, started on Eliquis starter package on 10/28/2022, swelling of neck has improved.    INTERVAL HISTORY Jonathan [name] is a 67 y.o. male who has above history reviewed by me today presents for follow up visit for Stage IV esophageal adenocarcinoma.  He tolerated chemotherapy and radiation. Dysphagias has improved.  Denies neuropathy/ no fever chills, nausea vomiting diarrhea. Weight is stable.   MEDICAL HISTORY:  Past Medical History:  Diagnosis Date   Arthritis    Complication of anesthesia    OCCURRED ONCE YEARS AGO 1981   Esophageal mass    Hypertension    Hypothyroidism    PONV (postoperative nausea and vomiting)     SURGICAL HISTORY: Past Surgical History:  Procedure Laterality Date   COLONOSCOPY     ESOPHAGOGASTRODUODENOSCOPY N/A 08/23/2022   Procedure: ESOPHAGOGASTRODUODENOSCOPY (EGD);  Surgeon: Jaynie Collins, DO;  Location: [facility] ENDOSCOPY;  Service: Gastroenterology;  Laterality: N/A;   EUS N/A 09/12/2022   Procedure: FULL UPPER ENDOSCOPIC ULTRASOUND (EUS) RADIAL;  Surgeon: Bearl Mulberry, MD;  Location: [facility] ENDOSCOPY;  Service: Gastroenterology;  Laterality: N/A;   FINGER SURGERY     PORTA CATH INSERTION N/A 10/17/2022   Procedure: PORTA CATH INSERTION;  Surgeon: Annice Needy, MD;  Location: [facility] INVASIVE CV LAB;  Service: Cardiovascular;  Laterality: N/A;   TENDON REPAIR IN LEFT KNEE      SOCIAL HISTORY: Social  History   Socioeconomic History   Marital status: Single    Spouse name: Not on file   Number of children: Not on file   Years of education: Not on file   Highest education level: Not on file  Occupational History   Not on file  Tobacco Use   Smoking status: Never   Smokeless tobacco: Never  Vaping Use    Vaping Use: Never used  Substance and Sexual Activity   Alcohol use: Yes    Comment: OCCASIONALLY   Drug use: Never   Sexual activity: Not on file  Other Topics Concern   Not on file  Social History Narrative   Not on file   Social Determinants of Health   Financial Resource Strain: Not on file  Food Insecurity: No Food Insecurity (08/28/2022)   Hunger Vital Sign    Worried About Running Out of Food in the Last Year: Never true    Ran Out of Food in the Last Year: Never true  Transportation Needs: No Transportation Needs (08/28/2022)   PRAPARE - Administrator, Civil Service (Medical): No    Lack of Transportation (Non-Medical): No  Physical Activity: Not on file  Stress: Not on file  Social Connections: Not on file  Intimate Partner Violence: Not At Risk (08/28/2022)   Humiliation, Afraid, Rape, and Kick questionnaire    Fear of Current or Ex-Partner: No    Emotionally Abused: No    Physically Abused: No    Sexually Abused: No    FAMILY HISTORY: Family History  Problem Relation Age of Onset   Heart attack Mother    Prostate cancer Father    Breast cancer Sister    Prostate cancer Paternal Uncle     ALLERGIES:  has No Known Allergies.  MEDICATIONS:  Current Outpatient Medications  Medication Sig Dispense Refill   acetaminophen (TYLENOL) 650 MG CR tablet Take 1,300 mg by mouth every 8 (eight) hours as needed for pain.     amLODipine (NORVASC) 5 MG tablet Take 5 mg by mouth daily.     apixaban (ELIQUIS) 5 MG TABS tablet Take 1 tablet (5 mg total) by mouth 2 (two) times daily. 60 tablet 1   APIXABAN (ELIQUIS) VTE STARTER PACK (10MG  AND 5MG ) Take as directed on package: start with two-5mg  tablets twice daily for 7 days. On day 8, switch to one-5mg  tablet twice daily. 1 each 0   atenolol (TENORMIN) 50 MG tablet Take 50 mg by mouth daily.     chlorhexidine (PERIDEX) 0.12 % solution Use as directed 15 mLs in the mouth or throat 2 (two) times daily. 473 mL 1    Cholecalciferol (VITAMIN D3) 125 MCG (5000 UT) CAPS Take 5,000 Units by mouth daily.     hydrochlorothiazide (HYDRODIURIL) 25 MG tablet Take 25 mg by mouth daily.     levothyroxine (SYNTHROID) 100 MCG tablet Take 100 mcg by mouth daily before breakfast.     lidocaine-prilocaine (EMLA) cream Apply to affected area once 30 g 6   Multiple Vitamins-Minerals (MULTIVITAMIN WITH MINERALS) tablet Take 1 tablet by mouth daily.     omeprazole (PRILOSEC) 20 MG capsule Take 20 mg by mouth daily.     ondansetron (ZOFRAN) 8 MG tablet Take 1 tablet (8 mg total) by mouth every 8 (eight) hours as needed for nausea or vomiting. Start on the third day after chemotherapy. 30 tablet 1   potassium chloride SA (KLOR-CON M) 20 MEQ tablet Take 1 tablet (20 mEq total)  by mouth 2 (two) times daily. 60 tablet 0   prochlorperazine (COMPAZINE) 10 MG tablet Take 1 tablet (10 mg total) by mouth every 6 (six) hours as needed for nausea or vomiting. 30 tablet 1   zinc gluconate 50 MG tablet Take 50 mg by mouth daily.     No current facility-administered medications for this visit.   Facility-Administered Medications Ordered in Other Visits  Medication Dose Route Frequency Provider Last Rate Last Admin   0.9 %  sodium chloride infusion   Intravenous Continuous Rickard Patience, MD 20 mL/hr at 12/09/22 0917 New Bag at 12/09/22 0917   dexamethasone (DECADRON) 10 mg in sodium chloride 0.9 % 50 mL IVPB  10 mg Intravenous Once Rickard Patience, MD 204 mL/hr at 12/09/22 0925 10 mg at 12/09/22 0925   dextrose 5 % solution   Intravenous Once Rickard Patience, MD       fluorouracil (ADRUCIL) 5,500 mg in sodium chloride 0.9 % 140 mL chemo infusion  2,400 mg/m2 (Order-Specific) Intravenous 1 day or 1 dose Rickard Patience, MD       fluorouracil (ADRUCIL) chemo injection 1,000 mg  400 mg/m2 (Order-Specific) Intravenous Once Rickard Patience, MD       heparin lock flush 100 unit/mL  500 Units Intracatheter Once PRN Rickard Patience, MD       leucovorin 920 mg in dextrose 5 % 250 mL  infusion  400 mg/m2 (Order-Specific) Intravenous Once Rickard Patience, MD       oxaliplatin (ELOXATIN) 175 mg in dextrose 5 % 500 mL chemo infusion  75 mg/m2 (Order-Specific) Intravenous Once Rickard Patience, MD       potassium chloride 20 mEq in 100 mL IVPB  20 mEq Intravenous PRN Rickard Patience, MD 100 mL/hr at 12/09/22 0924 20 mEq at 12/09/22 0924   sodium chloride flush (NS) 0.9 % injection 10 mL  10 mL Intracatheter PRN Rickard Patience, MD        Review of Systems  Constitutional:  Negative for appetite change, chills, fatigue and fever.  HENT:   Negative for hearing loss and voice change.   Eyes:  Negative for eye problems and icterus.  Respiratory:  Negative for chest tightness, cough and shortness of breath.   Cardiovascular:  Negative for chest pain and leg swelling.  Gastrointestinal:  Negative for abdominal distention, abdominal pain, nausea and vomiting.       Dysphagia/epigastric fullness  Endocrine: Negative for hot flashes.  Genitourinary:  Negative for difficulty urinating, dysuria and frequency.   Musculoskeletal:  Negative for arthralgias.  Skin:  Negative for itching and rash.  Neurological:  Negative for light-headedness and numbness.  Hematological:  Negative for adenopathy. Does not bruise/bleed easily.  Psychiatric/Behavioral:  Negative for confusion.      PHYSICAL EXAMINATION: ECOG PERFORMANCE STATUS: 0 - Asymptomatic  Vitals:   12/09/22 0840  BP: 124/83  Pulse: 74  Resp: 18  Temp: (!) 96.1 F (35.6 C)  SpO2: 98%   Filed Weights   12/09/22 0840  Weight: 236 lb 14.4 oz (107.5 kg)    Physical Exam Constitutional:      General: He is not in acute distress.    Appearance: He is obese. He is not diaphoretic.  HENT:     Head: Normocephalic and atraumatic.     Nose: Nose normal.     Mouth/Throat:     Pharynx: No oropharyngeal exudate.  Eyes:     General: No scleral icterus.    Pupils: Pupils are equal, round, and reactive to  light.  Cardiovascular:     Rate and Rhythm:  Normal rate and regular rhythm.     Heart sounds: No murmur heard. Pulmonary:     Effort: Pulmonary effort is normal. No respiratory distress.     Breath sounds: No rales.  Chest:     Chest wall: No tenderness.  Abdominal:     General: There is no distension.     Palpations: Abdomen is soft.     Tenderness: There is no abdominal tenderness.  Musculoskeletal:        General: Normal range of motion.     Cervical back: Normal range of motion and neck supple.  Skin:    General: Skin is warm and dry.     Findings: No erythema.  Neurological:     Mental Status: He is alert and oriented to person, place, and time.     Cranial Nerves: No cranial nerve deficit.     Motor: No abnormal muscle tone.     Coordination: Coordination normal.  Psychiatric:        Mood and Affect: Affect normal.      LABORATORY DATA:  I have reviewed the data as listed    Latest Ref Rng & Units 12/09/2022    8:06 AM 11/25/2022    8:27 AM 11/18/2022    8:33 AM  CBC  WBC 4.0 - 10.5 K/uL 3.3  3.8  1.7   Hemoglobin 13.0 - 17.0 g/dL 16.1  09.6  04.5   Hematocrit 39.0 - 52.0 % 35.9  38.6  37.3   Platelets 150 - 400 K/uL 136  295  160       Latest Ref Rng & Units 12/09/2022    8:06 AM 11/25/2022    8:27 AM 11/18/2022    8:33 AM  CMP  Glucose 70 - 99 mg/dL 409  811  914   BUN 8 - 23 mg/dL 13  13  16    Creatinine 0.61 - 1.24 mg/dL 7.82  9.56  2.13   Sodium 135 - 145 mmol/L 140  135  139   Potassium 3.5 - 5.1 mmol/L 2.9  3.2  2.9   Chloride 98 - 111 mmol/L 103  102  104   CO2 22 - 32 mmol/L 26  25  26    Calcium 8.9 - 10.3 mg/dL 8.8  8.9  8.9   Total Protein 6.5 - 8.1 g/dL 7.0  7.1  7.1   Total Bilirubin 0.3 - 1.2 mg/dL 0.7  0.4  0.6   Alkaline Phos 38 - 126 U/L 46  52  52   AST 15 - 41 U/L 31  43  25   ALT 0 - 44 U/L 31  39  26      RADIOGRAPHIC STUDIES: I have personally reviewed the radiological images as listed and agreed with the findings in the report. No results found.

## 2022-12-09 NOTE — Assessment & Plan Note (Signed)
Continue Eliquis 5mg BID 

## 2022-12-09 NOTE — Assessment & Plan Note (Addendum)
Resolved.  Continue Peridex mouth wash

## 2022-12-09 NOTE — Assessment & Plan Note (Addendum)
Stage IV esophageal adenocarcinoma, liver metastatic disease I recommend systematic palliative chemotherapy with FOLFOX [Oxaliplatin 75mg/m2 while on RT] .  Labs are reviewed and discussed with patient. Proceed with FOLFOX   

## 2022-12-10 ENCOUNTER — Other Ambulatory Visit: Payer: Self-pay

## 2022-12-11 ENCOUNTER — Inpatient Hospital Stay: Payer: No Typology Code available for payment source

## 2022-12-11 VITALS — BP 124/85

## 2022-12-11 DIAGNOSIS — C159 Malignant neoplasm of esophagus, unspecified: Secondary | ICD-10-CM

## 2022-12-11 DIAGNOSIS — Z5111 Encounter for antineoplastic chemotherapy: Secondary | ICD-10-CM | POA: Diagnosis not present

## 2022-12-11 MED ORDER — SODIUM CHLORIDE 0.9% FLUSH
10.0000 mL | INTRAVENOUS | Status: DC | PRN
  Administered 2022-12-11: 10 mL
  Filled 2022-12-11: qty 10

## 2022-12-11 MED ORDER — HEPARIN SOD (PORK) LOCK FLUSH 100 UNIT/ML IV SOLN
500.0000 [IU] | Freq: Once | INTRAVENOUS | Status: AC | PRN
  Administered 2022-12-11: 500 [IU]
  Filled 2022-12-11: qty 5

## 2022-12-12 ENCOUNTER — Inpatient Hospital Stay: Payer: No Typology Code available for payment source

## 2022-12-12 NOTE — Progress Notes (Signed)
Nutrition Follow-up:  Patient with stage IV supraglottic, larynx cancer, p 16+.  Patient completed radiation.  Folfox continues.    Spoke with patient via phone.  Reports that has been nauseated following chemo and on Tuesday.  Felt better yesterday and today.  Has been able to eat cereal for breakfast.  Had a peanut butter and jelly sandwich for lunch yesterday and cottage cheese and pineapple.  Has been eating yogurt as well.  Drinking protein shakes as well (350 calorie).  Denies trouble with foods going down except for pills.      Medications: reviewed  Labs: K 2.9 (supplemented), glucose 111, calcium 8.8  Anthropometrics:   Weight 236 lb 14.4 oz on 5/13 240 lb 1.6 oz on 4/22 244 lb 1.6 oz on 4/9 265 lb on 1/31   NUTRITION DIAGNOSIS: Inadequate oral intake ongoing    INTERVENTION:  Continue to encourage high calorie, high protein foods Continue 350 calorie shake Continue antiemetics     MONITORING, EVALUATION, GOAL: weight trends, intake   NEXT VISIT: Thursday, July 11th phone call  Jonathan [name], RD, LDN Registered Dietitian [phone]

## 2022-12-13 ENCOUNTER — Other Ambulatory Visit: Payer: Self-pay

## 2022-12-17 DIAGNOSIS — M62830 Muscle spasm of back: Secondary | ICD-10-CM | POA: Diagnosis not present

## 2022-12-17 DIAGNOSIS — G4733 Obstructive sleep apnea (adult) (pediatric): Secondary | ICD-10-CM | POA: Diagnosis not present

## 2022-12-17 DIAGNOSIS — M9903 Segmental and somatic dysfunction of lumbar region: Secondary | ICD-10-CM | POA: Diagnosis not present

## 2022-12-17 DIAGNOSIS — M9901 Segmental and somatic dysfunction of cervical region: Secondary | ICD-10-CM | POA: Diagnosis not present

## 2022-12-17 DIAGNOSIS — M9902 Segmental and somatic dysfunction of thoracic region: Secondary | ICD-10-CM | POA: Diagnosis not present

## 2022-12-18 ENCOUNTER — Other Ambulatory Visit: Payer: Self-pay

## 2022-12-18 ENCOUNTER — Telehealth: Payer: Self-pay | Admitting: *Deleted

## 2022-12-18 ENCOUNTER — Inpatient Hospital Stay: Payer: No Typology Code available for payment source

## 2022-12-18 ENCOUNTER — Inpatient Hospital Stay (HOSPITAL_BASED_OUTPATIENT_CLINIC_OR_DEPARTMENT_OTHER): Payer: No Typology Code available for payment source | Admitting: Medical Oncology

## 2022-12-18 ENCOUNTER — Encounter: Payer: Self-pay | Admitting: Medical Oncology

## 2022-12-18 VITALS — BP 120/85 | HR 88 | Temp 98.6°F | Wt 235.0 lb

## 2022-12-18 DIAGNOSIS — C159 Malignant neoplasm of esophagus, unspecified: Secondary | ICD-10-CM

## 2022-12-18 DIAGNOSIS — R21 Rash and other nonspecific skin eruption: Secondary | ICD-10-CM

## 2022-12-18 DIAGNOSIS — Z5111 Encounter for antineoplastic chemotherapy: Secondary | ICD-10-CM | POA: Diagnosis not present

## 2022-12-18 LAB — COMPREHENSIVE METABOLIC PANEL
ALT: 58 U/L — ABNORMAL HIGH (ref 0–44)
AST: 45 U/L — ABNORMAL HIGH (ref 15–41)
Albumin: 3.7 g/dL (ref 3.5–5.0)
Alkaline Phosphatase: 55 U/L (ref 38–126)
Anion gap: 13 (ref 5–15)
BUN: 11 mg/dL (ref 8–23)
CO2: 23 mmol/L (ref 22–32)
Calcium: 8.9 mg/dL (ref 8.9–10.3)
Chloride: 101 mmol/L (ref 98–111)
Creatinine, Ser: 0.94 mg/dL (ref 0.61–1.24)
GFR, Estimated: >60 mL/min (ref >60)
Glucose, Bld: 108 mg/dL — ABNORMAL HIGH (ref 70–99)
Potassium: 2.7 mmol/L — CL (ref 3.5–5.1)
Sodium: 137 mmol/L (ref 135–145)
Total Bilirubin: 0.5 mg/dL (ref 0.3–1.2)
Total Protein: 7.2 g/dL (ref 6.5–8.1)

## 2022-12-18 LAB — CBC WITH DIFFERENTIAL/PLATELET
Abs Immature Granulocytes: 0 10*3/uL (ref 0.00–0.07)
Basophils Absolute: 0 10*3/uL (ref 0.0–0.1)
Basophils Relative: 2 %
Eosinophils Absolute: 0.1 10*3/uL (ref 0.0–0.5)
Eosinophils Relative: 6 %
HCT: 36.4 % — ABNORMAL LOW (ref 39.0–52.0)
Hemoglobin: 12 g/dL — ABNORMAL LOW (ref 13.0–17.0)
Immature Granulocytes: 0 %
Lymphocytes Relative: 28 %
Lymphs Abs: 0.7 10*3/uL (ref 0.7–4.0)
MCH: 27.3 pg (ref 26.0–34.0)
MCHC: 33 g/dL (ref 30.0–36.0)
MCV: 82.7 fL (ref 80.0–100.0)
Monocytes Absolute: 0.3 10*3/uL (ref 0.1–1.0)
Monocytes Relative: 14 %
Neutro Abs: 1.3 10*3/uL — ABNORMAL LOW (ref 1.7–7.7)
Neutrophils Relative %: 50 %
Platelets: 170 10*3/uL (ref 150–400)
RBC: 4.4 MIL/uL (ref 4.22–5.81)
RDW: 18.4 % — ABNORMAL HIGH (ref 11.5–15.5)
WBC: 2.5 10*3/uL — ABNORMAL LOW (ref 4.0–10.5)
nRBC: 0 % (ref 0.0–0.2)

## 2022-12-18 MED ORDER — METHYLPREDNISOLONE 4 MG PO TBPK: ORAL_TABLET | ORAL | 0 refills | Status: DC

## 2022-12-18 NOTE — Progress Notes (Signed)
[facility] at [facility] Telephone:(336) [phone] Fax:(336) [phone]  Patient Care Team: [name], MD as PCP - General (Family Medicine) [name], RN as Oncology Nurse Navigator   Name of the patient: Jonathan [name]  [id number]  [birth year]   Oncological History: Stage IVB esophageal cancer  Current Treatment: Folfox q14 days x 6 cycles s/p cycle 4 on 05/13/20204  Date of visit: 12/18/22  Reason for Consult: Jonathan [name] is a 67 y.o. male who presents today for:  Itching: Patient reports that the day after his chemotherapy pump was removed on 12/11/2022 he started to have itching of his back. He then began to have itching of his feet, arms, legs. Itching is mild to moderate in nature. Possible red rash of back and arms. He has tried benadryl at night which has not helped a lot. Of note he did start using a new body wash, new laundry scent crystals and took a bath with scented products around this same time- these items have never bothered him before though.   He reports that he forgot to take his potassium and afternoon but will go home and take them in just a few minutes.    Denies any neurologic complaints. Denies recent fevers or illnesses. Denies any easy bleeding or bruising. Reports good appetite and denies weight loss. Denies chest pain. Denies any nausea, vomiting, constipation, or diarrhea. Denies urinary complaints.No SOB, cough, facial swelling.     PAST MEDICAL HISTORY: Past Medical History:  Diagnosis Date   Arthritis    Complication of anesthesia    OCCURRED ONCE YEARS AGO 1981   Esophageal mass    Hypertension    Hypothyroidism    PONV (postoperative nausea and vomiting)     PAST SURGICAL HISTORY:  Past Surgical History:  Procedure Laterality Date   COLONOSCOPY     ESOPHAGOGASTRODUODENOSCOPY N/A 08/23/2022   Procedure: ESOPHAGOGASTRODUODENOSCOPY (EGD);  Surgeon: Jaynie Collins,  DO;  Location: [facility] ENDOSCOPY;  Service: Gastroenterology;  Laterality: N/A;   EUS N/A 09/12/2022   Procedure: FULL UPPER ENDOSCOPIC ULTRASOUND (EUS) RADIAL;  Surgeon: Bearl Mulberry, MD;  Location: [facility] ENDOSCOPY;  Service: Gastroenterology;  Laterality: N/A;   FINGER SURGERY     PORTA CATH INSERTION N/A 10/17/2022   Procedure: PORTA CATH INSERTION;  Surgeon: Annice Needy, MD;  Location: [facility] INVASIVE CV LAB;  Service: Cardiovascular;  Laterality: N/A;   TENDON REPAIR IN LEFT KNEE      HEMATOLOGY/ONCOLOGY HISTORY:  Oncology History  Adenocarcinoma of esophagus (HCC)  08/28/2022 Initial Diagnosis   Adenocarcinoma of esophagus   -Patient has noticed worsening of "food stuck/fullness" sensation since November 2023.  Patient had a barium swallow study which commented on marked mucosal irregularity in the distal esophagus with Broaddus base mural filling defect highly suspicious for malignancy.  Patient establish care with gastroenterology. -08/23/2022, EGD showed gastritis and partially obstructing malignant esophageal tumor in the lower third of the esophagus. Esophagus mass biopsy showed adenocarcinoma.  PD-L1 TPS 65%, HER2 negative.  [facility] NGS showed KRASG12V, CDKN2A, ARID1A, TP53, TMB 5.3, MSI stable.  Stomach biopsy showed gastric mucosa with no specific histology abnormality.  No significant intestinal metaplastic, dysplastic, granular atrophy or increased inflammation.     08/28/2022 Imaging   CT chest abdomen pelvis with contrast showed 1. Distal esophageal primary with gastrohepatic ligament nodal metastasis. 2. 2 right-sided pulmonary nodules, the largest of which measures 5 mm and is new since 2015. Pulmonary metastasis not be excluded.  3. Anterior right lower lobe volume loss and minimal soft tissue density, favoring atelectasis or scar. Recommend attention on follow-up. 4. Hepatic steatosis 5. Cholelithiasis 6. Left nephrolithiasis 7. Coronary artery atherosclerosis. Aortic  Atherosclerosis   08/28/2022 Cancer Staging   Staging form: Esophagus - Adenocarcinoma, AJCC 8th Edition - Clinical stage from 08/28/2022: Stage IVB (cT3, cN1, cM1) - Signed by Rickard Patience, MD on 10/04/2022 Stage prefix: Initial diagnosis   09/06/2022 Imaging   PET scan showed 1. Esophageal primary with gastrohepatic ligament nodal metastasis,as on CT. 2. Focus of hypermetabolism which is favored to registered to the posterior hepatic dome, in the region of subtle heterogeneity on prior diagnostic CT. Suboptimally evaluated secondary to underlying steatosis. Recommend further evaluation with pre and post contrast abdominal MRI to confirm probable metastasis. 3. Incidental findings, including: Left nephrolithiasis.Cholelithiasis. Coronary artery atherosclerosis. Aortic Atherosclerosis    09/14/2022 - 09/14/2022 Chemotherapy   Patient is on Treatment Plan : ESOPHAGUS Carboplatin + Paclitaxel Weekly X 6 Weeks with XRT     09/23/2022 Imaging   MRI abdomen with and without contrast showed 1. Mildly T2 hyperintense segment VII hepatic lesion measuring 2.7 cm with imaging characteristics compatible with metastatic disease. 2. Tiny focus of delayed enhancement in the inferior right lobe of the liver segment VI measuring 8 mm with ill-defined increased T2 signal and subtle corresponding reduced diffusivity, also suspicious for metastatic disease. 3. Partially visualized distal esophageal wall thickening compatible with the patient's known primary neoplasm. 4. Similar size of the 11 mm gastrohepatic ligament lymph node mildly metabolic on prior PET-CT and compatible with local nodal disease involvement. 5. 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 Recommend follow up pre and post-contrast MRI/MRCP in 1 year. 6. Diffuse hepatic steatosis.   10/10/2022 Procedure   LIVER MASS; CT-GUIDED BIOPSY:  - MODERATE TO POORLY  DIFFERENTIATED ADENOCARCINOMA MORPHOLOGICALLY  CONSISTENT WITH METASTASIS FROM PATIENT'S KNOWN ESOPHAGEAL  ADENOCARCINOMA.    10/17/2022 Procedure   Medi port placed by Dr. Wyn Quaker   10/21/2022 -  Chemotherapy   Patient is on Treatment Plan : ESOPHAGEAL ADENOCARCINOMA FOLFOX q14d x 6 cycles       ALLERGIES:  has No Known Allergies.  MEDICATIONS:  Current Outpatient Medications  Medication Sig Dispense Refill   acetaminophen (TYLENOL) 650 MG CR tablet Take 1,300 mg by mouth every 8 (eight) hours as needed for pain.     amLODipine (NORVASC) 5 MG tablet Take 5 mg by mouth daily.     apixaban (ELIQUIS) 5 MG TABS tablet Take 1 tablet (5 mg total) by mouth 2 (two) times daily. 60 tablet 1   APIXABAN (ELIQUIS) VTE STARTER PACK (10MG  AND 5MG ) Take as directed on package: start with two-5mg  tablets twice daily for 7 days. On day 8, switch to one-5mg  tablet twice daily. 1 each 0   atenolol (TENORMIN) 50 MG tablet Take 50 mg by mouth daily.     chlorhexidine (PERIDEX) 0.12 % solution Use as directed 15 mLs in the mouth or throat 2 (two) times daily. 473 mL 1   Cholecalciferol (VITAMIN D3) 125 MCG (5000 UT) CAPS Take 5,000 Units by mouth daily.     hydrochlorothiazide (HYDRODIURIL) 25 MG tablet Take 25 mg by mouth daily.     levothyroxine (SYNTHROID) 100 MCG tablet Take 100 mcg by mouth daily before breakfast.     lidocaine-prilocaine (EMLA) cream Apply to affected area once 30 g 6   methylPREDNISolone (MEDROL  DOSEPAK) 4 MG TBPK tablet Take by mouth as directed 1 each 0   Multiple Vitamins-Minerals (MULTIVITAMIN WITH MINERALS) tablet Take 1 tablet by mouth daily.     omeprazole (PRILOSEC) 20 MG capsule Take 20 mg by mouth daily.     ondansetron (ZOFRAN) 8 MG tablet Take 1 tablet (8 mg total) by mouth every 8 (eight) hours as needed for nausea or vomiting. Start on the third day after chemotherapy. 30 tablet 1   potassium chloride SA (KLOR-CON M) 20 MEQ tablet Take 1 tablet (20 mEq total) by mouth 3  (three) times daily. 60 tablet 0   prochlorperazine (COMPAZINE) 10 MG tablet Take 1 tablet (10 mg total) by mouth every 6 (six) hours as needed for nausea or vomiting. 30 tablet 1   zinc gluconate 50 MG tablet Take 50 mg by mouth daily.     No current facility-administered medications for this visit.    VITAL SIGNS: BP 120/85 (BP Location: Left Arm, Patient Position: Sitting)   Pulse 88   Temp 98.6 F (37 C) (Tympanic)   Wt 235 lb (106.6 kg)   SpO2 98%   BMI 34.70 kg/m  Filed Weights   12/18/22 1440  Weight: 235 lb (106.6 kg)    Estimated body mass index is 34.7 kg/m as calculated from the following:   Height as of 10/21/22: 5\' 9"  (1.753 m).   Weight as of this encounter: 235 lb (106.6 kg).  LABS: CBC:    Component Value Date/Time   WBC 2.5 (L) 12/18/2022 1432   HGB 12.0 (L) 12/18/2022 1432   HGB 11.6 (L) 12/09/2022 0806   HCT 36.4 (L) 12/18/2022 1432   PLT 170 12/18/2022 1432   PLT 136 (L) 12/09/2022 0806   MCV 82.7 12/18/2022 1432   NEUTROABS 1.3 (L) 12/18/2022 1432   LYMPHSABS 0.7 12/18/2022 1432   MONOABS 0.3 12/18/2022 1432   EOSABS 0.1 12/18/2022 1432   BASOSABS 0.0 12/18/2022 1432   Comprehensive Metabolic Panel:    Component Value Date/Time   NA 137 12/18/2022 1432   K 2.7 (LL) 12/18/2022 1432   CL 101 12/18/2022 1432   CO2 23 12/18/2022 1432   BUN 11 12/18/2022 1432   CREATININE 0.94 12/18/2022 1432   CREATININE 1.00 12/09/2022 0806   GLUCOSE 108 (H) 12/18/2022 1432   CALCIUM 8.9 12/18/2022 1432   AST 45 (H) 12/18/2022 1432   AST 31 12/09/2022 0806   ALT 58 (H) 12/18/2022 1432   ALT 31 12/09/2022 0806   ALKPHOS 55 12/18/2022 1432   BILITOT 0.5 12/18/2022 1432   BILITOT 0.7 12/09/2022 0806   PROT 7.2 12/18/2022 1432   ALBUMIN 3.7 12/18/2022 1432    RADIOGRAPHIC STUDIES: No results found.  PERFORMANCE STATUS (ECOG) : 1 - Symptomatic but completely ambulatory  Review of Systems Unless otherwise noted, a complete review of systems is  negative.  Physical Exam General: NAD HEENT: No swelling of the lips, tongue or face. Airway is well visualized and clear Cardiovascular: regular rate and rhythm Pulmonary: clear ant fields Abdomen: soft, nontender, + bowel sounds, no hepatomegaly  Extremities: no edema, no joint deformities Skin: Scattered mild flat erythematic rash of back and arms as shown below. No jaundice  Neurological: No weakness      Assessment and Plan- Patient is a 67 y.o. male    Encounter Diagnosis  Name Primary?   Rash Yes    Rash and pruritus are new. Possibly secondary to chemotherapy but also could be due to his  many exposures to products/scents that can cause generalized dermatitis. Liver enzymes are up slightly but not likely related. Potassium is low secondary to his missed medication doses. He will go home and take his missed potassium supplements. He will try to take his supplements as directed to avoid hypokalemia. He will also take a pepcid tonight as well as benadryl for relief until he can start his medrol in the morning. Discussed how to take along with common potential side effects and precautions. We will see him back on Friday to recheck his LFTs and potassium level. I will schedule time for a potassium infusion if needed. In the meantime he will avoid scented products and alert [place] if itching worsens.   Also alerting his medical oncologist regarding his symptoms.    Patient expressed understanding and was in agreement with this plan. He also understands that He can call clinic at any time with any questions, concerns, or complaints.   Thank you for allowing me to participate in the care of this very pleasant patient.   Time Total: 25  Visit consisted of counseling and education dealing with the complex and emotionally intense issues of symptom management in the setting of serious illness.Greater than 50%  of this time was spent counseling and coordinating care related to the above  assessment and plan.  Signed by: Clent Jacks, PA-C

## 2022-12-18 NOTE — Telephone Encounter (Signed)
Patient called reporting that for the past 3-5 days he has had itching getting progressively worse to the point that he is now itching all over his body and was unable to sleep last night He describes it as a tingling sensation when he touches his skin. He denies having a rash or hives. He took a benadryl last evening, but it did not stop it. Please advise. His last chemotherapy treatment was 5/13. Please advise

## 2022-12-19 NOTE — Telephone Encounter (Signed)
Patient was seen in [facility] to address the issue of itching on 5/22

## 2022-12-20 ENCOUNTER — Inpatient Hospital Stay: Payer: No Typology Code available for payment source

## 2022-12-20 ENCOUNTER — Inpatient Hospital Stay (HOSPITAL_BASED_OUTPATIENT_CLINIC_OR_DEPARTMENT_OTHER): Payer: No Typology Code available for payment source | Admitting: Medical Oncology

## 2022-12-20 ENCOUNTER — Other Ambulatory Visit: Payer: No Typology Code available for payment source

## 2022-12-20 ENCOUNTER — Encounter: Payer: Self-pay | Admitting: Medical Oncology

## 2022-12-20 VITALS — BP 123/76 | HR 84 | Temp 98.1°F | Ht 69.0 in | Wt 237.0 lb

## 2022-12-20 DIAGNOSIS — R21 Rash and other nonspecific skin eruption: Secondary | ICD-10-CM

## 2022-12-20 DIAGNOSIS — Z5111 Encounter for antineoplastic chemotherapy: Secondary | ICD-10-CM | POA: Diagnosis not present

## 2022-12-20 DIAGNOSIS — E876 Hypokalemia: Secondary | ICD-10-CM

## 2022-12-20 LAB — COMPREHENSIVE METABOLIC PANEL
ALT: 47 U/L — ABNORMAL HIGH (ref 0–44)
AST: 41 U/L (ref 15–41)
Albumin: 3.8 g/dL (ref 3.5–5.0)
Alkaline Phosphatase: 54 U/L (ref 38–126)
Anion gap: 10 (ref 5–15)
BUN: 12 mg/dL (ref 8–23)
CO2: 24 mmol/L (ref 22–32)
Calcium: 8.9 mg/dL (ref 8.9–10.3)
Chloride: 105 mmol/L (ref 98–111)
Creatinine, Ser: 0.95 mg/dL (ref 0.61–1.24)
GFR, Estimated: >60 mL/min (ref >60)
Glucose, Bld: 137 mg/dL — ABNORMAL HIGH (ref 70–99)
Potassium: 3.2 mmol/L — ABNORMAL LOW (ref 3.5–5.1)
Sodium: 139 mmol/L (ref 135–145)
Total Bilirubin: 0.3 mg/dL (ref 0.3–1.2)
Total Protein: 7.3 g/dL (ref 6.5–8.1)

## 2022-12-20 LAB — MAGNESIUM: Magnesium: 2.1 mg/dL (ref 1.7–2.4)

## 2022-12-20 MED ORDER — SODIUM CHLORIDE 0.9% FLUSH
10.0000 mL | INTRAVENOUS | Status: DC | PRN
  Administered 2022-12-20: 10 mL via INTRAVENOUS
  Filled 2022-12-20: qty 10

## 2022-12-20 MED ORDER — HEPARIN SOD (PORK) LOCK FLUSH 100 UNIT/ML IV SOLN
500.0000 [IU] | Freq: Once | INTRAVENOUS | Status: AC
  Administered 2022-12-20: 500 [IU] via INTRAVENOUS
  Filled 2022-12-20: qty 5

## 2022-12-20 MED FILL — Dexamethasone Sodium Phosphate Inj 100 MG/10ML: INTRAMUSCULAR | Qty: 1 | Status: AC

## 2022-12-20 NOTE — Progress Notes (Signed)
[facility] at [facility] Telephone:(336) [phone] Fax:(336) [phone]  Patient Care Team: [name], MD as PCP - General (Family Medicine) [name], RN as Oncology Nurse Navigator   Name of the patient: Jonathan [name]  [id number]  09-May-1956   Oncological History: Stage IVB esophageal cancer  Current Treatment: Folfox q14 days x 6 cycles s/p cycle 4 on 05/13/20204  Date of visit: 12/20/22  Reason for Consult: Jonathan [name] is a 67 y.o. male who presents today to follow up for:  Itching: Started on 12/11/2022 after his chemotherapy pump was removed. Started on his neck and moved to his back. Of note he did start using a new body wash, new laundry scent crystals and took a bath with scented products around this same time. At his [facility] visit on 12/18/2022 we elected to start him on a medrol dose pack. During evaluation of his labs from this encounter he was found to have hypokalemia secondary to forgetting to take his potassium supplement. He stated that he would take his supplement as soon as he got home and that he would try to avoid missing any doses.   Today he reports that he is feeling much better. He is tolerating the medrol dose pack well. Itching has fully subsided. Still has a little bit of redness of his back but thinks this is from all the use of a back scratcher when it was itchy. No worsening symptoms. No fevers, SOB, facial swelling. He also reports that he has been taking his potassium supplement as prescribed.    PAST MEDICAL HISTORY: Past Medical History:  Diagnosis Date   Arthritis    Complication of anesthesia    OCCURRED ONCE YEARS AGO 1981   Esophageal mass    Hypertension    Hypothyroidism    PONV (postoperative nausea and vomiting)     PAST SURGICAL HISTORY:  Past Surgical History:  Procedure Laterality Date   COLONOSCOPY     ESOPHAGOGASTRODUODENOSCOPY N/A 08/23/2022   Procedure: ESOPHAGOGASTRODUODENOSCOPY (EGD);   Surgeon: Jonathan Collins, DO;  Location: [facility] ENDOSCOPY;  Service: Gastroenterology;  Laterality: N/A;   EUS N/A 09/12/2022   Procedure: FULL UPPER ENDOSCOPIC ULTRASOUND (EUS) RADIAL;  Surgeon: Jonathan Mulberry, MD;  Location: [facility] ENDOSCOPY;  Service: Gastroenterology;  Laterality: N/A;   FINGER SURGERY     PORTA CATH INSERTION N/A 10/17/2022   Procedure: PORTA CATH INSERTION;  Surgeon: Jonathan Needy, MD;  Location: [facility] INVASIVE CV LAB;  Service: Cardiovascular;  Laterality: N/A;   TENDON REPAIR IN LEFT KNEE      HEMATOLOGY/ONCOLOGY HISTORY:  Oncology History  Adenocarcinoma of esophagus (HCC)  08/28/2022 Initial Diagnosis   Adenocarcinoma of esophagus   -Patient has noticed worsening of "food stuck/fullness" sensation since November 2023.  Patient had a barium swallow study which commented on marked mucosal irregularity in the distal esophagus with Broaddus base mural filling defect highly suspicious for malignancy.  Patient establish care with gastroenterology. -08/23/2022, EGD showed gastritis and partially obstructing malignant esophageal tumor in the lower third of the esophagus. Esophagus mass biopsy showed adenocarcinoma.  PD-L1 TPS 65%, HER2 negative.  [facility] NGS showed KRASG12V, CDKN2A, ARID1A, TP53, TMB 5.3, MSI stable.  Stomach biopsy showed gastric mucosa with no specific histology abnormality.  No significant intestinal metaplastic, dysplastic, granular atrophy or increased inflammation.     08/28/2022 Imaging   CT chest abdomen pelvis with contrast showed 1. Distal esophageal primary with gastrohepatic ligament nodal metastasis. 2. 2 right-sided pulmonary nodules, the  largest of which measures 5 mm and is new since 2015. Pulmonary metastasis not be excluded. 3. Anterior right lower lobe volume loss and minimal soft tissue density, favoring atelectasis or scar. Recommend attention on follow-up. 4. Hepatic steatosis 5. Cholelithiasis 6. Left nephrolithiasis 7.  Coronary artery atherosclerosis. Aortic Atherosclerosis   08/28/2022 Cancer Staging   Staging form: Esophagus - Adenocarcinoma, AJCC 8th Edition - Clinical stage from 08/28/2022: Stage IVB (cT3, cN1, cM1) - Signed by Rickard Patience, MD on 10/04/2022 Stage prefix: Initial diagnosis   09/06/2022 Imaging   PET scan showed 1. Esophageal primary with gastrohepatic ligament nodal metastasis,as on CT. 2. Focus of hypermetabolism which is favored to registered to the posterior hepatic dome, in the region of subtle heterogeneity on prior diagnostic CT. Suboptimally evaluated secondary to underlying steatosis. Recommend further evaluation with pre and post contrast abdominal MRI to confirm probable metastasis. 3. Incidental findings, including: Left nephrolithiasis.Cholelithiasis. Coronary artery atherosclerosis. Aortic Atherosclerosis    09/14/2022 - 09/14/2022 Chemotherapy   Patient is on Treatment Plan : ESOPHAGUS Carboplatin + Paclitaxel Weekly X 6 Weeks with XRT     09/23/2022 Imaging   MRI abdomen with and without contrast showed 1. Mildly T2 hyperintense segment VII hepatic lesion measuring 2.7 cm with imaging characteristics compatible with metastatic disease. 2. Tiny focus of delayed enhancement in the inferior right lobe of the liver segment VI measuring 8 mm with ill-defined increased T2 signal and subtle corresponding reduced diffusivity, also suspicious for metastatic disease. 3. Partially visualized distal esophageal wall thickening compatible with the patient's known primary neoplasm. 4. Similar size of the 11 mm gastrohepatic ligament lymph node mildly metabolic on prior PET-CT and compatible with local nodal disease involvement. 5. 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 Recommend follow up pre and post-contrast MRI/MRCP in 1 year. 6. Diffuse hepatic steatosis.   10/10/2022 Procedure   LIVER MASS; CT-GUIDED  BIOPSY:  - MODERATE TO POORLY DIFFERENTIATED ADENOCARCINOMA MORPHOLOGICALLY  CONSISTENT WITH METASTASIS FROM PATIENT'S KNOWN ESOPHAGEAL  ADENOCARCINOMA.    10/17/2022 Procedure   Medi port placed by Dr. Wyn Quaker   10/21/2022 -  Chemotherapy   Patient is on Treatment Plan : ESOPHAGEAL ADENOCARCINOMA FOLFOX q14d x 6 cycles       ALLERGIES:  has No Known Allergies.  MEDICATIONS:  Current Outpatient Medications  Medication Sig Dispense Refill   acetaminophen (TYLENOL) 650 MG CR tablet Take 1,300 mg by mouth every 8 (eight) hours as needed for pain.     amLODipine (NORVASC) 5 MG tablet Take 5 mg by mouth daily.     apixaban (ELIQUIS) 5 MG TABS tablet Take 1 tablet (5 mg total) by mouth 2 (two) times daily. 60 tablet 1   APIXABAN (ELIQUIS) VTE STARTER PACK (10MG  AND 5MG ) Take as directed on package: start with two-5mg  tablets twice daily for 7 days. On day 8, switch to one-5mg  tablet twice daily. 1 each 0   atenolol (TENORMIN) 50 MG tablet Take 50 mg by mouth daily.     chlorhexidine (PERIDEX) 0.12 % solution Use as directed 15 mLs in the mouth or throat 2 (two) times daily. 473 mL 1   Cholecalciferol (VITAMIN D3) 125 MCG (5000 UT) CAPS Take 5,000 Units by mouth daily.     hydrochlorothiazide (HYDRODIURIL) 25 MG tablet Take 25 mg by mouth daily.     levothyroxine (SYNTHROID) 100 MCG tablet Take 100 mcg by mouth daily before breakfast.  lidocaine-prilocaine (EMLA) cream Apply to affected area once 30 g 6   methylPREDNISolone (MEDROL DOSEPAK) 4 MG TBPK tablet Take by mouth as directed 1 each 0   Multiple Vitamins-Minerals (MULTIVITAMIN WITH MINERALS) tablet Take 1 tablet by mouth daily.     omeprazole (PRILOSEC) 20 MG capsule Take 20 mg by mouth daily.     ondansetron (ZOFRAN) 8 MG tablet Take 1 tablet (8 mg total) by mouth every 8 (eight) hours as needed for nausea or vomiting. Start on the third day after chemotherapy. 30 tablet 1   potassium chloride SA (KLOR-CON M) 20 MEQ tablet Take 1 tablet  (20 mEq total) by mouth 3 (three) times daily. 60 tablet 0   prochlorperazine (COMPAZINE) 10 MG tablet Take 1 tablet (10 mg total) by mouth every 6 (six) hours as needed for nausea or vomiting. 30 tablet 1   zinc gluconate 50 MG tablet Take 50 mg by mouth daily.     No current facility-administered medications for this visit.   Facility-Administered Medications Ordered in Other Visits  Medication Dose Route Frequency Provider Last Rate Last Admin   sodium chloride flush (NS) 0.9 % injection 10 mL  10 mL Intravenous PRN Clent Jacks M, PA-C   10 mL at 12/20/22 1415    VITAL SIGNS: BP 123/76 (BP Location: Left Arm, Patient Position: Sitting)   Pulse 84   Temp 98.1 F (36.7 C) (Tympanic)   Ht 5\' 9"  (1.753 m)   Wt 237 lb (107.5 kg)   SpO2 99%   BMI 35.00 kg/m  Filed Weights   12/20/22 1325  Weight: 237 lb (107.5 kg)    Estimated body mass index is 35 kg/m as calculated from the following:   Height as of this encounter: 5\' 9"  (1.753 m).   Weight as of this encounter: 237 lb (107.5 kg).  LABS: CBC:    Component Value Date/Time   WBC 2.5 (L) 12/18/2022 1432   HGB 12.0 (L) 12/18/2022 1432   HGB 11.6 (L) 12/09/2022 0806   HCT 36.4 (L) 12/18/2022 1432   PLT 170 12/18/2022 1432   PLT 136 (L) 12/09/2022 0806   MCV 82.7 12/18/2022 1432   NEUTROABS 1.3 (L) 12/18/2022 1432   LYMPHSABS 0.7 12/18/2022 1432   MONOABS 0.3 12/18/2022 1432   EOSABS 0.1 12/18/2022 1432   BASOSABS 0.0 12/18/2022 1432   Comprehensive Metabolic Panel:    Component Value Date/Time   NA 139 12/20/2022 1317   K 3.2 (L) 12/20/2022 1317   CL 105 12/20/2022 1317   CO2 24 12/20/2022 1317   BUN 12 12/20/2022 1317   CREATININE 0.95 12/20/2022 1317   CREATININE 1.00 12/09/2022 0806   GLUCOSE 137 (H) 12/20/2022 1317   CALCIUM 8.9 12/20/2022 1317   AST 41 12/20/2022 1317   AST 31 12/09/2022 0806   ALT 47 (H) 12/20/2022 1317   ALT 31 12/09/2022 0806   ALKPHOS 54 12/20/2022 1317   BILITOT 0.3 12/20/2022  1317   BILITOT 0.7 12/09/2022 0806   PROT 7.3 12/20/2022 1317   ALBUMIN 3.8 12/20/2022 1317    PERFORMANCE STATUS (ECOG) : 1 - Symptomatic but completely ambulatory  Review of Systems Unless otherwise noted, a complete review of systems is negative.  Physical Exam General: NAD HEENT: No swelling of the lips, tongue or face. Airway is well visualized and clear Cardiovascular: regular rate and rhythm Pulmonary: clear ant fields Abdomen: soft, nontender, + bowel sounds, no hepatomegaly  Extremities: no edema, no joint deformities Skin: Scattered mild  flat erythematic rash of back and arms  Neurological: No weakness   Assessment and Plan- Patient is a 67 y.o. male    Encounter Diagnosis  Name Primary?   Rash Yes    His rash has symptomatically improved on the Medrol which I am happy about. He is tolerating this well. We will need to watch closely if this rash returns with his next round of chemotherapy. Not likely as we suspect is due to contact dermatitis. For now he will continue his medrol dose pack  His hypokalemia has also improved. He will continue his oral supplementation regimen   Patient expressed understanding and was in agreement with this plan. He also understands that He can call clinic at any time with any questions, concerns, or complaints.   Thank you for allowing me to participate in the care of this very pleasant patient.   Time Total: 15  Visit consisted of counseling and education dealing with the complex and emotionally intense issues of symptom management in the setting of serious illness.Greater than 50%  of this time was spent counseling and coordinating care related to the above assessment and plan.  Signed by: Clent Jacks, PA-C

## 2022-12-21 DIAGNOSIS — C159 Malignant neoplasm of esophagus, unspecified: Secondary | ICD-10-CM | POA: Diagnosis not present

## 2022-12-24 ENCOUNTER — Inpatient Hospital Stay (HOSPITAL_BASED_OUTPATIENT_CLINIC_OR_DEPARTMENT_OTHER): Payer: No Typology Code available for payment source | Admitting: Oncology

## 2022-12-24 ENCOUNTER — Inpatient Hospital Stay: Payer: No Typology Code available for payment source

## 2022-12-24 ENCOUNTER — Encounter: Payer: Self-pay | Admitting: Oncology

## 2022-12-24 VITALS — BP 121/80 | HR 77 | Temp 96.6°F | Resp 18 | Wt 235.9 lb

## 2022-12-24 DIAGNOSIS — E876 Hypokalemia: Secondary | ICD-10-CM

## 2022-12-24 DIAGNOSIS — Z5111 Encounter for antineoplastic chemotherapy: Secondary | ICD-10-CM

## 2022-12-24 DIAGNOSIS — C159 Malignant neoplasm of esophagus, unspecified: Secondary | ICD-10-CM | POA: Diagnosis not present

## 2022-12-24 DIAGNOSIS — I82621 Acute embolism and thrombosis of deep veins of right upper extremity: Secondary | ICD-10-CM | POA: Diagnosis not present

## 2022-12-24 DIAGNOSIS — D701 Agranulocytosis secondary to cancer chemotherapy: Secondary | ICD-10-CM

## 2022-12-24 DIAGNOSIS — T451X5A Adverse effect of antineoplastic and immunosuppressive drugs, initial encounter: Secondary | ICD-10-CM | POA: Diagnosis not present

## 2022-12-24 LAB — CMP (CANCER CENTER ONLY)
ALT: 47 U/L — ABNORMAL HIGH (ref 0–44)
AST: 37 U/L (ref 15–41)
Albumin: 3.7 g/dL (ref 3.5–5.0)
Alkaline Phosphatase: 56 U/L (ref 38–126)
Anion gap: 11 (ref 5–15)
BUN: 11 mg/dL (ref 8–23)
CO2: 25 mmol/L (ref 22–32)
Calcium: 9.3 mg/dL (ref 8.9–10.3)
Chloride: 102 mmol/L (ref 98–111)
Creatinine: 0.84 mg/dL (ref 0.61–1.24)
GFR, Estimated: >60 mL/min (ref >60)
Glucose, Bld: 164 mg/dL — ABNORMAL HIGH (ref 70–99)
Potassium: 3.3 mmol/L — ABNORMAL LOW (ref 3.5–5.1)
Sodium: 138 mmol/L (ref 135–145)
Total Bilirubin: 0.6 mg/dL (ref 0.3–1.2)
Total Protein: 7.3 g/dL (ref 6.5–8.1)

## 2022-12-24 LAB — CBC WITH DIFFERENTIAL (CANCER CENTER ONLY)
Abs Immature Granulocytes: 0 10*3/uL (ref 0.00–0.07)
Basophils Absolute: 0 10*3/uL (ref 0.0–0.1)
Basophils Relative: 1 %
Eosinophils Absolute: 0 10*3/uL (ref 0.0–0.5)
Eosinophils Relative: 2 %
HCT: 39.1 % (ref 39.0–52.0)
Hemoglobin: 12.4 g/dL — ABNORMAL LOW (ref 13.0–17.0)
Immature Granulocytes: 0 %
Lymphocytes Relative: 41 %
Lymphs Abs: 0.8 10*3/uL (ref 0.7–4.0)
MCH: 27.3 pg (ref 26.0–34.0)
MCHC: 31.7 g/dL (ref 30.0–36.0)
MCV: 86.1 fL (ref 80.0–100.0)
Monocytes Absolute: 0.6 10*3/uL (ref 0.1–1.0)
Monocytes Relative: 27 %
Neutro Abs: 0.6 10*3/uL — ABNORMAL LOW (ref 1.7–7.7)
Neutrophils Relative %: 29 %
Platelet Count: 242 10*3/uL (ref 150–400)
RBC: 4.54 MIL/uL (ref 4.22–5.81)
RDW: 20.9 % — ABNORMAL HIGH (ref 11.5–15.5)
WBC Count: 2 10*3/uL — ABNORMAL LOW (ref 4.0–10.5)
nRBC: 0 % (ref 0.0–0.2)

## 2022-12-24 MED ORDER — HEPARIN SOD (PORK) LOCK FLUSH 100 UNIT/ML IV SOLN
500.0000 [IU] | Freq: Once | INTRAVENOUS | Status: AC
  Administered 2022-12-24: 500 [IU] via INTRAVENOUS
  Filled 2022-12-24: qty 5

## 2022-12-24 NOTE — Assessment & Plan Note (Addendum)
Stage IV esophageal adenocarcinoma, liver metastatic disease I recommend systematic palliative chemotherapy with FOLFOX [Oxaliplatin 75mg /m2 while on RT] .  Labs are reviewed and discussed with patient. Hold chemotherapy.

## 2022-12-24 NOTE — Progress Notes (Signed)
Hematology/Oncology Progress note [phone] [phone]        REFERRING PROVIDER: Rickard Patience, MD  CHIEF COMPLAINTS/PURPOSE OF CONSULTATION:  Stage IV Esophageal adenocarcinoma  ASSESSMENT & PLAN:   Cancer Staging  Adenocarcinoma of esophagus [facility]) Staging form: Esophagus - Adenocarcinoma, AJCC 8th Edition - Clinical stage from 08/28/2022: Stage IVB (cT3, cN1, cM1) - Signed by Rickard Patience, MD on 10/04/2022   Adenocarcinoma of esophagus (HCC) Stage IV esophageal adenocarcinoma, liver metastatic disease I recommend systematic palliative chemotherapy with FOLFOX [Oxaliplatin 75mg /m2 while on RT] .  Labs are reviewed and discussed with patient. Proceed with FOLFOX   Encounter for antineoplastic chemotherapy Chemotherapy plan as listed above.    No orders of the defined types were placed in this encounter.  Follow-up  1 week lab MD FOLFOX D3 pump dc  All questions were answered. The patient knows to call the clinic with any problems, questions or concerns.  Rickard Patience, MD, PhD [facility] Hematology Oncology 12/24/2022   HISTORY OF PRESENTING ILLNESS:  Jonathan [name] 67 y.o. male presents to establish care for esophageal adenocarcinoma I have reviewed his chart and materials related to his cancer extensively and collaborated history with the patient. Summary of oncologic history is as follows: Oncology History  Adenocarcinoma of esophagus (HCC)  08/28/2022 Initial Diagnosis   Adenocarcinoma of esophagus   -Patient has noticed worsening of "food stuck/fullness" sensation since November 2023.  Patient had a barium swallow study which commented on marked mucosal irregularity in the distal esophagus with Broaddus base mural filling defect highly suspicious for malignancy.  Patient establish care with gastroenterology. -08/23/2022, EGD showed gastritis and partially obstructing malignant esophageal tumor in the lower third of the esophagus. Esophagus mass biopsy  showed adenocarcinoma.  PD-L1 TPS 65%, HER2 negative.  [facility] NGS showed KRASG12V, CDKN2A, ARID1A, TP53, TMB 5.3, MSI stable.  Stomach biopsy showed gastric mucosa with no specific histology abnormality.  No significant intestinal metaplastic, dysplastic, granular atrophy or increased inflammation.     08/28/2022 Imaging   CT chest abdomen pelvis with contrast showed 1. Distal esophageal primary with gastrohepatic ligament nodal metastasis. 2. 2 right-sided pulmonary nodules, the largest of which measures 5 mm and is new since 2015. Pulmonary metastasis not be excluded. 3. Anterior right lower lobe volume loss and minimal soft tissue density, favoring atelectasis or scar. Recommend attention on follow-up. 4. Hepatic steatosis 5. Cholelithiasis 6. Left nephrolithiasis 7. Coronary artery atherosclerosis. Aortic Atherosclerosis   08/28/2022 Cancer Staging   Staging form: Esophagus - Adenocarcinoma, AJCC 8th Edition - Clinical stage from 08/28/2022: Stage IVB (cT3, cN1, cM1) - Signed by Rickard Patience, MD on 10/04/2022 Stage prefix: Initial diagnosis   09/06/2022 Imaging   PET scan showed 1. Esophageal primary with gastrohepatic ligament nodal metastasis,as on CT. 2. Focus of hypermetabolism which is favored to registered to the posterior hepatic dome, in the region of subtle heterogeneity on prior diagnostic CT. Suboptimally evaluated secondary to underlying steatosis. Recommend further evaluation with pre and post contrast abdominal MRI to confirm probable metastasis. 3. Incidental findings, including: Left nephrolithiasis.Cholelithiasis. Coronary artery atherosclerosis. Aortic Atherosclerosis    09/14/2022 - 09/14/2022 Chemotherapy   Patient is on Treatment Plan : ESOPHAGUS Carboplatin + Paclitaxel Weekly X 6 Weeks with XRT     09/23/2022 Imaging   MRI abdomen with and without contrast showed 1. Mildly T2 hyperintense segment VII hepatic lesion measuring 2.7 cm with imaging characteristics  compatible with metastatic disease. 2. Tiny focus of delayed enhancement in the inferior right lobe  of the liver segment VI measuring 8 mm with ill-defined increased T2 signal and subtle corresponding reduced diffusivity, also suspicious for metastatic disease. 3. Partially visualized distal esophageal wall thickening compatible with the patient's known primary neoplasm. 4. Similar size of the 11 mm gastrohepatic ligament lymph node mildly metabolic on prior PET-CT and compatible with local nodal disease involvement. 5. 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 Recommend follow up pre and post-contrast MRI/MRCP in 1 year. 6. Diffuse hepatic steatosis.   10/10/2022 Procedure   LIVER MASS; CT-GUIDED BIOPSY:  - MODERATE TO POORLY DIFFERENTIATED ADENOCARCINOMA MORPHOLOGICALLY  CONSISTENT WITH METASTASIS FROM PATIENT'S KNOWN ESOPHAGEAL  ADENOCARCINOMA.    10/17/2022 Procedure   Medi port placed by Dr. Wyn Quaker   10/21/2022 -  Chemotherapy   Patient is on Treatment Plan : ESOPHAGEAL ADENOCARCINOMA FOLFOX q14d x 6 cycles     Patient presents to establish care.  He is not taking PPI. He has intentionally lost some weight. Family history positive for father and paternal uncle with prostate cancer and sister with breast cancer. Denies any routine alcohol use  Right interval jugular vein occlusive DVT, started on Eliquis starter package on 10/28/2022, swelling of neck has improved.    INTERVAL HISTORY Jonathan [name] is a 67 y.o. male who has above history reviewed by me today presents for follow up visit for Stage IV esophageal adenocarcinoma.  He tolerated chemotherapy and radiation. Dysphagias has improved.  Nausea after last cycle of chemotherapy, for 2 days, he has to take antiemetics, " one of each kind". No vomiting. He felt better after D3 of treatments.   MEDICAL HISTORY:  Past Medical History:  Diagnosis  Date   Arthritis    Complication of anesthesia    OCCURRED ONCE YEARS AGO 1981   Esophageal mass    Hypertension    Hypothyroidism    PONV (postoperative nausea and vomiting)     SURGICAL HISTORY: Past Surgical History:  Procedure Laterality Date   COLONOSCOPY     ESOPHAGOGASTRODUODENOSCOPY N/A 08/23/2022   Procedure: ESOPHAGOGASTRODUODENOSCOPY (EGD);  Surgeon: Jaynie Collins, DO;  Location: [facility] ENDOSCOPY;  Service: Gastroenterology;  Laterality: N/A;   EUS N/A 09/12/2022   Procedure: FULL UPPER ENDOSCOPIC ULTRASOUND (EUS) RADIAL;  Surgeon: Bearl Mulberry, MD;  Location: [facility] ENDOSCOPY;  Service: Gastroenterology;  Laterality: N/A;   FINGER SURGERY     PORTA CATH INSERTION N/A 10/17/2022   Procedure: PORTA CATH INSERTION;  Surgeon: Annice Needy, MD;  Location: [facility] INVASIVE CV LAB;  Service: Cardiovascular;  Laterality: N/A;   TENDON REPAIR IN LEFT KNEE      SOCIAL HISTORY: Social History   Socioeconomic History   Marital status: Single    Spouse name: Not on file   Number of children: Not on file   Years of education: Not on file   Highest education level: Not on file  Occupational History   Not on file  Tobacco Use   Smoking status: Never   Smokeless tobacco: Never  Vaping Use   Vaping Use: Never used  Substance and Sexual Activity   Alcohol use: Yes    Comment: OCCASIONALLY   Drug use: Never   Sexual activity: Not on file  Other Topics Concern   Not on file  Social History Narrative   Not on file   Social Determinants of Health   Financial Resource Strain: Not on file  Food Insecurity: No Food Insecurity (  08/28/2022)   Hunger Vital Sign    Worried About Running Out of Food in the Last Year: Never true    Ran Out of Food in the Last Year: Never true  Transportation Needs: No Transportation Needs (08/28/2022)   PRAPARE - Administrator, Civil Service (Medical): No    Lack of Transportation (Non-Medical): No  Physical Activity: Not on  file  Stress: Not on file  Social Connections: Not on file  Intimate Partner Violence: Not At Risk (08/28/2022)   Humiliation, Afraid, Rape, and Kick questionnaire    Fear of Current or Ex-Partner: No    Emotionally Abused: No    Physically Abused: No    Sexually Abused: No    FAMILY HISTORY: Family History  Problem Relation Age of Onset   Heart attack Mother    Prostate cancer Father    Breast cancer Sister    Prostate cancer Paternal Uncle     ALLERGIES:  has No Known Allergies.  MEDICATIONS:  Current Outpatient Medications  Medication Sig Dispense Refill   acetaminophen (TYLENOL) 650 MG CR tablet Take 1,300 mg by mouth every 8 (eight) hours as needed for pain.     amLODipine (NORVASC) 5 MG tablet Take 5 mg by mouth daily.     apixaban (ELIQUIS) 5 MG TABS tablet Take 1 tablet (5 mg total) by mouth 2 (two) times daily. 60 tablet 1   APIXABAN (ELIQUIS) VTE STARTER PACK (10MG  AND 5MG ) Take as directed on package: start with two-5mg  tablets twice daily for 7 days. On day 8, switch to one-5mg  tablet twice daily. 1 each 0   atenolol (TENORMIN) 50 MG tablet Take 50 mg by mouth daily.     chlorhexidine (PERIDEX) 0.12 % solution Use as directed 15 mLs in the mouth or throat 2 (two) times daily. 473 mL 1   Cholecalciferol (VITAMIN D3) 125 MCG (5000 UT) CAPS Take 5,000 Units by mouth daily.     hydrochlorothiazide (HYDRODIURIL) 25 MG tablet Take 25 mg by mouth daily.     levothyroxine (SYNTHROID) 100 MCG tablet Take 100 mcg by mouth daily before breakfast.     lidocaine-prilocaine (EMLA) cream Apply to affected area once 30 g 6   Multiple Vitamins-Minerals (MULTIVITAMIN WITH MINERALS) tablet Take 1 tablet by mouth daily.     omeprazole (PRILOSEC) 20 MG capsule Take 20 mg by mouth daily.     ondansetron (ZOFRAN) 8 MG tablet Take 1 tablet (8 mg total) by mouth every 8 (eight) hours as needed for nausea or vomiting. Start on the third day after chemotherapy. 30 tablet 1   potassium chloride  SA (KLOR-CON M) 20 MEQ tablet Take 1 tablet (20 mEq total) by mouth 3 (three) times daily. 60 tablet 0   prochlorperazine (COMPAZINE) 10 MG tablet Take 1 tablet (10 mg total) by mouth every 6 (six) hours as needed for nausea or vomiting. 30 tablet 1   methylPREDNISolone (MEDROL DOSEPAK) 4 MG TBPK tablet Take by mouth as directed (Patient not taking: Reported on 12/24/2022) 1 each 0   No current facility-administered medications for this visit.    Review of Systems  Constitutional:  Negative for appetite change, chills, fatigue and fever.  HENT:   Negative for hearing loss and voice change.   Eyes:  Negative for eye problems and icterus.  Respiratory:  Negative for chest tightness, cough and shortness of breath.   Cardiovascular:  Negative for chest pain and leg swelling.  Gastrointestinal:  Negative for abdominal distention,  abdominal pain, nausea and vomiting.       Dysphagia/epigastric fullness  Endocrine: Negative for hot flashes.  Genitourinary:  Negative for difficulty urinating, dysuria and frequency.   Musculoskeletal:  Negative for arthralgias.  Skin:  Negative for itching and rash.  Neurological:  Negative for light-headedness and numbness.  Hematological:  Negative for adenopathy. Does not bruise/bleed easily.  Psychiatric/Behavioral:  Negative for confusion.      PHYSICAL EXAMINATION: ECOG PERFORMANCE STATUS: 0 - Asymptomatic  Vitals:   12/24/22 0835  BP: 121/80  Pulse: 77  Resp: 18  Temp: (!) 96.6 F (35.9 C)  SpO2: 97%   Filed Weights   12/24/22 0835  Weight: 235 lb 14.4 oz (107 kg)    Physical Exam Constitutional:      General: He is not in acute distress.    Appearance: He is obese. He is not diaphoretic.  HENT:     Head: Normocephalic and atraumatic.     Nose: Nose normal.     Mouth/Throat:     Pharynx: No oropharyngeal exudate.  Eyes:     General: No scleral icterus.    Pupils: Pupils are equal, round, and reactive to light.  Cardiovascular:      Rate and Rhythm: Normal rate and regular rhythm.     Heart sounds: No murmur heard. Pulmonary:     Effort: Pulmonary effort is normal. No respiratory distress.     Breath sounds: No rales.  Chest:     Chest wall: No tenderness.  Abdominal:     General: There is no distension.     Palpations: Abdomen is soft.     Tenderness: There is no abdominal tenderness.  Musculoskeletal:        General: Normal range of motion.     Cervical back: Normal range of motion and neck supple.  Skin:    General: Skin is warm and dry.     Findings: No erythema.  Neurological:     Mental Status: He is alert and oriented to person, place, and time.     Cranial Nerves: No cranial nerve deficit.     Motor: No abnormal muscle tone.     Coordination: Coordination normal.  Psychiatric:        Mood and Affect: Affect normal.      LABORATORY DATA:  I have reviewed the data as listed    Latest Ref Rng & Units 12/24/2022    7:53 AM 12/18/2022    2:32 PM 12/09/2022    8:06 AM  CBC  WBC 4.0 - 10.5 K/uL 2.0  2.5  3.3   Hemoglobin 13.0 - 17.0 g/dL 95.6  21.3  08.6   Hematocrit 39.0 - 52.0 % 39.1  36.4  35.9   Platelets 150 - 400 K/uL 242  170  136       Latest Ref Rng & Units 12/24/2022    7:53 AM 12/20/2022    1:17 PM 12/18/2022    2:32 PM  CMP  Glucose 70 - 99 mg/dL 578  469  629   BUN 8 - 23 mg/dL 11  12  11    Creatinine 0.61 - 1.24 mg/dL 5.28  4.13  2.44   Sodium 135 - 145 mmol/L 138  139  137   Potassium 3.5 - 5.1 mmol/L 3.3  3.2  2.7   Chloride 98 - 111 mmol/L 102  105  101   CO2 22 - 32 mmol/L 25  24  23    Calcium 8.9 - 10.3  mg/dL 9.3  8.9  8.9   Total Protein 6.5 - 8.1 g/dL 7.3  7.3  7.2   Total Bilirubin 0.3 - 1.2 mg/dL 0.6  0.3  0.5   Alkaline Phos 38 - 126 U/L 56  54  55   AST 15 - 41 U/L 37  41  45   ALT 0 - 44 U/L 47  47  58      RADIOGRAPHIC STUDIES: I have personally reviewed the radiological images as listed and agreed with the findings in the report. No results found.

## 2022-12-24 NOTE — Assessment & Plan Note (Signed)
continue potassium supplementation, increase to TID.

## 2022-12-24 NOTE — Assessment & Plan Note (Signed)
Chemotherapy plan as listed above 

## 2022-12-24 NOTE — Assessment & Plan Note (Addendum)
ANC 0.6, he is asymptomatic.  Hold off chemo Discussed about neutropenia precaution.

## 2022-12-24 NOTE — Assessment & Plan Note (Signed)
Continue Eliquis 5mg BID 

## 2022-12-25 LAB — CEA: CEA: 11.1 ng/mL — ABNORMAL HIGH (ref 0.0–4.7)

## 2022-12-26 ENCOUNTER — Inpatient Hospital Stay: Payer: No Typology Code available for payment source

## 2022-12-26 ENCOUNTER — Ambulatory Visit
Admission: RE | Admit: 2022-12-26 | Discharge: 2022-12-26 | Disposition: A | Discharge status: Home / Self Care | Payer: No Typology Code available for payment source | Source: Ambulatory Visit | Attending: Radiation Oncology | Admitting: Radiation Oncology

## 2022-12-26 ENCOUNTER — Other Ambulatory Visit: Payer: Self-pay

## 2022-12-26 ENCOUNTER — Encounter: Payer: Self-pay | Admitting: Radiation Oncology

## 2022-12-26 VITALS — BP 116/74 | HR 75 | Temp 98.3°F | Resp 16 | Ht 69.0 in | Wt 235.4 lb

## 2022-12-26 DIAGNOSIS — C159 Malignant neoplasm of esophagus, unspecified: Secondary | ICD-10-CM

## 2022-12-26 DIAGNOSIS — Z510 Encounter for antineoplastic radiation therapy: Secondary | ICD-10-CM | POA: Diagnosis not present

## 2022-12-26 MED FILL — Dexamethasone Sodium Phosphate Inj 100 MG/10ML: INTRAMUSCULAR | Qty: 1 | Status: AC

## 2022-12-26 NOTE — Progress Notes (Signed)
Radiation Oncology Follow up Note  Name: Jonathan [name]   Date:   12/26/2022 MRN:  [id number] DOB: 03-03-1956    This 67 y.o. male presents to the clinic today for 1 month follow-up status post radiation therapy to his esophagus and patient with known stage IV disease.  REFERRING PROVIDER: [name], MD  HPI: Patient is a 67 year old male now at 1 month having completed radiation therapy to his esophagus and known stage IV adenocarcinoma of the esophagus with liver metastasis.  He underwent palliative radiation therapy and is currently undergoing FOLFOX chemotherapy by medical oncology's direction.  He is doing well having no dysphagia at this time.  Food is no longer sticking.  His weight is stable.  He is tolerating his chemotherapy fairly well..  COMPLICATIONS OF TREATMENT: none  FOLLOW UP COMPLIANCE: keeps appointments   PHYSICAL EXAM:  BP 116/74   Pulse 75   Temp 98.3 F (36.8 C)   Resp 16   Ht 5\' 9"  (1.753 m)   Wt 235 lb 6.4 oz (106.8 kg)   BMI 34.76 kg/m  Well-developed well-nourished patient in NAD. HEENT reveals PERLA, EOMI, discs not visualized.  Oral cavity is clear. No oral mucosal lesions are identified. Neck is clear without evidence of cervical or supraclavicular adenopathy. Lungs are clear to A&P. Cardiac examination is essentially unremarkable with regular rate and rhythm without murmur rub or thrill. Abdomen is benign with no organomegaly or masses noted. Motor sensory and DTR levels are equal and symmetric in the upper and lower extremities. Cranial nerves II through XII are grossly intact. Proprioception is intact. No peripheral adenopathy or edema is identified. No motor or sensory levels are noted. Crude visual fields are within normal range.  RADIOLOGY RESULTS: No current films for review  PLAN: Present time patient is doing well no significant problems with dysphagia.  He currently is undergoing FOLFOX chemotherapy under Dr. Bethanne Ginger direction.  I have asked  to see him back in 4 to 5 months for follow-up.  Should he have a PET CT scan would like to review that at that time.  Patient is to call with any concerns.  I would like to take this opportunity to thank you for allowing me to participate in the care of your patient.Carmina Miller, MD

## 2022-12-27 ENCOUNTER — Other Ambulatory Visit: Payer: Self-pay

## 2022-12-27 MED FILL — Fluorouracil IV Soln 5 GM/100ML (50 MG/ML): INTRAVENOUS | Qty: 110 | Status: AC

## 2022-12-27 MED FILL — Fluorouracil IV Soln 2.5 GM/50ML (50 MG/ML): INTRAVENOUS | Qty: 20 | Status: AC

## 2022-12-30 ENCOUNTER — Telehealth: Payer: Self-pay | Admitting: *Deleted

## 2022-12-30 ENCOUNTER — Inpatient Hospital Stay: Payer: No Typology Code available for payment source

## 2022-12-30 ENCOUNTER — Encounter: Payer: Self-pay | Admitting: Oncology

## 2022-12-30 ENCOUNTER — Inpatient Hospital Stay (HOSPITAL_BASED_OUTPATIENT_CLINIC_OR_DEPARTMENT_OTHER): Payer: No Typology Code available for payment source | Admitting: Oncology

## 2022-12-30 ENCOUNTER — Inpatient Hospital Stay: Payer: No Typology Code available for payment source | Attending: Oncology

## 2022-12-30 VITALS — BP 117/86 | HR 80 | Temp 97.9°F | Resp 18 | Wt 236.7 lb

## 2022-12-30 DIAGNOSIS — Z5111 Encounter for antineoplastic chemotherapy: Secondary | ICD-10-CM | POA: Diagnosis not present

## 2022-12-30 DIAGNOSIS — E876 Hypokalemia: Secondary | ICD-10-CM | POA: Insufficient documentation

## 2022-12-30 DIAGNOSIS — K8020 Calculus of gallbladder without cholecystitis without obstruction: Secondary | ICD-10-CM | POA: Insufficient documentation

## 2022-12-30 DIAGNOSIS — C159 Malignant neoplasm of esophagus, unspecified: Secondary | ICD-10-CM | POA: Diagnosis not present

## 2022-12-30 DIAGNOSIS — N200 Calculus of kidney: Secondary | ICD-10-CM | POA: Insufficient documentation

## 2022-12-30 DIAGNOSIS — Z79899 Other long term (current) drug therapy: Secondary | ICD-10-CM | POA: Diagnosis not present

## 2022-12-30 DIAGNOSIS — Z86718 Personal history of other venous thrombosis and embolism: Secondary | ICD-10-CM | POA: Insufficient documentation

## 2022-12-30 DIAGNOSIS — Z7952 Long term (current) use of systemic steroids: Secondary | ICD-10-CM | POA: Diagnosis not present

## 2022-12-30 DIAGNOSIS — I700 Atherosclerosis of aorta: Secondary | ICD-10-CM | POA: Diagnosis not present

## 2022-12-30 DIAGNOSIS — T451X5A Adverse effect of antineoplastic and immunosuppressive drugs, initial encounter: Secondary | ICD-10-CM | POA: Insufficient documentation

## 2022-12-30 DIAGNOSIS — C155 Malignant neoplasm of lower third of esophagus: Secondary | ICD-10-CM | POA: Insufficient documentation

## 2022-12-30 DIAGNOSIS — I82621 Acute embolism and thrombosis of deep veins of right upper extremity: Secondary | ICD-10-CM

## 2022-12-30 DIAGNOSIS — Z7901 Long term (current) use of anticoagulants: Secondary | ICD-10-CM | POA: Insufficient documentation

## 2022-12-30 DIAGNOSIS — R634 Abnormal weight loss: Secondary | ICD-10-CM | POA: Diagnosis not present

## 2022-12-30 DIAGNOSIS — R1310 Dysphagia, unspecified: Secondary | ICD-10-CM | POA: Insufficient documentation

## 2022-12-30 DIAGNOSIS — Z803 Family history of malignant neoplasm of breast: Secondary | ICD-10-CM | POA: Insufficient documentation

## 2022-12-30 DIAGNOSIS — R1319 Other dysphagia: Secondary | ICD-10-CM

## 2022-12-30 DIAGNOSIS — D701 Agranulocytosis secondary to cancer chemotherapy: Secondary | ICD-10-CM | POA: Insufficient documentation

## 2022-12-30 DIAGNOSIS — I2510 Atherosclerotic heart disease of native coronary artery without angina pectoris: Secondary | ICD-10-CM | POA: Insufficient documentation

## 2022-12-30 DIAGNOSIS — C787 Secondary malignant neoplasm of liver and intrahepatic bile duct: Secondary | ICD-10-CM | POA: Diagnosis not present

## 2022-12-30 LAB — CMP (CANCER CENTER ONLY)
ALT: 38 U/L (ref 0–44)
AST: 34 U/L (ref 15–41)
Albumin: 3.7 g/dL (ref 3.5–5.0)
Alkaline Phosphatase: 59 U/L (ref 38–126)
Anion gap: 10 (ref 5–15)
BUN: 8 mg/dL (ref 8–23)
CO2: 24 mmol/L (ref 22–32)
Calcium: 9 mg/dL (ref 8.9–10.3)
Chloride: 104 mmol/L (ref 98–111)
Creatinine: 1.02 mg/dL (ref 0.61–1.24)
GFR, Estimated: >60 mL/min (ref >60)
Glucose, Bld: 129 mg/dL — ABNORMAL HIGH (ref 70–99)
Potassium: 3.7 mmol/L (ref 3.5–5.1)
Sodium: 138 mmol/L (ref 135–145)
Total Bilirubin: 0.5 mg/dL (ref 0.3–1.2)
Total Protein: 7.6 g/dL (ref 6.5–8.1)

## 2022-12-30 LAB — CBC WITH DIFFERENTIAL (CANCER CENTER ONLY)
Abs Immature Granulocytes: 0.11 10*3/uL — ABNORMAL HIGH (ref 0.00–0.07)
Basophils Absolute: 0.1 10*3/uL (ref 0.0–0.1)
Basophils Relative: 2 %
Eosinophils Absolute: 0.1 10*3/uL (ref 0.0–0.5)
Eosinophils Relative: 2 %
HCT: 40 % (ref 39.0–52.0)
Hemoglobin: 12.9 g/dL — ABNORMAL LOW (ref 13.0–17.0)
Immature Granulocytes: 2 %
Lymphocytes Relative: 18 %
Lymphs Abs: 1 10*3/uL (ref 0.7–4.0)
MCH: 27.5 pg (ref 26.0–34.0)
MCHC: 32.3 g/dL (ref 30.0–36.0)
MCV: 85.3 fL (ref 80.0–100.0)
Monocytes Absolute: 1 10*3/uL (ref 0.1–1.0)
Monocytes Relative: 19 %
Neutro Abs: 3.2 10*3/uL (ref 1.7–7.7)
Neutrophils Relative %: 57 %
Platelet Count: 295 10*3/uL (ref 150–400)
RBC: 4.69 MIL/uL (ref 4.22–5.81)
RDW: 20.1 % — ABNORMAL HIGH (ref 11.5–15.5)
WBC Count: 5.6 10*3/uL (ref 4.0–10.5)
nRBC: 0 % (ref 0.0–0.2)

## 2022-12-30 MED ORDER — DEXTROSE 5 % IV SOLN
Freq: Once | INTRAVENOUS | Status: AC
  Filled 2022-12-30: qty 250

## 2022-12-30 MED ORDER — FLUOROURACIL CHEMO INJECTION 2.5 GM/50ML
400.0000 mg/m2 | Freq: Once | INTRAVENOUS | Status: AC
  Administered 2022-12-30: 1000 mg via INTRAVENOUS
  Filled 2022-12-30: qty 20

## 2022-12-30 MED ORDER — PALONOSETRON HCL INJECTION 0.25 MG/5ML
0.2500 mg | Freq: Once | INTRAVENOUS | Status: AC
  Administered 2022-12-30: 0.25 mg via INTRAVENOUS
  Filled 2022-12-30: qty 5

## 2022-12-30 MED ORDER — SODIUM CHLORIDE 0.9 % IV SOLN
10.0000 mg | Freq: Once | INTRAVENOUS | Status: AC
  Administered 2022-12-30: 10 mg via INTRAVENOUS
  Filled 2022-12-30: qty 1
  Filled 2022-12-30: qty 10

## 2022-12-30 MED ORDER — LEUCOVORIN CALCIUM INJECTION 350 MG
400.0000 mg/m2 | Freq: Once | INTRAVENOUS | Status: AC
  Administered 2022-12-30: 920 mg via INTRAVENOUS
  Filled 2022-12-30: qty 46

## 2022-12-30 MED ORDER — OXALIPLATIN CHEMO INJECTION 100 MG/20ML
75.0000 mg/m2 | Freq: Once | INTRAVENOUS | Status: AC
  Administered 2022-12-30: 175 mg via INTRAVENOUS
  Filled 2022-12-30: qty 35

## 2022-12-30 MED ORDER — SODIUM CHLORIDE 0.9 % IV SOLN
2400.0000 mg/m2 | INTRAVENOUS | Status: DC
  Administered 2022-12-30: 5500 mg via INTRAVENOUS
  Filled 2022-12-30: qty 110

## 2022-12-30 MED ORDER — SODIUM CHLORIDE 0.9% FLUSH
10.0000 mL | INTRAVENOUS | Status: DC | PRN
  Administered 2022-12-30: 10 mL via INTRAVENOUS
  Filled 2022-12-30: qty 10

## 2022-12-30 NOTE — Telephone Encounter (Signed)
Spoke to pharmacy who stated all the patient needed to do was to call and ask for a request. I called informed patient. Thank you

## 2022-12-30 NOTE — Patient Instructions (Signed)
[place] [facility]  Discharge Instructions: Thank you for choosing [facility] to provide your oncology and hematology care.  If you have a lab appointment with the Cancer Center, please go directly to the Cancer Center and check in at the registration area.  Wear comfortable clothing and clothing appropriate for easy access to any Portacath or PICC line.   We strive to give you quality time with your provider. You may need to reschedule your appointment if you arrive late (15 or more minutes).  Arriving late affects you and other patients whose appointments are after yours.  Also, if you miss three or more appointments without notifying the office, you may be dismissed from the clinic at the provider's discretion.      For prescription refill requests, have your pharmacy contact our office and allow 72 hours for refills to be completed.    Today you received the following chemotherapy and/or immunotherapy agents Adrucil, Oxaliplatin and Leucovorin.      To help prevent nausea and vomiting after your treatment, we encourage you to take your nausea medication as directed.  BELOW ARE SYMPTOMS THAT SHOULD BE REPORTED IMMEDIATELY: *FEVER GREATER THAN 100.4 F (38 C) OR HIGHER *CHILLS OR SWEATING *NAUSEA AND VOMITING THAT IS NOT CONTROLLED WITH YOUR NAUSEA MEDICATION *UNUSUAL SHORTNESS OF BREATH *UNUSUAL BRUISING OR BLEEDING *URINARY PROBLEMS (pain or burning when urinating, or frequent urination) *BOWEL PROBLEMS (unusual diarrhea, constipation, pain near the anus) TENDERNESS IN MOUTH AND THROAT WITH OR WITHOUT PRESENCE OF ULCERS (sore throat, sores in mouth, or a toothache) UNUSUAL RASH, SWELLING OR PAIN  UNUSUAL VAGINAL DISCHARGE OR ITCHING   Items with * indicate a potential emergency and should be followed up as soon as possible or go to the Emergency Department if any problems should occur.  Please show the CHEMOTHERAPY ALERT CARD or IMMUNOTHERAPY  ALERT CARD at check-in to the Emergency Department and triage nurse.  Should you have questions after your visit or need to cancel or reschedule your appointment, please contact [place] [facility]  [phone] and follow the prompts.  Office hours are 8:00 a.m. to 4:30 p.m. Monday - Friday. Please note that voicemails left after 4:00 p.m. may not be returned until the following business day.  We are closed weekends and major holidays. You have access to a nurse at all times for urgent questions. Please call the main number to the clinic [phone] and follow the prompts.  For any non-urgent questions, you may also contact your provider using MyChart. We now offer e-Visits for anyone 1 and older to request care online for non-urgent symptoms. For details visit [email or web address].   Also download the MyChart app! Go to the app store, search "MyChart", open the app, select [facility], and log in with your MyChart username and password.

## 2022-12-30 NOTE — Assessment & Plan Note (Addendum)
Continue TID.

## 2022-12-30 NOTE — Telephone Encounter (Signed)
Patient called reporting that [facility] does not have the prescription to take potassium three times a day

## 2022-12-30 NOTE — Assessment & Plan Note (Signed)
ANC has resolved.

## 2022-12-30 NOTE — Assessment & Plan Note (Signed)
Continue follow up with Rad Onc. S/p  palliative radiation.

## 2022-12-30 NOTE — Assessment & Plan Note (Signed)
Continue Eliquis 5mg BID 

## 2022-12-30 NOTE — Assessment & Plan Note (Addendum)
Stage IV esophageal adenocarcinoma, liver metastatic disease I recommend systematic palliative chemotherapy with FOLFOX [Oxaliplatin 75mg /m2 while on RT] .  Labs are reviewed and discussed with patient. Last RT 11/29/2022. Proceed with FOLFOX  Repeat PET scan for evaluation of treatment response.

## 2022-12-30 NOTE — Assessment & Plan Note (Signed)
Chemotherapy plan as listed above 

## 2022-12-30 NOTE — Progress Notes (Signed)
Hematology/Oncology Progress note [phone] [phone]        REFERRING PROVIDER: Rickard Patience, MD  CHIEF COMPLAINTS/PURPOSE OF CONSULTATION:  Stage IV Esophageal adenocarcinoma  ASSESSMENT & PLAN:   Cancer Staging  Adenocarcinoma of esophagus [facility]) Staging form: Esophagus - Adenocarcinoma, AJCC 8th Edition - Clinical stage from 08/28/2022: Stage IVB (cT3, cN1, cM1) - Signed by Rickard Patience, MD on 10/04/2022   Adenocarcinoma of esophagus (HCC) Stage IV esophageal adenocarcinoma, liver metastatic disease I recommend systematic palliative chemotherapy with FOLFOX [Oxaliplatin 75mg /m2 while on RT] .  Labs are reviewed and discussed with patient. Last RT 11/29/2022. Proceed with FOLFOX  Repeat PET scan for evaluation of treatment response.  Hypokalemia Continue TID.    Encounter for antineoplastic chemotherapy Chemotherapy plan as listed above.   Dysphagia Continue follow up with Rad Onc. S/p  palliative radiation.   Deep venous thrombosis (HCC) Continue Eliquis 5mg  BID   Chemotherapy induced neutropenia (HCC) ANC has resolved.     Orders Placed This Encounter  Procedures   NM PET Image Restage (PS) Skull Base to Thigh (F-18 FDG)    Standing Status:   Future    Standing Expiration Date:   12/30/2023    Order Specific Question:   If indicated for the ordered procedure, I authorize the administration of a radiopharmaceutical per Radiology protocol    Answer:   Yes    Order Specific Question:   Preferred imaging location?    Answer:   [facility]    Order Specific Question:   Radiology Contrast Protocol - do NOT remove file path    Answer:   [email or web address]    Follow-up  1 week lab MD FOLFOX D3 pump dc  All questions were answered. The patient knows to call the clinic with any problems, questions or concerns.  Rickard Patience, MD, PhD [facility] Hematology Oncology 12/30/2022   HISTORY OF PRESENTING  ILLNESS:  Jonathan [name] 67 y.o. male presents to establish care for esophageal adenocarcinoma I have reviewed his chart and materials related to his cancer extensively and collaborated history with the patient. Summary of oncologic history is as follows: Oncology History  Adenocarcinoma of esophagus (HCC)  08/28/2022 Initial Diagnosis   Adenocarcinoma of esophagus   -Patient has noticed worsening of "food stuck/fullness" sensation since November 2023.  Patient had a barium swallow study which commented on marked mucosal irregularity in the distal esophagus with Broaddus base mural filling defect highly suspicious for malignancy.  Patient establish care with gastroenterology. -08/23/2022, EGD showed gastritis and partially obstructing malignant esophageal tumor in the lower third of the esophagus. Esophagus mass biopsy showed adenocarcinoma.  PD-L1 TPS 65%, HER2 negative.  [facility] NGS showed KRASG12V, CDKN2A, ARID1A, TP53, TMB 5.3, MSI stable.  Stomach biopsy showed gastric mucosa with no specific histology abnormality.  No significant intestinal metaplastic, dysplastic, granular atrophy or increased inflammation.     08/28/2022 Imaging   CT chest abdomen pelvis with contrast showed 1. Distal esophageal primary with gastrohepatic ligament nodal metastasis. 2. 2 right-sided pulmonary nodules, the largest of which measures 5 mm and is new since 2015. Pulmonary metastasis not be excluded. 3. Anterior right lower lobe volume loss and minimal soft tissue density, favoring atelectasis or scar. Recommend attention on follow-up. 4. Hepatic steatosis 5. Cholelithiasis 6. Left nephrolithiasis 7. Coronary artery atherosclerosis. Aortic Atherosclerosis   08/28/2022 Cancer Staging   Staging form: Esophagus - Adenocarcinoma, AJCC 8th Edition - Clinical stage from 08/28/2022: Stage IVB (cT3, cN1, cM1) - Signed by Cathie Hoops,  Janyth Contes, MD on 10/04/2022 Stage prefix: Initial diagnosis   09/06/2022 Imaging   PET scan  showed 1. Esophageal primary with gastrohepatic ligament nodal metastasis,as on CT. 2. Focus of hypermetabolism which is favored to registered to the posterior hepatic dome, in the region of subtle heterogeneity on prior diagnostic CT. Suboptimally evaluated secondary to underlying steatosis. Recommend further evaluation with pre and post contrast abdominal MRI to confirm probable metastasis. 3. Incidental findings, including: Left nephrolithiasis.Cholelithiasis. Coronary artery atherosclerosis. Aortic Atherosclerosis    09/14/2022 - 09/14/2022 Chemotherapy   Patient is on Treatment Plan : ESOPHAGUS Carboplatin + Paclitaxel Weekly X 6 Weeks with XRT     09/23/2022 Imaging   MRI abdomen with and without contrast showed 1. Mildly T2 hyperintense segment VII hepatic lesion measuring 2.7 cm with imaging characteristics compatible with metastatic disease. 2. Tiny focus of delayed enhancement in the inferior right lobe of the liver segment VI measuring 8 mm with ill-defined increased T2 signal and subtle corresponding reduced diffusivity, also suspicious for metastatic disease. 3. Partially visualized distal esophageal wall thickening compatible with the patient's known primary neoplasm. 4. Similar size of the 11 mm gastrohepatic ligament lymph node mildly metabolic on prior PET-CT and compatible with local nodal disease involvement. 5. 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 Recommend follow up pre and post-contrast MRI/MRCP in 1 year. 6. Diffuse hepatic steatosis.   10/10/2022 Procedure   LIVER MASS; CT-GUIDED BIOPSY:  - MODERATE TO POORLY DIFFERENTIATED ADENOCARCINOMA MORPHOLOGICALLY  CONSISTENT WITH METASTASIS FROM PATIENT'S KNOWN ESOPHAGEAL  ADENOCARCINOMA.    10/17/2022 Procedure   Medi port placed by Dr. Wyn Quaker   10/21/2022 -  Chemotherapy   Patient is on Treatment Plan : ESOPHAGEAL ADENOCARCINOMA FOLFOX  q14d x 6 cycles     Patient presents to establish care.  He is not taking PPI. He has intentionally lost some weight. Family history positive for father and paternal uncle with prostate cancer and sister with breast cancer. Denies any routine alcohol use  Right interval jugular vein occlusive DVT, started on Eliquis starter package on 10/28/2022, swelling of neck has improved.    INTERVAL HISTORY Jonathan [name] is a 67 y.o. male who has above history reviewed by me today presents for follow up visit for Stage IV esophageal adenocarcinoma.  He tolerated chemotherapy and radiation. Dysphagias has improved.  Nausea after last cycle of chemotherapy, for 2 days, he has to take antiemetics, " one of each kind". No vomiting. He felt better after D3 of treatments.   MEDICAL HISTORY:  Past Medical History:  Diagnosis Date   Arthritis    Complication of anesthesia    OCCURRED ONCE YEARS AGO 1981   Esophageal mass    Hypertension    Hypothyroidism    PONV (postoperative nausea and vomiting)     SURGICAL HISTORY: Past Surgical History:  Procedure Laterality Date   COLONOSCOPY     ESOPHAGOGASTRODUODENOSCOPY N/A 08/23/2022   Procedure: ESOPHAGOGASTRODUODENOSCOPY (EGD);  Surgeon: Jaynie Collins, DO;  [facility] ENDOSCOPY;  Service: Gastroenterology;  Laterality: N/A;   EUS N/A 09/12/2022   Procedure: FULL UPPER ENDOSCOPIC ULTRASOUND (EUS) RADIAL;  Surgeon: Bearl Mulberry, MD;  [facility];  Service: Gastroenterology;  Laterality: N/A;   FINGER SURGERY     PORTA CATH INSERTION N/A 10/17/2022   Procedure: PORTA CATH INSERTION;  Surgeon: Annice Needy, MD;  Location: [facility] INVASIVE CV LAB;  Service: Cardiovascular;  Laterality: N/A;  TENDON REPAIR IN LEFT KNEE      SOCIAL HISTORY: Social History   Socioeconomic History   Marital status: Single    Spouse name: Not on file   Number of children: Not on file   Years of education: Not on file   Highest education  level: Not on file  Occupational History   Not on file  Tobacco Use   Smoking status: Never   Smokeless tobacco: Never  Vaping Use   Vaping Use: Never used  Substance and Sexual Activity   Alcohol use: Yes    Comment: OCCASIONALLY   Drug use: Never   Sexual activity: Not on file  Other Topics Concern   Not on file  Social History Narrative   Not on file   Social Determinants of Health   Financial Resource Strain: Not on file  Food Insecurity: No Food Insecurity (08/28/2022)   Hunger Vital Sign    Worried About Running Out of Food in the Last Year: Never true    Ran Out of Food in the Last Year: Never true  Transportation Needs: No Transportation Needs (08/28/2022)   PRAPARE - Administrator, Civil Service (Medical): No    Lack of Transportation (Non-Medical): No  Physical Activity: Not on file  Stress: Not on file  Social Connections: Not on file  Intimate Partner Violence: Not At Risk (08/28/2022)   Humiliation, Afraid, Rape, and Kick questionnaire    Fear of Current or Ex-Partner: No    Emotionally Abused: No    Physically Abused: No    Sexually Abused: No    FAMILY HISTORY: Family History  Problem Relation Age of Onset   Heart attack Mother    Prostate cancer Father    Breast cancer Sister    Prostate cancer Paternal Uncle     ALLERGIES:  has No Known Allergies.  MEDICATIONS:  Current Outpatient Medications  Medication Sig Dispense Refill   acetaminophen (TYLENOL) 650 MG CR tablet Take 1,300 mg by mouth every 8 (eight) hours as needed for pain.     amLODipine (NORVASC) 10 MG tablet Take 10 mg by mouth daily.     amLODipine (NORVASC) 5 MG tablet Take 5 mg by mouth daily.     apixaban (ELIQUIS) 5 MG TABS tablet Take 1 tablet (5 mg total) by mouth 2 (two) times daily. 60 tablet 1   APIXABAN (ELIQUIS) VTE STARTER PACK (10MG  AND 5MG ) Take as directed on package: start with two-5mg  tablets twice daily for 7 days. On day 8, switch to one-5mg  tablet twice  daily. 1 each 0   atenolol (TENORMIN) 50 MG tablet Take 50 mg by mouth daily.     chlorhexidine (PERIDEX) 0.12 % solution Use as directed 15 mLs in the mouth or throat 2 (two) times daily. 473 mL 1   Cholecalciferol (VITAMIN D3) 125 MCG (5000 UT) CAPS Take 5,000 Units by mouth daily.     hydrochlorothiazide (HYDRODIURIL) 25 MG tablet Take 25 mg by mouth daily.     levothyroxine (SYNTHROID) 100 MCG tablet Take 100 mcg by mouth daily before breakfast.     levothyroxine (SYNTHROID) 125 MCG tablet Take 125 mcg by mouth every morning.     lidocaine-prilocaine (EMLA) cream Apply to affected area once 30 g 6   Multiple Vitamins-Minerals (MULTIVITAMIN WITH MINERALS) tablet Take 1 tablet by mouth daily.     omeprazole (PRILOSEC) 20 MG capsule Take 20 mg by mouth daily.     ondansetron (ZOFRAN) 8 MG tablet  Take 1 tablet (8 mg total) by mouth every 8 (eight) hours as needed for nausea or vomiting. Start on the third day after chemotherapy. 30 tablet 1   potassium chloride SA (KLOR-CON M) 20 MEQ tablet Take 1 tablet (20 mEq total) by mouth 3 (three) times daily. 60 tablet 0   prochlorperazine (COMPAZINE) 10 MG tablet Take 1 tablet (10 mg total) by mouth every 6 (six) hours as needed for nausea or vomiting. 30 tablet 1   methylPREDNISolone (MEDROL DOSEPAK) 4 MG TBPK tablet Take by mouth as directed (Patient not taking: Reported on 12/24/2022) 1 each 0   No current facility-administered medications for this visit.   Facility-Administered Medications Ordered in Other Visits  Medication Dose Route Frequency Provider Last Rate Last Admin   fluorouracil (ADRUCIL) 5,500 mg in sodium chloride 0.9 % 140 mL chemo infusion  2,400 mg/m2 (Order-Specific) Intravenous 1 day or 1 dose Rickard Patience, MD   5,500 mg at 12/30/22 1227    Review of Systems  Constitutional:  Negative for appetite change, chills, fatigue and fever.  HENT:   Negative for hearing loss and voice change.   Eyes:  Negative for eye problems and icterus.   Respiratory:  Negative for chest tightness, cough and shortness of breath.   Cardiovascular:  Negative for chest pain and leg swelling.  Gastrointestinal:  Negative for abdominal distention, abdominal pain, nausea and vomiting.       Dysphagia/epigastric fullness  Endocrine: Negative for hot flashes.  Genitourinary:  Negative for difficulty urinating, dysuria and frequency.   Musculoskeletal:  Negative for arthralgias.  Skin:  Negative for itching and rash.  Neurological:  Negative for light-headedness and numbness.  Hematological:  Negative for adenopathy. Does not bruise/bleed easily.  Psychiatric/Behavioral:  Negative for confusion.      PHYSICAL EXAMINATION: ECOG PERFORMANCE STATUS: 0 - Asymptomatic  Vitals:   12/30/22 0837  BP: 117/86  Pulse: 80  Resp: 18  Temp: 97.9 F (36.6 C)  SpO2: 99%   Filed Weights   12/30/22 0837  Weight: 236 lb 11.2 oz (107.4 kg)    Physical Exam Constitutional:      General: He is not in acute distress.    Appearance: He is obese. He is not diaphoretic.  HENT:     Head: Normocephalic and atraumatic.     Nose: Nose normal.     Mouth/Throat:     Pharynx: No oropharyngeal exudate.  Eyes:     General: No scleral icterus.    Pupils: Pupils are equal, round, and reactive to light.  Cardiovascular:     Rate and Rhythm: Normal rate and regular rhythm.     Heart sounds: No murmur heard. Pulmonary:     Effort: Pulmonary effort is normal. No respiratory distress.     Breath sounds: No rales.  Chest:     Chest wall: No tenderness.  Abdominal:     General: There is no distension.     Palpations: Abdomen is soft.     Tenderness: There is no abdominal tenderness.  Musculoskeletal:        General: Normal range of motion.     Cervical back: Normal range of motion and neck supple.  Skin:    General: Skin is warm and dry.     Findings: No erythema.  Neurological:     Mental Status: He is alert and oriented to person, place, and time.      Cranial Nerves: No cranial nerve deficit.     Motor: No  abnormal muscle tone.     Coordination: Coordination normal.  Psychiatric:        Mood and Affect: Affect normal.      LABORATORY DATA:  I have reviewed the data as listed    Latest Ref Rng & Units 12/30/2022    7:50 AM 12/24/2022    7:53 AM 12/18/2022    2:32 PM  CBC  WBC 4.0 - 10.5 K/uL 5.6  2.0  2.5   Hemoglobin 13.0 - 17.0 g/dL 16.1  09.6  04.5   Hematocrit 39.0 - 52.0 % 40.0  39.1  36.4   Platelets 150 - 400 K/uL 295  242  170       Latest Ref Rng & Units 12/30/2022    7:50 AM 12/24/2022    7:53 AM 12/20/2022    1:17 PM  CMP  Glucose 70 - 99 mg/dL 409  811  914   BUN 8 - 23 mg/dL 8  11  12    Creatinine 0.61 - 1.24 mg/dL 7.82  9.56  2.13   Sodium 135 - 145 mmol/L 138  138  139   Potassium 3.5 - 5.1 mmol/L 3.7  3.3  3.2   Chloride 98 - 111 mmol/L 104  102  105   CO2 22 - 32 mmol/L 24  25  24    Calcium 8.9 - 10.3 mg/dL 9.0  9.3  8.9   Total Protein 6.5 - 8.1 g/dL 7.6  7.3  7.3   Total Bilirubin 0.3 - 1.2 mg/dL 0.5  0.6  0.3   Alkaline Phos 38 - 126 U/L 59  56  54   AST 15 - 41 U/L 34  37  41   ALT 0 - 44 U/L 38  47  47      RADIOGRAPHIC STUDIES: I have personally reviewed the radiological images as listed and agreed with the findings in the report. No results found.

## 2022-12-31 LAB — CEA: CEA: 11.4 ng/mL — ABNORMAL HIGH (ref 0.0–4.7)

## 2023-01-01 ENCOUNTER — Inpatient Hospital Stay: Payer: No Typology Code available for payment source

## 2023-01-01 VITALS — BP 125/84 | HR 92 | Resp 18

## 2023-01-01 DIAGNOSIS — Z5111 Encounter for antineoplastic chemotherapy: Secondary | ICD-10-CM | POA: Diagnosis not present

## 2023-01-01 DIAGNOSIS — C159 Malignant neoplasm of esophagus, unspecified: Secondary | ICD-10-CM

## 2023-01-01 MED ORDER — SODIUM CHLORIDE 0.9% FLUSH
10.0000 mL | INTRAVENOUS | Status: DC | PRN
  Administered 2023-01-01: 10 mL
  Filled 2023-01-01: qty 10

## 2023-01-01 MED ORDER — HEPARIN SOD (PORK) LOCK FLUSH 100 UNIT/ML IV SOLN
500.0000 [IU] | Freq: Once | INTRAVENOUS | Status: AC | PRN
  Administered 2023-01-01: 500 [IU]
  Filled 2023-01-01: qty 5

## 2023-01-06 ENCOUNTER — Ambulatory Visit: Payer: No Typology Code available for payment source

## 2023-01-06 ENCOUNTER — Other Ambulatory Visit: Payer: No Typology Code available for payment source

## 2023-01-06 ENCOUNTER — Ambulatory Visit: Payer: No Typology Code available for payment source | Admitting: Oncology

## 2023-01-08 ENCOUNTER — Other Ambulatory Visit: Payer: Self-pay | Admitting: Oncology

## 2023-01-08 ENCOUNTER — Ambulatory Visit
Admission: RE | Admit: 2023-01-08 | Discharge: 2023-01-08 | Disposition: A | Discharge status: Home / Self Care | Payer: No Typology Code available for payment source | Source: Ambulatory Visit | Attending: Oncology | Admitting: Oncology

## 2023-01-08 DIAGNOSIS — K8020 Calculus of gallbladder without cholecystitis without obstruction: Secondary | ICD-10-CM | POA: Insufficient documentation

## 2023-01-08 DIAGNOSIS — R160 Hepatomegaly, not elsewhere classified: Secondary | ICD-10-CM | POA: Insufficient documentation

## 2023-01-08 DIAGNOSIS — C7889 Secondary malignant neoplasm of other digestive organs: Secondary | ICD-10-CM | POA: Diagnosis not present

## 2023-01-08 DIAGNOSIS — I2510 Atherosclerotic heart disease of native coronary artery without angina pectoris: Secondary | ICD-10-CM | POA: Insufficient documentation

## 2023-01-08 DIAGNOSIS — I700 Atherosclerosis of aorta: Secondary | ICD-10-CM | POA: Diagnosis not present

## 2023-01-08 DIAGNOSIS — N200 Calculus of kidney: Secondary | ICD-10-CM | POA: Insufficient documentation

## 2023-01-08 DIAGNOSIS — C7972 Secondary malignant neoplasm of left adrenal gland: Secondary | ICD-10-CM | POA: Diagnosis not present

## 2023-01-08 DIAGNOSIS — C7951 Secondary malignant neoplasm of bone: Secondary | ICD-10-CM | POA: Insufficient documentation

## 2023-01-08 DIAGNOSIS — C159 Malignant neoplasm of esophagus, unspecified: Secondary | ICD-10-CM | POA: Insufficient documentation

## 2023-01-08 DIAGNOSIS — C787 Secondary malignant neoplasm of liver and intrahepatic bile duct: Secondary | ICD-10-CM | POA: Insufficient documentation

## 2023-01-08 LAB — GLUCOSE, CAPILLARY: Glucose-Capillary: 99 mg/dL (ref 70–99)

## 2023-01-08 MED ORDER — METHYLPREDNISOLONE 4 MG PO TBPK: ORAL_TABLET | ORAL | 0 refills | Status: DC

## 2023-01-08 MED ORDER — FLUDEOXYGLUCOSE F - 18 (FDG) INJECTION
12.0000 | Freq: Once | INTRAVENOUS | Status: AC | PRN
  Administered 2023-01-08: 12.42 via INTRAVENOUS

## 2023-01-10 MED FILL — Dexamethasone Sodium Phosphate Inj 100 MG/10ML: INTRAMUSCULAR | Qty: 1 | Status: AC

## 2023-01-13 ENCOUNTER — Inpatient Hospital Stay (HOSPITAL_BASED_OUTPATIENT_CLINIC_OR_DEPARTMENT_OTHER): Payer: No Typology Code available for payment source | Admitting: Oncology

## 2023-01-13 ENCOUNTER — Inpatient Hospital Stay: Payer: No Typology Code available for payment source

## 2023-01-13 ENCOUNTER — Encounter: Payer: Self-pay | Admitting: Oncology

## 2023-01-13 VITALS — BP 140/84 | HR 75

## 2023-01-13 VITALS — BP 104/85 | HR 86 | Temp 98.1°F | Resp 18 | Wt 233.5 lb

## 2023-01-13 DIAGNOSIS — T451X5D Adverse effect of antineoplastic and immunosuppressive drugs, subsequent encounter: Secondary | ICD-10-CM

## 2023-01-13 DIAGNOSIS — G4733 Obstructive sleep apnea (adult) (pediatric): Secondary | ICD-10-CM | POA: Diagnosis not present

## 2023-01-13 DIAGNOSIS — C159 Malignant neoplasm of esophagus, unspecified: Secondary | ICD-10-CM

## 2023-01-13 DIAGNOSIS — R21 Rash and other nonspecific skin eruption: Secondary | ICD-10-CM

## 2023-01-13 DIAGNOSIS — G620 Drug-induced polyneuropathy: Secondary | ICD-10-CM

## 2023-01-13 DIAGNOSIS — I82621 Acute embolism and thrombosis of deep veins of right upper extremity: Secondary | ICD-10-CM | POA: Diagnosis not present

## 2023-01-13 DIAGNOSIS — Z5111 Encounter for antineoplastic chemotherapy: Secondary | ICD-10-CM | POA: Diagnosis not present

## 2023-01-13 DIAGNOSIS — D701 Agranulocytosis secondary to cancer chemotherapy: Secondary | ICD-10-CM | POA: Diagnosis not present

## 2023-01-13 DIAGNOSIS — E876 Hypokalemia: Secondary | ICD-10-CM

## 2023-01-13 DIAGNOSIS — R1319 Other dysphagia: Secondary | ICD-10-CM

## 2023-01-13 DIAGNOSIS — T451X5A Adverse effect of antineoplastic and immunosuppressive drugs, initial encounter: Secondary | ICD-10-CM

## 2023-01-13 LAB — CBC WITH DIFFERENTIAL (CANCER CENTER ONLY)
Abs Immature Granulocytes: 0.02 10*3/uL (ref 0.00–0.07)
Basophils Absolute: 0 10*3/uL (ref 0.0–0.1)
Basophils Relative: 1 %
Eosinophils Absolute: 0.1 10*3/uL (ref 0.0–0.5)
Eosinophils Relative: 1 %
HCT: 39.5 % (ref 39.0–52.0)
Hemoglobin: 12.7 g/dL — ABNORMAL LOW (ref 13.0–17.0)
Immature Granulocytes: 0 %
Lymphocytes Relative: 10 %
Lymphs Abs: 0.8 10*3/uL (ref 0.7–4.0)
MCH: 27.7 pg (ref 26.0–34.0)
MCHC: 32.2 g/dL (ref 30.0–36.0)
MCV: 86.1 fL (ref 80.0–100.0)
Monocytes Absolute: 1 10*3/uL (ref 0.1–1.0)
Monocytes Relative: 13 %
Neutro Abs: 5.6 10*3/uL (ref 1.7–7.7)
Neutrophils Relative %: 75 %
Platelet Count: 183 10*3/uL (ref 150–400)
RBC: 4.59 MIL/uL (ref 4.22–5.81)
RDW: 20.5 % — ABNORMAL HIGH (ref 11.5–15.5)
WBC Count: 7.4 10*3/uL (ref 4.0–10.5)
nRBC: 0 % (ref 0.0–0.2)

## 2023-01-13 LAB — CMP (CANCER CENTER ONLY)
ALT: 54 U/L — ABNORMAL HIGH (ref 0–44)
AST: 38 U/L (ref 15–41)
Albumin: 4 g/dL (ref 3.5–5.0)
Alkaline Phosphatase: 64 U/L (ref 38–126)
Anion gap: 7 (ref 5–15)
BUN: 14 mg/dL (ref 8–23)
CO2: 27 mmol/L (ref 22–32)
Calcium: 9.2 mg/dL (ref 8.9–10.3)
Chloride: 104 mmol/L (ref 98–111)
Creatinine: 0.89 mg/dL (ref 0.61–1.24)
GFR, Estimated: >60 mL/min (ref >60)
Glucose, Bld: 102 mg/dL — ABNORMAL HIGH (ref 70–99)
Potassium: 3.5 mmol/L (ref 3.5–5.1)
Sodium: 138 mmol/L (ref 135–145)
Total Bilirubin: 0.8 mg/dL (ref 0.3–1.2)
Total Protein: 7.6 g/dL (ref 6.5–8.1)

## 2023-01-13 MED ORDER — SODIUM CHLORIDE 0.9 % IV SOLN
10.0000 mg | Freq: Once | INTRAVENOUS | Status: AC
  Administered 2023-01-13: 10 mg via INTRAVENOUS
  Filled 2023-01-13: qty 10

## 2023-01-13 MED ORDER — LEUCOVORIN CALCIUM INJECTION 350 MG
400.0000 mg/m2 | Freq: Once | INTRAVENOUS | Status: AC
  Administered 2023-01-13: 920 mg via INTRAVENOUS
  Filled 2023-01-13: qty 46

## 2023-01-13 MED ORDER — OXALIPLATIN CHEMO INJECTION 100 MG/20ML
70.0000 mg/m2 | Freq: Once | INTRAVENOUS | Status: AC
  Administered 2023-01-13: 150 mg via INTRAVENOUS
  Filled 2023-01-13: qty 10

## 2023-01-13 MED ORDER — FLUOROURACIL CHEMO INJECTION 2.5 GM/50ML
400.0000 mg/m2 | Freq: Once | INTRAVENOUS | Status: AC
  Administered 2023-01-13: 1000 mg via INTRAVENOUS
  Filled 2023-01-13: qty 20

## 2023-01-13 MED ORDER — DEXAMETHASONE 4 MG PO TABS: 8.0000 mg | ORAL_TABLET | ORAL | 0 refills | Status: DC

## 2023-01-13 MED ORDER — SODIUM CHLORIDE 0.9 % IV SOLN
2400.0000 mg/m2 | INTRAVENOUS | Status: DC
  Administered 2023-01-13: 5500 mg via INTRAVENOUS
  Filled 2023-01-13: qty 110

## 2023-01-13 MED ORDER — DEXTROSE 5 % IV SOLN
Freq: Once | INTRAVENOUS | Status: AC
  Filled 2023-01-13: qty 250

## 2023-01-13 MED ORDER — PALONOSETRON HCL INJECTION 0.25 MG/5ML
0.2500 mg | Freq: Once | INTRAVENOUS | Status: AC
  Administered 2023-01-13: 0.25 mg via INTRAVENOUS
  Filled 2023-01-13: qty 5

## 2023-01-13 NOTE — Progress Notes (Signed)
Pt reports "itching" the last two treatments (especially the last treatment). Per Dr. Cathie Hoops no additional premeds needed, that plan was discussed with pt in the MD office visit.

## 2023-01-13 NOTE — Assessment & Plan Note (Signed)
Stage IV esophageal adenocarcinoma, liver metastatic disease I recommend systematic palliative chemotherapy with FOLFOX [Oxaliplatin 75mg /m2 while on RT] .  Labs are reviewed and discussed with patient.  Proceed with FOLFOX  PET scan for evaluation of treatment response- result is pending

## 2023-01-13 NOTE — Assessment & Plan Note (Signed)
Grade 2.  Decrease oxaliplatin to 70mg /m2

## 2023-01-13 NOTE — Assessment & Plan Note (Signed)
Continue 20meq TID.   

## 2023-01-13 NOTE — Assessment & Plan Note (Signed)
Chemotherapy plan as listed above 

## 2023-01-13 NOTE — Patient Instructions (Signed)
[facility]  Discharge Instructions: Thank you for choosing [facility] to provide your oncology and hematology care.  If you have a lab appointment with the Cancer Center, please go directly to the Cancer Center and check in at the registration area.  Wear comfortable clothing and clothing appropriate for easy access to any Portacath or PICC line.   We strive to give you quality time with your provider. You may need to reschedule your appointment if you arrive late (15 or more minutes).  Arriving late affects you and other patients whose appointments are after yours.  Also, if you miss three or more appointments without notifying the office, you may be dismissed from the clinic at the provider's discretion.      For prescription refill requests, have your pharmacy contact our office and allow 72 hours for refills to be completed.    Today you received the following chemotherapy and/or immunotherapy agents Oxaliplatin, Leucovorin and Adrucil       To help prevent nausea and vomiting after your treatment, we encourage you to take your nausea medication as directed.  BELOW ARE SYMPTOMS THAT SHOULD BE REPORTED IMMEDIATELY: *FEVER GREATER THAN 100.4 F (38 C) OR HIGHER *CHILLS OR SWEATING *NAUSEA AND VOMITING THAT IS NOT CONTROLLED WITH YOUR NAUSEA MEDICATION *UNUSUAL SHORTNESS OF BREATH *UNUSUAL BRUISING OR BLEEDING *URINARY PROBLEMS (pain or burning when urinating, or frequent urination) *BOWEL PROBLEMS (unusual diarrhea, constipation, pain near the anus) TENDERNESS IN MOUTH AND THROAT WITH OR WITHOUT PRESENCE OF ULCERS (sore throat, sores in mouth, or a toothache) UNUSUAL RASH, SWELLING OR PAIN  UNUSUAL VAGINAL DISCHARGE OR ITCHING   Items with * indicate a potential emergency and should be followed up as soon as possible or go to the Emergency Department if any problems should occur.  Please show the CHEMOTHERAPY ALERT CARD or  IMMUNOTHERAPY ALERT CARD at check-in to the Emergency Department and triage nurse.  Should you have questions after your visit or need to cancel or reschedule your appointment, please contact [facility]  [phone] and follow the prompts.  Office hours are 8:00 a.m. to 4:30 p.m. Monday - Friday. Please note that voicemails left after 4:00 p.m. may not be returned until the following business day.  We are closed weekends and major holidays. You have access to a nurse at all times for urgent questions. Please call the main number to the clinic [phone] and follow the prompts.  For any non-urgent questions, you may also contact your provider using MyChart. We now offer e-Visits for anyone 18 and older to request care online for non-urgent symptoms. For details visit [email or web address].   Also download the MyChart app! Go to the app store, search "MyChart", open the app, select [place], and log in with your MyChart username and password.    

## 2023-01-13 NOTE — Assessment & Plan Note (Signed)
Likely due to chemotherapy.  He gets Dexamethasone 10mg  prior to chemo, I recommend him to take Dexamethasone 8mg  daily for 2-3 days.

## 2023-01-13 NOTE — Progress Notes (Signed)
Hematology/Oncology Progress note [phone] [phone]        REFERRING PROVIDER: Rickard Patience, MD  CHIEF COMPLAINTS/PURPOSE OF CONSULTATION:  Stage IV Esophageal adenocarcinoma  ASSESSMENT & PLAN:   Cancer Staging  Adenocarcinoma of esophagus [facility]) Staging form: Esophagus - Adenocarcinoma, AJCC 8th Edition - Clinical stage from 08/28/2022: Stage IVB (cT3, cN1, cM1) - Signed by Rickard Patience, MD on 10/04/2022   Adenocarcinoma of esophagus (HCC) Stage IV esophageal adenocarcinoma, liver metastatic disease I recommend systematic palliative chemotherapy with FOLFOX [Oxaliplatin 75mg /m2 while on RT] .  Labs are reviewed and discussed with patient.  Proceed with FOLFOX  PET scan for evaluation of treatment response- result is pending  Hypokalemia Continue TID.    Encounter for antineoplastic chemotherapy Chemotherapy plan as listed above.   Deep venous thrombosis (HCC) Continue Eliquis 5mg  BID   Chemotherapy-induced neuropathy (HCC) Grade 2.  Decrease oxaliplatin to 70mg /m2  Skin rash Likely due to chemotherapy.  He gets Dexamethasone 10mg  prior to chemo, I recommend him to take Dexamethasone 8mg  daily for 2-3 days.      Orders Placed This Encounter  Procedures   CEA    Standing Status:   Future    Standing Expiration Date:   02/10/2024    Follow-up  1 week lab MD FOLFOX D3 pump dc  All questions were answered. The patient knows to call the clinic with any problems, questions or concerns.  Rickard Patience, MD, PhD [facility] Hematology Oncology 01/13/2023   HISTORY OF PRESENTING ILLNESS:  Jonathan [name] 67 y.o. male presents to establish care for esophageal adenocarcinoma I have reviewed his chart and materials related to his cancer extensively and collaborated history with the patient. Summary of oncologic history is as follows: Oncology History  Adenocarcinoma of esophagus (HCC)  08/28/2022 Initial Diagnosis   Adenocarcinoma of esophagus    -Patient has noticed worsening of "food stuck/fullness" sensation since November 2023.  Patient had a barium swallow study which commented on marked mucosal irregularity in the distal esophagus with Broaddus base mural filling defect highly suspicious for malignancy.  Patient establish care with gastroenterology. -08/23/2022, EGD showed gastritis and partially obstructing malignant esophageal tumor in the lower third of the esophagus. Esophagus mass biopsy showed adenocarcinoma.  PD-L1 TPS 65%, HER2 negative.  [facility] NGS showed KRASG12V, CDKN2A, ARID1A, TP53, TMB 5.3, MSI stable.  Stomach biopsy showed gastric mucosa with no specific histology abnormality.  No significant intestinal metaplastic, dysplastic, granular atrophy or increased inflammation.     08/28/2022 Imaging   CT chest abdomen pelvis with contrast showed 1. Distal esophageal primary with gastrohepatic ligament nodal metastasis. 2. 2 right-sided pulmonary nodules, the largest of which measures 5 mm and is new since 2015. Pulmonary metastasis not be excluded. 3. Anterior right lower lobe volume loss and minimal soft tissue density, favoring atelectasis or scar. Recommend attention on follow-up. 4. Hepatic steatosis 5. Cholelithiasis 6. Left nephrolithiasis 7. Coronary artery atherosclerosis. Aortic Atherosclerosis   08/28/2022 Cancer Staging   Staging form: Esophagus - Adenocarcinoma, AJCC 8th Edition - Clinical stage from 08/28/2022: Stage IVB (cT3, cN1, cM1) - Signed by Rickard Patience, MD on 10/04/2022 Stage prefix: Initial diagnosis   09/06/2022 Imaging   PET scan showed 1. Esophageal primary with gastrohepatic ligament nodal metastasis,as on CT. 2. Focus of hypermetabolism which is favored to registered to the posterior hepatic dome, in the region of subtle heterogeneity on prior diagnostic CT. Suboptimally evaluated secondary to underlying steatosis. Recommend further evaluation with pre and post contrast abdominal MRI  to confirm  probable metastasis. 3. Incidental findings, including: Left nephrolithiasis.Cholelithiasis. Coronary artery atherosclerosis. Aortic Atherosclerosis    09/14/2022 - 09/14/2022 Chemotherapy   Patient is on Treatment Plan : ESOPHAGUS Carboplatin + Paclitaxel Weekly X 6 Weeks with XRT     09/23/2022 Imaging   MRI abdomen with and without contrast showed 1. Mildly T2 hyperintense segment VII hepatic lesion measuring 2.7 cm with imaging characteristics compatible with metastatic disease. 2. Tiny focus of delayed enhancement in the inferior right lobe of the liver segment VI measuring 8 mm with ill-defined increased T2 signal and subtle corresponding reduced diffusivity, also suspicious for metastatic disease. 3. Partially visualized distal esophageal wall thickening compatible with the patient's known primary neoplasm. 4. Similar size of the 11 mm gastrohepatic ligament lymph node mildly metabolic on prior PET-CT and compatible with local nodal disease involvement. 5. 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 Recommend follow up pre and post-contrast MRI/MRCP in 1 year. 6. Diffuse hepatic steatosis.   10/10/2022 Procedure   LIVER MASS; CT-GUIDED BIOPSY:  - MODERATE TO POORLY DIFFERENTIATED ADENOCARCINOMA MORPHOLOGICALLY  CONSISTENT WITH METASTASIS FROM PATIENT'S KNOWN ESOPHAGEAL  ADENOCARCINOMA.    10/17/2022 Procedure   Medi port placed by Dr. Wyn Quaker   10/21/2022 -  Chemotherapy   Patient is on Treatment Plan : ESOPHAGEAL ADENOCARCINOMA FOLFOX q14d x 6 cycles     Patient presents to establish care.  He is not taking PPI. He has intentionally lost some weight. Family history positive for father and paternal uncle with prostate cancer and sister with breast cancer. Denies any routine alcohol use  Right interval jugular vein occlusive DVT, started on Eliquis starter package on 10/28/2022, swelling of neck has  improved.    INTERVAL HISTORY Jonathan [name] is a 67 y.o. male who has above history reviewed by me today presents for follow up visit for Stage IV esophageal adenocarcinoma.  He tolerated chemotherapy, except that he again developed itchy rash all over her body. Was treated for another course of prednisone taper.  Rash has mostly resolved.    MEDICAL HISTORY:  Past Medical History:  Diagnosis Date   Arthritis    Complication of anesthesia    OCCURRED ONCE YEARS AGO 1981   Esophageal mass    Hypertension    Hypothyroidism    PONV (postoperative nausea and vomiting)     SURGICAL HISTORY: Past Surgical History:  Procedure Laterality Date   COLONOSCOPY     ESOPHAGOGASTRODUODENOSCOPY N/A 08/23/2022   Procedure: ESOPHAGOGASTRODUODENOSCOPY (EGD);  Surgeon: Jaynie Collins, DO;  Location: [facility] ENDOSCOPY;  Service: Gastroenterology;  Laterality: N/A;   EUS N/A 09/12/2022   Procedure: FULL UPPER ENDOSCOPIC ULTRASOUND (EUS) RADIAL;  Surgeon: Bearl Mulberry, MD;  Location: [facility] ENDOSCOPY;  Service: Gastroenterology;  Laterality: N/A;   FINGER SURGERY     PORTA CATH INSERTION N/A 10/17/2022   Procedure: PORTA CATH INSERTION;  Surgeon: Annice Needy, MD;  Location: [facility] INVASIVE CV LAB;  Service: Cardiovascular;  Laterality: N/A;   TENDON REPAIR IN LEFT KNEE      SOCIAL HISTORY: Social History   Socioeconomic History   Marital status: Single    Spouse name: Not on file   Number of children: Not on file   Years of education: Not on file   Highest education level: Not on file  Occupational History   Not on file  Tobacco Use   Smoking status: Never   Smokeless  tobacco: Never  Vaping Use   Vaping Use: Never used  Substance and Sexual Activity   Alcohol use: Yes    Comment: OCCASIONALLY   Drug use: Never   Sexual activity: Not on file  Other Topics Concern   Not on file  Social History Narrative   Not on file   Social Determinants of Health   Financial Resource  Strain: Not on file  Food Insecurity: No Food Insecurity (08/28/2022)   Hunger Vital Sign    Worried About Running Out of Food in the Last Year: Never true    Ran Out of Food in the Last Year: Never true  Transportation Needs: No Transportation Needs (08/28/2022)   PRAPARE - Administrator, Civil Service (Medical): No    Lack of Transportation (Non-Medical): No  Physical Activity: Not on file  Stress: Not on file  Social Connections: Not on file  Intimate Partner Violence: Not At Risk (08/28/2022)   Humiliation, Afraid, Rape, and Kick questionnaire    Fear of Current or Ex-Partner: No    Emotionally Abused: No    Physically Abused: No    Sexually Abused: No    FAMILY HISTORY: Family History  Problem Relation Age of Onset   Heart attack Mother    Prostate cancer Father    Breast cancer Sister    Prostate cancer Paternal Uncle     ALLERGIES:  has No Known Allergies.  MEDICATIONS:  Current Outpatient Medications  Medication Sig Dispense Refill   acetaminophen (TYLENOL) 650 MG CR tablet Take 1,300 mg by mouth every 8 (eight) hours as needed for pain.     amLODipine (NORVASC) 10 MG tablet Take 10 mg by mouth daily.     apixaban (ELIQUIS) 5 MG TABS tablet Take 1 tablet (5 mg total) by mouth 2 (two) times daily. 60 tablet 1   atenolol (TENORMIN) 50 MG tablet Take 50 mg by mouth daily.     chlorhexidine (PERIDEX) 0.12 % solution Use as directed 15 mLs in the mouth or throat 2 (two) times daily. 473 mL 1   Cholecalciferol (VITAMIN D3) 125 MCG (5000 UT) CAPS Take 5,000 Units by mouth daily.     dexamethasone (DECADRON) 4 MG tablet Take 2 tablets (8 mg total) by mouth See admin instructions. Take 8mg  daily for 2-3 days after each chemotherapy. 30 tablet 0   hydrochlorothiazide (HYDRODIURIL) 25 MG tablet Take 25 mg by mouth daily.     levothyroxine (SYNTHROID) 100 MCG tablet Take 100 mcg by mouth daily before breakfast.     levothyroxine (SYNTHROID) 125 MCG tablet Take 125 mcg  by mouth every morning.     lidocaine-prilocaine (EMLA) cream Apply to affected area once 30 g 6   Multiple Vitamins-Minerals (MULTIVITAMIN WITH MINERALS) tablet Take 1 tablet by mouth daily.     omeprazole (PRILOSEC) 20 MG capsule Take 20 mg by mouth daily.     ondansetron (ZOFRAN) 8 MG tablet Take 1 tablet (8 mg total) by mouth every 8 (eight) hours as needed for nausea or vomiting. Start on the third day after chemotherapy. 30 tablet 1   potassium chloride SA (KLOR-CON M) 20 MEQ tablet Take 1 tablet (20 mEq total) by mouth 3 (three) times daily. 60 tablet 0   prochlorperazine (COMPAZINE) 10 MG tablet Take 1 tablet (10 mg total) by mouth every 6 (six) hours as needed for nausea or vomiting. 30 tablet 1   amLODipine (NORVASC) 5 MG tablet Take 5 mg by mouth daily. (Patient  not taking: Reported on 01/13/2023)     APIXABAN (ELIQUIS) VTE STARTER PACK (10MG  AND 5MG ) Take as directed on package: start with two-5mg  tablets twice daily for 7 days. On day 8, switch to one-5mg  tablet twice daily. (Patient not taking: Reported on 01/13/2023) 1 each 0   No current facility-administered medications for this visit.   Facility-Administered Medications Ordered in Other Visits  Medication Dose Route Frequency Provider Last Rate Last Admin   dexamethasone (DECADRON) 10 mg in sodium chloride 0.9 % 50 mL IVPB  10 mg Intravenous Once Rickard Patience, MD 204 mL/hr at 01/13/23 0921 10 mg at 01/13/23 1610   fluorouracil (ADRUCIL) 5,500 mg in sodium chloride 0.9 % 140 mL chemo infusion  2,400 mg/m2 (Order-Specific) Intravenous 1 day or 1 dose Rickard Patience, MD       fluorouracil (ADRUCIL) chemo injection 1,000 mg  400 mg/m2 (Order-Specific) Intravenous Once Rickard Patience, MD       leucovorin 920 mg in dextrose 5 % 250 mL infusion  400 mg/m2 (Order-Specific) Intravenous Once Rickard Patience, MD       oxaliplatin (ELOXATIN) 150 mg in dextrose 5 % 500 mL chemo infusion  70 mg/m2 (Order-Specific) Intravenous Once Rickard Patience, MD        Review of  Systems  Constitutional:  Negative for appetite change, chills, fatigue and fever.  HENT:   Negative for hearing loss and voice change.   Eyes:  Negative for eye problems and icterus.  Respiratory:  Negative for chest tightness, cough and shortness of breath.   Cardiovascular:  Negative for chest pain and leg swelling.  Gastrointestinal:  Negative for abdominal distention, abdominal pain, nausea and vomiting.  Endocrine: Negative for hot flashes.  Genitourinary:  Negative for difficulty urinating, dysuria and frequency.   Musculoskeletal:  Negative for arthralgias.  Skin:  Positive for rash. Negative for itching.  Neurological:  Negative for light-headedness and numbness.  Hematological:  Negative for adenopathy. Does not bruise/bleed easily.  Psychiatric/Behavioral:  Negative for confusion.      PHYSICAL EXAMINATION: ECOG PERFORMANCE STATUS: 0 - Asymptomatic  Vitals:   01/13/23 0840  BP: 104/85  Pulse: 86  Resp: 18  Temp: 98.1 F (36.7 C)  SpO2: 98%   Filed Weights   01/13/23 0840  Weight: 233 lb 8 oz (105.9 kg)    Physical Exam Constitutional:      General: He is not in acute distress.    Appearance: He is obese. He is not diaphoretic.  HENT:     Head: Normocephalic and atraumatic.     Nose: Nose normal.     Mouth/Throat:     Pharynx: No oropharyngeal exudate.  Eyes:     General: No scleral icterus.    Pupils: Pupils are equal, round, and reactive to light.  Cardiovascular:     Rate and Rhythm: Normal rate and regular rhythm.     Heart sounds: No murmur heard. Pulmonary:     Effort: Pulmonary effort is normal. No respiratory distress.     Breath sounds: No rales.  Chest:     Chest wall: No tenderness.  Abdominal:     General: There is no distension.     Palpations: Abdomen is soft.     Tenderness: There is no abdominal tenderness.  Musculoskeletal:        General: Normal range of motion.     Cervical back: Normal range of motion and neck supple.  Skin:     General: Skin is warm and dry.  Findings: No erythema.  Neurological:     Mental Status: He is alert and oriented to person, place, and time.     Cranial Nerves: No cranial nerve deficit.     Motor: No abnormal muscle tone.     Coordination: Coordination normal.  Psychiatric:        Mood and Affect: Affect normal.      LABORATORY DATA:  I have reviewed the data as listed    Latest Ref Rng & Units 01/13/2023    8:20 AM 12/30/2022    7:50 AM 12/24/2022    7:53 AM  CBC  WBC 4.0 - 10.5 K/uL 7.4  5.6  2.0   Hemoglobin 13.0 - 17.0 g/dL 09.8  11.9  14.7   Hematocrit 39.0 - 52.0 % 39.5  40.0  39.1   Platelets 150 - 400 K/uL 183  295  242       Latest Ref Rng & Units 01/13/2023    8:20 AM 12/30/2022    7:50 AM 12/24/2022    7:53 AM  CMP  Glucose 70 - 99 mg/dL 829  562  130   BUN 8 - 23 mg/dL 14  8  11    Creatinine 0.61 - 1.24 mg/dL 8.65  7.84  6.96   Sodium 135 - 145 mmol/L 138  138  138   Potassium 3.5 - 5.1 mmol/L 3.5  3.7  3.3   Chloride 98 - 111 mmol/L 104  104  102   CO2 22 - 32 mmol/L 27  24  25    Calcium 8.9 - 10.3 mg/dL 9.2  9.0  9.3   Total Protein 6.5 - 8.1 g/dL 7.6  7.6  7.3   Total Bilirubin 0.3 - 1.2 mg/dL 0.8  0.5  0.6   Alkaline Phos 38 - 126 U/L 64  59  56   AST 15 - 41 U/L 38  34  37   ALT 0 - 44 U/L 54  38  47      RADIOGRAPHIC STUDIES: I have personally reviewed the radiological images as listed and agreed with the findings in the report. No results found.

## 2023-01-13 NOTE — Assessment & Plan Note (Signed)
Continue Eliquis 5mg BID 

## 2023-01-14 DIAGNOSIS — M62830 Muscle spasm of back: Secondary | ICD-10-CM | POA: Diagnosis not present

## 2023-01-14 DIAGNOSIS — M9901 Segmental and somatic dysfunction of cervical region: Secondary | ICD-10-CM | POA: Diagnosis not present

## 2023-01-14 DIAGNOSIS — M9903 Segmental and somatic dysfunction of lumbar region: Secondary | ICD-10-CM | POA: Diagnosis not present

## 2023-01-14 DIAGNOSIS — M9902 Segmental and somatic dysfunction of thoracic region: Secondary | ICD-10-CM | POA: Diagnosis not present

## 2023-01-15 ENCOUNTER — Inpatient Hospital Stay: Payer: No Typology Code available for payment source

## 2023-01-15 VITALS — BP 134/89 | HR 78 | Resp 18

## 2023-01-15 DIAGNOSIS — Z5111 Encounter for antineoplastic chemotherapy: Secondary | ICD-10-CM | POA: Diagnosis not present

## 2023-01-15 DIAGNOSIS — C159 Malignant neoplasm of esophagus, unspecified: Secondary | ICD-10-CM

## 2023-01-15 MED ORDER — SODIUM CHLORIDE 0.9% FLUSH
10.0000 mL | INTRAVENOUS | Status: DC | PRN
  Administered 2023-01-15: 10 mL
  Filled 2023-01-15: qty 10

## 2023-01-15 MED ORDER — HEPARIN SOD (PORK) LOCK FLUSH 100 UNIT/ML IV SOLN
500.0000 [IU] | Freq: Once | INTRAVENOUS | Status: AC | PRN
  Administered 2023-01-15: 500 [IU]
  Filled 2023-01-15: qty 5

## 2023-01-16 ENCOUNTER — Other Ambulatory Visit: Payer: Self-pay | Admitting: Oncology

## 2023-01-17 ENCOUNTER — Encounter: Payer: Self-pay | Admitting: Oncology

## 2023-01-20 ENCOUNTER — Telehealth: Payer: Self-pay | Admitting: *Deleted

## 2023-01-20 ENCOUNTER — Other Ambulatory Visit: Payer: No Typology Code available for payment source

## 2023-01-20 ENCOUNTER — Ambulatory Visit: Payer: No Typology Code available for payment source | Admitting: Oncology

## 2023-01-20 ENCOUNTER — Ambulatory Visit: Payer: No Typology Code available for payment source

## 2023-01-20 NOTE — Telephone Encounter (Addendum)
Spoke with patient - pt accepted apt with Josh tomorrow at 930 am to further evaluate his mouth sores.

## 2023-01-20 NOTE — Telephone Encounter (Signed)
Patient called reporting that his lower mouth and his lower lip is swollen. He has [employer] that he has been using. Please advise

## 2023-01-21 ENCOUNTER — Encounter: Payer: Self-pay | Admitting: Oncology

## 2023-01-21 ENCOUNTER — Inpatient Hospital Stay (HOSPITAL_BASED_OUTPATIENT_CLINIC_OR_DEPARTMENT_OTHER): Payer: No Typology Code available for payment source | Admitting: Hospice and Palliative Medicine

## 2023-01-21 ENCOUNTER — Other Ambulatory Visit: Payer: Self-pay

## 2023-01-21 VITALS — BP 129/87 | HR 66 | Temp 97.9°F | Resp 18 | Ht 69.0 in | Wt 228.0 lb

## 2023-01-21 DIAGNOSIS — C159 Malignant neoplasm of esophagus, unspecified: Secondary | ICD-10-CM

## 2023-01-21 DIAGNOSIS — Z5111 Encounter for antineoplastic chemotherapy: Secondary | ICD-10-CM | POA: Diagnosis not present

## 2023-01-21 DIAGNOSIS — K1230 Oral mucositis (ulcerative), unspecified: Secondary | ICD-10-CM

## 2023-01-21 MED ORDER — NYSTATIN 100000 UNIT/ML MT SUSP
5.0000 mL | Freq: Three times a day (TID) | OROMUCOSAL | 2 refills | Status: DC | PRN
  Filled 2023-01-21: qty 200, 14d supply, fill #0

## 2023-01-21 NOTE — Progress Notes (Signed)
Symptom Management [facility] at [facility] Telephone:([phone] Fax:([phone]  Patient Care Team: Jonathan Mina, MD as PCP - General (Family Medicine) Jonathan Gutter, RN as Oncology Nurse Navigator   NAME OF PATIENT: Jonathan [name]  [id number]  [birth year]   DATE OF VISIT: 01/21/23  REASON FOR CONSULT: Jonathan [name] is a 67 y.o. 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 .   INTERVAL HISTORY: Patient received cycle 6 FOLFOX on 01/13/2023.  He presents to [facility] today with severe oral pain unrelieved with chlorhexidine mouthwash.   Denies any neurologic complaints. Denies recent fevers or illnesses. Denies any easy bleeding or bruising. Reports good appetite and denies weight loss. Denies chest pain. Denies any nausea, vomiting, constipation, or diarrhea. Denies urinary complaints. Patient offers no further specific complaints today.   PAST MEDICAL HISTORY: Past Medical History:  Diagnosis Date   Arthritis    Complication of anesthesia    OCCURRED ONCE YEARS AGO 1981   Esophageal mass    Hypertension    Hypothyroidism    PONV (postoperative nausea and vomiting)     PAST SURGICAL HISTORY:  Past Surgical History:  Procedure Laterality Date   COLONOSCOPY     ESOPHAGOGASTRODUODENOSCOPY N/A 08/23/2022   Procedure: ESOPHAGOGASTRODUODENOSCOPY (EGD);  Surgeon: Jaynie Collins, DO;  Location: [facility] ENDOSCOPY;  Service: Gastroenterology;  Laterality: N/A;   EUS N/A 09/12/2022   Procedure: FULL UPPER ENDOSCOPIC ULTRASOUND (EUS) RADIAL;  Surgeon: Bearl Mulberry, MD;  Location: [facility] ENDOSCOPY;  Service: Gastroenterology;  Laterality: N/A;   FINGER SURGERY     PORTA CATH INSERTION N/A 10/17/2022   Procedure: PORTA CATH INSERTION;  Surgeon: Annice Needy, MD;  Location: [facility] INVASIVE CV LAB;  Service: Cardiovascular;  Laterality: N/A;    TENDON REPAIR IN LEFT KNEE      HEMATOLOGY/ONCOLOGY HISTORY:  Oncology History  Adenocarcinoma of esophagus (HCC)  08/28/2022 Initial Diagnosis   Adenocarcinoma of esophagus   -Patient has noticed worsening of "food stuck/fullness" sensation since November 2023.  Patient had a barium swallow study which commented on marked mucosal irregularity in the distal esophagus with Broaddus base mural filling defect highly suspicious for malignancy.  Patient establish care with gastroenterology. -08/23/2022, EGD showed gastritis and partially obstructing malignant esophageal tumor in the lower third of the esophagus. Esophagus mass biopsy showed adenocarcinoma.  PD-L1 TPS 65%, HER2 negative.  [facility] NGS showed KRASG12V, CDKN2A, ARID1A, TP53, TMB 5.3, MSI stable.  Stomach biopsy showed gastric mucosa with no specific histology abnormality.  No significant intestinal metaplastic, dysplastic, granular atrophy or increased inflammation.     08/28/2022 Imaging   CT chest abdomen pelvis with contrast showed 1. Distal esophageal primary with gastrohepatic ligament nodal metastasis. 2. 2 right-sided pulmonary nodules, the largest of which measures 5 mm and is new since 2015. Pulmonary metastasis not be excluded. 3. Anterior right lower lobe volume loss and minimal soft tissue density, favoring atelectasis or scar. Recommend attention on follow-up. 4. Hepatic steatosis 5. Cholelithiasis 6. Left nephrolithiasis 7. Coronary artery atherosclerosis. Aortic Atherosclerosis   08/28/2022 Cancer Staging   Staging form: Esophagus - Adenocarcinoma, AJCC 8th Edition - Clinical stage from 08/28/2022: Stage IVB (cT3, cN1, cM1) - Signed by Rickard Patience, MD on 10/04/2022 Stage prefix: Initial diagnosis   09/06/2022 Imaging   PET scan showed 1. Esophageal primary with gastrohepatic ligament nodal metastasis,as on CT. 2. Focus of hypermetabolism which is favored to registered to  the posterior hepatic dome, in the region of  subtle heterogeneity on prior diagnostic CT. Suboptimally evaluated secondary to underlying steatosis. Recommend further evaluation with pre and post contrast abdominal MRI to confirm probable metastasis. 3. Incidental findings, including: Left nephrolithiasis.Cholelithiasis. Coronary artery atherosclerosis. Aortic Atherosclerosis    09/14/2022 - 09/14/2022 Chemotherapy   Patient is on Treatment Plan : ESOPHAGUS Carboplatin + Paclitaxel Weekly X 6 Weeks with XRT     09/23/2022 Imaging   MRI abdomen with and without contrast showed 1. Mildly T2 hyperintense segment VII hepatic lesion measuring 2.7 cm with imaging characteristics compatible with metastatic disease. 2. Tiny focus of delayed enhancement in the inferior right lobe of the liver segment VI measuring 8 mm with ill-defined increased T2 signal and subtle corresponding reduced diffusivity, also suspicious for metastatic disease. 3. Partially visualized distal esophageal wall thickening compatible with the patient's known primary neoplasm. 4. Similar size of the 11 mm gastrohepatic ligament lymph node mildly metabolic on prior PET-CT and compatible with local nodal disease involvement. 5. 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 Recommend follow up pre and post-contrast MRI/MRCP in 1 year. 6. Diffuse hepatic steatosis.   10/10/2022 Procedure   LIVER MASS; CT-GUIDED BIOPSY:  - MODERATE TO POORLY DIFFERENTIATED ADENOCARCINOMA MORPHOLOGICALLY  CONSISTENT WITH METASTASIS FROM PATIENT'S KNOWN ESOPHAGEAL  ADENOCARCINOMA.    10/17/2022 Procedure   Medi port placed by Dr. Wyn Quaker   10/21/2022 -  Chemotherapy   Patient is on Treatment Plan : ESOPHAGEAL ADENOCARCINOMA FOLFOX q14d x 6 cycles       ALLERGIES:  has No Known Allergies.  MEDICATIONS:  Current Outpatient Medications  Medication Sig Dispense Refill   acetaminophen (TYLENOL) 650 MG CR tablet Take  1,300 mg by mouth every 8 (eight) hours as needed for pain.     amLODipine (NORVASC) 10 MG tablet Take 10 mg by mouth daily.     atenolol (TENORMIN) 50 MG tablet Take 50 mg by mouth daily.     chlorhexidine (PERIDEX) 0.12 % solution Use as directed 15 mLs in the mouth or throat 2 (two) times daily. 473 mL 1   Cholecalciferol (VITAMIN D3) 125 MCG (5000 UT) CAPS Take 5,000 Units by mouth daily.     dexamethasone (DECADRON) 4 MG tablet Take 2 tablets (8 mg total) by mouth See admin instructions. Take 8mg  daily for 2-3 days after each chemotherapy. 30 tablet 0   ELIQUIS 5 MG TABS tablet Take 1 tablet by mouth twice daily 60 tablet 0   hydrochlorothiazide (HYDRODIURIL) 25 MG tablet Take 25 mg by mouth daily.     levothyroxine (SYNTHROID) 125 MCG tablet Take 125 mcg by mouth every morning.     lidocaine-prilocaine (EMLA) cream Apply to affected area once 30 g 6   Multiple Vitamins-Minerals (MULTIVITAMIN WITH MINERALS) tablet Take 1 tablet by mouth daily.     omeprazole (PRILOSEC) 20 MG capsule Take 20 mg by mouth daily.     ondansetron (ZOFRAN) 8 MG tablet Take 1 tablet (8 mg total) by mouth every 8 (eight) hours as needed for nausea or vomiting. Start on the third day after chemotherapy. 30 tablet 1   potassium chloride SA (KLOR-CON M) 20 MEQ tablet Take 1 tablet (20 mEq total) by mouth 3 (three) times daily. 60 tablet 0   prochlorperazine (COMPAZINE) 10 MG tablet Take 1 tablet (10 mg total) by mouth every 6 (six) hours as needed for nausea or vomiting. 30  tablet 1   No current facility-administered medications for this visit.    VITAL SIGNS: BP 129/87   Pulse 66   Temp 97.9 F (36.6 C) (Tympanic)   Resp 18   Ht 5\' 9"  (1.753 m)   Wt 228 lb (103.4 kg)   BMI 33.67 kg/m  Filed Weights   01/21/23 0929  Weight: 228 lb (103.4 kg)    Estimated body mass index is 33.67 kg/m as calculated from the following:   Height as of this encounter: 5\' 9"  (1.753 m).   Weight as of this encounter: 228 lb  (103.4 kg).  LABS: CBC:    Component Value Date/Time   WBC 7.4 01/13/2023 0820   WBC 2.5 (L) 12/18/2022 1432   HGB 12.7 (L) 01/13/2023 0820   HCT 39.5 01/13/2023 0820   PLT 183 01/13/2023 0820   MCV 86.1 01/13/2023 0820   NEUTROABS 5.6 01/13/2023 0820   LYMPHSABS 0.8 01/13/2023 0820   MONOABS 1.0 01/13/2023 0820   EOSABS 0.1 01/13/2023 0820   BASOSABS 0.0 01/13/2023 0820   Comprehensive Metabolic Panel:    Component Value Date/Time   NA 138 01/13/2023 0820   K 3.5 01/13/2023 0820   CL 104 01/13/2023 0820   CO2 27 01/13/2023 0820   BUN 14 01/13/2023 0820   CREATININE 0.89 01/13/2023 0820   GLUCOSE 102 (H) 01/13/2023 0820   CALCIUM 9.2 01/13/2023 0820   AST 38 01/13/2023 0820   ALT 54 (H) 01/13/2023 0820   ALKPHOS 64 01/13/2023 0820   BILITOT 0.8 01/13/2023 0820   PROT 7.6 01/13/2023 0820   ALBUMIN 4.0 01/13/2023 0820    RADIOGRAPHIC STUDIES: NM PET Image Restage (PS) Skull Base to Thigh (F-18 FDG)  Result Date: 01/15/2023 CLINICAL DATA:  Subsequent treatment strategy for esophageal cancer. EXAM: NUCLEAR MEDICINE PET SKULL BASE TO THIGH TECHNIQUE: 12.4 mCi F-18 FDG was injected intravenously. Full-ring PET imaging was performed from the skull base to thigh after the radiotracer. CT data was obtained and used for attenuation correction and anatomic localization. Fasting blood glucose: 99 mg/dl COMPARISON:  MR abdomen 09/20/2022 and PET 09/05/2022. FINDINGS: Mediastinal blood pool activity: SUV max 2.6 Liver activity: SUV max NA NECK: Small cervical lymph nodes are mildly hypermetabolic. Index left level II lymph node measures 5 mm (4/20), SUV max 3.5. Incidental CT findings: None. CHEST: Decreased hypermetabolism associated with a lower esophageal mass, now SUV max 8.8, compared to 19.3. 7 mm high right paratracheal lymph node (4/45), SUV max 3.0. Incidental CT findings: Right IJ Port-A-Cath terminates at the SVC RA junction. Atherosclerotic calcification of the aorta, aortic  valve and coronary arteries. Heart is at the upper limits of normal in size to mildly enlarged. No pericardial or pleural effusion. ABDOMEN/PELVIS: Enlarging and increasingly hypermetabolic posterior right hepatic lobe mass measures 4.7 x 4.8 cm, SUV max 8.6, previously 4.1. New hypermetabolic right adrenal nodule measures 2.3 x 2.5 cm (4/84), SUV max 12.0. New hypermetabolism in the left adrenal gland, SUV max 4.6. Posterior gastrohepatic ligament lymph node measures 10 mm (4/82), SUV max 6.5. Mildly hypermetabolic inguinal lymph nodes with index lymph node on the right measuring 9 mm (4/148), SUV max 3.2. Incidental CT findings: Subcentimeter low-attenuation lesion in the posterior right hepatic lobe, too small to characterize. Stone in the gallbladder. Right kidney is unremarkable. Stones in the left kidney. Low-attenuation lesions in the left kidney. No specific follow-up necessary. Spleen, pancreas, stomach and bowel are grossly unremarkable. SKELETON: New focal hypermetabolism in the lateral right sixth rib,  SUV max 4.3. Additional hypermetabolic lesions in the proximal femora. Incidental CT findings: Degenerative changes in the spine. IMPRESSION: 1. Interval progression in metastatic esophageal carcinoma as evidenced by hypermetabolic lymph nodes in the neck, chest, abdomen and pelvis, an enlarging right hepatic lobe metastasis, new bilateral adrenal metastases and new osseous metastases. 2. Cholelithiasis. 3. Left renal stones. 4. Aortic atherosclerosis (ICD10-I70.0). Coronary artery calcification. Electronically Signed   By: Leanna Battles M.D.   On: 01/15/2023 08:26    PERFORMANCE STATUS (ECOG) : 1 - Symptomatic but completely ambulatory  Review of Systems Unless otherwise noted, a complete review of systems is negative.  Physical Exam General: NAD HEENT: Inflamed oral mucosa Cardiovascular: regular rate and rhythm Pulmonary: clear ant fields Abdomen: soft, nontender, + bowel sounds GU: no  suprapubic tenderness Extremities: no edema, no joint deformities Skin: no rashes Neurological: nonfocal  IMPRESSION/PLAN: Esophageal adenocarcinoma -on FOLFOX  Mucositis -secondary to chemotherapy.  Will start on Magic mouthwash with lidocaine.  Also recommended trial of baking soda/salt rinses.  If no improvement after several days, could consider dexamethasone oral rinse.  RTC next week to see Dr. Cathie Hoops   Patient expressed understanding and was in agreement with this plan. He also understands that He can call clinic at any time with any questions, concerns, or complaints.   Thank you for allowing me to participate in the care of this very pleasant patient.   Time Total: 15 minutes  Visit consisted of counseling and education dealing with the complex and emotionally intense issues of symptom management in the setting of serious illness.Greater than 50%  of this time was spent counseling and coordinating care related to the above assessment and plan.  Signed by: Laurette Schimke, PhD, NP-C

## 2023-01-21 NOTE — Patient Instructions (Signed)
Mix 1/4 teaspoon of baking soda and 1/8 teaspoon of salt in 1 cup of warm water. Stir it up. Then swish it around in your mouth and spit it out. Do this every 1 to 2 hours during the day

## 2023-01-23 ENCOUNTER — Encounter: Payer: Self-pay | Admitting: Oncology

## 2023-01-23 ENCOUNTER — Other Ambulatory Visit: Payer: Self-pay | Admitting: Oncology

## 2023-01-23 DIAGNOSIS — C159 Malignant neoplasm of esophagus, unspecified: Secondary | ICD-10-CM

## 2023-01-23 NOTE — Progress Notes (Signed)
ON PATHWAY REGIMEN - Gastroesophageal  No Change  Continue With Treatment as Ordered.  Original Decision Date/Time: 10/04/2022 23:05     A cycle is every 14 days:     Oxaliplatin      Leucovorin      Fluorouracil      Fluorouracil   **Always confirm dose/schedule in your pharmacy ordering system**  Patient Characteristics: Distant Metastases (cM1/pM1) / Locally Recurrent Disease, Adenocarcinoma - Esophageal, GE Junction, and Gastric, First Line, HER2 Negative/Unknown, PD?L1 Expression CPS < 5/Negative/Unknown, MSS/pMMR or MSI Unknown Therapeutic Status: Distant Metastases (No Additional Staging) Histology: Adenocarcinoma Disease Classification: Esophageal Line of Therapy: First Line HER2 Status: Negative PD-L1 Expression Status: Awaiting Test Results Microsatellite/Mismatch Repair Status: Unknown Intent of Therapy: Non-Curative / Palliative Intent, Discussed with Patient

## 2023-01-23 NOTE — Progress Notes (Signed)
DISCONTINUE ON PATHWAY REGIMEN - Gastroesophageal     A cycle is every 14 days:     Oxaliplatin      Leucovorin      Fluorouracil      Fluorouracil   **Always confirm dose/schedule in your pharmacy ordering system**  REASON: Disease Progression PRIOR TREATMENT: GEOS3: mFOLFOX6 q14 Days Until Progression or Unacceptable Toxicity TREATMENT RESPONSE: Progressive Disease (PD)  START ON PATHWAY REGIMEN - Gastroesophageal     A cycle is every 14 days:     Nivolumab      Oxaliplatin      Leucovorin      Fluorouracil      Fluorouracil   **Always confirm dose/schedule in your pharmacy ordering system**  Patient Characteristics: Distant Metastases (cM1/pM1) / Locally Recurrent Disease, Adenocarcinoma - Esophageal, GE Junction, and Gastric, First Line, HER2 Negative/Unknown, PD?L1 Expression  PositiveCPS ? 5 Therapeutic Status: Distant Metastases (No Additional Staging) Histology: Adenocarcinoma Disease Classification: Esophageal Line of Therapy: First Line HER2 Status: Negative PD-L1 Expression Status: PD-L1 Expression Positive CPS ? 5 Intent of Therapy: Non-Curative / Palliative Intent, Discussed with Patient

## 2023-01-24 MED FILL — Dexamethasone Sodium Phosphate Inj 100 MG/10ML: INTRAMUSCULAR | Qty: 1 | Status: AC

## 2023-01-27 ENCOUNTER — Inpatient Hospital Stay: Payer: No Typology Code available for payment source | Attending: Oncology | Admitting: Oncology

## 2023-01-27 ENCOUNTER — Inpatient Hospital Stay: Payer: No Typology Code available for payment source | Admitting: Oncology

## 2023-01-27 ENCOUNTER — Other Ambulatory Visit: Payer: Self-pay

## 2023-01-27 ENCOUNTER — Inpatient Hospital Stay: Payer: No Typology Code available for payment source

## 2023-01-27 ENCOUNTER — Encounter: Payer: Self-pay | Admitting: Oncology

## 2023-01-27 VITALS — BP 125/78 | HR 69 | Temp 96.9°F | Resp 18 | Ht 69.0 in | Wt 231.7 lb

## 2023-01-27 DIAGNOSIS — D702 Other drug-induced agranulocytosis: Secondary | ICD-10-CM | POA: Insufficient documentation

## 2023-01-27 DIAGNOSIS — R051 Acute cough: Secondary | ICD-10-CM | POA: Diagnosis not present

## 2023-01-27 DIAGNOSIS — C159 Malignant neoplasm of esophagus, unspecified: Secondary | ICD-10-CM

## 2023-01-27 DIAGNOSIS — G620 Drug-induced polyneuropathy: Secondary | ICD-10-CM | POA: Diagnosis not present

## 2023-01-27 DIAGNOSIS — Z79620 Long term (current) use of immunosuppressive biologic: Secondary | ICD-10-CM | POA: Diagnosis not present

## 2023-01-27 DIAGNOSIS — Z5111 Encounter for antineoplastic chemotherapy: Secondary | ICD-10-CM | POA: Diagnosis not present

## 2023-01-27 DIAGNOSIS — R0981 Nasal congestion: Secondary | ICD-10-CM | POA: Insufficient documentation

## 2023-01-27 DIAGNOSIS — N200 Calculus of kidney: Secondary | ICD-10-CM | POA: Insufficient documentation

## 2023-01-27 DIAGNOSIS — E876 Hypokalemia: Secondary | ICD-10-CM

## 2023-01-27 DIAGNOSIS — K760 Fatty (change of) liver, not elsewhere classified: Secondary | ICD-10-CM | POA: Diagnosis not present

## 2023-01-27 DIAGNOSIS — Z5112 Encounter for antineoplastic immunotherapy: Secondary | ICD-10-CM | POA: Insufficient documentation

## 2023-01-27 DIAGNOSIS — Z7901 Long term (current) use of anticoagulants: Secondary | ICD-10-CM | POA: Diagnosis not present

## 2023-01-27 DIAGNOSIS — Z79631 Long term (current) use of antimetabolite agent: Secondary | ICD-10-CM | POA: Insufficient documentation

## 2023-01-27 DIAGNOSIS — I82621 Acute embolism and thrombosis of deep veins of right upper extremity: Secondary | ICD-10-CM | POA: Diagnosis not present

## 2023-01-27 DIAGNOSIS — R0982 Postnasal drip: Secondary | ICD-10-CM | POA: Diagnosis not present

## 2023-01-27 DIAGNOSIS — C155 Malignant neoplasm of lower third of esophagus: Secondary | ICD-10-CM | POA: Insufficient documentation

## 2023-01-27 DIAGNOSIS — I700 Atherosclerosis of aorta: Secondary | ICD-10-CM | POA: Diagnosis not present

## 2023-01-27 DIAGNOSIS — Z8042 Family history of malignant neoplasm of prostate: Secondary | ICD-10-CM | POA: Insufficient documentation

## 2023-01-27 DIAGNOSIS — C7972 Secondary malignant neoplasm of left adrenal gland: Secondary | ICD-10-CM | POA: Diagnosis not present

## 2023-01-27 DIAGNOSIS — C7951 Secondary malignant neoplasm of bone: Secondary | ICD-10-CM | POA: Insufficient documentation

## 2023-01-27 DIAGNOSIS — C787 Secondary malignant neoplasm of liver and intrahepatic bile duct: Secondary | ICD-10-CM | POA: Insufficient documentation

## 2023-01-27 DIAGNOSIS — Z79890 Hormone replacement therapy (postmenopausal): Secondary | ICD-10-CM | POA: Insufficient documentation

## 2023-01-27 DIAGNOSIS — Z79899 Other long term (current) drug therapy: Secondary | ICD-10-CM | POA: Insufficient documentation

## 2023-01-27 DIAGNOSIS — Z8249 Family history of ischemic heart disease and other diseases of the circulatory system: Secondary | ICD-10-CM | POA: Insufficient documentation

## 2023-01-27 DIAGNOSIS — Z86718 Personal history of other venous thrombosis and embolism: Secondary | ICD-10-CM | POA: Diagnosis not present

## 2023-01-27 DIAGNOSIS — Z803 Family history of malignant neoplasm of breast: Secondary | ICD-10-CM | POA: Insufficient documentation

## 2023-01-27 DIAGNOSIS — I2510 Atherosclerotic heart disease of native coronary artery without angina pectoris: Secondary | ICD-10-CM | POA: Diagnosis not present

## 2023-01-27 DIAGNOSIS — C7971 Secondary malignant neoplasm of right adrenal gland: Secondary | ICD-10-CM | POA: Insufficient documentation

## 2023-01-27 DIAGNOSIS — E669 Obesity, unspecified: Secondary | ICD-10-CM | POA: Diagnosis not present

## 2023-01-27 DIAGNOSIS — T451X5A Adverse effect of antineoplastic and immunosuppressive drugs, initial encounter: Secondary | ICD-10-CM | POA: Diagnosis not present

## 2023-01-27 LAB — CBC WITH DIFFERENTIAL (CANCER CENTER ONLY)
Abs Immature Granulocytes: 0 10*3/uL (ref 0.00–0.07)
Basophils Absolute: 0.1 10*3/uL (ref 0.0–0.1)
Basophils Relative: 2 %
Eosinophils Absolute: 0.1 10*3/uL (ref 0.0–0.5)
Eosinophils Relative: 2 %
HCT: 34.8 % — ABNORMAL LOW (ref 39.0–52.0)
Hemoglobin: 11.5 g/dL — ABNORMAL LOW (ref 13.0–17.0)
Immature Granulocytes: 0 %
Lymphocytes Relative: 14 %
Lymphs Abs: 0.6 10*3/uL — ABNORMAL LOW (ref 0.7–4.0)
MCH: 28.3 pg (ref 26.0–34.0)
MCHC: 33 g/dL (ref 30.0–36.0)
MCV: 85.5 fL (ref 80.0–100.0)
Monocytes Absolute: 0.6 10*3/uL (ref 0.1–1.0)
Monocytes Relative: 14 %
Neutro Abs: 2.9 10*3/uL (ref 1.7–7.7)
Neutrophils Relative %: 68 %
Platelet Count: 141 10*3/uL — ABNORMAL LOW (ref 150–400)
RBC: 4.07 MIL/uL — ABNORMAL LOW (ref 4.22–5.81)
RDW: 18.7 % — ABNORMAL HIGH (ref 11.5–15.5)
WBC Count: 4.3 10*3/uL (ref 4.0–10.5)
nRBC: 0 % (ref 0.0–0.2)

## 2023-01-27 LAB — MAGNESIUM: Magnesium: 1.9 mg/dL (ref 1.7–2.4)

## 2023-01-27 LAB — CMP (CANCER CENTER ONLY)
ALT: 39 U/L (ref 0–44)
AST: 38 U/L (ref 15–41)
Albumin: 3.6 g/dL (ref 3.5–5.0)
Alkaline Phosphatase: 54 U/L (ref 38–126)
Anion gap: 8 (ref 5–15)
BUN: 10 mg/dL (ref 8–23)
CO2: 25 mmol/L (ref 22–32)
Calcium: 8.8 mg/dL — ABNORMAL LOW (ref 8.9–10.3)
Chloride: 105 mmol/L (ref 98–111)
Creatinine: 0.94 mg/dL (ref 0.61–1.24)
GFR, Estimated: >60 mL/min (ref >60)
Glucose, Bld: 129 mg/dL — ABNORMAL HIGH (ref 70–99)
Potassium: 2.9 mmol/L — ABNORMAL LOW (ref 3.5–5.1)
Sodium: 138 mmol/L (ref 135–145)
Total Bilirubin: 0.5 mg/dL (ref 0.3–1.2)
Total Protein: 7.2 g/dL (ref 6.5–8.1)

## 2023-01-27 LAB — TSH: TSH: 1.209 u[IU]/mL (ref 0.350–4.500)

## 2023-01-27 MED ORDER — POTASSIUM CHLORIDE 20 MEQ/100ML IV SOLN
20.0000 meq | Freq: Once | INTRAVENOUS | Status: AC
  Administered 2023-01-27: 20 meq via INTRAVENOUS

## 2023-01-27 MED ORDER — SODIUM CHLORIDE 0.9 % IV SOLN
10.0000 mg | Freq: Once | INTRAVENOUS | Status: AC
  Administered 2023-01-27: 10 mg via INTRAVENOUS
  Filled 2023-01-27: qty 10

## 2023-01-27 MED ORDER — SODIUM CHLORIDE 0.9 % IV SOLN
2400.0000 mg/m2 | INTRAVENOUS | Status: DC
  Administered 2023-01-27: 5000 mg via INTRAVENOUS
  Filled 2023-01-27: qty 100

## 2023-01-27 MED ORDER — SODIUM CHLORIDE 0.9 % IV SOLN
INTRAVENOUS | Status: DC | PRN
  Filled 2023-01-27: qty 250

## 2023-01-27 MED ORDER — PALONOSETRON HCL INJECTION 0.25 MG/5ML: 0.2500 mg | Freq: Once | INTRAVENOUS | Status: DC

## 2023-01-27 MED ORDER — DEXTROSE 5 % IV SOLN
Freq: Once | INTRAVENOUS | Status: DC
  Filled 2023-01-27: qty 250

## 2023-01-27 MED ORDER — LEUCOVORIN CALCIUM INJECTION 350 MG
400.0000 mg/m2 | Freq: Once | INTRAVENOUS | Status: AC
  Administered 2023-01-27: 896 mg via INTRAVENOUS
  Filled 2023-01-27: qty 44.8

## 2023-01-27 MED ORDER — FLUOROURACIL CHEMO INJECTION 2.5 GM/50ML
400.0000 mg/m2 | Freq: Once | INTRAVENOUS | Status: AC
  Administered 2023-01-27: 900 mg via INTRAVENOUS
  Filled 2023-01-27: qty 18

## 2023-01-27 NOTE — Assessment & Plan Note (Signed)
Continue TID.  IV Kcl x 1 today Mag level is normal

## 2023-01-27 NOTE — Patient Instructions (Signed)
[facility] [place] [facility]  Discharge Instructions: Thank you for choosing [facility] to provide your oncology and hematology care.  If you have a lab appointment with the Cancer Center, please go directly to the Cancer Center and check in at the registration area.  Wear comfortable clothing and clothing appropriate for easy access to any Portacath or PICC line.   We strive to give you quality time with your provider. You may need to reschedule your appointment if you arrive late (15 or more minutes).  Arriving late affects you and other patients whose appointments are after yours.  Also, if you miss three or more appointments without notifying the office, you may be dismissed from the clinic at the provider's discretion.      For prescription refill requests, have your pharmacy contact our office and allow 72 hours for refills to be completed.     To help prevent nausea and vomiting after your treatment, we encourage you to take your nausea medication as directed.  BELOW ARE SYMPTOMS THAT SHOULD BE REPORTED IMMEDIATELY: *FEVER GREATER THAN 100.4 F (38 C) OR HIGHER *CHILLS OR SWEATING *NAUSEA AND VOMITING THAT IS NOT CONTROLLED WITH YOUR NAUSEA MEDICATION *UNUSUAL SHORTNESS OF BREATH *UNUSUAL BRUISING OR BLEEDING *URINARY PROBLEMS (pain or burning when urinating, or frequent urination) *BOWEL PROBLEMS (unusual diarrhea, constipation, pain near the anus) TENDERNESS IN MOUTH AND THROAT WITH OR WITHOUT PRESENCE OF ULCERS (sore throat, sores in mouth, or a toothache) UNUSUAL RASH, SWELLING OR PAIN  UNUSUAL VAGINAL DISCHARGE OR ITCHING   Items with * indicate a potential emergency and should be followed up as soon as possible or go to the Emergency Department if any problems should occur.  Please show the CHEMOTHERAPY ALERT CARD or IMMUNOTHERAPY ALERT CARD at check-in to the Emergency Department and triage nurse.  Should you have questions after your visit  or need to cancel or reschedule your appointment, please contact [facility] [place] [facility]  [phone] and follow the prompts.  Office hours are 8:00 a.m. to 4:30 p.m. Monday - Friday. Please note that voicemails left after 4:00 p.m. may not be returned until the following business day.  We are closed weekends and major holidays. You have access to a nurse at all times for urgent questions. Please call the main number to the clinic [phone] and follow the prompts.  For any non-urgent questions, you may also contact your provider using MyChart. We now offer e-Visits for anyone 18 and older to request care online for non-urgent symptoms. For details visit [email or web address].   Also download the MyChart app! Go to the app store, search "MyChart", open the app, select [facility], and log in with your MyChart username and password.    

## 2023-01-27 NOTE — Progress Notes (Signed)
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  Labs are reviewed and discussed with patient. PET showed progression.  Shared decision was made to proceed with 5-FU, CPS 65%, will add nivolumab Q2 weeks.  Rationale and side effects of immunotherapy were reviewed with patient and he agrees with the plan. Plan monitoring CEA, short term CT, If no response, plan to switch to 2nd line treatment. I will hold off oxaliplatin as patient had developed recurrent drug rash that needed high dose steroid.   Hypokalemia Continue TID.  IV Kcl x 1 today Mag level is normal    Encounter for antineoplastic chemotherapy Chemotherapy plan as listed above.   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      Orders Placed This Encounter  Procedures   Potassium    Standing Status:   Future    Standing Expiration Date:   01/29/2024   CBC with Differential (Cancer Center Only)    Standing Status:   Future    Standing Expiration Date:   02/12/2024   CMP (Cancer Center only)    Standing Status:   Future    Standing Expiration Date:   02/12/2024   CEA    Standing Status:   Future    Standing Expiration Date:   02/12/2024   Magnesium    Standing Status:   Future    Number of Occurrences:   1    Standing Expiration Date:   01/27/2024     Follow-up  1 week lab MD FOLFOX D3 pump dc  All questions were answered. The patient knows to call the clinic with any problems, questions or concerns.  Rickard Patience, MD, PhD [facility] Hematology Oncology 01/27/2023   HISTORY OF PRESENTING ILLNESS:  Jonathan [name] 67 y.o. male presents to establish care for esophageal adenocarcinoma I have reviewed his chart and materials related to his cancer extensively and collaborated history with the patient. Summary of oncologic history is as follows: Oncology History  Adenocarcinoma of esophagus (HCC)  08/28/2022 Initial Diagnosis   Adenocarcinoma of esophagus   -Patient has noticed worsening of "food stuck/fullness" sensation since November 2023.  Patient had a barium swallow study which commented on marked mucosal irregularity in the distal esophagus with Broaddus base mural filling defect highly suspicious for malignancy.  Patient establish care with gastroenterology. -08/23/2022, EGD showed gastritis and partially obstructing malignant esophageal tumor in the lower third of the esophagus. Esophagus mass biopsy showed adenocarcinoma.  PD-L1 TPS 65%, HER2 negative.  [facility] NGS showed KRASG12V, CDKN2A, ARID1A, TP53, TMB 5.3, MSI stable.  Stomach biopsy showed gastric mucosa with no specific histology abnormality.  No significant intestinal metaplastic, dysplastic, granular atrophy or increased inflammation.     08/28/2022 Imaging   CT chest abdomen pelvis with contrast showed 1. Distal esophageal primary with gastrohepatic ligament nodal metastasis. 2. 2 right-sided pulmonary nodules, the largest of which measures 5 mm and is new since 2015. Pulmonary metastasis  not be excluded. 3. Anterior right lower lobe volume loss and minimal soft tissue density, favoring atelectasis or scar. Recommend attention on follow-up. 4. Hepatic steatosis 5. Cholelithiasis 6. Left nephrolithiasis 7. Coronary artery atherosclerosis. Aortic Atherosclerosis    08/28/2022 Cancer Staging   Staging form: Esophagus - Adenocarcinoma, AJCC 8th Edition - Clinical stage from 08/28/2022: Stage IVB (cT3, cN1, cM1) - Signed by Rickard Patience, MD on 10/04/2022 Stage prefix: Initial diagnosis   09/06/2022 Imaging   PET scan showed 1. Esophageal primary with gastrohepatic ligament nodal metastasis,as on CT. 2. Focus of hypermetabolism which is favored to registered to the posterior hepatic dome, in the region of subtle heterogeneity on prior diagnostic CT. Suboptimally evaluated secondary to underlying steatosis. Recommend further evaluation with pre and post contrast abdominal MRI to confirm probable metastasis. 3. Incidental findings, including: Left nephrolithiasis.Cholelithiasis. Coronary artery atherosclerosis. Aortic Atherosclerosis    09/14/2022 - 09/14/2022 Chemotherapy   Patient is on Treatment Plan : ESOPHAGUS Carboplatin + Paclitaxel Weekly X 6 Weeks with XRT     09/23/2022 Imaging   MRI abdomen with and without contrast showed 1. Mildly T2 hyperintense segment VII hepatic lesion measuring 2.7 cm with imaging characteristics compatible with metastatic disease. 2. Tiny focus of delayed enhancement in the inferior right lobe of the liver segment VI measuring 8 mm with ill-defined increased T2 signal and subtle corresponding reduced diffusivity, also suspicious for metastatic disease. 3. Partially visualized distal esophageal wall thickening compatible with the patient's known primary neoplasm. 4. Similar size of the 11 mm gastrohepatic ligament lymph node mildly metabolic on prior PET-CT and compatible with local nodal disease involvement. 5. 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 Recommend follow up pre and post-contrast MRI/MRCP in 1 year. 6. Diffuse hepatic steatosis.   10/10/2022 Procedure   LIVER MASS; CT-GUIDED BIOPSY:  - MODERATE TO POORLY DIFFERENTIATED  ADENOCARCINOMA MORPHOLOGICALLY  CONSISTENT WITH METASTASIS FROM PATIENT'S KNOWN ESOPHAGEAL  ADENOCARCINOMA.    10/17/2022 Procedure   Medi port placed by Dr. Wyn Quaker   10/21/2022 - 01/15/2023 Chemotherapy   Patient is on Treatment Plan : ESOPHAGEAL ADENOCARCINOMA FOLFOX q14d x 6 cycles     01/27/2023 -  Chemotherapy   Patient is on Treatment Plan : GASTROESOPHAGEAL FOLFOX + Nivolumab q14d     Patient presents to establish care.  He is not taking PPI. He has intentionally lost some weight. Family history positive for father and paternal uncle with prostate cancer and sister with breast cancer. Denies any routine alcohol use  Right interval jugular vein occlusive DVT, started on Eliquis starter package on 10/28/2022, swelling of neck has improved.    INTERVAL HISTORY Jonathan [name] is a 67 y.o. male who has above history reviewed by me today presents for follow up visit for Stage IV esophageal adenocarcinoma.  He tolerated chemotherapy, except that he again developed itchy rash, milder this time since oxaliplatin has been dose reduced. He used dexamethasone 2-3 days and rash is improved.  He denies any significant abdominal pain, occasionally he feels some discomfort of right upper quadrant. His appetiet is fair and weight is stable.     MEDICAL HISTORY:  Past Medical History:  Diagnosis Date   Arthritis    Complication of anesthesia    OCCURRED ONCE YEARS AGO 1981   Esophageal mass    Hypertension    Hypothyroidism    PONV (postoperative nausea and vomiting)     SURGICAL HISTORY: Past  Surgical History:  Procedure Laterality Date   COLONOSCOPY     ESOPHAGOGASTRODUODENOSCOPY N/A 08/23/2022   Procedure: ESOPHAGOGASTRODUODENOSCOPY (EGD);  Surgeon: Jaynie Collins, DO;  Location: [facility] ENDOSCOPY;  Service: Gastroenterology;  Laterality: N/A;   EUS N/A 09/12/2022   Procedure: FULL UPPER ENDOSCOPIC ULTRASOUND (EUS) RADIAL;  Surgeon: Bearl Mulberry, MD;  Location: [facility]  ENDOSCOPY;  Service: Gastroenterology;  Laterality: N/A;   FINGER SURGERY     PORTA CATH INSERTION N/A 10/17/2022   Procedure: PORTA CATH INSERTION;  Surgeon: Annice Needy, MD;  Location: [facility] INVASIVE CV LAB;  Service: Cardiovascular;  Laterality: N/A;   TENDON REPAIR IN LEFT KNEE      SOCIAL HISTORY: Social History   Socioeconomic History   Marital status: Single    Spouse name: Not on file   Number of children: Not on file   Years of education: Not on file   Highest education level: Not on file  Occupational History   Not on file  Tobacco Use   Smoking status: Never   Smokeless tobacco: Never  Vaping Use   Vaping Use: Never used  Substance and Sexual Activity   Alcohol use: Yes    Comment: OCCASIONALLY   Drug use: Never   Sexual activity: Not on file  Other Topics Concern   Not on file  Social History Narrative   Not on file   Social Determinants of Health   Financial Resource Strain: Not on file  Food Insecurity: No Food Insecurity (08/28/2022)   Hunger Vital Sign    Worried About Running Out of Food in the Last Year: Never true    Ran Out of Food in the Last Year: Never true  Transportation Needs: No Transportation Needs (08/28/2022)   PRAPARE - Administrator, Civil Service (Medical): No    Lack of Transportation (Non-Medical): No  Physical Activity: Not on file  Stress: Not on file  Social Connections: Not on file  Intimate Partner Violence: Not At Risk (08/28/2022)   Humiliation, Afraid, Rape, and Kick questionnaire    Fear of Current or Ex-Partner: No    Emotionally Abused: No    Physically Abused: No    Sexually Abused: No    FAMILY HISTORY: Family History  Problem Relation Age of Onset   Heart attack Mother    Prostate cancer Father    Breast cancer Sister    Prostate cancer Paternal Uncle     ALLERGIES:  has No Known Allergies.  MEDICATIONS:  Current Outpatient Medications  Medication Sig Dispense Refill   acetaminophen (TYLENOL)  650 MG CR tablet Take 1,300 mg by mouth every 8 (eight) hours as needed for pain.     amLODipine (NORVASC) 10 MG tablet Take 10 mg by mouth daily.     atenolol (TENORMIN) 50 MG tablet Take 50 mg by mouth daily.     chlorhexidine (PERIDEX) 0.12 % solution Use as directed 15 mLs in the mouth or throat 2 (two) times daily. 473 mL 1   Cholecalciferol (VITAMIN D3) 125 MCG (5000 UT) CAPS Take 5,000 Units by mouth daily.     dexamethasone (DECADRON) 4 MG tablet Take 2 tablets (8 mg total) by mouth See admin instructions. Take 8mg  daily for 2-3 days after each chemotherapy. 30 tablet 0   ELIQUIS 5 MG TABS tablet Take 1 tablet by mouth twice daily 60 tablet 0   hydrochlorothiazide (HYDRODIURIL) 25 MG tablet Take 25 mg by mouth daily.     hydrocortisone-nystatin-lidocaine in diphenhydrAMINE  liquid Swish and swallow 5 mLs by mouth 3 (three) times daily as needed for mouth pain. 200 mL 2   levothyroxine (SYNTHROID) 125 MCG tablet Take 125 mcg by mouth every morning.     Multiple Vitamins-Minerals (MULTIVITAMIN WITH MINERALS) tablet Take 1 tablet by mouth daily.     omeprazole (PRILOSEC) 20 MG capsule Take 20 mg by mouth daily.     potassium chloride SA (KLOR-CON M) 20 MEQ tablet Take 1 tablet (20 mEq total) by mouth 3 (three) times daily. 60 tablet 0   No current facility-administered medications for this visit.   Facility-Administered Medications Ordered in Other Visits  Medication Dose Route Frequency Provider Last Rate Last Admin   0.9 %  sodium chloride infusion   Intravenous PRN Rickard Patience, MD   Stopped at 01/27/23 1028   dextrose 5 % solution   Intravenous Once Rickard Patience, MD       fluorouracil (ADRUCIL) 5,000 mg in sodium chloride 0.9 % 150 mL chemo infusion  2,400 mg/m2 (Treatment Plan Recorded) Intravenous 1 day or 1 dose Rickard Patience, MD   Infusion Verify at 01/27/23 1037    Review of Systems  Constitutional:  Negative for appetite change, chills, fatigue and fever.  HENT:   Negative for hearing loss  and voice change.   Eyes:  Negative for eye problems and icterus.  Respiratory:  Negative for chest tightness, cough and shortness of breath.   Cardiovascular:  Negative for chest pain and leg swelling.  Gastrointestinal:  Negative for abdominal distention, abdominal pain, nausea and vomiting.  Endocrine: Negative for hot flashes.  Genitourinary:  Negative for difficulty urinating, dysuria and frequency.   Musculoskeletal:  Negative for arthralgias.  Skin:  Positive for rash. Negative for itching.  Neurological:  Negative for light-headedness and numbness.  Hematological:  Negative for adenopathy. Does not bruise/bleed easily.  Psychiatric/Behavioral:  Negative for confusion.      PHYSICAL EXAMINATION: ECOG PERFORMANCE STATUS: 0 - Asymptomatic  Vitals:   01/27/23 0822  BP: 125/78  Pulse: 69  Resp: 18  Temp: (!) 96.9 F (36.1 C)  SpO2: 99%   Filed Weights   01/27/23 0822  Weight: 231 lb 11.2 oz (105.1 kg)    Physical Exam Constitutional:      General: He is not in acute distress.    Appearance: He is obese. He is not diaphoretic.  HENT:     Head: Normocephalic and atraumatic.     Nose: Nose normal.     Mouth/Throat:     Pharynx: No oropharyngeal exudate.  Eyes:     General: No scleral icterus.    Pupils: Pupils are equal, round, and reactive to light.  Cardiovascular:     Rate and Rhythm: Normal rate and regular rhythm.     Heart sounds: No murmur heard. Pulmonary:     Effort: Pulmonary effort is normal. No respiratory distress.     Breath sounds: No rales.  Chest:     Chest wall: No tenderness.  Abdominal:     General: There is no distension.     Palpations: Abdomen is soft.     Tenderness: There is no abdominal tenderness.  Musculoskeletal:        General: Normal range of motion.     Cervical back: Normal range of motion and neck supple.  Skin:    General: Skin is warm and dry.     Findings: No erythema.  Neurological:     Mental Status: He is alert and  oriented to person, place, and time.     Cranial Nerves: No cranial nerve deficit.     Motor: No abnormal muscle tone.     Coordination: Coordination normal.  Psychiatric:        Mood and Affect: Affect normal.      LABORATORY DATA:  I have reviewed the data as listed    Latest Ref Rng & Units 01/27/2023    8:08 AM 01/13/2023    8:20 AM 12/30/2022    7:50 AM  CBC  WBC 4.0 - 10.5 K/uL 4.3  7.4  5.6   Hemoglobin 13.0 - 17.0 g/dL 16.1  09.6  04.5   Hematocrit 39.0 - 52.0 % 34.8  39.5  40.0   Platelets 150 - 400 K/uL 141  183  295       Latest Ref Rng & Units 01/27/2023    8:08 AM 01/13/2023    8:20 AM 12/30/2022    7:50 AM  CMP  Glucose 70 - 99 mg/dL 409  811  914   BUN 8 - 23 mg/dL 10  14  8    Creatinine 0.61 - 1.24 mg/dL 7.82  9.56  2.13   Sodium 135 - 145 mmol/L 138  138  138   Potassium 3.5 - 5.1 mmol/L 2.9  3.5  3.7   Chloride 98 - 111 mmol/L 105  104  104   CO2 22 - 32 mmol/L 25  27  24    Calcium 8.9 - 10.3 mg/dL 8.8  9.2  9.0   Total Protein 6.5 - 8.1 g/dL 7.2  7.6  7.6   Total Bilirubin 0.3 - 1.2 mg/dL 0.5  0.8  0.5   Alkaline Phos 38 - 126 U/L 54  64  59   AST 15 - 41 U/L 38  38  34   ALT 0 - 44 U/L 39  54  38      RADIOGRAPHIC STUDIES: I have personally reviewed the radiological images as listed and agreed with the findings in the report. NM PET Image Restage (PS) Skull Base to Thigh (F-18 FDG)  Result Date: 01/15/2023 CLINICAL DATA:  Subsequent treatment strategy for esophageal cancer. EXAM: NUCLEAR MEDICINE PET SKULL BASE TO THIGH TECHNIQUE: 12.4 mCi F-18 FDG was injected intravenously. Full-ring PET imaging was performed from the skull base to thigh after the radiotracer. CT data was obtained and used for attenuation correction and anatomic localization. Fasting blood glucose: 99 mg/dl COMPARISON:  MR abdomen 09/20/2022 and PET 09/05/2022. FINDINGS: Mediastinal blood pool activity: SUV max 2.6 Liver activity: SUV max NA NECK: Small cervical lymph nodes are mildly  hypermetabolic. Index left level II lymph node measures 5 mm (4/20), SUV max 3.5. Incidental CT findings: None. CHEST: Decreased hypermetabolism associated with a lower esophageal mass, now SUV max 8.8, compared to 19.3. 7 mm high right paratracheal lymph node (4/45), SUV max 3.0. Incidental CT findings: Right IJ Port-A-Cath terminates at the SVC RA junction. Atherosclerotic calcification of the aorta, aortic valve and coronary arteries. Heart is at the upper limits of normal in size to mildly enlarged. No pericardial or pleural effusion. ABDOMEN/PELVIS: Enlarging and increasingly hypermetabolic posterior right hepatic lobe mass measures 4.7 x 4.8 cm, SUV max 8.6, previously 4.1. New hypermetabolic right adrenal nodule measures 2.3 x 2.5 cm (4/84), SUV max 12.0. New hypermetabolism in the left adrenal gland, SUV max 4.6. Posterior gastrohepatic ligament lymph node measures 10 mm (4/82), SUV max 6.5. Mildly hypermetabolic inguinal lymph nodes with index lymph node on  the right measuring 9 mm (4/148), SUV max 3.2. Incidental CT findings: Subcentimeter low-attenuation lesion in the posterior right hepatic lobe, too small to characterize. Stone in the gallbladder. Right kidney is unremarkable. Stones in the left kidney. Low-attenuation lesions in the left kidney. No specific follow-up necessary. Spleen, pancreas, stomach and bowel are grossly unremarkable. SKELETON: New focal hypermetabolism in the lateral right sixth rib, SUV max 4.3. Additional hypermetabolic lesions in the proximal femora. Incidental CT findings: Degenerative changes in the spine. IMPRESSION: 1. Interval progression in metastatic esophageal carcinoma as evidenced by hypermetabolic lymph nodes in the neck, chest, abdomen and pelvis, an enlarging right hepatic lobe metastasis, new bilateral adrenal metastases and new osseous metastases. 2. Cholelithiasis. 3. Left renal stones. 4. Aortic atherosclerosis (ICD10-I70.0). Coronary artery calcification.  Electronically Signed   By: Leanna Battles M.D.   On: 01/15/2023 08:26

## 2023-01-27 NOTE — Assessment & Plan Note (Addendum)
Stage IV esophageal adenocarcinoma, liver metastatic disease I recommend systematic palliative chemotherapy with FOLFOX [Oxaliplatin 75mg /m2 while on RT] .  Labs are reviewed and discussed with patient. PET showed progression.  Shared decision was made to proceed with 5-FU, CPS 65%, will add nivolumab Q2 weeks.  Rationale and side effects of immunotherapy were reviewed with patient and he agrees with the plan. Plan monitoring CEA, short term CT, If no response, plan to switch to 2nd line treatment. I will hold off oxaliplatin as patient had developed recurrent drug rash that needed high dose steroid.

## 2023-01-27 NOTE — Assessment & Plan Note (Signed)
Continue Eliquis 5mg BID 

## 2023-01-27 NOTE — Progress Notes (Signed)
No concerns for the provider today. 

## 2023-01-27 NOTE — Assessment & Plan Note (Signed)
Grade 1-2, observation.

## 2023-01-27 NOTE — Assessment & Plan Note (Signed)
Chemotherapy plan as listed above 

## 2023-01-28 LAB — T4: T4, Total: 9 ug/dL (ref 4.5–12.0)

## 2023-01-29 ENCOUNTER — Inpatient Hospital Stay: Payer: No Typology Code available for payment source

## 2023-01-29 ENCOUNTER — Other Ambulatory Visit: Payer: No Typology Code available for payment source

## 2023-01-29 VITALS — BP 112/73 | HR 71 | Temp 99.4°F | Resp 20

## 2023-01-29 DIAGNOSIS — Z5112 Encounter for antineoplastic immunotherapy: Secondary | ICD-10-CM | POA: Diagnosis not present

## 2023-01-29 DIAGNOSIS — C159 Malignant neoplasm of esophagus, unspecified: Secondary | ICD-10-CM

## 2023-01-29 LAB — POTASSIUM: Potassium: 3 mmol/L — ABNORMAL LOW (ref 3.5–5.1)

## 2023-01-29 MED ORDER — ALTEPLASE 2 MG IJ SOLR
2.0000 mg | Freq: Once | INTRAMUSCULAR | Status: AC | PRN
  Administered 2023-01-29: 2 mg
  Filled 2023-01-29: qty 2

## 2023-01-29 MED ORDER — SODIUM CHLORIDE 0.9 % IV SOLN
INTRAVENOUS | Status: DC
  Filled 2023-01-29: qty 250

## 2023-01-29 MED ORDER — SODIUM CHLORIDE 0.9 % IV SOLN
240.0000 mg | Freq: Once | INTRAVENOUS | Status: AC
  Administered 2023-01-29: 240 mg via INTRAVENOUS
  Filled 2023-01-29: qty 24

## 2023-01-29 MED ORDER — HEPARIN SOD (PORK) LOCK FLUSH 100 UNIT/ML IV SOLN
500.0000 [IU] | Freq: Once | INTRAVENOUS | Status: DC | PRN
  Filled 2023-01-29: qty 5

## 2023-01-29 NOTE — Patient Instructions (Signed)
[facility]  Discharge Instructions: Thank you for choosing [facility] to provide your oncology and hematology care.  If you have a lab appointment with the Cancer Center, please go directly to the Cancer Center and check in at the registration area.  Wear comfortable clothing and clothing appropriate for easy access to any Portacath or PICC line.   We strive to give you quality time with your provider. You may need to reschedule your appointment if you arrive late (15 or more minutes).  Arriving late affects you and other patients whose appointments are after yours.  Also, if you miss three or more appointments without notifying the office, you may be dismissed from the clinic at the provider's discretion.      For prescription refill requests, have your pharmacy contact our office and allow 72 hours for refills to be completed.    Today you received the following chemotherapy and/or immunotherapy agents Nivolumab.      To help prevent nausea and vomiting after your treatment, we encourage you to take your nausea medication as directed.  BELOW ARE SYMPTOMS THAT SHOULD BE REPORTED IMMEDIATELY: *FEVER GREATER THAN 100.4 F (38 C) OR HIGHER *CHILLS OR SWEATING *NAUSEA AND VOMITING THAT IS NOT CONTROLLED WITH YOUR NAUSEA MEDICATION *UNUSUAL SHORTNESS OF BREATH *UNUSUAL BRUISING OR BLEEDING *URINARY PROBLEMS (pain or burning when urinating, or frequent urination) *BOWEL PROBLEMS (unusual diarrhea, constipation, pain near the anus) TENDERNESS IN MOUTH AND THROAT WITH OR WITHOUT PRESENCE OF ULCERS (sore throat, sores in mouth, or a toothache) UNUSUAL RASH, SWELLING OR PAIN  UNUSUAL VAGINAL DISCHARGE OR ITCHING   Items with * indicate a potential emergency and should be followed up as soon as possible or go to the Emergency Department if any problems should occur.  Please show the CHEMOTHERAPY ALERT CARD or IMMUNOTHERAPY ALERT CARD at check-in to  the Emergency Department and triage nurse.  Should you have questions after your visit or need to cancel or reschedule your appointment, please contact [place] [facility]  [phone] and follow the prompts.  Office hours are 8:00 a.m. to 4:30 p.m. Monday - Friday. Please note that voicemails left after 4:00 p.m. may not be returned until the following business day.  We are closed weekends and major holidays. You have access to a nurse at all times for urgent questions. Please call the main number to the clinic [phone] and follow the prompts.  For any non-urgent questions, you may also contact your provider using MyChart. We now offer e-Visits for anyone 63 and older to request care online for non-urgent symptoms. For details visit [email or web address].   Also download the MyChart app! Go to the app store, search "MyChart", open the app, select [email or web address], and log in with your MyChart username and password.

## 2023-02-04 DIAGNOSIS — M9902 Segmental and somatic dysfunction of thoracic region: Secondary | ICD-10-CM | POA: Diagnosis not present

## 2023-02-04 DIAGNOSIS — M9901 Segmental and somatic dysfunction of cervical region: Secondary | ICD-10-CM | POA: Diagnosis not present

## 2023-02-04 DIAGNOSIS — M9903 Segmental and somatic dysfunction of lumbar region: Secondary | ICD-10-CM | POA: Diagnosis not present

## 2023-02-04 DIAGNOSIS — M62830 Muscle spasm of back: Secondary | ICD-10-CM | POA: Diagnosis not present

## 2023-02-06 ENCOUNTER — Inpatient Hospital Stay: Payer: No Typology Code available for payment source

## 2023-02-06 NOTE — Progress Notes (Signed)
Nutrition Follow-up:  Patient with esophageal cancer.  CT with progression per MD note and treatment changed to 5-FU and nivolumab.    Spoke with patient via phone for nutrition follow-up.  Patient reports that foods are going down without difficulty.  Reports that he is eating but not eating the volume as he was before treatment.  Continues with sinus drainage that he feels sometimes makes him nauseated.  Mouth sores are better.  Sometimes does not eat much for breakfast but usually able to eat good lunch and supper.  Drinks protein shakes at times    Medications: reviewed  Labs: reviewed  Anthropometrics:   Weight 231 lb 11.2 oz on 7/1 236 lb 14.4 oz on 5/13 240 lb 1.6 oz on 4/22 244 lb 1.6 oz on 4/9 265 lb on 1/31   NUTRITION DIAGNOSIS: Inadequate oral intake stable    INTERVENTION:  Continue high calorie, high protein foods for weight maintenance Continue oral nutrition supplement for added calories    MONITORING, EVALUATION, GOAL: weight trends, intake   NEXT VISIT: Wednesday, July 31 during infusion  Jonathan Mccauslin B. Freida [name], RD, LDN Registered Dietitian [phone]

## 2023-02-10 ENCOUNTER — Ambulatory Visit: Payer: No Typology Code available for payment source

## 2023-02-10 ENCOUNTER — Other Ambulatory Visit: Payer: No Typology Code available for payment source

## 2023-02-10 ENCOUNTER — Ambulatory Visit: Payer: No Typology Code available for payment source | Admitting: Oncology

## 2023-02-12 ENCOUNTER — Inpatient Hospital Stay: Payer: No Typology Code available for payment source

## 2023-02-12 ENCOUNTER — Encounter: Payer: Self-pay | Admitting: Oncology

## 2023-02-12 ENCOUNTER — Inpatient Hospital Stay: Payer: No Typology Code available for payment source | Admitting: Oncology

## 2023-02-12 VITALS — BP 121/83 | HR 77 | Temp 98.9°F | Resp 18

## 2023-02-12 VITALS — BP 117/77 | HR 79 | Temp 97.6°F | Resp 16 | Wt 227.0 lb

## 2023-02-12 DIAGNOSIS — C159 Malignant neoplasm of esophagus, unspecified: Secondary | ICD-10-CM

## 2023-02-12 DIAGNOSIS — T451X5A Adverse effect of antineoplastic and immunosuppressive drugs, initial encounter: Secondary | ICD-10-CM

## 2023-02-12 DIAGNOSIS — E876 Hypokalemia: Secondary | ICD-10-CM | POA: Diagnosis not present

## 2023-02-12 DIAGNOSIS — Z5111 Encounter for antineoplastic chemotherapy: Secondary | ICD-10-CM | POA: Diagnosis not present

## 2023-02-12 DIAGNOSIS — I82621 Acute embolism and thrombosis of deep veins of right upper extremity: Secondary | ICD-10-CM

## 2023-02-12 DIAGNOSIS — G620 Drug-induced polyneuropathy: Secondary | ICD-10-CM

## 2023-02-12 DIAGNOSIS — Z5112 Encounter for antineoplastic immunotherapy: Secondary | ICD-10-CM | POA: Diagnosis not present

## 2023-02-12 DIAGNOSIS — R0981 Nasal congestion: Secondary | ICD-10-CM

## 2023-02-12 LAB — CBC WITH DIFFERENTIAL (CANCER CENTER ONLY)
Abs Immature Granulocytes: 0.01 10*3/uL (ref 0.00–0.07)
Basophils Absolute: 0.1 10*3/uL (ref 0.0–0.1)
Basophils Relative: 1 %
Eosinophils Absolute: 0.2 10*3/uL (ref 0.0–0.5)
Eosinophils Relative: 3 %
HCT: 34.2 % — ABNORMAL LOW (ref 39.0–52.0)
Hemoglobin: 11.2 g/dL — ABNORMAL LOW (ref 13.0–17.0)
Immature Granulocytes: 0 %
Lymphocytes Relative: 14 %
Lymphs Abs: 0.7 10*3/uL (ref 0.7–4.0)
MCH: 28.4 pg (ref 26.0–34.0)
MCHC: 32.7 g/dL (ref 30.0–36.0)
MCV: 86.8 fL (ref 80.0–100.0)
Monocytes Absolute: 0.7 10*3/uL (ref 0.1–1.0)
Monocytes Relative: 14 %
Neutro Abs: 3.4 10*3/uL (ref 1.7–7.7)
Neutrophils Relative %: 68 %
Platelet Count: 258 10*3/uL (ref 150–400)
RBC: 3.94 MIL/uL — ABNORMAL LOW (ref 4.22–5.81)
RDW: 17.3 % — ABNORMAL HIGH (ref 11.5–15.5)
WBC Count: 5.1 10*3/uL (ref 4.0–10.5)
nRBC: 0 % (ref 0.0–0.2)

## 2023-02-12 LAB — CMP (CANCER CENTER ONLY)
ALT: 26 U/L (ref 0–44)
AST: 33 U/L (ref 15–41)
Albumin: 3.4 g/dL — ABNORMAL LOW (ref 3.5–5.0)
Alkaline Phosphatase: 62 U/L (ref 38–126)
Anion gap: 9 (ref 5–15)
BUN: 11 mg/dL (ref 8–23)
CO2: 25 mmol/L (ref 22–32)
Calcium: 8.9 mg/dL (ref 8.9–10.3)
Chloride: 101 mmol/L (ref 98–111)
Creatinine: 1 mg/dL (ref 0.61–1.24)
GFR, Estimated: >60 mL/min (ref >60)
Glucose, Bld: 133 mg/dL — ABNORMAL HIGH (ref 70–99)
Potassium: 3 mmol/L — ABNORMAL LOW (ref 3.5–5.1)
Sodium: 135 mmol/L (ref 135–145)
Total Bilirubin: 0.6 mg/dL (ref 0.3–1.2)
Total Protein: 7.7 g/dL (ref 6.5–8.1)

## 2023-02-12 MED ORDER — SODIUM CHLORIDE 0.9 % IV SOLN
INTRAVENOUS | Status: DC
  Filled 2023-02-12: qty 250

## 2023-02-12 MED ORDER — SODIUM CHLORIDE 0.9 % IV SOLN
240.0000 mg | Freq: Once | INTRAVENOUS | Status: AC
  Administered 2023-02-12: 240 mg via INTRAVENOUS
  Filled 2023-02-12: qty 24

## 2023-02-12 MED ORDER — POTASSIUM CHLORIDE CRYS ER 20 MEQ PO TBCR: 40.0000 meq | EXTENDED_RELEASE_TABLET | Freq: Two times a day (BID) | ORAL | 0 refills | Status: DC

## 2023-02-12 MED ORDER — MONTELUKAST SODIUM 10 MG PO TABS: 10.0000 mg | ORAL_TABLET | Freq: Every day | ORAL | 0 refills | Status: DC

## 2023-02-12 MED ORDER — SODIUM CHLORIDE 0.9 % IV SOLN
10.0000 mg | Freq: Once | INTRAVENOUS | Status: AC
  Administered 2023-02-12: 10 mg via INTRAVENOUS
  Filled 2023-02-12: qty 1

## 2023-02-12 MED ORDER — SODIUM CHLORIDE 0.9 % IV SOLN
400.0000 mg/m2 | Freq: Once | INTRAVENOUS | Status: AC
  Administered 2023-02-12: 896 mg via INTRAVENOUS
  Filled 2023-02-12: qty 44.8

## 2023-02-12 MED ORDER — AZELASTINE HCL 0.1 % NA SOLN: 1.0000 | Freq: Two times a day (BID) | NASAL | 1 refills | Status: DC

## 2023-02-12 MED ORDER — LEUCOVORIN CALCIUM INJECTION 350 MG: 400.0000 mg/m2 | Freq: Once | INTRAVENOUS | Status: DC

## 2023-02-12 MED ORDER — SODIUM CHLORIDE 0.9 % IV SOLN
2400.0000 mg/m2 | INTRAVENOUS | Status: DC
  Administered 2023-02-12: 5000 mg via INTRAVENOUS
  Filled 2023-02-12: qty 100

## 2023-02-12 MED ORDER — FLUOROURACIL CHEMO INJECTION 2.5 GM/50ML
400.0000 mg/m2 | Freq: Once | INTRAVENOUS | Status: AC
  Administered 2023-02-12: 900 mg via INTRAVENOUS
  Filled 2023-02-12: qty 18

## 2023-02-12 MED ORDER — POTASSIUM CHLORIDE 20 MEQ/100ML IV SOLN
20.0000 meq | Freq: Once | INTRAVENOUS | Status: AC
  Administered 2023-02-12: 20 meq via INTRAVENOUS

## 2023-02-12 MED ORDER — PALONOSETRON HCL INJECTION 0.25 MG/5ML
0.2500 mg | Freq: Once | INTRAVENOUS | Status: AC
  Administered 2023-02-12: 0.25 mg via INTRAVENOUS
  Filled 2023-02-12: qty 5

## 2023-02-12 NOTE — Assessment & Plan Note (Addendum)
Recommend to increase oral KCL to BID. .  IV Kcl x 1 today

## 2023-02-12 NOTE — Progress Notes (Signed)
Hematology/Oncology Progress note [phone] [phone]        REFERRING PROVIDER: Rickard Patience, MD  CHIEF COMPLAINTS/PURPOSE OF CONSULTATION:  Stage IV Esophageal adenocarcinoma  ASSESSMENT & PLAN:   Cancer Staging  Adenocarcinoma of esophagus [facility]) Staging form: Esophagus - Adenocarcinoma, AJCC 8th Edition - Clinical stage from 08/28/2022: Stage IVB (cT3, cN1, cM1) - Signed by Rickard Patience, MD on 10/04/2022   Adenocarcinoma of esophagus (HCC) Stage IV esophageal adenocarcinoma, liver metastatic disease I recommend systematic palliative chemotherapy with FOLFOX [Oxaliplatin 75mg /m2 while on RT] .  Labs are reviewed and discussed with patient. PET showed progression.  Shared decision was made to proceed with 5-FU nivolumab Q2 weeks.   Plan monitoring CEA, short term CT, If no response, plan to switch to 2nd line treatment. I will hold off oxaliplatin as patient had developed recurrent drug rash that needed high dose steroid.   Hypokalemia Recommend to increase oral KCL to BID. .  IV Kcl x 1 today    Encounter for antineoplastic chemotherapy Chemotherapy plan as listed above.   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   Sinus congestion Sinus congestion with postnasal drip which triggers cough. Recommend Flonase nasal spray as well as adding azelastine nasal spray Recommend trials of Singulair 10 mg daily     Orders Placed This Encounter  Procedures   CEA    Standing Status:   Future    Standing Expiration Date:   02/26/2024   CBC with Differential (Cancer Center Only)    Standing Status:   Future    Standing Expiration Date:   02/26/2024   CMP (Cancer Center only)    Standing Status:   Future    Standing Expiration Date:   02/26/2024   T4    Standing Status:   Future    Standing Expiration Date:   02/26/2024   TSH    Standing Status:   Future    Standing  Expiration Date:   02/26/2024    Follow-up   All questions were answered. The patient knows to call the clinic with any problems, questions or concerns.  Rickard Patience, MD, PhD [facility] Hematology Oncology 02/12/2023   HISTORY OF PRESENTING ILLNESS:  Jonathan [name] 67 y.o. male presents to establish care for esophageal adenocarcinoma I have reviewed his chart and materials related to his cancer extensively and collaborated history with the patient. Summary of oncologic history is as follows: Oncology History  Adenocarcinoma of esophagus (HCC)  08/28/2022 Initial Diagnosis   Adenocarcinoma of esophagus   -Patient has noticed worsening of "food stuck/fullness" sensation since November 2023.  Patient had a barium swallow study which commented on marked mucosal irregularity in the distal esophagus with Broaddus base mural filling defect highly suspicious for malignancy.  Patient establish care with gastroenterology. -08/23/2022, EGD showed gastritis and partially obstructing malignant esophageal tumor in the lower third of the esophagus. Esophagus mass biopsy showed adenocarcinoma.  PD-L1 TPS 65%, HER2 negative.  [facility] NGS showed KRASG12V, CDKN2A, ARID1A, TP53, TMB 5.3, MSI stable.  Stomach biopsy showed gastric mucosa with no specific histology abnormality.  No significant intestinal metaplastic, dysplastic, granular atrophy or increased inflammation.     08/28/2022 Imaging   CT chest abdomen pelvis with contrast showed 1. Distal esophageal primary with gastrohepatic ligament nodal metastasis. 2. 2 right-sided pulmonary nodules, the largest of which measures 5 mm and is new since 2015. Pulmonary metastasis not be excluded. 3. Anterior  right lower lobe volume loss and minimal soft tissue density, favoring atelectasis or scar. Recommend attention on follow-up. 4. Hepatic steatosis 5. Cholelithiasis 6. Left nephrolithiasis 7. Coronary artery atherosclerosis. Aortic Atherosclerosis    08/28/2022 Cancer Staging   Staging form: Esophagus - Adenocarcinoma, AJCC 8th Edition - Clinical stage from 08/28/2022: Stage IVB (cT3, cN1, cM1) - Signed by Rickard Patience, MD on 10/04/2022 Stage prefix: Initial diagnosis   09/06/2022 Imaging   PET scan showed 1. Esophageal primary with gastrohepatic ligament nodal metastasis,as on CT. 2. Focus of hypermetabolism which is favored to registered to the posterior hepatic dome, in the region of subtle heterogeneity on prior diagnostic CT. Suboptimally evaluated secondary to underlying steatosis. Recommend further evaluation with pre and post contrast abdominal MRI to confirm probable metastasis. 3. Incidental findings, including: Left nephrolithiasis.Cholelithiasis. Coronary artery atherosclerosis. Aortic Atherosclerosis    09/14/2022 - 09/14/2022 Chemotherapy   Patient is on Treatment Plan : ESOPHAGUS Carboplatin + Paclitaxel Weekly X 6 Weeks with XRT     09/23/2022 Imaging   MRI abdomen with and without contrast showed 1. Mildly T2 hyperintense segment VII hepatic lesion measuring 2.7 cm with imaging characteristics compatible with metastatic disease. 2. Tiny focus of delayed enhancement in the inferior right lobe of the liver segment VI measuring 8 mm with ill-defined increased T2 signal and subtle corresponding reduced diffusivity, also suspicious for metastatic disease. 3. Partially visualized distal esophageal wall thickening compatible with the patient's known primary neoplasm. 4. Similar size of the 11 mm gastrohepatic ligament lymph node mildly metabolic on prior PET-CT and compatible with local nodal disease involvement. 5. 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 Recommend follow up pre and post-contrast MRI/MRCP in 1 year. 6. Diffuse hepatic steatosis.   10/10/2022 Procedure   LIVER MASS; CT-GUIDED BIOPSY:  - MODERATE TO POORLY DIFFERENTIATED  ADENOCARCINOMA MORPHOLOGICALLY  CONSISTENT WITH METASTASIS FROM PATIENT'S KNOWN ESOPHAGEAL  ADENOCARCINOMA.    10/17/2022 Procedure   Medi port placed by Dr. Wyn Quaker   10/21/2022 - 01/15/2023 Chemotherapy   Patient is on Treatment Plan : ESOPHAGEAL ADENOCARCINOMA FOLFOX q14d x 6 cycles     01/15/2023 Imaging   PET showed  1. Interval progression in metastatic esophageal carcinoma as evidenced by hypermetabolic lymph nodes in the neck, chest, abdomen and pelvis, an enlarging right hepatic lobe metastasis, new bilateral adrenal metastases and new osseous metastases. 2. Cholelithiasis. 3. Left renal stones. 4. Aortic atherosclerosis (ICD10-I70.0). Coronary artery calcification.    01/27/2023 -  Chemotherapy   Patient is on Treatment Plan : GASTROESOPHAGEAL FOLFOX + Nivolumab q14d     Patient presents to establish care.  He is not taking PPI. He has intentionally lost some weight. Family history positive for father and paternal uncle with prostate cancer and sister with breast cancer. Denies any routine alcohol use  Right interval jugular vein occlusive DVT, started on Eliquis starter package on 10/28/2022, swelling of neck has improved.    INTERVAL HISTORY Jonathan [name] is a 67 y.o. male who has above history reviewed by me today presents for follow up visit for Stage IV esophageal adenocarcinoma.  He feels more fatigued.  Increased sinus drainage and cough.  Postnasal drip    MEDICAL HISTORY:  Past Medical History:  Diagnosis Date   Arthritis    Complication of anesthesia    OCCURRED ONCE YEARS AGO 1981   Esophageal mass    Hypertension    Hypothyroidism  PONV (postoperative nausea and vomiting)     SURGICAL HISTORY: Past Surgical History:  Procedure Laterality Date   COLONOSCOPY     ESOPHAGOGASTRODUODENOSCOPY N/A 08/23/2022   Procedure: ESOPHAGOGASTRODUODENOSCOPY (EGD);  Surgeon: Jaynie Collins, DO;  Location: [facility] ENDOSCOPY;  Service: Gastroenterology;   Laterality: N/A;   EUS N/A 09/12/2022   Procedure: FULL UPPER ENDOSCOPIC ULTRASOUND (EUS) RADIAL;  Surgeon: Bearl Mulberry, MD;  Location: [facility] ENDOSCOPY;  Service: Gastroenterology;  Laterality: N/A;   FINGER SURGERY     PORTA CATH INSERTION N/A 10/17/2022   Procedure: PORTA CATH INSERTION;  Surgeon: Annice Needy, MD;  Location: [facility] INVASIVE CV LAB;  Service: Cardiovascular;  Laterality: N/A;   TENDON REPAIR IN LEFT KNEE      SOCIAL HISTORY: Social History   Socioeconomic History   Marital status: Single    Spouse name: Not on file   Number of children: Not on file   Years of education: Not on file   Highest education level: Not on file  Occupational History   Not on file  Tobacco Use   Smoking status: Never   Smokeless tobacco: Never  Vaping Use   Vaping status: Never Used  Substance and Sexual Activity   Alcohol use: Yes    Comment: OCCASIONALLY   Drug use: Never   Sexual activity: Not on file  Other Topics Concern   Not on file  Social History Narrative   Not on file   Social Determinants of Health   Financial Resource Strain: Low Risk  (10/29/2022)   Received from [facility], [facility]   Overall Financial Resource Strain (CARDIA)    Difficulty of Paying Living Expenses: Not hard at all  Food Insecurity: No Food Insecurity (10/29/2022)   Received from [facility], [facility]   Hunger Vital Sign    Worried About Running Out of Food in the Last Year: Never true    Ran Out of Food in the Last Year: Never true  Transportation Needs: No Transportation Needs (10/29/2022)   Received from [facility], [facility]   [facility] - Transportation    In the past 12 months, has lack of transportation kept you from medical appointments or from getting medications?: No    Lack of Transportation (Non-Medical): No  Physical Activity: Not on file  Stress: Not on file   Social Connections: Not on file  Intimate Partner Violence: Not At Risk (08/28/2022)   Humiliation, Afraid, Rape, and Kick questionnaire    Fear of Current or Ex-Partner: No    Emotionally Abused: No    Physically Abused: No    Sexually Abused: No    FAMILY HISTORY: Family History  Problem Relation Age of Onset   Heart attack Mother    Prostate cancer Father    Breast cancer Sister    Prostate cancer Paternal Uncle     ALLERGIES:  has No Known Allergies.  MEDICATIONS:  Current Outpatient Medications  Medication Sig Dispense Refill   acetaminophen (TYLENOL) 650 MG CR tablet Take 1,300 mg by mouth every 8 (eight) hours as needed for pain.     amLODipine (NORVASC) 10 MG tablet Take 10 mg by mouth daily.     atenolol (TENORMIN) 50 MG tablet Take 50 mg by mouth daily.     azelastine (ASTELIN) 0.1 % nasal spray Place 1 spray into both nostrils 2 (two) times daily. Use in each nostril as directed 30 mL 1  chlorhexidine (PERIDEX) 0.12 % solution Use as directed 15 mLs in the mouth or throat 2 (two) times daily. 473 mL 1   Cholecalciferol (VITAMIN D3) 125 MCG (5000 UT) CAPS Take 5,000 Units by mouth daily.     dexamethasone (DECADRON) 4 MG tablet Take 2 tablets (8 mg total) by mouth See admin instructions. Take 8mg  daily for 2-3 days after each chemotherapy. 30 tablet 0   ELIQUIS 5 MG TABS tablet Take 1 tablet by mouth twice daily 60 tablet 0   hydrochlorothiazide (HYDRODIURIL) 25 MG tablet Take 25 mg by mouth daily.     hydrocortisone-nystatin-lidocaine in diphenhydrAMINE liquid Swish and swallow 5 mLs by mouth 3 (three) times daily as needed for mouth pain. 200 mL 2   levothyroxine (SYNTHROID) 125 MCG tablet Take 125 mcg by mouth every morning.     montelukast (SINGULAIR) 10 MG tablet Take 1 tablet (10 mg total) by mouth at bedtime. 20 tablet 0   Multiple Vitamins-Minerals (MULTIVITAMIN WITH MINERALS) tablet Take 1 tablet by mouth daily.     omeprazole (PRILOSEC) 20 MG capsule Take 20  mg by mouth daily.     potassium chloride SA (KLOR-CON M) 20 MEQ tablet Take 2 tablets (40 mEq total) by mouth 2 (two) times daily. 120 tablet 0   No current facility-administered medications for this visit.   Facility-Administered Medications Ordered in Other Visits  Medication Dose Route Frequency Provider Last Rate Last Admin   0.9 %  sodium chloride infusion   Intravenous Continuous Rickard Patience, MD   Stopped at 02/12/23 1110   fluorouracil (ADRUCIL) 5,000 mg in sodium chloride 0.9 % 150 mL chemo infusion  2,400 mg/m2 (Treatment Plan Recorded) Intravenous 1 day or 1 dose Rickard Patience, MD   Infusion Verify at 02/12/23 1203    Review of Systems  Constitutional:  Negative for appetite change, chills, fatigue and fever.  HENT:   Negative for hearing loss and voice change.   Eyes:  Negative for eye problems and icterus.  Respiratory:  Negative for chest tightness, cough and shortness of breath.   Cardiovascular:  Negative for chest pain and leg swelling.  Gastrointestinal:  Negative for abdominal distention, abdominal pain, nausea and vomiting.  Endocrine: Negative for hot flashes.  Genitourinary:  Negative for difficulty urinating, dysuria and frequency.   Musculoskeletal:  Negative for arthralgias.  Skin:  Positive for rash. Negative for itching.  Neurological:  Negative for light-headedness and numbness.  Hematological:  Negative for adenopathy. Does not bruise/bleed easily.  Psychiatric/Behavioral:  Negative for confusion.      PHYSICAL EXAMINATION: ECOG PERFORMANCE STATUS: 0 - Asymptomatic  Vitals:   02/12/23 0848  BP: 117/77  Pulse: 79  Resp: 16  Temp: 97.6 F (36.4 C)  SpO2: 97%   Filed Weights   02/12/23 0848  Weight: 227 lb (103 kg)    Physical Exam Constitutional:      General: He is not in acute distress.    Appearance: He is obese. He is not diaphoretic.  HENT:     Head: Normocephalic and atraumatic.  Eyes:     General: No scleral icterus.    Pupils: Pupils are  equal, round, and reactive to light.  Cardiovascular:     Rate and Rhythm: Normal rate and regular rhythm.     Heart sounds: No murmur heard. Pulmonary:     Effort: Pulmonary effort is normal. No respiratory distress.     Breath sounds: No rales.  Chest:     Chest wall: No  tenderness.  Abdominal:     General: There is no distension.     Palpations: Abdomen is soft.     Tenderness: There is no abdominal tenderness.  Musculoskeletal:        General: Normal range of motion.     Cervical back: Normal range of motion and neck supple.  Skin:    General: Skin is warm and dry.     Findings: No erythema.  Neurological:     Mental Status: He is alert and oriented to person, place, and time. Mental status is at baseline.     Cranial Nerves: No cranial nerve deficit.     Motor: No abnormal muscle tone.     Coordination: Coordination normal.  Psychiatric:        Mood and Affect: Mood and affect normal.      LABORATORY DATA:  I have reviewed the data as listed    Latest Ref Rng & Units 02/12/2023    8:02 AM 01/27/2023    8:08 AM 01/13/2023    8:20 AM  CBC  WBC 4.0 - 10.5 K/uL 5.1  4.3  7.4   Hemoglobin 13.0 - 17.0 g/dL 16.1  09.6  04.5   Hematocrit 39.0 - 52.0 % 34.2  34.8  39.5   Platelets 150 - 400 K/uL 258  141  183       Latest Ref Rng & Units 02/12/2023    8:02 AM 01/29/2023   10:47 AM 01/27/2023    8:08 AM  CMP  Glucose 70 - 99 mg/dL 409   811   BUN 8 - 23 mg/dL 11   10   Creatinine 9.14 - 1.24 mg/dL 7.82   9.56   Sodium 213 - 145 mmol/L 135   138   Potassium 3.5 - 5.1 mmol/L 3.0  3.0  2.9   Chloride 98 - 111 mmol/L 101   105   CO2 22 - 32 mmol/L 25   25   Calcium 8.9 - 10.3 mg/dL 8.9   8.8   Total Protein 6.5 - 8.1 g/dL 7.7   7.2   Total Bilirubin 0.3 - 1.2 mg/dL 0.6   0.5   Alkaline Phos 38 - 126 U/L 62   54   AST 15 - 41 U/L 33   38   ALT 0 - 44 U/L 26   39      RADIOGRAPHIC STUDIES: I have personally reviewed the radiological images as listed and agreed with the  findings in the report. No results found.

## 2023-02-12 NOTE — Patient Instructions (Signed)
[place] [facility]  Discharge Instructions: Thank you for choosing [facility] to provide your oncology and hematology care.  If you have a lab appointment with the Cancer Center, please go directly to the Cancer Center and check in at the registration area.  Wear comfortable clothing and clothing appropriate for easy access to any Portacath or PICC line.   We strive to give you quality time with your provider. You may need to reschedule your appointment if you arrive late (15 or more minutes).  Arriving late affects you and other patients whose appointments are after yours.  Also, if you miss three or more appointments without notifying the office, you may be dismissed from the clinic at the provider's discretion.      For prescription refill requests, have your pharmacy contact our office and allow 72 hours for refills to be completed.    Today you received the following chemotherapy and/or immunotherapy agents opdivo (nivolumab), leucovorin, and adrucil      To help prevent nausea and vomiting after your treatment, we encourage you to take your nausea medication as directed.  BELOW ARE SYMPTOMS THAT SHOULD BE REPORTED IMMEDIATELY: *FEVER GREATER THAN 100.4 F (38 C) OR HIGHER *CHILLS OR SWEATING *NAUSEA AND VOMITING THAT IS NOT CONTROLLED WITH YOUR NAUSEA MEDICATION *UNUSUAL SHORTNESS OF BREATH *UNUSUAL BRUISING OR BLEEDING *URINARY PROBLEMS (pain or burning when urinating, or frequent urination) *BOWEL PROBLEMS (unusual diarrhea, constipation, pain near the anus) TENDERNESS IN MOUTH AND THROAT WITH OR WITHOUT PRESENCE OF ULCERS (sore throat, sores in mouth, or a toothache) UNUSUAL RASH, SWELLING OR PAIN  UNUSUAL VAGINAL DISCHARGE OR ITCHING   Items with * indicate a potential emergency and should be followed up as soon as possible or go to the Emergency Department if any problems should occur.  Please show the CHEMOTHERAPY ALERT CARD or  IMMUNOTHERAPY ALERT CARD at check-in to the Emergency Department and triage nurse.  Should you have questions after your visit or need to cancel or reschedule your appointment, please contact [place] [facility]  [phone] and follow the prompts.  Office hours are 8:00 a.m. to 4:30 p.m. Monday - Friday. Please note that voicemails left after 4:00 p.m. may not be returned until the following business day.  We are closed weekends and major holidays. You have access to a nurse at all times for urgent questions. Please call the main number to the clinic [phone] and follow the prompts.  For any non-urgent questions, you may also contact your provider using MyChart. We now offer e-Visits for anyone 69 and older to request care online for non-urgent symptoms. For details visit [email or web address].   Also download the MyChart app! Go to the app store, search "MyChart", open the app, select [place], and log in with your MyChart username and password.

## 2023-02-12 NOTE — Assessment & Plan Note (Signed)
Continue Eliquis 5mg BID 

## 2023-02-12 NOTE — Assessment & Plan Note (Signed)
Stage IV esophageal adenocarcinoma, liver metastatic disease I recommend systematic palliative chemotherapy with FOLFOX [Oxaliplatin 75mg /m2 while on RT] .  Labs are reviewed and discussed with patient. PET showed progression.  Shared decision was made to proceed with 5-FU nivolumab Q2 weeks.   Plan monitoring CEA, short term CT, If no response, plan to switch to 2nd line treatment. I will hold off oxaliplatin as patient had developed recurrent drug rash that needed high dose steroid.

## 2023-02-12 NOTE — Assessment & Plan Note (Signed)
 Chemotherapy plan as listed above 

## 2023-02-12 NOTE — Assessment & Plan Note (Signed)
Sinus congestion with postnasal drip which triggers cough. Recommend Flonase nasal spray as well as adding azelastine nasal spray Recommend trials of Singulair 10 mg daily

## 2023-02-12 NOTE — Progress Notes (Deleted)
Ipilimumab (YERVOY) Patient Monitoring Assessment   Is the patient experiencing any of the following general symptoms?:  [] Difficulty performing normal activities [] Feeling sluggish or cold all the time [] Unusual weight gain [] Constant or unusual headaches [] Feeling dizzy or faint [] Changes in eyesight (blurry vision, double vision, or other vision problems) [] Changes in mood or behavior (ex: decreased sex drive, irritability, or forgetfulness) [] Starting new medications (ex: steroids, other medications that lower immune response) [] Patient is not experiencing any of the general symptoms above.    Gastrointestinal  Patient is having *** bowel movements each day.  Is this different from baseline? [] Yes [] No Are your stools watery or do they have a foul smell? [] Yes [] No Have you seen blood in your stools? [] Yes [] No Are your stools dark, tarry, or sticky? [] Yes [] No Are you having pain or tenderness in your belly? [] Yes [] No  Skin Does your skin itch? [] Yes [] No Do you have a rash? [] Yes [] No Has your skin blistered and/or peeled? [] Yes [] No Do you have sores in your mouth? [] Yes [] No  Hepatic Has your urine been dark or tea colored? [] Yes [] No Have you noticed that your skin or the whites of your eyes are turning yellow? [] Yes [] No Are you bleeding or bruising more easily than normal? [] Yes [] No Are you nauseous and/or vomiting? [] Yes [] No Do you have pain on the right side of your stomach? [] Yes [] No  Neurologic  Are you having unusual weakness of legs, arms, or face? [] Yes [] No Are you having numbness or tingling in your hands or feet? [] Yes [] No  Jonathan [name]

## 2023-02-12 NOTE — Progress Notes (Signed)
Pt in for follow up and treatment today.  Reports having more fatigue from new treatment plan.  Pt also reports having increase of sinus drainage and cough.

## 2023-02-12 NOTE — Patient Instructions (Signed)

## 2023-02-12 NOTE — Assessment & Plan Note (Signed)
Grade 1-2, observation.

## 2023-02-13 ENCOUNTER — Other Ambulatory Visit: Payer: Self-pay | Admitting: Oncology

## 2023-02-14 ENCOUNTER — Inpatient Hospital Stay: Payer: No Typology Code available for payment source

## 2023-02-14 VITALS — BP 144/80 | HR 87 | Temp 98.7°F | Resp 18

## 2023-02-14 DIAGNOSIS — C159 Malignant neoplasm of esophagus, unspecified: Secondary | ICD-10-CM

## 2023-02-14 DIAGNOSIS — Z95828 Presence of other vascular implants and grafts: Secondary | ICD-10-CM

## 2023-02-14 DIAGNOSIS — Z5112 Encounter for antineoplastic immunotherapy: Secondary | ICD-10-CM | POA: Diagnosis not present

## 2023-02-14 LAB — CEA: CEA: 17.4 ng/mL — ABNORMAL HIGH (ref 0.0–4.7)

## 2023-02-14 MED ORDER — HEPARIN SOD (PORK) LOCK FLUSH 100 UNIT/ML IV SOLN
500.0000 [IU] | Freq: Once | INTRAVENOUS | Status: AC
  Administered 2023-02-14: 500 [IU] via INTRAVENOUS
  Filled 2023-02-14: qty 5

## 2023-02-14 MED ORDER — SODIUM CHLORIDE 0.9% FLUSH
10.0000 mL | Freq: Once | INTRAVENOUS | Status: AC
  Administered 2023-02-14: 10 mL via INTRAVENOUS
  Filled 2023-02-14: qty 10

## 2023-02-15 ENCOUNTER — Encounter: Payer: Self-pay | Admitting: Oncology

## 2023-02-17 ENCOUNTER — Telehealth: Payer: Self-pay | Admitting: *Deleted

## 2023-02-17 ENCOUNTER — Other Ambulatory Visit: Payer: Self-pay

## 2023-02-17 DIAGNOSIS — C159 Malignant neoplasm of esophagus, unspecified: Secondary | ICD-10-CM

## 2023-02-17 MED ORDER — PROCHLORPERAZINE MALEATE 10 MG PO TABS: 10.0000 mg | ORAL_TABLET | Freq: Four times a day (QID) | ORAL | Status: DC | PRN

## 2023-02-17 MED ORDER — ONDANSETRON HCL 8 MG PO TABS: 8.0000 mg | ORAL_TABLET | Freq: Three times a day (TID) | ORAL | Status: DC | PRN

## 2023-02-17 NOTE — Telephone Encounter (Signed)
Called pt for clarification. He states that he is having drainage in the back of his throat and that is what is making him nauseous. He states he has been doing his nasal spray as prescribed by MD. He reports feeling better and states he has compazine and zofran at home, which he only has taken a few times, but will try again to see if it helps. Dr. Cathie Hoops recommends [facility] if symptoms don't improve. Pt will call back tomorrow if he is still not feeling well.

## 2023-02-17 NOTE — Telephone Encounter (Signed)
Patient called reporting that he is having nausea ever since his treatment last week. I do not see that he has any antiemetics ordered. Please advise

## 2023-02-19 ENCOUNTER — Other Ambulatory Visit: Payer: Self-pay | Admitting: Medical Oncology

## 2023-02-19 ENCOUNTER — Inpatient Hospital Stay (HOSPITAL_BASED_OUTPATIENT_CLINIC_OR_DEPARTMENT_OTHER): Payer: No Typology Code available for payment source | Admitting: Medical Oncology

## 2023-02-19 ENCOUNTER — Ambulatory Visit
Admission: RE | Admit: 2023-02-19 | Discharge: 2023-02-19 | Disposition: A | Discharge status: Home / Self Care | Payer: No Typology Code available for payment source | Source: Ambulatory Visit | Attending: Medical Oncology | Admitting: *Deleted

## 2023-02-19 ENCOUNTER — Telehealth: Payer: Self-pay | Admitting: *Deleted

## 2023-02-19 ENCOUNTER — Other Ambulatory Visit: Payer: No Typology Code available for payment source

## 2023-02-19 ENCOUNTER — Other Ambulatory Visit: Payer: Self-pay

## 2023-02-19 ENCOUNTER — Encounter: Payer: Self-pay | Admitting: Medical Oncology

## 2023-02-19 ENCOUNTER — Ambulatory Visit
Admission: RE | Admit: 2023-02-19 | Discharge: 2023-02-19 | Disposition: A | Discharge status: Home / Self Care | Payer: No Typology Code available for payment source | Source: Ambulatory Visit | Attending: Medical Oncology | Admitting: Medical Oncology

## 2023-02-19 ENCOUNTER — Inpatient Hospital Stay: Payer: No Typology Code available for payment source

## 2023-02-19 VITALS — BP 127/83 | HR 88 | Temp 98.8°F | Resp 20 | Ht 69.0 in | Wt 221.5 lb

## 2023-02-19 DIAGNOSIS — R0981 Nasal congestion: Secondary | ICD-10-CM

## 2023-02-19 DIAGNOSIS — Z9289 Personal history of other medical treatment: Secondary | ICD-10-CM

## 2023-02-19 DIAGNOSIS — C159 Malignant neoplasm of esophagus, unspecified: Secondary | ICD-10-CM

## 2023-02-19 DIAGNOSIS — R918 Other nonspecific abnormal finding of lung field: Secondary | ICD-10-CM | POA: Diagnosis not present

## 2023-02-19 DIAGNOSIS — Z5112 Encounter for antineoplastic immunotherapy: Secondary | ICD-10-CM | POA: Diagnosis not present

## 2023-02-19 DIAGNOSIS — R051 Acute cough: Secondary | ICD-10-CM

## 2023-02-19 DIAGNOSIS — R059 Cough, unspecified: Secondary | ICD-10-CM | POA: Diagnosis not present

## 2023-02-19 DIAGNOSIS — J3489 Other specified disorders of nose and nasal sinuses: Secondary | ICD-10-CM | POA: Diagnosis not present

## 2023-02-19 LAB — CMP (CANCER CENTER ONLY)
ALT: 42 U/L (ref 0–44)
AST: 44 U/L — ABNORMAL HIGH (ref 15–41)
Albumin: 3.4 g/dL — ABNORMAL LOW (ref 3.5–5.0)
Alkaline Phosphatase: 69 U/L (ref 38–126)
Anion gap: 9 (ref 5–15)
BUN: 15 mg/dL (ref 8–23)
CO2: 26 mmol/L (ref 22–32)
Calcium: 8.9 mg/dL (ref 8.9–10.3)
Chloride: 100 mmol/L (ref 98–111)
Creatinine: 1.05 mg/dL (ref 0.61–1.24)
GFR, Estimated: >60 mL/min (ref >60)
Glucose, Bld: 133 mg/dL — ABNORMAL HIGH (ref 70–99)
Potassium: 3.1 mmol/L — ABNORMAL LOW (ref 3.5–5.1)
Sodium: 135 mmol/L (ref 135–145)
Total Bilirubin: 0.6 mg/dL (ref 0.3–1.2)
Total Protein: 7.9 g/dL (ref 6.5–8.1)

## 2023-02-19 LAB — CBC WITH DIFFERENTIAL (CANCER CENTER ONLY)
Abs Immature Granulocytes: 0.04 10*3/uL (ref 0.00–0.07)
Basophils Absolute: 0.1 10*3/uL (ref 0.0–0.1)
Basophils Relative: 1 %
Eosinophils Absolute: 0.2 10*3/uL (ref 0.0–0.5)
Eosinophils Relative: 4 %
HCT: 34.7 % — ABNORMAL LOW (ref 39.0–52.0)
Hemoglobin: 11.3 g/dL — ABNORMAL LOW (ref 13.0–17.0)
Immature Granulocytes: 1 %
Lymphocytes Relative: 12 %
Lymphs Abs: 0.8 10*3/uL (ref 0.7–4.0)
MCH: 28.4 pg (ref 26.0–34.0)
MCHC: 32.6 g/dL (ref 30.0–36.0)
MCV: 87.2 fL (ref 80.0–100.0)
Monocytes Absolute: 0.6 10*3/uL (ref 0.1–1.0)
Monocytes Relative: 10 %
Neutro Abs: 4.6 10*3/uL (ref 1.7–7.7)
Neutrophils Relative %: 72 %
Platelet Count: 234 10*3/uL (ref 150–400)
RBC: 3.98 MIL/uL — ABNORMAL LOW (ref 4.22–5.81)
RDW: 16.2 % — ABNORMAL HIGH (ref 11.5–15.5)
WBC Count: 6.3 10*3/uL (ref 4.0–10.5)
nRBC: 0 % (ref 0.0–0.2)

## 2023-02-19 LAB — MAGNESIUM: Magnesium: 2.1 mg/dL (ref 1.7–2.4)

## 2023-02-19 MED ORDER — HEPARIN SOD (PORK) LOCK FLUSH 100 UNIT/ML IV SOLN
500.0000 [IU] | Freq: Once | INTRAVENOUS | Status: AC
  Administered 2023-02-19: 500 [IU] via INTRAVENOUS
  Filled 2023-02-19: qty 5

## 2023-02-19 MED ORDER — AZITHROMYCIN 250 MG PO TABS: ORAL_TABLET | ORAL | 0 refills | Status: DC

## 2023-02-19 MED ORDER — DOXYCYCLINE HYCLATE 100 MG PO TABS: 100.0000 mg | ORAL_TABLET | Freq: Two times a day (BID) | ORAL | 0 refills | Status: AC

## 2023-02-19 MED ORDER — CETIRIZINE HCL 10 MG PO TABS: 10.0000 mg | ORAL_TABLET | Freq: Every day | ORAL | 11 refills | Status: DC

## 2023-02-19 MED ORDER — SODIUM CHLORIDE 0.9% FLUSH
10.0000 mL | Freq: Once | INTRAVENOUS | Status: AC
  Administered 2023-02-19: 10 mL via INTRAVENOUS
  Filled 2023-02-19: qty 10

## 2023-02-19 MED ORDER — BENZONATATE 200 MG PO CAPS: 200.0000 mg | ORAL_CAPSULE | Freq: Three times a day (TID) | ORAL | 5 refills | Status: DC | PRN

## 2023-02-19 NOTE — Telephone Encounter (Signed)
Per documentation on 7/22-pt was using nasal spray otc and using compazine/zofran as needed. On 7/22-Dr. Cathie Hoops recommended [facility] if symptoms didn't improve.   At 1115- RN returned phone Call to patient. He is not eating well. Pt is very fatigued. He reports clear sinus drainage, which contributes to his nausea.  The sinus drainage has been going on for several weeks prior  Patient reports intermittent cough (but this is not new). Denies any fevers 97 - 99  range. He reports a BM after each meal but states that this not diarrhea. He was around his cousin on Sunday and found out that he had covid. Cousin was dx with Covid on Tuesday, but at the time of the exposure his cousin was not having any fevers or having any symptoms at that time.  Patient does have a home covid kit that he can test before arrival to cancer center (if it's not expired). Pt thanked me for calling. He will be here at 1pm.

## 2023-02-19 NOTE — Progress Notes (Signed)
Symptom Management Clinic [facility] at [facility] Telephone:(336) [phone] Fax:(336) [phone]  Patient Care Team: [name], MD as PCP - General (Family Medicine) [name], RN as Oncology Nurse Navigator   Name of the patient: Jonathan [name]  [id number]  Nov 07, 1955   Oncological History:  Stage IV Esophageal adenocarcinoma   Current Treatment:  5-FU Nivolumab  Date of visit: 02/19/23  Reason for Consult: IRWIN TORAN is a 67 y.o. male who presents today for:  Drainage: Patient reports that since he started the Nivolumab he has had a lot of sinus drainage with subsequent coughing and nausea. He takes a benadryl at night which resolves symptoms but during the day he struggles with his symptoms. He denies any fever, SOB, rash, acid reflux, worsening symptoms with food.   PAST MEDICAL HISTORY: Past Medical History:  Diagnosis Date   Arthritis    Complication of anesthesia    OCCURRED ONCE YEARS AGO 1981   Esophageal mass    Hypertension    Hypothyroidism    PONV (postoperative nausea and vomiting)     PAST SURGICAL HISTORY:  Past Surgical History:  Procedure Laterality Date   COLONOSCOPY     ESOPHAGOGASTRODUODENOSCOPY N/A 08/23/2022   Procedure: ESOPHAGOGASTRODUODENOSCOPY (EGD);  Surgeon: Jaynie Collins, DO;  Location: [facility] ENDOSCOPY;  Service: Gastroenterology;  Laterality: N/A;   EUS N/A 09/12/2022   Procedure: FULL UPPER ENDOSCOPIC ULTRASOUND (EUS) RADIAL;  Surgeon: Bearl Mulberry, MD;  Location: [facility] ENDOSCOPY;  Service: Gastroenterology;  Laterality: N/A;   FINGER SURGERY     PORTA CATH INSERTION N/A 10/17/2022   Procedure: PORTA CATH INSERTION;  Surgeon: Annice Needy, MD;  Location: [facility] INVASIVE CV LAB;  Service: Cardiovascular;  Laterality: N/A;   TENDON REPAIR IN LEFT KNEE      HEMATOLOGY/ONCOLOGY HISTORY:  Oncology History  Adenocarcinoma of esophagus (HCC)  08/28/2022 Initial Diagnosis   Adenocarcinoma  of esophagus   -Patient has noticed worsening of "food stuck/fullness" sensation since November 2023.  Patient had a barium swallow study which commented on marked mucosal irregularity in the distal esophagus with Broaddus base mural filling defect highly suspicious for malignancy.  Patient establish care with gastroenterology. -08/23/2022, EGD showed gastritis and partially obstructing malignant esophageal tumor in the lower third of the esophagus. Esophagus mass biopsy showed adenocarcinoma.  PD-L1 TPS 65%, HER2 negative.  [facility] NGS showed KRASG12V, CDKN2A, ARID1A, TP53, TMB 5.3, MSI stable.  Stomach biopsy showed gastric mucosa with no specific histology abnormality.  No significant intestinal metaplastic, dysplastic, granular atrophy or increased inflammation.     08/28/2022 Imaging   CT chest abdomen pelvis with contrast showed 1. Distal esophageal primary with gastrohepatic ligament nodal metastasis. 2. 2 right-sided pulmonary nodules, the largest of which measures 5 mm and is new since 2015. Pulmonary metastasis not be excluded. 3. Anterior right lower lobe volume loss and minimal soft tissue density, favoring atelectasis or scar. Recommend attention on follow-up. 4. Hepatic steatosis 5. Cholelithiasis 6. Left nephrolithiasis 7. Coronary artery atherosclerosis. Aortic Atherosclerosis   08/28/2022 Cancer Staging   Staging form: Esophagus - Adenocarcinoma, AJCC 8th Edition - Clinical stage from 08/28/2022: Stage IVB (cT3, cN1, cM1) - Signed by Rickard Patience, MD on 10/04/2022 Stage prefix: Initial diagnosis   09/06/2022 Imaging   PET scan showed 1. Esophageal primary with gastrohepatic ligament nodal metastasis,as on CT. 2. Focus of hypermetabolism which is favored to registered to the posterior hepatic dome, in the region of subtle heterogeneity on prior diagnostic CT. Suboptimally  evaluated secondary to underlying steatosis. Recommend further evaluation with pre and post contrast abdominal MRI  to confirm probable metastasis. 3. Incidental findings, including: Left nephrolithiasis.Cholelithiasis. Coronary artery atherosclerosis. Aortic Atherosclerosis    09/14/2022 - 09/14/2022 Chemotherapy   Patient is on Treatment Plan : ESOPHAGUS Carboplatin + Paclitaxel Weekly X 6 Weeks with XRT     09/23/2022 Imaging   MRI abdomen with and without contrast showed 1. Mildly T2 hyperintense segment VII hepatic lesion measuring 2.7 cm with imaging characteristics compatible with metastatic disease. 2. Tiny focus of delayed enhancement in the inferior right lobe of the liver segment VI measuring 8 mm with ill-defined increased T2 signal and subtle corresponding reduced diffusivity, also suspicious for metastatic disease. 3. Partially visualized distal esophageal wall thickening compatible with the patient's known primary neoplasm. 4. Similar size of the 11 mm gastrohepatic ligament lymph node mildly metabolic on prior PET-CT and compatible with local nodal disease involvement. 5. 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 Recommend follow up pre and post-contrast MRI/MRCP in 1 year. 6. Diffuse hepatic steatosis.   10/10/2022 Procedure   LIVER MASS; CT-GUIDED BIOPSY:  - MODERATE TO POORLY DIFFERENTIATED ADENOCARCINOMA MORPHOLOGICALLY  CONSISTENT WITH METASTASIS FROM PATIENT'S KNOWN ESOPHAGEAL  ADENOCARCINOMA.    10/17/2022 Procedure   Medi port placed by Dr. Wyn Quaker   10/21/2022 - 01/15/2023 Chemotherapy   Patient is on Treatment Plan : ESOPHAGEAL ADENOCARCINOMA FOLFOX q14d x 6 cycles     01/15/2023 Imaging   PET showed  1. Interval progression in metastatic esophageal carcinoma as evidenced by hypermetabolic lymph nodes in the neck, chest, abdomen and pelvis, an enlarging right hepatic lobe metastasis, new bilateral adrenal metastases and new osseous metastases. 2. Cholelithiasis. 3. Left renal stones. 4. Aortic  atherosclerosis (ICD10-I70.0). Coronary artery calcification.    01/27/2023 -  Chemotherapy   Patient is on Treatment Plan : GASTROESOPHAGEAL FOLFOX + Nivolumab q14d       ALLERGIES:  has No Known Allergies.  MEDICATIONS:  Current Outpatient Medications  Medication Sig Dispense Refill   benzonatate (TESSALON) 200 MG capsule Take 1 capsule (200 mg total) by mouth 3 (three) times daily as needed for cough. 20 capsule 5   cetirizine (ZYRTEC ALLERGY) 10 MG tablet Take 1 tablet (10 mg total) by mouth at bedtime. 30 tablet 11   acetaminophen (TYLENOL) 650 MG CR tablet Take 1,300 mg by mouth every 8 (eight) hours as needed for pain. (Patient not taking: Reported on 02/19/2023)     amLODipine (NORVASC) 10 MG tablet Take 10 mg by mouth daily.     atenolol (TENORMIN) 50 MG tablet Take 50 mg by mouth daily.     azelastine (ASTELIN) 0.1 % nasal spray Place 1 spray into both nostrils 2 (two) times daily. Use in each nostril as directed 30 mL 1   chlorhexidine (PERIDEX) 0.12 % solution Use as directed 15 mLs in the mouth or throat 2 (two) times daily. 473 mL 1   Cholecalciferol (VITAMIN D3) 125 MCG (5000 UT) CAPS Take 5,000 Units by mouth daily.     ELIQUIS 5 MG TABS tablet Take 1 tablet by mouth twice daily 60 tablet 0   hydrochlorothiazide (HYDRODIURIL) 25 MG tablet Take 25 mg by mouth daily.     hydrocortisone-nystatin-lidocaine in diphenhydrAMINE liquid Swish and swallow 5 mLs by mouth 3 (three) times daily as needed for mouth pain. 200 mL 2   levothyroxine (SYNTHROID) 125 MCG tablet Take  125 mcg by mouth every morning.     montelukast (SINGULAIR) 10 MG tablet Take 1 tablet (10 mg total) by mouth at bedtime. (Patient not taking: Reported on 02/19/2023) 20 tablet 0   Multiple Vitamins-Minerals (MULTIVITAMIN WITH MINERALS) tablet Take 1 tablet by mouth daily. (Patient not taking: Reported on 02/19/2023)     omeprazole (PRILOSEC) 20 MG capsule Take 20 mg by mouth daily.     ondansetron (ZOFRAN) 8 MG  tablet Take 1 tablet (8 mg total) by mouth every 8 (eight) hours as needed for nausea or vomiting. Start on the third day after chemotherapy.     potassium chloride SA (KLOR-CON M) 20 MEQ tablet Take 2 tablets (40 mEq total) by mouth 2 (two) times daily. 120 tablet 0   prochlorperazine (COMPAZINE) 10 MG tablet Take 1 tablet (10 mg total) by mouth every 6 (six) hours as needed for nausea or vomiting.     No current facility-administered medications for this visit.    VITAL SIGNS: There were no vitals taken for this visit. There were no vitals filed for this visit.  Estimated body mass index is 32.71 kg/m as calculated from the following:   Height as of an earlier encounter on 02/19/23: 5\' 9"  (1.753 m).   Weight as of an earlier encounter on 02/19/23: 221 lb 8 oz (100.5 kg).  LABS: CBC:    Component Value Date/Time   WBC 6.3 02/19/2023 1254   WBC 2.5 (L) 12/18/2022 1432   HGB 11.3 (L) 02/19/2023 1254   HCT 34.7 (L) 02/19/2023 1254   PLT 234 02/19/2023 1254   MCV 87.2 02/19/2023 1254   NEUTROABS 4.6 02/19/2023 1254   LYMPHSABS 0.8 02/19/2023 1254   MONOABS 0.6 02/19/2023 1254   EOSABS 0.2 02/19/2023 1254   BASOSABS 0.1 02/19/2023 1254   Comprehensive Metabolic Panel:    Component Value Date/Time   NA 135 02/19/2023 1254   K 3.1 (L) 02/19/2023 1254   CL 100 02/19/2023 1254   CO2 26 02/19/2023 1254   BUN 15 02/19/2023 1254   CREATININE 1.05 02/19/2023 1254   GLUCOSE 133 (H) 02/19/2023 1254   CALCIUM 8.9 02/19/2023 1254   AST 44 (H) 02/19/2023 1254   ALT 42 02/19/2023 1254   ALKPHOS 69 02/19/2023 1254   BILITOT 0.6 02/19/2023 1254   PROT 7.9 02/19/2023 1254   ALBUMIN 3.4 (L) 02/19/2023 1254    RADIOGRAPHIC STUDIES: No results found.  PERFORMANCE STATUS (ECOG) : 2 - Symptomatic, <50% confined to bed  Review of Systems Unless otherwise noted, a complete review of systems is negative.  Physical Exam General: NAD HEENT: Temporal membranes intact without erythema or  significant effusion bilaterally. Pt is congested with clear sinus drainage without maxillary or frontal effusion. Thin clear post nasal drainage visualized in posterior oropharynx. No erythema of oropharynx.  Cardiovascular: regular rate and rhythm Pulmonary: clear ant fields Extremities: no edema, no joint deformities Skin: no rashes Neurological: Weakness but otherwise nonfocal  Assessment and Plan- Patient is a 67 y.o. male    Encounter Diagnoses  Name Primary?   Adenocarcinoma of esophagus (HCC) Yes   History of immunotherapy    Sinus congestion    Sinus drainage    Acute cough     Acute on chronic. X ray pending. CBC reassuring against infection. Given that the benadryl is effective I suspect symptoms are secondary to his Nivolumab. Goal is to keep him on this treatment if possible. For now I have suggested switching from nightly benadryl to  nightly zyrtec. Will also call in tessalon and have him continue his nasal sprays. He can use his antiemetics as needed. Could also consider Mucinex DM 12 hour which we discussed. I do not suspect his cough is secondary to GERD however pepcid could be considered if the above is ineffective. Should symptoms worsen I would suggest holding his Nivolumab and prescribing oral steroids.    Patient expressed understanding and was in agreement with this plan. He also understands that He can call clinic at any time with any questions, concerns, or complaints.   Thank you for allowing me to participate in the care of this very pleasant patient.   Time Total: 25  Visit consisted of counseling and education dealing with the complex and emotionally intense issues of symptom management in the setting of serious illness.Greater than 50%  of this time was spent counseling and coordinating care related to the above assessment and plan.  Signed by: Clent Jacks, PA-C

## 2023-02-19 NOTE — Telephone Encounter (Addendum)
Patient called reporting that he still is not feeling well s/p infusion. He reports that he is still having nausea and sinus drainage.I tried to call back to see if he has feer, but it went to voice mail. He is asking for Symptom Management Clinic appointment to be evaluated. Please advise

## 2023-02-19 NOTE — Progress Notes (Signed)
.   Symptom Management Clinic [facility] at [facility] Telephone:(336) [phone] Fax:(336) [phone]   Patient Care Team: [name], MD as PCP - General (Family Medicine) [name], RN as Oncology Nurse Navigator    Name of the patient: Jonathan [name]  [id number]  04-18-1956    Oncological History:  Stage IV Esophageal adenocarcinoma    Current Treatment:  5-FU Nivolumab   Date of visit: 02/19/23   Reason for Consult: GREYSON [name] is a 67 y.o. male who presents today for:   Drainage: Patient reports that since he started the Nivolumab he has had a lot of sinus drainage with subsequent coughing and nausea. He takes a benadryl at night which resolves symptoms but during the day he struggles with his symptoms. He denies any fever, SOB, rash, acid reflux, worsening symptoms with food.    PAST MEDICAL HISTORY:     Past Medical History:  Diagnosis Date   Arthritis     Complication of anesthesia      OCCURRED ONCE YEARS AGO 1981   Esophageal mass     Hypertension     Hypothyroidism     PONV (postoperative nausea and vomiting)            PAST SURGICAL HISTORY:       Past Surgical History:  Procedure Laterality Date   COLONOSCOPY       ESOPHAGOGASTRODUODENOSCOPY N/A 08/23/2022    Procedure: ESOPHAGOGASTRODUODENOSCOPY (EGD);  Surgeon: Jaynie Collins, DO;  Location: [facility] ENDOSCOPY;  Service: Gastroenterology;  Laterality: N/A;   EUS N/A 09/12/2022    Procedure: FULL UPPER ENDOSCOPIC ULTRASOUND (EUS) RADIAL;  Surgeon: Bearl Mulberry, MD;  Location: [facility] ENDOSCOPY;  Service: Gastroenterology;  Laterality: N/A;   FINGER SURGERY       PORTA CATH INSERTION N/A 10/17/2022    Procedure: PORTA CATH INSERTION;  Surgeon: Annice Needy, MD;  Location: [facility] INVASIVE CV LAB;  Service: Cardiovascular;  Laterality: N/A;   TENDON REPAIR IN LEFT KNEE              HEMATOLOGY/ONCOLOGY HISTORY:     Oncology History  Adenocarcinoma of  esophagus (HCC)  08/28/2022 Initial Diagnosis    Adenocarcinoma of esophagus    -Patient has noticed worsening of "food stuck/fullness" sensation since November 2023.  Patient had a barium swallow study which commented on marked mucosal irregularity in the distal esophagus with Broaddus base mural filling defect highly suspicious for malignancy.  Patient establish care with gastroenterology. -08/23/2022, EGD showed gastritis and partially obstructing malignant esophageal tumor in the lower third of the esophagus. Esophagus mass biopsy showed adenocarcinoma.  PD-L1 TPS 65%, HER2 negative.  [facility] NGS showed KRASG12V, CDKN2A, ARID1A, TP53, TMB 5.3, MSI stable.  Stomach biopsy showed gastric mucosa with no specific histology abnormality.  No significant intestinal metaplastic, dysplastic, granular atrophy or increased inflammation.        08/28/2022 Imaging    CT chest abdomen pelvis with contrast showed 1. Distal esophageal primary with gastrohepatic ligament nodal metastasis. 2. 2 right-sided pulmonary nodules, the largest of which measures 5 mm and is new since 2015. Pulmonary metastasis not be excluded. 3. Anterior right lower lobe volume loss and minimal soft tissue density, favoring atelectasis or scar. Recommend attention on follow-up. 4. Hepatic steatosis 5. Cholelithiasis 6. Left nephrolithiasis 7. Coronary artery atherosclerosis. Aortic Atherosclerosis    08/28/2022 Cancer Staging    Staging form: Esophagus - Adenocarcinoma, AJCC 8th Edition - Clinical stage from 08/28/2022: Stage IVB (cT3,  cN1, cM1) - Signed by Rickard Patience, MD on 10/04/2022 Stage prefix: Initial diagnosis    09/06/2022 Imaging    PET scan showed 1. Esophageal primary with gastrohepatic ligament nodal metastasis,as on CT. 2. Focus of hypermetabolism which is favored to registered to the posterior hepatic dome, in the region of subtle heterogeneity on prior diagnostic CT. Suboptimally evaluated secondary to underlying  steatosis. Recommend further evaluation with pre and post contrast abdominal MRI to confirm probable metastasis. 3. Incidental findings, including: Left nephrolithiasis.Cholelithiasis. Coronary artery atherosclerosis. Aortic Atherosclerosis     09/14/2022 - 09/14/2022 Chemotherapy    Patient is on Treatment Plan : ESOPHAGUS Carboplatin + Paclitaxel Weekly X 6 Weeks with XRT     09/23/2022 Imaging    MRI abdomen with and without contrast showed 1. Mildly T2 hyperintense segment VII hepatic lesion measuring 2.7 cm with imaging characteristics compatible with metastatic disease. 2. Tiny focus of delayed enhancement in the inferior right lobe of the liver segment VI measuring 8 mm with ill-defined increased T2 signal and subtle corresponding reduced diffusivity, also suspicious for metastatic disease. 3. Partially visualized distal esophageal wall thickening compatible with the patient's known primary neoplasm. 4. Similar size of the 11 mm gastrohepatic ligament lymph node mildly metabolic on prior PET-CT and compatible with local nodal disease involvement. 5. 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 Recommend follow up pre and post-contrast MRI/MRCP in 1 year. 6. Diffuse hepatic steatosis.    10/10/2022 Procedure    LIVER MASS; CT-GUIDED BIOPSY:  - MODERATE TO POORLY DIFFERENTIATED ADENOCARCINOMA MORPHOLOGICALLY  CONSISTENT WITH METASTASIS FROM PATIENT'S KNOWN ESOPHAGEAL  ADENOCARCINOMA.     10/17/2022 Procedure    Medi port placed by Dr. Wyn Quaker    10/21/2022 - 01/15/2023 Chemotherapy    Patient is on Treatment Plan : ESOPHAGEAL ADENOCARCINOMA FOLFOX q14d x 6 cycles     01/15/2023 Imaging    PET showed   1. Interval progression in metastatic esophageal carcinoma as evidenced by hypermetabolic lymph nodes in the neck, chest, abdomen and pelvis, an enlarging right hepatic lobe metastasis, new bilateral adrenal  metastases and new osseous metastases. 2. Cholelithiasis. 3. Left renal stones. 4. Aortic atherosclerosis (ICD10-I70.0). Coronary artery calcification.      01/27/2023 -  Chemotherapy    Patient is on Treatment Plan : GASTROESOPHAGEAL FOLFOX + Nivolumab q14d         ALLERGIES:  has No Known Allergies.   MEDICATIONS:        Current Outpatient Medications  Medication Sig Dispense Refill   benzonatate (TESSALON) 200 MG capsule Take 1 capsule (200 mg total) by mouth 3 (three) times daily as needed for cough. 20 capsule 5   cetirizine (ZYRTEC ALLERGY) 10 MG tablet Take 1 tablet (10 mg total) by mouth at bedtime. 30 tablet 11   acetaminophen (TYLENOL) 650 MG CR tablet Take 1,300 mg by mouth every 8 (eight) hours as needed for pain. (Patient not taking: Reported on 02/19/2023)       amLODipine (NORVASC) 10 MG tablet Take 10 mg by mouth daily.       atenolol (TENORMIN) 50 MG tablet Take 50 mg by mouth daily.       azelastine (ASTELIN) 0.1 % nasal spray Place 1 spray into both nostrils 2 (two) times daily. Use in each nostril as directed 30 mL 1   chlorhexidine (PERIDEX) 0.12 % solution Use as directed 15 mLs in the mouth or throat 2 (  two) times daily. 473 mL 1   Cholecalciferol (VITAMIN D3) 125 MCG (5000 UT) CAPS Take 5,000 Units by mouth daily.       ELIQUIS 5 MG TABS tablet Take 1 tablet by mouth twice daily 60 tablet 0   hydrochlorothiazide (HYDRODIURIL) 25 MG tablet Take 25 mg by mouth daily.       hydrocortisone-nystatin-lidocaine in diphenhydrAMINE liquid Swish and swallow 5 mLs by mouth 3 (three) times daily as needed for mouth pain. 200 mL 2   levothyroxine (SYNTHROID) 125 MCG tablet Take 125 mcg by mouth every morning.       montelukast (SINGULAIR) 10 MG tablet Take 1 tablet (10 mg total) by mouth at bedtime. (Patient not taking: Reported on 02/19/2023) 20 tablet 0   Multiple Vitamins-Minerals (MULTIVITAMIN WITH MINERALS) tablet Take 1 tablet by mouth daily. (Patient not taking: Reported  on 02/19/2023)       omeprazole (PRILOSEC) 20 MG capsule Take 20 mg by mouth daily.       ondansetron (ZOFRAN) 8 MG tablet Take 1 tablet (8 mg total) by mouth every 8 (eight) hours as needed for nausea or vomiting. Start on the third day after chemotherapy.       potassium chloride SA (KLOR-CON M) 20 MEQ tablet Take 2 tablets (40 mEq total) by mouth 2 (two) times daily. 120 tablet 0   prochlorperazine (COMPAZINE) 10 MG tablet Take 1 tablet (10 mg total) by mouth every 6 (six) hours as needed for nausea or vomiting.          No current facility-administered medications for this visit.        VITAL SIGNS: There were no vitals taken for this visit. There were no vitals filed for this visit.  Estimated body mass index is 32.71 kg/m as calculated from the following:   Height as of an earlier encounter on 02/19/23: 5\' 9"  (1.753 m).   Weight as of an earlier encounter on 02/19/23: 221 lb 8 oz (100.5 kg).   LABS: CBC: Labs (Brief)          Component Value Date/Time    WBC 6.3 02/19/2023 1254    WBC 2.5 (L) 12/18/2022 1432    HGB 11.3 (L) 02/19/2023 1254    HCT 34.7 (L) 02/19/2023 1254    PLT 234 02/19/2023 1254    MCV 87.2 02/19/2023 1254    NEUTROABS 4.6 02/19/2023 1254    LYMPHSABS 0.8 02/19/2023 1254    MONOABS 0.6 02/19/2023 1254    EOSABS 0.2 02/19/2023 1254    BASOSABS 0.1 02/19/2023 1254      Comprehensive Metabolic Panel: Labs (Brief)          Component Value Date/Time    NA 135 02/19/2023 1254    K 3.1 (L) 02/19/2023 1254    CL 100 02/19/2023 1254    CO2 26 02/19/2023 1254    BUN 15 02/19/2023 1254    CREATININE 1.05 02/19/2023 1254    GLUCOSE 133 (H) 02/19/2023 1254    CALCIUM 8.9 02/19/2023 1254    AST 44 (H) 02/19/2023 1254    ALT 42 02/19/2023 1254    ALKPHOS 69 02/19/2023 1254    BILITOT 0.6 02/19/2023 1254    PROT 7.9 02/19/2023 1254    ALBUMIN 3.4 (L) 02/19/2023 1254        RADIOGRAPHIC STUDIES: Imaging Results  No results found.     PERFORMANCE  STATUS (ECOG) : 2 - Symptomatic, <50% confined to bed   Review of Systems Unless  otherwise noted, a complete review of systems is negative.   Physical Exam General: NAD HEENT: Temporal membranes intact without erythema or significant effusion bilaterally. Pt is congested with clear sinus drainage without maxillary or frontal effusion. Thin clear post nasal drainage visualized in posterior oropharynx. No erythema of oropharynx.  Cardiovascular: regular rate and rhythm Pulmonary: clear ant fields Extremities: no edema, no joint deformities Skin: no rashes Neurological: Weakness but otherwise nonfocal   Assessment and Plan- Patient is a 67 y.o. male        Encounter Diagnoses  Name Primary?   Adenocarcinoma of esophagus (HCC) Yes   History of immunotherapy     Sinus congestion     Sinus drainage     Acute cough        Acute on chronic. X ray pending. CBC reassuring against infection. Given that the benadryl is effective I suspect symptoms are secondary to his Nivolumab. Goal is to keep him on this treatment if possible. For now I have suggested switching from nightly benadryl to nightly zyrtec. Will also call in tessalon and have him continue his nasal sprays. He can use his antiemetics as needed. Could also consider Mucinex DM 12 hour which we discussed. I do not suspect his cough is secondary to GERD however pepcid could be considered if the above is ineffective. Should symptoms worsen I would suggest holding his Nivolumab and prescribing oral steroids.      Patient expressed understanding and was in agreement with this plan. He also understands that He can call clinic at any time with any questions, concerns, or complaints.    Thank you for allowing me to participate in the care of this very pleasant patient.    Time Total: 25   Visit consisted of counseling and education dealing with the complex and emotionally intense issues of symptom management in the setting of serious  illness.Greater than 50%  of this time was spent counseling and coordinating care related to the above assessment and plan.   Signed by: Clent Jacks, PA-C

## 2023-02-20 DIAGNOSIS — C159 Malignant neoplasm of esophagus, unspecified: Secondary | ICD-10-CM | POA: Diagnosis not present

## 2023-02-24 ENCOUNTER — Encounter: Payer: Self-pay | Admitting: Oncology

## 2023-02-26 ENCOUNTER — Encounter: Payer: Self-pay | Admitting: Oncology

## 2023-02-26 ENCOUNTER — Ambulatory Visit: Payer: No Typology Code available for payment source

## 2023-02-26 ENCOUNTER — Inpatient Hospital Stay: Payer: No Typology Code available for payment source

## 2023-02-26 ENCOUNTER — Inpatient Hospital Stay (HOSPITAL_BASED_OUTPATIENT_CLINIC_OR_DEPARTMENT_OTHER): Payer: No Typology Code available for payment source | Admitting: Oncology

## 2023-02-26 VITALS — BP 109/71 | HR 83 | Temp 96.4°F | Resp 18 | Wt 223.1 lb

## 2023-02-26 DIAGNOSIS — C159 Malignant neoplasm of esophagus, unspecified: Secondary | ICD-10-CM

## 2023-02-26 DIAGNOSIS — G620 Drug-induced polyneuropathy: Secondary | ICD-10-CM | POA: Diagnosis not present

## 2023-02-26 DIAGNOSIS — I82621 Acute embolism and thrombosis of deep veins of right upper extremity: Secondary | ICD-10-CM | POA: Diagnosis not present

## 2023-02-26 DIAGNOSIS — T451X5A Adverse effect of antineoplastic and immunosuppressive drugs, initial encounter: Secondary | ICD-10-CM

## 2023-02-26 DIAGNOSIS — E876 Hypokalemia: Secondary | ICD-10-CM | POA: Diagnosis not present

## 2023-02-26 DIAGNOSIS — Z5112 Encounter for antineoplastic immunotherapy: Secondary | ICD-10-CM | POA: Diagnosis not present

## 2023-02-26 DIAGNOSIS — R0982 Postnasal drip: Secondary | ICD-10-CM | POA: Diagnosis not present

## 2023-02-26 DIAGNOSIS — Z5111 Encounter for antineoplastic chemotherapy: Secondary | ICD-10-CM

## 2023-02-26 LAB — CBC WITH DIFFERENTIAL (CANCER CENTER ONLY)
Abs Immature Granulocytes: 0.01 10*3/uL (ref 0.00–0.07)
Basophils Absolute: 0.1 10*3/uL (ref 0.0–0.1)
Basophils Relative: 1 %
Eosinophils Absolute: 0.1 10*3/uL (ref 0.0–0.5)
Eosinophils Relative: 3 %
HCT: 32.5 % — ABNORMAL LOW (ref 39.0–52.0)
Hemoglobin: 10.4 g/dL — ABNORMAL LOW (ref 13.0–17.0)
Immature Granulocytes: 0 %
Lymphocytes Relative: 14 %
Lymphs Abs: 0.7 10*3/uL (ref 0.7–4.0)
MCH: 28.1 pg (ref 26.0–34.0)
MCHC: 32 g/dL (ref 30.0–36.0)
MCV: 87.8 fL (ref 80.0–100.0)
Monocytes Absolute: 0.8 10*3/uL (ref 0.1–1.0)
Monocytes Relative: 16 %
Neutro Abs: 3.3 10*3/uL (ref 1.7–7.7)
Neutrophils Relative %: 66 %
Platelet Count: 282 10*3/uL (ref 150–400)
RBC: 3.7 MIL/uL — ABNORMAL LOW (ref 4.22–5.81)
RDW: 15.9 % — ABNORMAL HIGH (ref 11.5–15.5)
WBC Count: 5 10*3/uL (ref 4.0–10.5)
nRBC: 0 % (ref 0.0–0.2)

## 2023-02-26 LAB — TSH: TSH: 0.601 u[IU]/mL (ref 0.350–4.500)

## 2023-02-26 LAB — CMP (CANCER CENTER ONLY)
ALT: 23 U/L (ref 0–44)
AST: 27 U/L (ref 15–41)
Albumin: 3.1 g/dL — ABNORMAL LOW (ref 3.5–5.0)
Alkaline Phosphatase: 66 U/L (ref 38–126)
Anion gap: 9 (ref 5–15)
BUN: 12 mg/dL (ref 8–23)
CO2: 23 mmol/L (ref 22–32)
Calcium: 9.2 mg/dL (ref 8.9–10.3)
Chloride: 106 mmol/L (ref 98–111)
Creatinine: 1.02 mg/dL (ref 0.61–1.24)
GFR, Estimated: >60 mL/min (ref >60)
Glucose, Bld: 106 mg/dL — ABNORMAL HIGH (ref 70–99)
Potassium: 3.1 mmol/L — ABNORMAL LOW (ref 3.5–5.1)
Sodium: 138 mmol/L (ref 135–145)
Total Bilirubin: 0.7 mg/dL (ref 0.3–1.2)
Total Protein: 7.1 g/dL (ref 6.5–8.1)

## 2023-02-26 MED ORDER — PALONOSETRON HCL INJECTION 0.25 MG/5ML
0.2500 mg | Freq: Once | INTRAVENOUS | Status: AC
  Administered 2023-02-26: 0.25 mg via INTRAVENOUS
  Filled 2023-02-26: qty 5

## 2023-02-26 MED ORDER — SODIUM CHLORIDE 0.9 % IV SOLN
INTRAVENOUS | Status: DC
  Filled 2023-02-26: qty 250

## 2023-02-26 MED ORDER — FLUOROURACIL CHEMO INJECTION 2.5 GM/50ML
400.0000 mg/m2 | Freq: Once | INTRAVENOUS | Status: AC
  Administered 2023-02-26: 900 mg via INTRAVENOUS
  Filled 2023-02-26: qty 18

## 2023-02-26 MED ORDER — LEUCOVORIN CALCIUM INJECTION 350 MG
400.0000 mg/m2 | Freq: Once | INTRAVENOUS | Status: AC
  Administered 2023-02-26: 896 mg via INTRAVENOUS
  Filled 2023-02-26: qty 44.8

## 2023-02-26 MED ORDER — SODIUM CHLORIDE 0.9 % IV SOLN
2400.0000 mg/m2 | INTRAVENOUS | Status: DC
  Administered 2023-02-26: 5000 mg via INTRAVENOUS
  Filled 2023-02-26: qty 100

## 2023-02-26 MED ORDER — SODIUM CHLORIDE 0.9 % IV SOLN
240.0000 mg | Freq: Once | INTRAVENOUS | Status: AC
  Administered 2023-02-26: 240 mg via INTRAVENOUS
  Filled 2023-02-26: qty 24

## 2023-02-26 MED ORDER — SODIUM CHLORIDE 0.9 % IV SOLN
10.0000 mg | Freq: Once | INTRAVENOUS | Status: AC
  Administered 2023-02-26: 10 mg via INTRAVENOUS
  Filled 2023-02-26: qty 10

## 2023-02-26 NOTE — Progress Notes (Signed)
Nutrition Follow-up:  Patient with esophageal cancer.  Patient on 5FU and nivolumab.    Met with patient during infusion for nutrition follow-up.  Patient reports that his appetite has not been good over the last few weeks.  Has been seen by [facility] for sinus drainage and nausea.   Planning to see ENT as well.  Says that he is drinking equate plus protein shake (350 calories) usually 1 a day.  York Spaniel that he felt good on 7/29 and was able to eat pizza buffet with soup.  Yesterday did not feel well.  Taking nausea medication as well    Medications: reviewed  Labs: reviewed  Anthropometrics:   Weight 223 lb 1.6 oz today 231 lb on 7/1 236 lb 14.4 oz on 5/13 240 lb 1.6 oz on 4/22 244 lb 1.6 oz on 4/9 265 lb on 1/31   NUTRITION DIAGNOSIS: Inadequate oral intake continues    INTERVENTION:  Recommend increased 350 calorie shake to 2-3 times a day for added calories.  Maximize intake on good days. Continue antinausea medication    MONITORING, EVALUATION, GOAL: weight trends, intake   NEXT VISIT: Wednesday, August 14 during infusion  Andriea Hasegawa B. Freida Busman, RD, LDN Registered Dietitian [phone]

## 2023-02-26 NOTE — Assessment & Plan Note (Signed)
Chronic issue for him Cotinue KCL to BID. Marland Kitchen  Previous IV Kcl did not help to correct his level.

## 2023-02-26 NOTE — Progress Notes (Signed)
Hematology/Oncology Progress note [phone] [phone]        REFERRING PROVIDER: Rickard Patience, MD  CHIEF COMPLAINTS/PURPOSE OF CONSULTATION:  Stage IV Esophageal adenocarcinoma  ASSESSMENT & PLAN:   Cancer Staging  Adenocarcinoma of esophagus [facility]) Staging form: Esophagus - Adenocarcinoma, AJCC 8th Edition - Clinical stage from 08/28/2022: Stage IVB (cT3, cN1, cM1) - Signed by Rickard Patience, MD on 10/04/2022   Adenocarcinoma of esophagus (HCC) Stage IV esophageal adenocarcinoma, liver metastatic disease I recommend systematic palliative chemotherapy with FOLFOX [Oxaliplatin 75mg /m2 while on RT] .  Labs are reviewed and discussed with patient. PET showed progression.  proceed with 5-FU nivolumab Q2 weeks.   Plan monitoring CEA, short term CT, If no response, plan to switch to next line treatment. I will hold off oxaliplatin as patient had developed recurrent drug rash that needed high dose steroid.   Hypokalemia Chronic issue for him Cotinue KCL to BID. Marland Kitchen  Previous IV Kcl did not help to correct his level.     Encounter for antineoplastic chemotherapy Chemotherapy plan as listed above.   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   Postnasal drip Post nasal drip, nasal congestion, could be due to exacerbation of underlying allergy/sinus inflammation by immunotherapy Recommend singulair 10mg  at bedtime, Zyrtec or claritin, utilization of flonase nasal sprayer and Asletin nasal sprayer. I will hold of steroid for now unless symptoms are severe and are not controlled by current measures. Recommend patient to re-establish with ENT Dr. Andee Poles.      Orders Placed This Encounter  Procedures   CEA    Standing Status:   Future    Standing Expiration Date:   03/11/2024   CBC with Differential (Cancer Center Only)    Standing Status:   Future    Standing Expiration Date:    03/11/2024   CMP (Cancer Center only)    Standing Status:   Future    Standing Expiration Date:   03/11/2024   Ambulatory referral to ENT    Referral Priority:   Routine    Referral Type:   Consultation    Referral Reason:   Specialty Services Required    Requested Specialty:   Otolaryngology    Number of Visits Requested:   1    Follow-up 2 weeks.   All questions were answered. The patient knows to call the clinic with any problems, questions or concerns.  Rickard Patience, MD, PhD [facility] Hematology Oncology 02/26/2023   HISTORY OF PRESENTING ILLNESS:  Jonathan [name] 67 y.o. male presents to establish care for esophageal adenocarcinoma I have reviewed his chart and materials related to his cancer extensively and collaborated history with the patient. Summary of oncologic history is as follows: Oncology History  Adenocarcinoma of esophagus (HCC)  08/28/2022 Initial Diagnosis   Adenocarcinoma of esophagus   -Patient has noticed worsening of "food stuck/fullness" sensation since November 2023.  Patient had a barium swallow study which commented on marked mucosal irregularity in the distal esophagus with Broaddus base mural filling defect highly suspicious for malignancy.  Patient establish care with gastroenterology. -08/23/2022, EGD showed gastritis and partially obstructing malignant esophageal tumor in the lower third of the esophagus. Esophagus mass biopsy showed adenocarcinoma.  PD-L1 TPS 65%, HER2 negative.  [facility] NGS showed KRASG12V, CDKN2A, ARID1A, TP53, TMB 5.3, MSI stable.  Stomach biopsy showed gastric mucosa with no specific histology abnormality.  No significant intestinal metaplastic, dysplastic, granular atrophy or increased inflammation.  08/28/2022 Imaging   CT chest abdomen pelvis with contrast showed 1. Distal esophageal primary with gastrohepatic ligament nodal metastasis. 2. 2 right-sided pulmonary nodules, the largest of which measures 5 mm and is new since  2015. Pulmonary metastasis not be excluded. 3. Anterior right lower lobe volume loss and minimal soft tissue density, favoring atelectasis or scar. Recommend attention on follow-up. 4. Hepatic steatosis 5. Cholelithiasis 6. Left nephrolithiasis 7. Coronary artery atherosclerosis. Aortic Atherosclerosis   08/28/2022 Cancer Staging   Staging form: Esophagus - Adenocarcinoma, AJCC 8th Edition - Clinical stage from 08/28/2022: Stage IVB (cT3, cN1, cM1) - Signed by Rickard Patience, MD on 10/04/2022 Stage prefix: Initial diagnosis   09/06/2022 Imaging   PET scan showed 1. Esophageal primary with gastrohepatic ligament nodal metastasis,as on CT. 2. Focus of hypermetabolism which is favored to registered to the posterior hepatic dome, in the region of subtle heterogeneity on prior diagnostic CT. Suboptimally evaluated secondary to underlying steatosis. Recommend further evaluation with pre and post contrast abdominal MRI to confirm probable metastasis. 3. Incidental findings, including: Left nephrolithiasis.Cholelithiasis. Coronary artery atherosclerosis. Aortic Atherosclerosis    09/14/2022 - 09/14/2022 Chemotherapy   Patient is on Treatment Plan : ESOPHAGUS Carboplatin + Paclitaxel Weekly X 6 Weeks with XRT     09/23/2022 Imaging   MRI abdomen with and without contrast showed 1. Mildly T2 hyperintense segment VII hepatic lesion measuring 2.7 cm with imaging characteristics compatible with metastatic disease. 2. Tiny focus of delayed enhancement in the inferior right lobe of the liver segment VI measuring 8 mm with ill-defined increased T2 signal and subtle corresponding reduced diffusivity, also suspicious for metastatic disease. 3. Partially visualized distal esophageal wall thickening compatible with the patient's known primary neoplasm. 4. Similar size of the 11 mm gastrohepatic ligament lymph node mildly metabolic on prior PET-CT and compatible with local nodal disease involvement. 5. 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 Recommend follow up pre and post-contrast MRI/MRCP in 1 year. 6. Diffuse hepatic steatosis.   10/10/2022 Procedure   LIVER MASS; CT-GUIDED BIOPSY:  - MODERATE TO POORLY DIFFERENTIATED ADENOCARCINOMA MORPHOLOGICALLY  CONSISTENT WITH METASTASIS FROM PATIENT'S KNOWN ESOPHAGEAL  ADENOCARCINOMA.    10/17/2022 Procedure   Medi port placed by Dr. Wyn Quaker   10/21/2022 - 01/15/2023 Chemotherapy   Patient is on Treatment Plan : ESOPHAGEAL ADENOCARCINOMA FOLFOX q14d x 6 cycles     01/15/2023 Imaging   PET showed  1. Interval progression in metastatic esophageal carcinoma as evidenced by hypermetabolic lymph nodes in the neck, chest, abdomen and pelvis, an enlarging right hepatic lobe metastasis, new bilateral adrenal metastases and new osseous metastases. 2. Cholelithiasis. 3. Left renal stones. 4. Aortic atherosclerosis (ICD10-I70.0). Coronary artery calcification.    01/27/2023 -  Chemotherapy   Patient is on Treatment Plan : GASTROESOPHAGEAL FOLFOX + Nivolumab q14d     Patient presents to establish care.  He is not taking PPI. He has intentionally lost some weight. Family history positive for father and paternal uncle with prostate cancer and sister with breast cancer. Denies any routine alcohol use  Right interval jugular vein occlusive DVT, started on Eliquis starter package on 10/28/2022, swelling of neck has improved.    INTERVAL HISTORY ARIEL DETHLOFF is a 67 y.o. male who has above history reviewed by me today presents for follow up visit for Stage IV esophageal adenocarcinoma.  He feels more fatigued.  Increased sinus drainage and cough.  Postnasal drip  He was seen by [facility] Dr Alejandro Otero Lopez NP and CXR showed Interval development of bilateral perihilar interstitial opacities  He was treated with Azithromycin and Doxycycline to cover atypical infections.  He denies any SOB. He coughs  because of postnasal drip.     MEDICAL HISTORY:  Past Medical History:  Diagnosis Date   Arthritis    Complication of anesthesia    OCCURRED ONCE YEARS AGO 1981   Esophageal mass    Hypertension    Hypothyroidism    PONV (postoperative nausea and vomiting)     SURGICAL HISTORY: Past Surgical History:  Procedure Laterality Date   COLONOSCOPY     ESOPHAGOGASTRODUODENOSCOPY N/A 08/23/2022   Procedure: ESOPHAGOGASTRODUODENOSCOPY (EGD);  Surgeon: Jaynie Collins, DO;  Location: [facility] ENDOSCOPY;  Service: Gastroenterology;  Laterality: N/A;   EUS N/A 09/12/2022   Procedure: FULL UPPER ENDOSCOPIC ULTRASOUND (EUS) RADIAL;  Surgeon: Bearl Mulberry, MD;  Location[facility] ENDOSCOPY;  Service: Gastroenterology;  Laterality: N/A;   FINGER SURGERY     PORTA CATH INSERTION N/A 10/17/2022   Procedure: PORTA CATH INSERTION;  Surgeon: Annice Needy, MD;  Location: [facility] INVASIVE CV LAB;  Service: Cardiovascular;  Laterality: N/A;   TENDON REPAIR IN LEFT KNEE      SOCIAL HISTORY: Social History   Socioeconomic History   Marital status: Single    Spouse name: Not on file   Number of children: Not on file   Years of education: Not on file   Highest education level: Not on file  Occupational History   Not on file  Tobacco Use   Smoking status: Never   Smokeless tobacco: Never  Vaping Use   Vaping status: Never Used  Substance and Sexual Activity   Alcohol use: Yes    Comment: OCCASIONALLY   Drug use: Never   Sexual activity: Not on file  Other Topics Concern   Not on file  Social History Narrative   Not on file   Social Determinants of Health   Financial Resource Strain: Low Risk  (10/29/2022)   Received from [facility], [facility]   Overall Financial Resource Strain (CARDIA)    Difficulty of Paying Living Expenses: Not hard at all  Food Insecurity: No Food Insecurity (10/29/2022)   Received from [facility], [facility]   Hunger Vital Sign    Worried About Running Out of Food in the Last Year: Never true    Ran Out of Food in the Last Year: Never true  Transportation Needs: No Transportation Needs (10/29/2022)   Received from [facility], [facility]   [facility] - Transportation    In the past 12 months, has lack of transportation kept you from medical appointments or from getting medications?: No    Lack of Transportation (Non-Medical): No  Physical Activity: Not on file  Stress: Not on file  Social Connections: Not on file  Intimate Partner Violence: Not At Risk (08/28/2022)   Humiliation, Afraid, Rape, and Kick questionnaire    Fear of Current or Ex-Partner: No    Emotionally Abused: No    Physically Abused: No    Sexually Abused: No    FAMILY HISTORY: Family History  Problem Relation Age of Onset   Heart attack Mother    Prostate cancer Father    Breast cancer Sister    Prostate cancer Paternal Uncle     ALLERGIES:  has No Known Allergies.  MEDICATIONS:  Current Outpatient Medications  Medication Sig Dispense Refill   amLODipine (NORVASC) 10 MG tablet Take 10 mg by mouth daily.     atenolol (TENORMIN) 50 MG tablet Take 50 mg by mouth daily.     azelastine (ASTELIN) 0.1 % nasal spray Place 1 spray into both nostrils 2 (two) times daily. Use in each nostril as directed 30 mL 1   benzonatate (TESSALON) 200 MG capsule Take 1 capsule (200 mg total) by mouth 3 (three) times daily as needed for cough. 20 capsule 5   cetirizine (ZYRTEC ALLERGY) 10 MG tablet Take 1 tablet (10 mg total) by mouth at bedtime. 30 tablet 11   chlorhexidine (PERIDEX) 0.12 % solution Use as directed 15 mLs in the mouth or throat 2 (two) times daily. 473 mL 1   Cholecalciferol (VITAMIN D3) 125 MCG (5000 UT) CAPS Take 5,000 Units by mouth daily.     doxycycline (VIBRA-TABS) 100 MG tablet Take 1 tablet (100 mg total) by mouth 2 (two) times daily for 7 days. 14 tablet  0   ELIQUIS 5 MG TABS tablet Take 1 tablet by mouth twice daily 60 tablet 0   hydrochlorothiazide (HYDRODIURIL) 25 MG tablet Take 25 mg by mouth daily.     hydrocortisone-nystatin-lidocaine in diphenhydrAMINE liquid Swish and swallow 5 mLs by mouth 3 (three) times daily as needed for mouth pain. 200 mL 2   levothyroxine (SYNTHROID) 125 MCG tablet Take 125 mcg by mouth every morning.     montelukast (SINGULAIR) 10 MG tablet Take 1 tablet (10 mg total) by mouth at bedtime. 20 tablet 0   Multiple Vitamins-Minerals (MULTIVITAMIN WITH MINERALS) tablet Take 1 tablet by mouth daily.     omeprazole (PRILOSEC) 20 MG capsule Take 20 mg by mouth daily.     ondansetron (ZOFRAN) 8 MG tablet Take 1 tablet (8 mg total) by mouth every 8 (eight) hours as needed for nausea or vomiting. Start on the third day after chemotherapy.     potassium chloride SA (KLOR-CON M) 20 MEQ tablet Take 2 tablets (40 mEq total) by mouth 2 (two) times daily. 120 tablet 0   prochlorperazine (COMPAZINE) 10 MG tablet Take 1 tablet (10 mg total) by mouth every 6 (six) hours as needed for nausea or vomiting.     acetaminophen (TYLENOL) 650 MG CR tablet Take 1,300 mg by mouth every 8 (eight) hours as needed for pain. (Patient not taking: Reported on 02/19/2023)     No current facility-administered medications for this visit.   Facility-Administered Medications Ordered in Other Visits  Medication Dose Route Frequency Provider Last Rate Last Admin   0.9 %  sodium chloride infusion   Intravenous Continuous Rickard Patience, MD 20 mL/hr at 02/26/23 0927 New Bag at 02/26/23 0927   fluorouracil (ADRUCIL) 5,000 mg in sodium chloride 0.9 % 150 mL chemo infusion  2,400 mg/m2 (Treatment Plan Recorded) Intravenous 1 day or 1 dose Rickard Patience, MD   5,000 mg at 02/26/23 1214    Review of Systems  Constitutional:  Negative for appetite change, chills, fatigue and fever.  HENT:   Negative for hearing loss and voice change.        Nasal congestion, postnasal  drip.   Eyes:  Negative for eye problems and icterus.  Respiratory:  Negative for chest tightness, cough and shortness of breath.   Cardiovascular:  Negative for chest pain and leg swelling.  Gastrointestinal:  Negative for abdominal distention, abdominal pain, nausea and vomiting.  Endocrine: Negative for hot flashes.  Genitourinary:  Negative for  difficulty urinating, dysuria and frequency.   Musculoskeletal:  Negative for arthralgias.  Skin:  Positive for rash. Negative for itching.  Neurological:  Negative for light-headedness and numbness.  Hematological:  Negative for adenopathy. Does not bruise/bleed easily.  Psychiatric/Behavioral:  Negative for confusion.      PHYSICAL EXAMINATION: ECOG PERFORMANCE STATUS: 0 - Asymptomatic  Vitals:   02/26/23 0834  BP: 109/71  Pulse: 83  Resp: 18  Temp: (!) 96.4 F (35.8 C)  SpO2: 98%   Filed Weights   02/26/23 0834  Weight: 223 lb 1.6 oz (101.2 kg)    Physical Exam Constitutional:      General: He is not in acute distress.    Appearance: He is obese. He is not diaphoretic.  HENT:     Head: Normocephalic and atraumatic.  Eyes:     General: No scleral icterus.    Pupils: Pupils are equal, round, and reactive to light.  Cardiovascular:     Rate and Rhythm: Normal rate and regular rhythm.     Heart sounds: No murmur heard. Pulmonary:     Effort: Pulmonary effort is normal. No respiratory distress.     Breath sounds: No rales.  Chest:     Chest wall: No tenderness.  Abdominal:     General: There is no distension.     Palpations: Abdomen is soft.     Tenderness: There is no abdominal tenderness.  Musculoskeletal:        General: Normal range of motion.     Cervical back: Normal range of motion and neck supple.  Skin:    General: Skin is warm and dry.     Findings: No erythema.  Neurological:     Mental Status: He is alert and oriented to person, place, and time. Mental status is at baseline.     Cranial Nerves: No  cranial nerve deficit.     Motor: No abnormal muscle tone.     Coordination: Coordination normal.  Psychiatric:        Mood and Affect: Mood and affect normal.      LABORATORY DATA:  I have reviewed the data as listed    Latest Ref Rng & Units 02/26/2023    7:59 AM 02/19/2023   12:54 PM 02/12/2023    8:02 AM  CBC  WBC 4.0 - 10.5 K/uL 5.0  6.3  5.1   Hemoglobin 13.0 - 17.0 g/dL 95.2  84.1  32.4   Hematocrit 39.0 - 52.0 % 32.5  34.7  34.2   Platelets 150 - 400 K/uL 282  234  258       Latest Ref Rng & Units 02/26/2023    7:59 AM 02/19/2023   12:54 PM 02/12/2023    8:02 AM  CMP  Glucose 70 - 99 mg/dL 401  027  253   BUN 8 - 23 mg/dL 12  15  11    Creatinine 0.61 - 1.24 mg/dL 6.64  4.03  4.74   Sodium 135 - 145 mmol/L 138  135  135   Potassium 3.5 - 5.1 mmol/L 3.1  3.1  3.0   Chloride 98 - 111 mmol/L 106  100  101   CO2 22 - 32 mmol/L 23  26  25    Calcium 8.9 - 10.3 mg/dL 9.2  8.9  8.9   Total Protein 6.5 - 8.1 g/dL 7.1  7.9  7.7   Total Bilirubin 0.3 - 1.2 mg/dL 0.7  0.6  0.6   Alkaline Phos 38 -  126 U/L 66  69  62   AST 15 - 41 U/L 27  44  33   ALT 0 - 44 U/L 23  42  26      RADIOGRAPHIC STUDIES: I have personally reviewed the radiological images as listed and agreed with the findings in the report. DG Chest 2 View  Result Date: 02/19/2023 CLINICAL DATA:  Cough.  Esophageal cancer EXAM: CHEST - 2 VIEW COMPARISON:  01/08/2023 FINDINGS: Right-sided chest port terminates within the distal SVC. Normal heart size. New bilateral perihilar interstitial opacities, right worse than left. No pleural effusion or pneumothorax. IMPRESSION: Interval development of bilateral perihilar interstitial opacities, which may reflect edema or atypical infection. Electronically Signed   By: Duanne Guess D.O.   On: 02/19/2023 15:01

## 2023-02-26 NOTE — Assessment & Plan Note (Signed)
Chemotherapy plan as listed above 

## 2023-02-26 NOTE — Assessment & Plan Note (Signed)
Grade 1-2, observation.

## 2023-02-26 NOTE — Assessment & Plan Note (Signed)
Continue Eliquis 5mg BID 

## 2023-02-26 NOTE — Assessment & Plan Note (Addendum)
Post nasal drip, nasal congestion, could be due to exacerbation of underlying allergy/sinus inflammation by immunotherapy Recommend singulair 10mg  at bedtime, Zyrtec or claritin, utilization of flonase nasal sprayer and Asletin nasal sprayer. I will hold of steroid for now unless symptoms are severe and are not controlled by current measures. Recommend patient to re-establish with ENT Dr. Andee Poles.

## 2023-02-26 NOTE — Assessment & Plan Note (Addendum)
Stage IV esophageal adenocarcinoma, liver metastatic disease I recommend systematic palliative chemotherapy with FOLFOX [Oxaliplatin 75mg /m2 while on RT] .  Labs are reviewed and discussed with patient. PET showed progression.  proceed with 5-FU nivolumab Q2 weeks.   Plan monitoring CEA, short term CT, If no response, plan to switch to next line treatment. I will hold off oxaliplatin as patient had developed recurrent drug rash that needed high dose steroid.

## 2023-02-26 NOTE — Patient Instructions (Signed)
[facility]  Discharge Instructions: Thank you for choosing [facility] to provide your oncology and hematology care.  If you have a lab appointment with the Cancer Center, please go directly to the Cancer Center and check in at the registration area.  Wear comfortable clothing and clothing appropriate for easy access to any Portacath or PICC line.   We strive to give you quality time with your provider. You may need to reschedule your appointment if you arrive late (15 or more minutes).  Arriving late affects you and other patients whose appointments are after yours.  Also, if you miss three or more appointments without notifying the office, you may be dismissed from the clinic at the provider's discretion.      For prescription refill requests, have your pharmacy contact our office and allow 72 hours for refills to be completed.    Today you received the following chemotherapy and/or immunotherapy agents Nivolumab, Leucovorin, 5FU injection and pump.      To help prevent nausea and vomiting after your treatment, we encourage you to take your nausea medication as directed.  BELOW ARE SYMPTOMS THAT SHOULD BE REPORTED IMMEDIATELY: *FEVER GREATER THAN 100.4 F (38 C) OR HIGHER *CHILLS OR SWEATING *NAUSEA AND VOMITING THAT IS NOT CONTROLLED WITH YOUR NAUSEA MEDICATION *UNUSUAL SHORTNESS OF BREATH *UNUSUAL BRUISING OR BLEEDING *URINARY PROBLEMS (pain or burning when urinating, or frequent urination) *BOWEL PROBLEMS (unusual diarrhea, constipation, pain near the anus) TENDERNESS IN MOUTH AND THROAT WITH OR WITHOUT PRESENCE OF ULCERS (sore throat, sores in mouth, or a toothache) UNUSUAL RASH, SWELLING OR PAIN  UNUSUAL VAGINAL DISCHARGE OR ITCHING   Items with * indicate a potential emergency and should be followed up as soon as possible or go to the Emergency Department if any problems should occur.  Please show the CHEMOTHERAPY ALERT CARD or  IMMUNOTHERAPY ALERT CARD at check-in to the Emergency Department and triage nurse.  Should you have questions after your visit or need to cancel or reschedule your appointment, please contact [place] [facility]  [phone] and follow the prompts.  Office hours are 8:00 a.m. to 4:30 p.m. Monday - Friday. Please note that voicemails left after 4:00 p.m. may not be returned until the following business day.  We are closed weekends and major holidays. You have access to a nurse at all times for urgent questions. Please call the main number to the clinic [phone] and follow the prompts.  For any non-urgent questions, you may also contact your provider using MyChart. We now offer e-Visits for anyone 26 and older to request care online for non-urgent symptoms. For details visit [email or web address].   Also download the MyChart app! Go to the app store, search "MyChart", open the app, select , and log in with your MyChart username and password.

## 2023-02-28 ENCOUNTER — Inpatient Hospital Stay: Payer: No Typology Code available for payment source | Attending: Oncology

## 2023-02-28 VITALS — BP 128/71 | HR 93 | Temp 98.0°F | Resp 18

## 2023-02-28 DIAGNOSIS — E876 Hypokalemia: Secondary | ICD-10-CM | POA: Diagnosis not present

## 2023-02-28 DIAGNOSIS — Z79620 Long term (current) use of immunosuppressive biologic: Secondary | ICD-10-CM | POA: Insufficient documentation

## 2023-02-28 DIAGNOSIS — J8410 Pulmonary fibrosis, unspecified: Secondary | ICD-10-CM | POA: Insufficient documentation

## 2023-02-28 DIAGNOSIS — I700 Atherosclerosis of aorta: Secondary | ICD-10-CM | POA: Insufficient documentation

## 2023-02-28 DIAGNOSIS — Z86718 Personal history of other venous thrombosis and embolism: Secondary | ICD-10-CM | POA: Insufficient documentation

## 2023-02-28 DIAGNOSIS — C7951 Secondary malignant neoplasm of bone: Secondary | ICD-10-CM | POA: Insufficient documentation

## 2023-02-28 DIAGNOSIS — C159 Malignant neoplasm of esophagus, unspecified: Secondary | ICD-10-CM

## 2023-02-28 DIAGNOSIS — C7971 Secondary malignant neoplasm of right adrenal gland: Secondary | ICD-10-CM | POA: Insufficient documentation

## 2023-02-28 DIAGNOSIS — N200 Calculus of kidney: Secondary | ICD-10-CM | POA: Insufficient documentation

## 2023-02-28 DIAGNOSIS — C155 Malignant neoplasm of lower third of esophagus: Secondary | ICD-10-CM | POA: Insufficient documentation

## 2023-02-28 DIAGNOSIS — C7800 Secondary malignant neoplasm of unspecified lung: Secondary | ICD-10-CM | POA: Insufficient documentation

## 2023-02-28 DIAGNOSIS — G620 Drug-induced polyneuropathy: Secondary | ICD-10-CM | POA: Diagnosis not present

## 2023-02-28 DIAGNOSIS — K8020 Calculus of gallbladder without cholecystitis without obstruction: Secondary | ICD-10-CM | POA: Insufficient documentation

## 2023-02-28 DIAGNOSIS — N400 Enlarged prostate without lower urinary tract symptoms: Secondary | ICD-10-CM | POA: Insufficient documentation

## 2023-02-28 DIAGNOSIS — C787 Secondary malignant neoplasm of liver and intrahepatic bile duct: Secondary | ICD-10-CM | POA: Diagnosis not present

## 2023-02-28 DIAGNOSIS — Z5111 Encounter for antineoplastic chemotherapy: Secondary | ICD-10-CM | POA: Insufficient documentation

## 2023-02-28 DIAGNOSIS — C7972 Secondary malignant neoplasm of left adrenal gland: Secondary | ICD-10-CM | POA: Insufficient documentation

## 2023-02-28 DIAGNOSIS — I2510 Atherosclerotic heart disease of native coronary artery without angina pectoris: Secondary | ICD-10-CM | POA: Insufficient documentation

## 2023-02-28 DIAGNOSIS — K5730 Diverticulosis of large intestine without perforation or abscess without bleeding: Secondary | ICD-10-CM | POA: Diagnosis not present

## 2023-02-28 DIAGNOSIS — Z79899 Other long term (current) drug therapy: Secondary | ICD-10-CM | POA: Insufficient documentation

## 2023-02-28 DIAGNOSIS — R5383 Other fatigue: Secondary | ICD-10-CM | POA: Insufficient documentation

## 2023-02-28 DIAGNOSIS — Z79631 Long term (current) use of antimetabolite agent: Secondary | ICD-10-CM | POA: Insufficient documentation

## 2023-02-28 DIAGNOSIS — R221 Localized swelling, mass and lump, neck: Secondary | ICD-10-CM | POA: Diagnosis not present

## 2023-02-28 DIAGNOSIS — Z8249 Family history of ischemic heart disease and other diseases of the circulatory system: Secondary | ICD-10-CM | POA: Insufficient documentation

## 2023-02-28 DIAGNOSIS — J700 Acute pulmonary manifestations due to radiation: Secondary | ICD-10-CM | POA: Diagnosis not present

## 2023-02-28 DIAGNOSIS — Z8042 Family history of malignant neoplasm of prostate: Secondary | ICD-10-CM | POA: Insufficient documentation

## 2023-02-28 DIAGNOSIS — R0982 Postnasal drip: Secondary | ICD-10-CM | POA: Diagnosis not present

## 2023-02-28 DIAGNOSIS — R0981 Nasal congestion: Secondary | ICD-10-CM | POA: Diagnosis not present

## 2023-02-28 DIAGNOSIS — Z5112 Encounter for antineoplastic immunotherapy: Secondary | ICD-10-CM | POA: Diagnosis not present

## 2023-02-28 DIAGNOSIS — T451X5A Adverse effect of antineoplastic and immunosuppressive drugs, initial encounter: Secondary | ICD-10-CM | POA: Diagnosis not present

## 2023-02-28 DIAGNOSIS — R059 Cough, unspecified: Secondary | ICD-10-CM | POA: Insufficient documentation

## 2023-02-28 DIAGNOSIS — Z7952 Long term (current) use of systemic steroids: Secondary | ICD-10-CM | POA: Insufficient documentation

## 2023-02-28 DIAGNOSIS — Z803 Family history of malignant neoplasm of breast: Secondary | ICD-10-CM | POA: Insufficient documentation

## 2023-02-28 DIAGNOSIS — Z7901 Long term (current) use of anticoagulants: Secondary | ICD-10-CM | POA: Insufficient documentation

## 2023-02-28 MED ORDER — HEPARIN SOD (PORK) LOCK FLUSH 100 UNIT/ML IV SOLN
500.0000 [IU] | Freq: Once | INTRAVENOUS | Status: AC | PRN
  Administered 2023-02-28: 500 [IU]
  Filled 2023-02-28: qty 5

## 2023-02-28 MED ORDER — SODIUM CHLORIDE 0.9% FLUSH
10.0000 mL | INTRAVENOUS | Status: DC | PRN
  Administered 2023-02-28: 10 mL
  Filled 2023-02-28: qty 10

## 2023-03-03 ENCOUNTER — Telehealth: Payer: Self-pay

## 2023-03-03 DIAGNOSIS — J018 Other acute sinusitis: Secondary | ICD-10-CM | POA: Diagnosis not present

## 2023-03-03 DIAGNOSIS — J302 Other seasonal allergic rhinitis: Secondary | ICD-10-CM | POA: Diagnosis not present

## 2023-03-03 DIAGNOSIS — R0982 Postnasal drip: Secondary | ICD-10-CM | POA: Diagnosis not present

## 2023-03-03 NOTE — Telephone Encounter (Signed)
Called and Left message on Stephanie's VM.

## 2023-03-03 NOTE — Telephone Encounter (Signed)
Stephanie from [facility] called stating they need clearance on a medication for the patient. Medication Is Augmentin  875 mg for 2 weeks. Prescribing this antibiotic to treat an sinus infection. She states if this medication can not be cleared could MD let them know an antibiotic she would recommenced for an sinus infection.Call back [phone] ext [phone]

## 2023-03-04 ENCOUNTER — Encounter: Payer: Self-pay | Admitting: Oncology

## 2023-03-04 DIAGNOSIS — M9901 Segmental and somatic dysfunction of cervical region: Secondary | ICD-10-CM | POA: Diagnosis not present

## 2023-03-04 DIAGNOSIS — M9903 Segmental and somatic dysfunction of lumbar region: Secondary | ICD-10-CM | POA: Diagnosis not present

## 2023-03-04 DIAGNOSIS — M62830 Muscle spasm of back: Secondary | ICD-10-CM | POA: Diagnosis not present

## 2023-03-04 DIAGNOSIS — M9902 Segmental and somatic dysfunction of thoracic region: Secondary | ICD-10-CM | POA: Diagnosis not present

## 2023-03-11 MED FILL — Dexamethasone Sodium Phosphate Inj 100 MG/10ML: INTRAMUSCULAR | Qty: 1 | Status: AC

## 2023-03-12 ENCOUNTER — Inpatient Hospital Stay: Payer: No Typology Code available for payment source

## 2023-03-12 ENCOUNTER — Encounter: Payer: Self-pay | Admitting: Oncology

## 2023-03-12 ENCOUNTER — Inpatient Hospital Stay: Payer: No Typology Code available for payment source | Admitting: Oncology

## 2023-03-12 VITALS — BP 120/70 | HR 77 | Temp 97.0°F | Resp 18 | Wt 220.2 lb

## 2023-03-12 DIAGNOSIS — E876 Hypokalemia: Secondary | ICD-10-CM

## 2023-03-12 DIAGNOSIS — C159 Malignant neoplasm of esophagus, unspecified: Secondary | ICD-10-CM

## 2023-03-12 DIAGNOSIS — T451X5A Adverse effect of antineoplastic and immunosuppressive drugs, initial encounter: Secondary | ICD-10-CM

## 2023-03-12 DIAGNOSIS — R0981 Nasal congestion: Secondary | ICD-10-CM

## 2023-03-12 DIAGNOSIS — Z5111 Encounter for antineoplastic chemotherapy: Secondary | ICD-10-CM

## 2023-03-12 DIAGNOSIS — Z5112 Encounter for antineoplastic immunotherapy: Secondary | ICD-10-CM | POA: Diagnosis not present

## 2023-03-12 DIAGNOSIS — I82621 Acute embolism and thrombosis of deep veins of right upper extremity: Secondary | ICD-10-CM

## 2023-03-12 DIAGNOSIS — G620 Drug-induced polyneuropathy: Secondary | ICD-10-CM | POA: Diagnosis not present

## 2023-03-12 LAB — CMP (CANCER CENTER ONLY)
ALT: 22 U/L (ref 0–44)
AST: 25 U/L (ref 15–41)
Albumin: 3.3 g/dL — ABNORMAL LOW (ref 3.5–5.0)
Alkaline Phosphatase: 75 U/L (ref 38–126)
Anion gap: 7 (ref 5–15)
BUN: 10 mg/dL (ref 8–23)
CO2: 25 mmol/L (ref 22–32)
Calcium: 9.1 mg/dL (ref 8.9–10.3)
Chloride: 105 mmol/L (ref 98–111)
Creatinine: 0.96 mg/dL (ref 0.61–1.24)
GFR, Estimated: >60 mL/min (ref >60)
Glucose, Bld: 159 mg/dL — ABNORMAL HIGH (ref 70–99)
Potassium: 3 mmol/L — ABNORMAL LOW (ref 3.5–5.1)
Sodium: 137 mmol/L (ref 135–145)
Total Bilirubin: 0.5 mg/dL (ref 0.3–1.2)
Total Protein: 7.2 g/dL (ref 6.5–8.1)

## 2023-03-12 LAB — CBC WITH DIFFERENTIAL (CANCER CENTER ONLY)
Abs Immature Granulocytes: 0.05 10*3/uL (ref 0.00–0.07)
Basophils Absolute: 0.1 10*3/uL (ref 0.0–0.1)
Basophils Relative: 2 %
Eosinophils Absolute: 0.1 10*3/uL (ref 0.0–0.5)
Eosinophils Relative: 3 %
HCT: 33.2 % — ABNORMAL LOW (ref 39.0–52.0)
Hemoglobin: 10.2 g/dL — ABNORMAL LOW (ref 13.0–17.0)
Immature Granulocytes: 1 %
Lymphocytes Relative: 14 %
Lymphs Abs: 0.7 10*3/uL (ref 0.7–4.0)
MCH: 27.1 pg (ref 26.0–34.0)
MCHC: 30.7 g/dL (ref 30.0–36.0)
MCV: 88.3 fL (ref 80.0–100.0)
Monocytes Absolute: 0.6 10*3/uL (ref 0.1–1.0)
Monocytes Relative: 12 %
Neutro Abs: 3.6 10*3/uL (ref 1.7–7.7)
Neutrophils Relative %: 68 %
Platelet Count: 262 10*3/uL (ref 150–400)
RBC: 3.76 MIL/uL — ABNORMAL LOW (ref 4.22–5.81)
RDW: 15.7 % — ABNORMAL HIGH (ref 11.5–15.5)
WBC Count: 5.2 10*3/uL (ref 4.0–10.5)
nRBC: 0 % (ref 0.0–0.2)

## 2023-03-12 MED ORDER — FLUOROURACIL CHEMO INJECTION 2.5 GM/50ML
400.0000 mg/m2 | Freq: Once | INTRAVENOUS | Status: AC
  Administered 2023-03-12: 900 mg via INTRAVENOUS
  Filled 2023-03-12: qty 18

## 2023-03-12 MED ORDER — SODIUM CHLORIDE 0.9 % IV SOLN
400.0000 mg/m2 | Freq: Once | INTRAVENOUS | Status: AC
  Administered 2023-03-12: 896 mg via INTRAVENOUS
  Filled 2023-03-12: qty 44.8

## 2023-03-12 MED ORDER — PALONOSETRON HCL INJECTION 0.25 MG/5ML
0.2500 mg | Freq: Once | INTRAVENOUS | Status: AC
  Administered 2023-03-12: 0.25 mg via INTRAVENOUS
  Filled 2023-03-12: qty 5

## 2023-03-12 MED ORDER — SODIUM CHLORIDE 0.9% FLUSH
10.0000 mL | Freq: Once | INTRAVENOUS | Status: AC
  Administered 2023-03-12: 10 mL via INTRAVENOUS
  Filled 2023-03-12: qty 10

## 2023-03-12 MED ORDER — SODIUM CHLORIDE 0.9 % IV SOLN
10.0000 mg | Freq: Once | INTRAVENOUS | Status: AC
  Administered 2023-03-12: 10 mg via INTRAVENOUS
  Filled 2023-03-12: qty 10

## 2023-03-12 MED ORDER — ZAFIRLUKAST 20 MG PO TABS: 20.0000 mg | ORAL_TABLET | Freq: Two times a day (BID) | ORAL | 0 refills | Status: DC

## 2023-03-12 MED ORDER — HEPARIN SOD (PORK) LOCK FLUSH 100 UNIT/ML IV SOLN
500.0000 [IU] | Freq: Once | INTRAVENOUS | Status: DC
  Filled 2023-03-12: qty 5

## 2023-03-12 MED ORDER — SODIUM CHLORIDE 0.9 % IV SOLN
2400.0000 mg/m2 | INTRAVENOUS | Status: DC
  Administered 2023-03-12: 5000 mg via INTRAVENOUS
  Filled 2023-03-12: qty 100

## 2023-03-12 MED ORDER — SODIUM CHLORIDE 0.9 % IV SOLN
240.0000 mg | Freq: Once | INTRAVENOUS | Status: AC
  Administered 2023-03-12: 240 mg via INTRAVENOUS
  Filled 2023-03-12: qty 24

## 2023-03-12 MED ORDER — POTASSIUM CHLORIDE 20 MEQ/100ML IV SOLN
20.0000 meq | Freq: Once | INTRAVENOUS | Status: AC
  Administered 2023-03-12: 20 meq via INTRAVENOUS

## 2023-03-12 MED ORDER — SODIUM CHLORIDE 0.9 % IV SOLN
INTRAVENOUS | Status: DC
  Filled 2023-03-12: qty 250

## 2023-03-12 NOTE — Patient Instructions (Signed)
[place] [facility]  Discharge Instructions: Thank you for choosing [facility] to provide your oncology and hematology care.  If you have a lab appointment with the Cancer Center, please go directly to the Cancer Center and check in at the registration area.  Wear comfortable clothing and clothing appropriate for easy access to any Portacath or PICC line.   We strive to give you quality time with your provider. You may need to reschedule your appointment if you arrive late (15 or more minutes).  Arriving late affects you and other patients whose appointments are after yours.  Also, if you miss three or more appointments without notifying the office, you may be dismissed from the clinic at the provider's discretion.      For prescription refill requests, have your pharmacy contact our office and allow 72 hours for refills to be completed.    Today you received the following chemotherapy and/or immunotherapy agents opdivo, leucovorin, and adrucil      To help prevent nausea and vomiting after your treatment, we encourage you to take your nausea medication as directed.  BELOW ARE SYMPTOMS THAT SHOULD BE REPORTED IMMEDIATELY: *FEVER GREATER THAN 100.4 F (38 C) OR HIGHER *CHILLS OR SWEATING *NAUSEA AND VOMITING THAT IS NOT CONTROLLED WITH YOUR NAUSEA MEDICATION *UNUSUAL SHORTNESS OF BREATH *UNUSUAL BRUISING OR BLEEDING *URINARY PROBLEMS (pain or burning when urinating, or frequent urination) *BOWEL PROBLEMS (unusual diarrhea, constipation, pain near the anus) TENDERNESS IN MOUTH AND THROAT WITH OR WITHOUT PRESENCE OF ULCERS (sore throat, sores in mouth, or a toothache) UNUSUAL RASH, SWELLING OR PAIN  UNUSUAL VAGINAL DISCHARGE OR ITCHING   Items with * indicate a potential emergency and should be followed up as soon as possible or go to the Emergency Department if any problems should occur.  Please show the CHEMOTHERAPY ALERT CARD or IMMUNOTHERAPY  ALERT CARD at check-in to the Emergency Department and triage nurse.  Should you have questions after your visit or need to cancel or reschedule your appointment, please contact [place] [facility]  [phone] and follow the prompts.  Office hours are 8:00 a.m. to 4:30 p.m. Monday - Friday. Please note that voicemails left after 4:00 p.m. may not be returned until the following business day.  We are closed weekends and major holidays. You have access to a nurse at all times for urgent questions. Please call the main number to the clinic [phone] and follow the prompts.  For any non-urgent questions, you may also contact your provider using MyChart. We now offer e-Visits for anyone 23 and older to request care online for non-urgent symptoms. For details visit [email or web address].   Also download the MyChart app! Go to the app store, search "MyChart", open the app, select [place], and log in with your MyChart username and password.

## 2023-03-12 NOTE — Assessment & Plan Note (Signed)
Continue Eliquis 5mg BID 

## 2023-03-12 NOTE — Progress Notes (Signed)
Hematology/Oncology Progress note [phone] [phone]        REFERRING PROVIDER: Rickard Patience, MD  CHIEF COMPLAINTS/PURPOSE OF CONSULTATION:  Stage IV Esophageal adenocarcinoma  ASSESSMENT & PLAN:   Cancer Staging  Adenocarcinoma of esophagus [facility]) Staging form: Esophagus - Adenocarcinoma, AJCC 8th Edition - Clinical stage from 08/28/2022: Stage IVB (cT3, cN1, cM1) - Signed by Rickard Patience, MD on 10/04/2022   Adenocarcinoma of esophagus (HCC) Stage IV esophageal adenocarcinoma, liver metastatic disease I recommend systematic palliative chemotherapy with FOLFOX [Oxaliplatin 75mg /m2 while on RT] .  Labs are reviewed and discussed with patient. PET showed progression.  proceed with 5-FU nivolumab Q2 weeks.   CEA continues to rise.  Repeat CT chest abdomen pelvis w contrast to evaluate response.  I will hold off oxaliplatin as patient had developed recurrent drug rash that needed high dose steroid.   Hypokalemia Chronic issue for him Cotinue KCL to BID. .  IV Kcl today    Encounter for antineoplastic chemotherapy Chemotherapy plan as listed above.   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   Sinus congestion Sinus congestion with postnasal drip which triggers cough. Recommend Flonase nasal spray as well as adding azelastine nasal spray Finish course of antibiotics prescribed by ENT Did not tolerate Singulair, stop Recommend Zafirlukast trials 20mg  BID     Orders Placed This Encounter  Procedures   CT CHEST ABDOMEN PELVIS W CONTRAST    Standing Status:   Future    Standing Expiration Date:   03/11/2024    Order Specific Question:   If indicated for the ordered procedure, I authorize the administration of contrast media per Radiology protocol    Answer:   Yes    Order Specific Question:   Does the patient have a contrast media/X-ray dye allergy?    Answer:    No    Order Specific Question:   Preferred imaging location?    Answer:   [facility]    Order Specific Question:   If indicated for the ordered procedure, I authorize the administration of oral contrast media per Radiology protocol    Answer:   Yes   CEA    Standing Status:   Future    Standing Expiration Date:   03/25/2024   CBC with Differential (Cancer Center Only)    Standing Status:   Future    Standing Expiration Date:   03/25/2024   CMP (Cancer Center only)    Standing Status:   Future    Standing Expiration Date:   03/25/2024    Follow-up 2 weeks.   All questions were answered. The patient knows to call the clinic with any problems, questions or concerns.  Rickard Patience, MD, PhD [facility] Hematology Oncology 03/12/2023   HISTORY OF PRESENTING ILLNESS:  Jonathan [name] 67 y.o. male presents to establish care for esophageal adenocarcinoma I have reviewed his chart and materials related to his cancer extensively and collaborated history with the patient. Summary of oncologic history is as follows: Oncology History  Adenocarcinoma of esophagus (HCC)  08/28/2022 Initial Diagnosis   Adenocarcinoma of esophagus   -Patient has noticed worsening of "food stuck/fullness" sensation since November 2023.  Patient had a barium swallow study which commented on marked mucosal irregularity in the distal esophagus with Broaddus base mural filling defect highly suspicious for malignancy.  Patient establish care with gastroenterology. -08/23/2022, EGD showed gastritis and partially obstructing malignant esophageal tumor in the lower  third of the esophagus. Esophagus mass biopsy showed adenocarcinoma.  PD-L1 TPS 65%, HER2 negative.  [facility] NGS showed KRASG12V, CDKN2A, ARID1A, TP53, TMB 5.3, MSI stable.  Stomach biopsy showed gastric mucosa with no specific histology abnormality.  No significant intestinal metaplastic, dysplastic, granular atrophy or increased inflammation.     08/28/2022  Imaging   CT chest abdomen pelvis with contrast showed 1. Distal esophageal primary with gastrohepatic ligament nodal metastasis. 2. 2 right-sided pulmonary nodules, the largest of which measures 5 mm and is new since 2015. Pulmonary metastasis not be excluded. 3. Anterior right lower lobe volume loss and minimal soft tissue density, favoring atelectasis or scar. Recommend attention on follow-up. 4. Hepatic steatosis 5. Cholelithiasis 6. Left nephrolithiasis 7. Coronary artery atherosclerosis. Aortic Atherosclerosis   08/28/2022 Cancer Staging   Staging form: Esophagus - Adenocarcinoma, AJCC 8th Edition - Clinical stage from 08/28/2022: Stage IVB (cT3, cN1, cM1) - Signed by Rickard Patience, MD on 10/04/2022 Stage prefix: Initial diagnosis   09/06/2022 Imaging   PET scan showed 1. Esophageal primary with gastrohepatic ligament nodal metastasis,as on CT. 2. Focus of hypermetabolism which is favored to registered to the posterior hepatic dome, in the region of subtle heterogeneity on prior diagnostic CT. Suboptimally evaluated secondary to underlying steatosis. Recommend further evaluation with pre and post contrast abdominal MRI to confirm probable metastasis. 3. Incidental findings, including: Left nephrolithiasis.Cholelithiasis. Coronary artery atherosclerosis. Aortic Atherosclerosis    09/14/2022 - 09/14/2022 Chemotherapy   Patient is on Treatment Plan : ESOPHAGUS Carboplatin + Paclitaxel Weekly X 6 Weeks with XRT     09/23/2022 Imaging   MRI abdomen with and without contrast showed 1. Mildly T2 hyperintense segment VII hepatic lesion measuring 2.7 cm with imaging characteristics compatible with metastatic disease. 2. Tiny focus of delayed enhancement in the inferior right lobe of the liver segment VI measuring 8 mm with ill-defined increased T2 signal and subtle corresponding reduced diffusivity, also suspicious for metastatic disease. 3. Partially visualized distal esophageal wall thickening  compatible with the patient's known primary neoplasm. 4. Similar size of the 11 mm gastrohepatic ligament lymph node mildly metabolic on prior PET-CT and compatible with local nodal disease involvement. 5. 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 Recommend follow up pre and post-contrast MRI/MRCP in 1 year. 6. Diffuse hepatic steatosis.   10/10/2022 Procedure   LIVER MASS; CT-GUIDED BIOPSY:  - MODERATE TO POORLY DIFFERENTIATED ADENOCARCINOMA MORPHOLOGICALLY  CONSISTENT WITH METASTASIS FROM PATIENT'S KNOWN ESOPHAGEAL  ADENOCARCINOMA.    10/17/2022 Procedure   Medi port placed by Dr. Wyn Quaker   10/21/2022 - 01/15/2023 Chemotherapy   Patient is on Treatment Plan : ESOPHAGEAL ADENOCARCINOMA FOLFOX q14d x 6 cycles     01/15/2023 Imaging   PET showed  1. Interval progression in metastatic esophageal carcinoma as evidenced by hypermetabolic lymph nodes in the neck, chest, abdomen and pelvis, an enlarging right hepatic lobe metastasis, new bilateral adrenal metastases and new osseous metastases. 2. Cholelithiasis. 3. Left renal stones. 4. Aortic atherosclerosis (ICD10-I70.0). Coronary artery calcification.    01/27/2023 -  Chemotherapy   Patient is on Treatment Plan : GASTROESOPHAGEAL FOLFOX + Nivolumab q14d     Patient presents to establish care.  He is not taking PPI. He has intentionally lost some weight. Family history positive for father and paternal uncle with prostate cancer and sister with breast cancer. Denies any routine alcohol use  Right interval jugular vein occlusive DVT, started on Eliquis starter  package on 10/28/2022, swelling of neck has improved.   CXR showed Interval development of bilateral perihilar interstitial opacities  He was treated with Azithromycin and Doxycycline to cover atypical infections.   INTERVAL HISTORY Jonathan [name] is a 67 y.o. male who has above history reviewed by  me today presents for follow up visit for Stage IV esophageal adenocarcinoma.  He feels more fatigued.  He continues to have sinus drainage and cough.  Postnasal drip Seen by ENT, was prescribed for antibiotics for sinusitis.  He denies any SOB. He coughs because of postnasal drip.     MEDICAL HISTORY:  Past Medical History:  Diagnosis Date   Arthritis    Complication of anesthesia    OCCURRED ONCE YEARS AGO 1981   Esophageal mass    Hypertension    Hypothyroidism    PONV (postoperative nausea and vomiting)     SURGICAL HISTORY: Past Surgical History:  Procedure Laterality Date   COLONOSCOPY     ESOPHAGOGASTRODUODENOSCOPY N/A 08/23/2022   Procedure: ESOPHAGOGASTRODUODENOSCOPY (EGD);  Surgeon: Jaynie Collins, DO;  Location: [facility] ENDOSCOPY;  Service: Gastroenterology;  Laterality: N/A;   EUS N/A 09/12/2022   Procedure: FULL UPPER ENDOSCOPIC ULTRASOUND (EUS) RADIAL;  Surgeon: Bearl Mulberry, MD;  Location: [facility] ENDOSCOPY;  Service: Gastroenterology;  Laterality: N/A;   FINGER SURGERY     PORTA CATH INSERTION N/A 10/17/2022   Procedure: PORTA CATH INSERTION;  Surgeon: Annice Needy, MD;  Location: [facility] INVASIVE CV LAB;  Service: Cardiovascular;  Laterality: N/A;   TENDON REPAIR IN LEFT KNEE      SOCIAL HISTORY: Social History   Socioeconomic History   Marital status: Single    Spouse name: Not on file   Number of children: Not on file   Years of education: Not on file   Highest education level: Not on file  Occupational History   Not on file  Tobacco Use   Smoking status: Never   Smokeless tobacco: Never  Vaping Use   Vaping status: Never Used  Substance and Sexual Activity   Alcohol use: Yes    Comment: OCCASIONALLY   Drug use: Never   Sexual activity: Not on file  Other Topics Concern   Not on file  Social History Narrative   Not on file   Social Determinants of Health   Financial Resource Strain: Low Risk  (10/29/2022)   Received from [facility], [facility]   Overall Financial Resource Strain (CARDIA)    Difficulty of Paying Living Expenses: Not hard at all  Food Insecurity: No Food Insecurity (10/29/2022)   Received from [facility], [facility]   Hunger Vital Sign    Worried About Running Out of Food in the Last Year: Never true    Ran Out of Food in the Last Year: Never true  Transportation Needs: No Transportation Needs (10/29/2022)   Received from [facility], [facility]   [facility] - Transportation    In the past 12 months, has lack of transportation kept you from medical appointments or from getting medications?: No    Lack of Transportation (Non-Medical): No  Physical Activity: Not on file  Stress: Not on file  Social Connections: Not on file  Intimate Partner Violence: Not At Risk (08/28/2022)   Humiliation, Afraid, Rape, and Kick questionnaire    Fear of Current or Ex-Partner: No    Emotionally Abused: No    Physically Abused: No  Sexually Abused: No    FAMILY HISTORY: Family History  Problem Relation Age of Onset   Heart attack Mother    Prostate cancer Father    Breast cancer Sister    Prostate cancer Paternal Uncle     ALLERGIES:  has No Known Allergies.  MEDICATIONS:  Current Outpatient Medications  Medication Sig Dispense Refill   amLODipine (NORVASC) 10 MG tablet Take 10 mg by mouth daily.     amoxicillin-clavulanate (AUGMENTIN) 875-125 MG tablet SMARTSIG:1 Tablet(s) By Mouth Every 12 Hours     atenolol (TENORMIN) 50 MG tablet Take 50 mg by mouth daily.     azelastine (ASTELIN) 0.1 % nasal spray Place 1 spray into both nostrils 2 (two) times daily. Use in each nostril as directed 30 mL 1   benzonatate (TESSALON) 200 MG capsule Take 1 capsule (200 mg total) by mouth 3 (three) times daily as needed for cough. 20 capsule 5   cetirizine (ZYRTEC ALLERGY) 10 MG tablet Take 1 tablet (10 mg total)  by mouth at bedtime. 30 tablet 11   chlorhexidine (PERIDEX) 0.12 % solution Use as directed 15 mLs in the mouth or throat 2 (two) times daily. 473 mL 1   Cholecalciferol (VITAMIN D3) 125 MCG (5000 UT) CAPS Take 5,000 Units by mouth daily.     ELIQUIS 5 MG TABS tablet Take 1 tablet by mouth twice daily 60 tablet 0   hydrochlorothiazide (HYDRODIURIL) 25 MG tablet Take 25 mg by mouth daily.     hydrocortisone-nystatin-lidocaine in diphenhydrAMINE liquid Swish and swallow 5 mLs by mouth 3 (three) times daily as needed for mouth pain. 200 mL 2   levothyroxine (SYNTHROID) 125 MCG tablet Take 125 mcg by mouth every morning.     Multiple Vitamins-Minerals (MULTIVITAMIN WITH MINERALS) tablet Take 1 tablet by mouth daily.     omeprazole (PRILOSEC) 20 MG capsule Take 20 mg by mouth daily.     ondansetron (ZOFRAN) 8 MG tablet Take 1 tablet (8 mg total) by mouth every 8 (eight) hours as needed for nausea or vomiting. Start on the third day after chemotherapy.     potassium chloride SA (KLOR-CON M) 20 MEQ tablet Take 2 tablets (40 mEq total) by mouth 2 (two) times daily. 120 tablet 0   prochlorperazine (COMPAZINE) 10 MG tablet Take 1 tablet (10 mg total) by mouth every 6 (six) hours as needed for nausea or vomiting.     zafirlukast (ACCOLATE) 20 MG tablet Take 1 tablet (20 mg total) by mouth 2 (two) times daily before a meal. 60 tablet 0   acetaminophen (TYLENOL) 650 MG CR tablet Take 1,300 mg by mouth every 8 (eight) hours as needed for pain. (Patient not taking: Reported on 02/19/2023)     No current facility-administered medications for this visit.   Facility-Administered Medications Ordered in Other Visits  Medication Dose Route Frequency Provider Last Rate Last Admin   0.9 %  sodium chloride infusion   Intravenous Continuous Rickard Patience, MD 20 mL/hr at 03/12/23 0922 New Bag at 03/12/23 0922   fluorouracil (ADRUCIL) 5,000 mg in sodium chloride 0.9 % 150 mL chemo infusion  2,400 mg/m2 (Treatment Plan Recorded)  Intravenous 1 day or 1 dose Rickard Patience, MD       fluorouracil (ADRUCIL) chemo injection 900 mg  400 mg/m2 (Treatment Plan Recorded) Intravenous Once Rickard Patience, MD       heparin lock flush 100 unit/mL  500 Units Intravenous Once Rickard Patience, MD       leucovorin  896 mg in sodium chloride 0.9 % 250 mL infusion  400 mg/m2 (Treatment Plan Recorded) Intravenous Once Rickard Patience, MD       nivolumab (OPDIVO) 240 mg in sodium chloride 0.9 % 100 mL chemo infusion  240 mg Intravenous Once Rickard Patience, MD 248 mL/hr at 03/12/23 1029 240 mg at 03/12/23 1029    Review of Systems  Constitutional:  Negative for appetite change, chills, fatigue and fever.  HENT:   Negative for hearing loss and voice change.        Nasal congestion, postnasal drip.   Eyes:  Negative for eye problems and icterus.  Respiratory:  Negative for chest tightness, cough and shortness of breath.   Cardiovascular:  Negative for chest pain and leg swelling.  Gastrointestinal:  Negative for abdominal distention, abdominal pain, nausea and vomiting.  Endocrine: Negative for hot flashes.  Genitourinary:  Negative for difficulty urinating, dysuria and frequency.   Musculoskeletal:  Negative for arthralgias.  Skin:  Negative for itching and rash.  Neurological:  Negative for light-headedness and numbness.  Hematological:  Negative for adenopathy. Does not bruise/bleed easily.  Psychiatric/Behavioral:  Negative for confusion.      PHYSICAL EXAMINATION: ECOG PERFORMANCE STATUS: 0 - Asymptomatic  Vitals:   03/12/23 0833  BP: 120/70  Pulse: 77  Resp: 18  Temp: (!) 97 F (36.1 C)  SpO2: 97%   Filed Weights   03/12/23 0833  Weight: 220 lb 3.2 oz (99.9 kg)    Physical Exam Constitutional:      General: He is not in acute distress.    Appearance: He is obese. He is not diaphoretic.  HENT:     Head: Normocephalic and atraumatic.  Eyes:     General: No scleral icterus.    Pupils: Pupils are equal, round, and reactive to light.   Cardiovascular:     Rate and Rhythm: Normal rate and regular rhythm.     Heart sounds: No murmur heard. Pulmonary:     Effort: Pulmonary effort is normal. No respiratory distress.     Breath sounds: No rales.  Chest:     Chest wall: No tenderness.  Abdominal:     General: There is no distension.     Palpations: Abdomen is soft.     Tenderness: There is no abdominal tenderness.  Musculoskeletal:        General: Normal range of motion.     Cervical back: Normal range of motion and neck supple.  Skin:    General: Skin is warm and dry.     Findings: No erythema.  Neurological:     Mental Status: He is alert and oriented to person, place, and time. Mental status is at baseline.     Cranial Nerves: No cranial nerve deficit.     Motor: No abnormal muscle tone.     Coordination: Coordination normal.  Psychiatric:        Mood and Affect: Mood and affect normal.      LABORATORY DATA:  I have reviewed the data as listed    Latest Ref Rng & Units 03/12/2023    8:02 AM 02/26/2023    7:59 AM 02/19/2023   12:54 PM  CBC  WBC 4.0 - 10.5 K/uL 5.2  5.0  6.3   Hemoglobin 13.0 - 17.0 g/dL 62.6  94.8  54.6   Hematocrit 39.0 - 52.0 % 33.2  32.5  34.7   Platelets 150 - 400 K/uL 262  282  234  Latest Ref Rng & Units 03/12/2023    8:02 AM 02/26/2023    7:59 AM 02/19/2023   12:54 PM  CMP  Glucose 70 - 99 mg/dL 409  811  914   BUN 8 - 23 mg/dL 10  12  15    Creatinine 0.61 - 1.24 mg/dL 7.82  9.56  2.13   Sodium 135 - 145 mmol/L 137  138  135   Potassium 3.5 - 5.1 mmol/L 3.0  3.1  3.1   Chloride 98 - 111 mmol/L 105  106  100   CO2 22 - 32 mmol/L 25  23  26    Calcium 8.9 - 10.3 mg/dL 9.1  9.2  8.9   Total Protein 6.5 - 8.1 g/dL 7.2  7.1  7.9   Total Bilirubin 0.3 - 1.2 mg/dL 0.5  0.7  0.6   Alkaline Phos 38 - 126 U/L 75  66  69   AST 15 - 41 U/L 25  27  44   ALT 0 - 44 U/L 22  23  42      RADIOGRAPHIC STUDIES: I have personally reviewed the radiological images as listed and agreed  with the findings in the report. DG Chest 2 View  Result Date: 02/19/2023 CLINICAL DATA:  Cough.  Esophageal cancer EXAM: CHEST - 2 VIEW COMPARISON:  01/08/2023 FINDINGS: Right-sided chest port terminates within the distal SVC. Normal heart size. New bilateral perihilar interstitial opacities, right worse than left. No pleural effusion or pneumothorax. IMPRESSION: Interval development of bilateral perihilar interstitial opacities, which may reflect edema or atypical infection. Electronically Signed   By: Duanne Guess D.O.   On: 02/19/2023 15:01

## 2023-03-12 NOTE — Assessment & Plan Note (Signed)
Sinus congestion with postnasal drip which triggers cough. Recommend Flonase nasal spray as well as adding azelastine nasal spray Finish course of antibiotics prescribed by ENT Did not tolerate Singulair, stop Recommend Zafirlukast trials 20mg  BID

## 2023-03-12 NOTE — Assessment & Plan Note (Signed)
Grade 1-2, observation.

## 2023-03-12 NOTE — Assessment & Plan Note (Addendum)
Stage IV esophageal adenocarcinoma, liver metastatic disease I recommend systematic palliative chemotherapy with FOLFOX [Oxaliplatin 75mg /m2 while on RT] .  Labs are reviewed and discussed with patient. PET showed progression.  proceed with 5-FU nivolumab Q2 weeks.   CEA continues to rise.  Repeat CT chest abdomen pelvis w contrast to evaluate response.  I will hold off oxaliplatin as patient had developed recurrent drug rash that needed high dose steroid.

## 2023-03-12 NOTE — Progress Notes (Signed)
Nutrition Follow-up:  Patient with esophageal cancer.  Patient on 5FU and nivolumab.  Met with patient during infusion. Patient reports that appetite is about the same.  Has been down with sinus drainage.  Currently on antibiotics.  Drinking 2-3 shakes a day.  Eating cereal for breakfast.  Supper last night was broccoli and cheese soup and leftover pizza.  Had ice cream last night after dinner.  Enjoys milk and reese's cups.    Medications: reviewed  Labs: reviewed  Anthropometrics:   Weight 220 lb 3.2 oz today  223 lb 1.6 oz on 7/31 231 lb on 7/1 236 lb 14.4 oz on 5/13 240 lb 1.6 oz on 4/22 244 lb 1.6 oz on 4/9 265 lb on 1/31   NUTRITION DIAGNOSIS: Inadequate oral intake continues with ongoing weight loss   INTERVENTION:  Continue 350 calorie shake 2-3 times per day Continue high calorie, high protein diet to help maintain weight    MONITORING, EVALUATION, GOAL: weight trends, intake   NEXT VISIT: Wednesday, August 28 during infusion   B. Freida Busman, RD, LDN Registered Dietitian [phone]

## 2023-03-12 NOTE — Assessment & Plan Note (Signed)
Chronic issue for him Cotinue KCL to BID. .  IV Kcl today

## 2023-03-12 NOTE — Assessment & Plan Note (Signed)
Chemotherapy plan as listed above 

## 2023-03-13 LAB — CEA: CEA: 37.3 ng/mL — ABNORMAL HIGH (ref 0.0–4.7)

## 2023-03-14 ENCOUNTER — Inpatient Hospital Stay: Payer: No Typology Code available for payment source

## 2023-03-14 DIAGNOSIS — Z5112 Encounter for antineoplastic immunotherapy: Secondary | ICD-10-CM | POA: Diagnosis not present

## 2023-03-14 DIAGNOSIS — C159 Malignant neoplasm of esophagus, unspecified: Secondary | ICD-10-CM

## 2023-03-14 MED ORDER — HEPARIN SOD (PORK) LOCK FLUSH 100 UNIT/ML IV SOLN
500.0000 [IU] | Freq: Once | INTRAVENOUS | Status: AC | PRN
  Administered 2023-03-14: 500 [IU]
  Filled 2023-03-14: qty 5

## 2023-03-19 ENCOUNTER — Ambulatory Visit
Admission: RE | Admit: 2023-03-19 | Discharge: 2023-03-19 | Disposition: A | Discharge status: Home / Self Care | Payer: No Typology Code available for payment source | Source: Ambulatory Visit | Attending: Oncology | Admitting: Oncology

## 2023-03-19 DIAGNOSIS — J8410 Pulmonary fibrosis, unspecified: Secondary | ICD-10-CM | POA: Diagnosis not present

## 2023-03-19 DIAGNOSIS — C159 Malignant neoplasm of esophagus, unspecified: Secondary | ICD-10-CM | POA: Diagnosis not present

## 2023-03-19 DIAGNOSIS — C7951 Secondary malignant neoplasm of bone: Secondary | ICD-10-CM | POA: Diagnosis not present

## 2023-03-19 DIAGNOSIS — C801 Malignant (primary) neoplasm, unspecified: Secondary | ICD-10-CM | POA: Diagnosis not present

## 2023-03-19 DIAGNOSIS — C787 Secondary malignant neoplasm of liver and intrahepatic bile duct: Secondary | ICD-10-CM | POA: Diagnosis not present

## 2023-03-19 MED ORDER — IOHEXOL 300 MG/ML  SOLN
100.0000 mL | Freq: Once | INTRAMUSCULAR | Status: AC | PRN
  Administered 2023-03-19: 100 mL via INTRAVENOUS

## 2023-03-21 ENCOUNTER — Encounter: Payer: Self-pay | Admitting: Oncology

## 2023-03-24 ENCOUNTER — Other Ambulatory Visit: Payer: Self-pay | Admitting: Oncology

## 2023-03-24 ENCOUNTER — Encounter: Payer: Self-pay | Admitting: Oncology

## 2023-03-24 DIAGNOSIS — D2261 Melanocytic nevi of right upper limb, including shoulder: Secondary | ICD-10-CM | POA: Diagnosis not present

## 2023-03-24 DIAGNOSIS — L408 Other psoriasis: Secondary | ICD-10-CM | POA: Diagnosis not present

## 2023-03-24 DIAGNOSIS — D2271 Melanocytic nevi of right lower limb, including hip: Secondary | ICD-10-CM | POA: Diagnosis not present

## 2023-03-24 DIAGNOSIS — D225 Melanocytic nevi of trunk: Secondary | ICD-10-CM | POA: Diagnosis not present

## 2023-03-24 DIAGNOSIS — R0982 Postnasal drip: Secondary | ICD-10-CM | POA: Diagnosis not present

## 2023-03-24 DIAGNOSIS — C159 Malignant neoplasm of esophagus, unspecified: Secondary | ICD-10-CM

## 2023-03-24 DIAGNOSIS — D485 Neoplasm of uncertain behavior of skin: Secondary | ICD-10-CM | POA: Diagnosis not present

## 2023-03-24 DIAGNOSIS — L570 Actinic keratosis: Secondary | ICD-10-CM | POA: Diagnosis not present

## 2023-03-24 DIAGNOSIS — D2262 Melanocytic nevi of left upper limb, including shoulder: Secondary | ICD-10-CM | POA: Diagnosis not present

## 2023-03-24 DIAGNOSIS — D2272 Melanocytic nevi of left lower limb, including hip: Secondary | ICD-10-CM | POA: Diagnosis not present

## 2023-03-24 DIAGNOSIS — L218 Other seborrheic dermatitis: Secondary | ICD-10-CM | POA: Diagnosis not present

## 2023-03-24 MED ORDER — PREDNISONE 20 MG PO TABS: 60.0000 mg | ORAL_TABLET | Freq: Every day | ORAL | 0 refills | Status: DC

## 2023-03-24 NOTE — Progress Notes (Signed)
DISCONTINUE ON PATHWAY REGIMEN - Gastroesophageal     A cycle is every 14 days:     Nivolumab      Oxaliplatin      Leucovorin      Fluorouracil      Fluorouracil   **Always confirm dose/schedule in your pharmacy ordering system**  REASON: Disease Progression PRIOR TREATMENT: GEOS36: Nivolumab 240 mg + mFOLFOX6 q14 Days Until Progression, Unacceptable Toxicity, or up to 24 Months TREATMENT RESPONSE: Progressive Disease (PD)  START ON PATHWAY REGIMEN - Gastroesophageal     A cycle is every 28 days:     Ramucirumab      Paclitaxel   **Always confirm dose/schedule in your pharmacy ordering system**  Patient Characteristics: Distant Metastases (cM1/pM1) / Locally Recurrent Disease, Adenocarcinoma - Esophageal, GE Junction, and Gastric, Second Line, MSS/pMMR or MSI Unknown Therapeutic Status: Distant Metastases (No Additional Staging) Histology: Adenocarcinoma Disease Classification: Esophageal Line of Therapy: Second Line Microsatellite/Mismatch Repair Status: MSS/pMMR Intent of Therapy: Non-Curative / Palliative Intent, Discussed with Patient

## 2023-03-25 ENCOUNTER — Encounter: Payer: Self-pay | Admitting: Oncology

## 2023-03-26 ENCOUNTER — Inpatient Hospital Stay: Payer: No Typology Code available for payment source

## 2023-03-26 ENCOUNTER — Inpatient Hospital Stay: Payer: No Typology Code available for payment source | Admitting: Oncology

## 2023-03-26 ENCOUNTER — Ambulatory Visit: Payer: No Typology Code available for payment source | Admitting: Oncology

## 2023-03-26 ENCOUNTER — Other Ambulatory Visit: Payer: No Typology Code available for payment source

## 2023-03-26 ENCOUNTER — Ambulatory Visit: Payer: No Typology Code available for payment source

## 2023-03-26 ENCOUNTER — Encounter: Payer: Self-pay | Admitting: Oncology

## 2023-03-26 MED FILL — Dexamethasone Sodium Phosphate Inj 100 MG/10ML: INTRAMUSCULAR | Qty: 1 | Status: AC

## 2023-03-27 ENCOUNTER — Inpatient Hospital Stay: Payer: No Typology Code available for payment source

## 2023-03-27 ENCOUNTER — Inpatient Hospital Stay (HOSPITAL_BASED_OUTPATIENT_CLINIC_OR_DEPARTMENT_OTHER): Payer: No Typology Code available for payment source | Admitting: Oncology

## 2023-03-27 ENCOUNTER — Encounter: Payer: Self-pay | Admitting: Oncology

## 2023-03-27 ENCOUNTER — Other Ambulatory Visit: Payer: Self-pay

## 2023-03-27 VITALS — BP 130/82 | HR 80 | Temp 97.4°F | Resp 18

## 2023-03-27 VITALS — BP 113/71 | HR 86 | Temp 97.1°F | Resp 18 | Wt 216.2 lb

## 2023-03-27 DIAGNOSIS — T451X5A Adverse effect of antineoplastic and immunosuppressive drugs, initial encounter: Secondary | ICD-10-CM | POA: Diagnosis not present

## 2023-03-27 DIAGNOSIS — E876 Hypokalemia: Secondary | ICD-10-CM | POA: Diagnosis not present

## 2023-03-27 DIAGNOSIS — C159 Malignant neoplasm of esophagus, unspecified: Secondary | ICD-10-CM | POA: Diagnosis not present

## 2023-03-27 DIAGNOSIS — I82621 Acute embolism and thrombosis of deep veins of right upper extremity: Secondary | ICD-10-CM

## 2023-03-27 DIAGNOSIS — Z5111 Encounter for antineoplastic chemotherapy: Secondary | ICD-10-CM | POA: Diagnosis not present

## 2023-03-27 DIAGNOSIS — D500 Iron deficiency anemia secondary to blood loss (chronic): Secondary | ICD-10-CM | POA: Diagnosis not present

## 2023-03-27 DIAGNOSIS — D509 Iron deficiency anemia, unspecified: Secondary | ICD-10-CM | POA: Insufficient documentation

## 2023-03-27 DIAGNOSIS — G620 Drug-induced polyneuropathy: Secondary | ICD-10-CM

## 2023-03-27 DIAGNOSIS — Z5112 Encounter for antineoplastic immunotherapy: Secondary | ICD-10-CM | POA: Diagnosis not present

## 2023-03-27 DIAGNOSIS — J984 Other disorders of lung: Secondary | ICD-10-CM | POA: Diagnosis not present

## 2023-03-27 LAB — CMP (CANCER CENTER ONLY)
ALT: 19 U/L (ref 0–44)
AST: 24 U/L (ref 15–41)
Albumin: 3.1 g/dL — ABNORMAL LOW (ref 3.5–5.0)
Alkaline Phosphatase: 83 U/L (ref 38–126)
Anion gap: 8 (ref 5–15)
BUN: 11 mg/dL (ref 8–23)
CO2: 25 mmol/L (ref 22–32)
Calcium: 9.1 mg/dL (ref 8.9–10.3)
Chloride: 108 mmol/L (ref 98–111)
Creatinine: 0.89 mg/dL (ref 0.61–1.24)
GFR, Estimated: >60 mL/min (ref >60)
Glucose, Bld: 144 mg/dL — ABNORMAL HIGH (ref 70–99)
Potassium: 3.2 mmol/L — ABNORMAL LOW (ref 3.5–5.1)
Sodium: 141 mmol/L (ref 135–145)
Total Bilirubin: 0.3 mg/dL (ref 0.3–1.2)
Total Protein: 7.2 g/dL (ref 6.5–8.1)

## 2023-03-27 LAB — CBC WITH DIFFERENTIAL (CANCER CENTER ONLY)
Abs Immature Granulocytes: 0.03 10*3/uL (ref 0.00–0.07)
Basophils Absolute: 0.1 10*3/uL (ref 0.0–0.1)
Basophils Relative: 1 %
Eosinophils Absolute: 0 10*3/uL (ref 0.0–0.5)
Eosinophils Relative: 0 %
HCT: 30.9 % — ABNORMAL LOW (ref 39.0–52.0)
Hemoglobin: 9.4 g/dL — ABNORMAL LOW (ref 13.0–17.0)
Immature Granulocytes: 0 %
Lymphocytes Relative: 15 %
Lymphs Abs: 1.2 10*3/uL (ref 0.7–4.0)
MCH: 26.3 pg (ref 26.0–34.0)
MCHC: 30.4 g/dL (ref 30.0–36.0)
MCV: 86.6 fL (ref 80.0–100.0)
Monocytes Absolute: 0.9 10*3/uL (ref 0.1–1.0)
Monocytes Relative: 11 %
Neutro Abs: 6 10*3/uL (ref 1.7–7.7)
Neutrophils Relative %: 73 %
Platelet Count: 285 10*3/uL (ref 150–400)
RBC: 3.57 MIL/uL — ABNORMAL LOW (ref 4.22–5.81)
RDW: 15.6 % — ABNORMAL HIGH (ref 11.5–15.5)
WBC Count: 8.1 10*3/uL (ref 4.0–10.5)
nRBC: 0 % (ref 0.0–0.2)

## 2023-03-27 LAB — RETIC PANEL
Immature Retic Fract: 22.9 % — ABNORMAL HIGH (ref 2.3–15.9)
RBC.: 3.55 MIL/uL — ABNORMAL LOW (ref 4.22–5.81)
Retic Count, Absolute: 68.2 10*3/uL (ref 19.0–186.0)
Retic Ct Pct: 1.9 % (ref 0.4–3.1)
Reticulocyte Hemoglobin: 23.4 pg — ABNORMAL LOW (ref >27.9)

## 2023-03-27 LAB — IRON AND TIBC
Iron: 38 ug/dL — ABNORMAL LOW (ref 45–182)
Saturation Ratios: 13 % — ABNORMAL LOW (ref 17.9–39.5)
TIBC: 283 ug/dL (ref 250–450)
UIBC: 245 ug/dL

## 2023-03-27 LAB — PROTEIN, URINE, RANDOM: Total Protein, Urine: 17 mg/dL

## 2023-03-27 LAB — FERRITIN: Ferritin: 489 ng/mL — ABNORMAL HIGH (ref 24–336)

## 2023-03-27 MED ORDER — SODIUM CHLORIDE 0.9 % IV SOLN
80.0000 mg/m2 | Freq: Once | INTRAVENOUS | Status: AC
  Administered 2023-03-27: 174 mg via INTRAVENOUS
  Filled 2023-03-27: qty 29

## 2023-03-27 MED ORDER — DIPHENHYDRAMINE HCL 50 MG/ML IJ SOLN
50.0000 mg | Freq: Once | INTRAMUSCULAR | Status: AC
  Administered 2023-03-27: 50 mg via INTRAVENOUS
  Filled 2023-03-27: qty 1

## 2023-03-27 MED ORDER — SODIUM CHLORIDE 0.9 % IV SOLN
8.0000 mg/kg | Freq: Once | INTRAVENOUS | Status: AC
  Administered 2023-03-27: 800 mg via INTRAVENOUS
  Filled 2023-03-27: qty 50

## 2023-03-27 MED ORDER — SODIUM CHLORIDE 0.9 % IV SOLN
Freq: Once | INTRAVENOUS | Status: AC
  Filled 2023-03-27: qty 250

## 2023-03-27 MED ORDER — HEPARIN SOD (PORK) LOCK FLUSH 100 UNIT/ML IV SOLN
500.0000 [IU] | Freq: Once | INTRAVENOUS | Status: AC | PRN
  Administered 2023-03-27: 500 [IU]
  Filled 2023-03-27: qty 5

## 2023-03-27 MED ORDER — FAMOTIDINE IN NACL 20-0.9 MG/50ML-% IV SOLN
20.0000 mg | Freq: Once | INTRAVENOUS | Status: AC
  Administered 2023-03-27: 20 mg via INTRAVENOUS
  Filled 2023-03-27: qty 50

## 2023-03-27 MED ORDER — SODIUM CHLORIDE 0.9 % IV SOLN
10.0000 mg | Freq: Once | INTRAVENOUS | Status: AC
  Administered 2023-03-27: 10 mg via INTRAVENOUS
  Filled 2023-03-27: qty 10

## 2023-03-27 MED ORDER — IRON-VITAMIN C 65-125 MG PO TABS: 1.0000 | ORAL_TABLET | Freq: Every day | ORAL | 0 refills | Status: DC

## 2023-03-27 NOTE — Assessment & Plan Note (Signed)
Due to radiation/immunotherapy.  Recommend prednisone 60mg  daily, will taper down once his symptoms are controlled.

## 2023-03-27 NOTE — Assessment & Plan Note (Signed)
Continue Eliquis 5mg BID 

## 2023-03-27 NOTE — Assessment & Plan Note (Addendum)
Grade 2, he prefers to hold of neuropathy treatments.  observation.

## 2023-03-27 NOTE — Assessment & Plan Note (Addendum)
Stage IV esophageal adenocarcinoma, liver metastatic disease I recommend systematic palliative chemotherapy with FOLFOX [Oxaliplatin 75mg /m2 while on RT] .  Labs are reviewed and discussed with patient. PET showed progression.  proceed with 5-FU nivolumab Q2 weeks.   CEA continues to rise.  Repeat CT chest abdomen pelvis w contrast - progression.  I recommend to switch to next line treatment with Taxol and Cyramza  Rationale and side effects were reviewed with patient.   Labs are reviewed and discussed with patient. Proceed with C1D1 Taxol and cyramza

## 2023-03-27 NOTE — Assessment & Plan Note (Signed)
Chronic hypokalemia Cotinue KCL to BID. Marland Kitchen

## 2023-03-27 NOTE — Progress Notes (Signed)
Pt here for new treatment. Reports that his appetite is getting better. He continues to have a cough, worse when he talks.

## 2023-03-27 NOTE — Assessment & Plan Note (Signed)
Lab Results  Component Value Date   HGB 9.4 (L) 03/27/2023   TIBC 283 03/27/2023   IRONPCTSAT 13 (L) 03/27/2023   FERRITIN 489 (H) 03/27/2023  Reticulocyte hemoglobin is decreased at 23. Ferritin maybe falsely elevated . Anemia is likely due to chemotherapy and possible iron deficiency.  Recommend trial of oral iron supplementation.

## 2023-03-27 NOTE — Progress Notes (Signed)
Hematology/Oncology Progress note [phone] [phone]        REFERRING PROVIDER: Rickard Patience, MD  CHIEF COMPLAINTS/PURPOSE OF CONSULTATION:  Stage IV Esophageal adenocarcinoma  ASSESSMENT & PLAN:   Cancer Staging  Adenocarcinoma of esophagus [facility]) Staging form: Esophagus - Adenocarcinoma, AJCC 8th Edition - Clinical stage from 08/28/2022: Stage IVB (cT3, cN1, cM1) - Signed by Rickard Patience, MD on 10/04/2022   Adenocarcinoma of esophagus (HCC) Stage IV esophageal adenocarcinoma, liver metastatic disease I recommend systematic palliative chemotherapy with FOLFOX [Oxaliplatin 75mg /m2 while on RT] .  Labs are reviewed and discussed with patient. PET showed progression.  proceed with 5-FU nivolumab Q2 weeks.   CEA continues to rise.  Repeat CT chest abdomen pelvis w contrast - progression.  I recommend to switch to next line treatment with Taxol and Cyramza  Rationale and side effects were reviewed with patient.   Labs are reviewed and discussed with patient. Proceed with C1D1 Taxol and cyramza  Hypokalemia Chronic hypokalemia Cotinue KCL to BID. Marland Kitchen      Encounter for antineoplastic chemotherapy Chemotherapy plan as listed above.   Deep venous thrombosis (HCC) Continue Eliquis 5mg  BID   Chemotherapy-induced neuropathy (HCC) Grade 2, he prefers to hold of neuropathy treatments.  observation.   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  Recommend prednisone 60mg  daily, will taper down once his symptoms are controlled.      Orders Placed This Encounter  Procedures   Ferritin    Standing Status:   Future    Number of Occurrences:   1    Standing Expiration Date:   03/26/2024   Iron and TIBC    Standing Status:   Future    Number of Occurrences:   1    Standing Expiration Date:   03/26/2024   Retic Panel    Standing Status:   Future    Number of Occurrences:   1    Standing Expiration Date:   03/26/2024    Follow-up 1 week  All  questions were answered. The patient knows to call the clinic with any problems, questions or concerns.  Rickard Patience, MD, PhD [facility] Hematology Oncology 03/27/2023   HISTORY OF PRESENTING ILLNESS:  Jonathan [name] 67 y.o. male presents to establish care for esophageal adenocarcinoma I have reviewed his chart and materials related to his cancer extensively and collaborated history with the patient. Summary of oncologic history is as follows: Oncology History  Adenocarcinoma of esophagus (HCC)  08/28/2022 Initial Diagnosis   Adenocarcinoma of esophagus   -Patient has noticed worsening of "food stuck/fullness" sensation since November 2023.  Patient had a barium swallow study which commented on marked mucosal irregularity in the distal esophagus with Broaddus base mural filling defect highly suspicious for malignancy.  Patient establish care with gastroenterology. -08/23/2022, EGD showed gastritis and partially obstructing malignant esophageal tumor in the lower third of the esophagus. Esophagus mass biopsy showed adenocarcinoma.  PD-L1 TPS 65%, HER2 negative.  [facility] NGS showed KRASG12V, CDKN2A, ARID1A, TP53, TMB 5.3, MSI stable.  Stomach biopsy showed gastric mucosa with no specific histology abnormality.  No significant intestinal metaplastic, dysplastic, granular atrophy or increased inflammation.     08/28/2022 Imaging   CT chest abdomen pelvis with contrast showed 1. Distal esophageal primary with gastrohepatic ligament nodal metastasis. 2. 2 right-sided pulmonary nodules, the largest of which measures 5 mm and is new since 2015. Pulmonary metastasis not be excluded. 3. Anterior right lower lobe volume loss and minimal soft tissue density, favoring atelectasis or  scar. Recommend attention on follow-up. 4. Hepatic steatosis 5. Cholelithiasis 6. Left nephrolithiasis 7. Coronary artery atherosclerosis. Aortic Atherosclerosis   08/28/2022 Cancer Staging   Staging form: Esophagus -  Adenocarcinoma, AJCC 8th Edition - Clinical stage from 08/28/2022: Stage IVB (cT3, cN1, cM1) - Signed by Rickard Patience, MD on 10/04/2022 Stage prefix: Initial diagnosis   09/06/2022 Imaging   PET scan showed 1. Esophageal primary with gastrohepatic ligament nodal metastasis,as on CT. 2. Focus of hypermetabolism which is favored to registered to the posterior hepatic dome, in the region of subtle heterogeneity on prior diagnostic CT. Suboptimally evaluated secondary to underlying steatosis. Recommend further evaluation with pre and post contrast abdominal MRI to confirm probable metastasis. 3. Incidental findings, including: Left nephrolithiasis.Cholelithiasis. Coronary artery atherosclerosis. Aortic Atherosclerosis    09/14/2022 - 09/14/2022 Chemotherapy   Patient is on Treatment Plan : ESOPHAGUS Carboplatin + Paclitaxel Weekly X 6 Weeks with XRT     09/23/2022 Imaging   MRI abdomen with and without contrast showed 1. Mildly T2 hyperintense segment VII hepatic lesion measuring 2.7 cm with imaging characteristics compatible with metastatic disease. 2. Tiny focus of delayed enhancement in the inferior right lobe of the liver segment VI measuring 8 mm with ill-defined increased T2 signal and subtle corresponding reduced diffusivity, also suspicious for metastatic disease. 3. Partially visualized distal esophageal wall thickening compatible with the patient's known primary neoplasm. 4. Similar size of the 11 mm gastrohepatic ligament lymph node mildly metabolic on prior PET-CT and compatible with local nodal disease involvement. 5. 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 Recommend follow up pre and post-contrast MRI/MRCP in 1 year. 6. Diffuse hepatic steatosis.   10/10/2022 Procedure   LIVER MASS; CT-GUIDED BIOPSY:  - MODERATE TO POORLY DIFFERENTIATED ADENOCARCINOMA MORPHOLOGICALLY  CONSISTENT WITH METASTASIS FROM  PATIENT'S KNOWN ESOPHAGEAL  ADENOCARCINOMA.    10/17/2022 Procedure   Medi port placed by Dr. Wyn Quaker   10/21/2022 - 01/15/2023 Chemotherapy   Patient is on Treatment Plan : ESOPHAGEAL ADENOCARCINOMA FOLFOX q14d x 6 cycles     01/15/2023 Imaging   PET showed  1. Interval progression in metastatic esophageal carcinoma as evidenced by hypermetabolic lymph nodes in the neck, chest, abdomen and pelvis, an enlarging right hepatic lobe metastasis, new bilateral adrenal metastases and new osseous metastases. 2. Cholelithiasis. 3. Left renal stones. 4. Aortic atherosclerosis (ICD10-I70.0). Coronary artery calcification.    01/27/2023 - 03/14/2023 Chemotherapy   Patient is on Treatment Plan : GASTROESOPHAGEAL FOLFOX + Nivolumab q14d     03/20/2023 Imaging   CT chest abdomen pelvis w contrast showed 1. Interval progression of metastatic disease with multiple new and enlarged hypodense lesions throughout the liver. 2. Significant enlargement of bilateral adrenal metastases. 3. Subtle sclerosis of an osseous metastasis of the right femoral neck. Other previously FDG avid osseous metastases of the left femoral neck and right sixth rib not appreciated by CT. 4. No persistently enlarged lymph nodes. 5. Interval development of bandlike consolidation and fibrosis of the paramedian right lung, particularly of the right lower lobe, as well as additional scattered irregular opacities throughout the right lung. 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 6. Similar circumferential wall thickening throughout the mid to lower esophagus, consistent with known primary esophageal adenocarcinoma. 7. Nonobstructive bilateral nephrolithiasis. Aortic Atherosclerosis   03/27/2023 -  Chemotherapy   Patient is on Treatment Plan : GASTROESOPHAGEAL Ramucirumab D1, 15 + Paclitaxel D1,8,15 q28d  Patient presents to establish care.  He is not taking PPI. He has intentionally lost  some weight. Family history positive for father and paternal uncle with prostate cancer and sister with breast cancer. Denies any routine alcohol use  Right interval jugular vein occlusive DVT, started on Eliquis starter package on 10/28/2022, swelling of neck has improved.   CXR showed Interval development of bilateral perihilar interstitial opacities  He was treated with Azithromycin and Doxycycline to cover atypical infections.   INTERVAL HISTORY Jonathan [name] is a 67 y.o. male who has above history reviewed by me today presents for follow up visit for Stage IV esophageal adenocarcinoma.  He started on prednisone 60mg  yesterday, cough has improved. Feels better.  He denies any SOB, abdominal pain.  He has lost weight.   MEDICAL HISTORY:  Past Medical History:  Diagnosis Date   Arthritis    Complication of anesthesia    OCCURRED ONCE YEARS AGO 1981   Esophageal mass    Hypertension    Hypothyroidism    PONV (postoperative nausea and vomiting)     SURGICAL HISTORY: Past Surgical History:  Procedure Laterality Date   COLONOSCOPY     ESOPHAGOGASTRODUODENOSCOPY N/A 08/23/2022   Procedure: ESOPHAGOGASTRODUODENOSCOPY (EGD);  Surgeon: Jaynie Collins, DO;  Location: [facility] ENDOSCOPY;  Service: Gastroenterology;  Laterality: N/A;   EUS N/A 09/12/2022   Procedure: FULL UPPER ENDOSCOPIC ULTRASOUND (EUS) RADIAL;  Surgeon: Bearl Mulberry, MD;  Location: [facility] ENDOSCOPY;  Service: Gastroenterology;  Laterality: N/A;   FINGER SURGERY     PORTA CATH INSERTION N/A 10/17/2022   Procedure: PORTA CATH INSERTION;  Surgeon: Annice Needy, MD;  Location: [facility] INVASIVE CV LAB;  Service: Cardiovascular;  Laterality: N/A;   TENDON REPAIR IN LEFT KNEE      SOCIAL HISTORY: Social History   Socioeconomic History   Marital status: Single    Spouse name: Not on file   Number of children: Not on file   Years of education: Not on file   Highest education level: Not on file  Occupational  History   Not on file  Tobacco Use   Smoking status: Never   Smokeless tobacco: Never  Vaping Use   Vaping status: Never Used  Substance and Sexual Activity   Alcohol use: Yes    Comment: OCCASIONALLY   Drug use: Never   Sexual activity: Not on file  Other Topics Concern   Not on file  Social History Narrative   Not on file   Social Determinants of Health   Financial Resource Strain: Low Risk  (10/29/2022)   Received from [facility], [facility]   Overall Financial Resource Strain (CARDIA)    Difficulty of Paying Living Expenses: Not hard at all  Food Insecurity: No Food Insecurity (10/29/2022)   Received from [facility], [facility]   Hunger Vital Sign    Worried About Running Out of Food in the Last Year: Never true    Ran Out of Food in the Last Year: Never true  Transportation Needs: No Transportation Needs (10/29/2022)   Received from [facility], [facility]   [facility] - Transportation    In the past 12 months, has lack of transportation kept you from medical appointments or from getting medications?: No    Lack of Transportation (Non-Medical): No  Physical Activity: Not on file  Stress: Not on file  Social Connections: Not on file  Intimate Partner Violence:  Not At Risk (08/28/2022)   Humiliation, Afraid, Rape, and Kick questionnaire    Fear of Current or Ex-Partner: No    Emotionally Abused: No    Physically Abused: No    Sexually Abused: No    FAMILY HISTORY: Family History  Problem Relation Age of Onset   Heart attack Mother    Prostate cancer Father    Breast cancer Sister    Prostate cancer Paternal Uncle     ALLERGIES:  has No Known Allergies.  MEDICATIONS:  Current Outpatient Medications  Medication Sig Dispense Refill   acetaminophen (TYLENOL) 650 MG CR tablet Take 1,300 mg by mouth every 8 (eight) hours as needed for pain.     amLODipine  (NORVASC) 10 MG tablet Take 10 mg by mouth daily.     atenolol (TENORMIN) 50 MG tablet Take 50 mg by mouth daily.     azelastine (ASTELIN) 0.1 % nasal spray Place 1 spray into both nostrils 2 (two) times daily. Use in each nostril as directed 30 mL 1   benzonatate (TESSALON) 200 MG capsule Take 1 capsule (200 mg total) by mouth 3 (three) times daily as needed for cough. 20 capsule 5   chlorhexidine (PERIDEX) 0.12 % solution Use as directed 15 mLs in the mouth or throat 2 (two) times daily. 473 mL 1   Cholecalciferol (VITAMIN D3) 125 MCG (5000 UT) CAPS Take 5,000 Units by mouth daily.     ELIQUIS 5 MG TABS tablet Take 1 tablet by mouth twice daily 60 tablet 0   hydrochlorothiazide (HYDRODIURIL) 25 MG tablet Take 25 mg by mouth daily.     hydrocortisone-nystatin-lidocaine in diphenhydrAMINE liquid Swish and swallow 5 mLs by mouth 3 (three) times daily as needed for mouth pain. 200 mL 2   levothyroxine (SYNTHROID) 125 MCG tablet Take 125 mcg by mouth every morning.     Multiple Vitamins-Minerals (MULTIVITAMIN WITH MINERALS) tablet Take 1 tablet by mouth daily.     omeprazole (PRILOSEC) 20 MG capsule Take 20 mg by mouth daily.     ondansetron (ZOFRAN) 8 MG tablet Take 1 tablet (8 mg total) by mouth every 8 (eight) hours as needed for nausea or vomiting. Start on the third day after chemotherapy.     potassium chloride SA (KLOR-CON M) 20 MEQ tablet Take 2 tablets (40 mEq total) by mouth 2 (two) times daily. 120 tablet 0   predniSONE (DELTASONE) 20 MG tablet Take 3 tablets (60 mg total) by mouth daily with breakfast. 60 tablet 0   prochlorperazine (COMPAZINE) 10 MG tablet Take 1 tablet (10 mg total) by mouth every 6 (six) hours as needed for nausea or vomiting.     cetirizine (ZYRTEC ALLERGY) 10 MG tablet Take 1 tablet (10 mg total) by mouth at bedtime. (Patient not taking: Reported on 03/27/2023) 30 tablet 11   No current facility-administered medications for this visit.   Facility-Administered  Medications Ordered in Other Visits  Medication Dose Route Frequency Provider Last Rate Last Admin   heparin lock flush 100 unit/mL  500 Units Intracatheter Once PRN Rickard Patience, MD       PACLitaxel (TAXOL) 174 mg in sodium chloride 0.9 % 250 mL chemo infusion (</= 80mg /m2)  80 mg/m2 (Treatment Plan Recorded) Intravenous Once Rickard Patience, MD       ramucirumab [facility]) 800 mg in sodium chloride 0.9 % 170 mL chemo infusion  8 mg/kg (Treatment Plan Recorded) Intravenous Once Rickard Patience, MD        Review of Systems  Constitutional:  Negative for appetite change, chills, fatigue and fever.  HENT:   Negative for hearing loss and voice change.        Nasal congestion, postnasal drip.   Eyes:  Negative for eye problems and icterus.  Respiratory:  Negative for chest tightness, cough and shortness of breath.   Cardiovascular:  Negative for chest pain and leg swelling.  Gastrointestinal:  Negative for abdominal distention, abdominal pain, nausea and vomiting.  Endocrine: Negative for hot flashes.  Genitourinary:  Negative for difficulty urinating, dysuria and frequency.   Musculoskeletal:  Negative for arthralgias.  Skin:  Negative for itching and rash.  Neurological:  Negative for light-headedness and numbness.  Hematological:  Negative for adenopathy. Does not bruise/bleed easily.  Psychiatric/Behavioral:  Negative for confusion.      PHYSICAL EXAMINATION: ECOG PERFORMANCE STATUS: 0 - Asymptomatic  Vitals:   03/27/23 0836  BP: 113/71  Pulse: 86  Resp: 18  Temp: (!) 97.1 F (36.2 C)  SpO2: 98%   Filed Weights   03/27/23 0836  Weight: 216 lb 3.2 oz (98.1 kg)    Physical Exam Constitutional:      General: He is not in acute distress.    Appearance: He is obese. He is not diaphoretic.  HENT:     Head: Normocephalic and atraumatic.  Eyes:     General: No scleral icterus.    Pupils: Pupils are equal, round, and reactive to light.  Cardiovascular:     Rate and Rhythm: Normal rate and  regular rhythm.     Heart sounds: No murmur heard. Pulmonary:     Effort: Pulmonary effort is normal. No respiratory distress.     Breath sounds: No rales.  Chest:     Chest wall: No tenderness.  Abdominal:     General: There is no distension.     Palpations: Abdomen is soft.     Tenderness: There is no abdominal tenderness.  Musculoskeletal:        General: Normal range of motion.     Cervical back: Normal range of motion and neck supple.  Skin:    General: Skin is warm and dry.     Findings: No erythema.  Neurological:     Mental Status: He is alert and oriented to person, place, and time. Mental status is at baseline.     Cranial Nerves: No cranial nerve deficit.     Motor: No abnormal muscle tone.     Coordination: Coordination normal.  Psychiatric:        Mood and Affect: Mood and affect normal.      LABORATORY DATA:  I have reviewed the data as listed    Latest Ref Rng & Units 03/27/2023    8:09 AM 03/12/2023    8:02 AM 02/26/2023    7:59 AM  CBC  WBC 4.0 - 10.5 K/uL 8.1  5.2  5.0   Hemoglobin 13.0 - 17.0 g/dL 9.4  16.1  09.6   Hematocrit 39.0 - 52.0 % 30.9  33.2  32.5   Platelets 150 - 400 K/uL 285  262  282       Latest Ref Rng & Units 03/27/2023    8:09 AM 03/12/2023    8:02 AM 02/26/2023    7:59 AM  CMP  Glucose 70 - 99 mg/dL 045  409  811   BUN 8 - 23 mg/dL 11  10  12    Creatinine 0.61 - 1.24 mg/dL 9.14  7.82  9.56   Sodium  135 - 145 mmol/L 141  137  138   Potassium 3.5 - 5.1 mmol/L 3.2  3.0  3.1   Chloride 98 - 111 mmol/L 108  105  106   CO2 22 - 32 mmol/L 25  25  23    Calcium 8.9 - 10.3 mg/dL 9.1  9.1  9.2   Total Protein 6.5 - 8.1 g/dL 7.2  7.2  7.1   Total Bilirubin 0.3 - 1.2 mg/dL 0.3  0.5  0.7   Alkaline Phos 38 - 126 U/L 83  75  66   AST 15 - 41 U/L 24  25  27    ALT 0 - 44 U/L 19  22  23       RADIOGRAPHIC STUDIES: I have personally reviewed the radiological images as listed and agreed with the findings in the report. CT CHEST ABDOMEN PELVIS  W CONTRAST  Result Date: 03/20/2023 CLINICAL DATA:  Esophageal adenocarcinoma restaging * Tracking Code: BO * EXAM: CT CHEST, ABDOMEN, AND PELVIS WITH CONTRAST TECHNIQUE: Multidetector CT imaging of the chest, abdomen and pelvis was performed following the standard protocol during bolus administration of intravenous contrast. RADIATION DOSE REDUCTION: This exam was performed according to the departmental dose-optimization program which includes automated exposure control, adjustment of the mA and/or kV according to patient size and/or use of iterative reconstruction technique. CONTRAST:  OMNIPAQUE IOHEXOL 300 MG/ML  SOLN COMPARISON:  PET-CT, 01/08/2023 FINDINGS: CT CHEST FINDINGS Cardiovascular: Right chest port catheter. Scattered aortic atherosclerosis. Normal heart size. No pericardial effusion. Mediastinum/Nodes: No enlarged mediastinal, hilar, or axillary lymph nodes. Similar circumferential wall thickening throughout the mid to lower esophagus (series 2, image 78). Thyroid gland and trachea demonstrate no significant findings. Lungs/Pleura: Interval development of bandlike consolidation and fibrosis of the paramedian right lung, particularly of the right lower lobe (series 4, image 89), as well as additional scattered irregular opacities throughout the right lung. No pleural effusion or pneumothorax. Musculoskeletal: No chest wall abnormality. No acute osseous findings. CT ABDOMEN PELVIS FINDINGS Hepatobiliary: Multiple new and enlarged hypodense lesions throughout the liver, largest again in the posterior liver dome, centered in hepatic segment VII, measuring 9.3 x 6.2 cm, previously 4.8 x 4.7 cm (series 2, image 47). Example new lesion anteriorly in hepatic segment VIII measures 4.4 x 3.8 cm (series 2, image 44). No gallstones, gallbladder wall thickening, or biliary dilatation. Pancreas: Unremarkable. No pancreatic ductal dilatation or surrounding inflammatory changes. Spleen: Normal in size without  significant abnormality. Adrenals/Urinary Tract: Significant enlargement of a right adrenal metastasis, measuring 5.9 x 4.4 cm, previously 2.5 x 2.3 cm (series 2, image 57). Interval enlargement of a left adrenal metastasis, measuring 2.2 x 1.5 cm (series 2, image 65). Small nonobstructive bilateral renal calculi. No hydronephrosis. Bladder is unremarkable. Stomach/Bowel: Stomach is within normal limits. Appendix appears normal. No evidence of bowel wall thickening, distention, or inflammatory changes. Sigmoid diverticulosis. Vascular/Lymphatic: Scattered aortic atherosclerosis. No persistently enlarged abdominal or pelvic lymph nodes. Reproductive: Prostatomegaly. Other: No abdominal wall hernia or abnormality. No ascites. Musculoskeletal: No acute osseous findings. Subtle sclerosis of an osseous metastasis of the right femoral neck (series 2, image 116). IMPRESSION: 1. Interval progression of metastatic disease with multiple new and enlarged hypodense lesions throughout the liver. 2. Significant enlargement of bilateral adrenal metastases. 3. Subtle sclerosis of an osseous metastasis of the right femoral neck. Other previously FDG avid osseous metastases of the left femoral neck and right sixth rib not appreciated by CT. 4. No persistently enlarged lymph nodes. 5. Interval development  of bandlike consolidation and fibrosis of the paramedian right lung, particularly of the right lower lobe, as well as additional scattered irregular opacities throughout the right lung. 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 6. Similar circumferential wall thickening throughout the mid to lower esophagus, consistent with known primary esophageal adenocarcinoma. 7. Nonobstructive bilateral nephrolithiasis. Aortic Atherosclerosis (ICD10-I70.0). Electronically Signed   By: Jearld Lesch M.D.   On: 03/20/2023 19:31

## 2023-03-27 NOTE — Patient Instructions (Signed)
[facility]  Discharge Instructions: Thank you for choosing [facility] to provide your oncology and hematology care.  If you have a lab appointment with the Cancer Center, please go directly to the Cancer Center and check in at the registration area.  Wear comfortable clothing and clothing appropriate for easy access to any Portacath or PICC line.   We strive to give you quality time with your provider. You may need to reschedule your appointment if you arrive late (15 or more minutes).  Arriving late affects you and other patients whose appointments are after yours.  Also, if you miss three or more appointments without notifying the office, you may be dismissed from the clinic at the provider's discretion.      For prescription refill requests, have your pharmacy contact our office and allow 72 hours for refills to be completed.    Today you received the following chemotherapy and/or immunotherapy agents Cyramza and taxol    To help prevent nausea and vomiting after your treatment, we encourage you to take your nausea medication as directed.  BELOW ARE SYMPTOMS THAT SHOULD BE REPORTED IMMEDIATELY: *FEVER GREATER THAN 100.4 F (38 C) OR HIGHER *CHILLS OR SWEATING *NAUSEA AND VOMITING THAT IS NOT CONTROLLED WITH YOUR NAUSEA MEDICATION *UNUSUAL SHORTNESS OF BREATH *UNUSUAL BRUISING OR BLEEDING *URINARY PROBLEMS (pain or burning when urinating, or frequent urination) *BOWEL PROBLEMS (unusual diarrhea, constipation, pain near the anus) TENDERNESS IN MOUTH AND THROAT WITH OR WITHOUT PRESENCE OF ULCERS (sore throat, sores in mouth, or a toothache) UNUSUAL RASH, SWELLING OR PAIN  UNUSUAL VAGINAL DISCHARGE OR ITCHING   Items with * indicate a potential emergency and should be followed up as soon as possible or go to the Emergency Department if any problems should occur.  Please show the CHEMOTHERAPY ALERT CARD or IMMUNOTHERAPY ALERT CARD at  check-in to the Emergency Department and triage nurse.  Should you have questions after your visit or need to cancel or reschedule your appointment, please contact [facility]  [phone] and follow the prompts.  Office hours are 8:00 a.m. to 4:30 p.m. Monday - Friday. Please note that voicemails left after 4:00 p.m. may not be returned until the following business day.  We are closed weekends and major holidays. You have access to a nurse at all times for urgent questions. Please call the main number to the clinic [phone] and follow the prompts.  For any non-urgent questions, you may also contact your provider using MyChart. We now offer e-Visits for anyone 47 and older to request care online for non-urgent symptoms. For details visit [email or web address].   Also download the MyChart app! Go to the app store, search "MyChart", open the app, select [facility], and log in with your MyChart username and password.

## 2023-03-27 NOTE — Assessment & Plan Note (Signed)
Chemotherapy plan as listed above 

## 2023-03-28 ENCOUNTER — Inpatient Hospital Stay: Payer: No Typology Code available for payment source

## 2023-03-28 ENCOUNTER — Telehealth: Payer: Self-pay

## 2023-03-28 NOTE — Telephone Encounter (Signed)
Nutrition  Patient called saying nutrition appointment was scheduled for 9/5 but his infusion will be on 9/6. Asking if RD can see him during infusion.    RD cancelled 9/5 appointment and scheduled nutrition appointment on 9/6 during infusion.  Patient informed.    Butler Vegh B. Freida Busman, RD, LDN Registered Dietitian [phone]

## 2023-03-29 ENCOUNTER — Other Ambulatory Visit: Payer: Self-pay | Admitting: Oncology

## 2023-04-01 DIAGNOSIS — Z515 Encounter for palliative care: Secondary | ICD-10-CM | POA: Diagnosis not present

## 2023-04-01 DIAGNOSIS — C159 Malignant neoplasm of esophagus, unspecified: Secondary | ICD-10-CM | POA: Diagnosis not present

## 2023-04-01 DIAGNOSIS — Z008 Encounter for other general examination: Secondary | ICD-10-CM | POA: Diagnosis not present

## 2023-04-01 DIAGNOSIS — M9903 Segmental and somatic dysfunction of lumbar region: Secondary | ICD-10-CM | POA: Diagnosis not present

## 2023-04-01 DIAGNOSIS — M9902 Segmental and somatic dysfunction of thoracic region: Secondary | ICD-10-CM | POA: Diagnosis not present

## 2023-04-01 DIAGNOSIS — M62830 Muscle spasm of back: Secondary | ICD-10-CM | POA: Diagnosis not present

## 2023-04-01 DIAGNOSIS — C7800 Secondary malignant neoplasm of unspecified lung: Secondary | ICD-10-CM | POA: Diagnosis not present

## 2023-04-01 DIAGNOSIS — M9901 Segmental and somatic dysfunction of cervical region: Secondary | ICD-10-CM | POA: Diagnosis not present

## 2023-04-02 ENCOUNTER — Encounter: Payer: Self-pay | Admitting: Oncology

## 2023-04-03 ENCOUNTER — Other Ambulatory Visit: Payer: No Typology Code available for payment source

## 2023-04-03 ENCOUNTER — Inpatient Hospital Stay: Payer: No Typology Code available for payment source

## 2023-04-03 ENCOUNTER — Ambulatory Visit: Payer: No Typology Code available for payment source

## 2023-04-03 ENCOUNTER — Ambulatory Visit: Payer: No Typology Code available for payment source | Admitting: Oncology

## 2023-04-03 MED FILL — Dexamethasone Sodium Phosphate Inj 100 MG/10ML: INTRAMUSCULAR | Qty: 1 | Status: AC

## 2023-04-04 ENCOUNTER — Inpatient Hospital Stay: Payer: No Typology Code available for payment source

## 2023-04-04 ENCOUNTER — Encounter: Payer: Self-pay | Admitting: Oncology

## 2023-04-04 ENCOUNTER — Inpatient Hospital Stay: Payer: No Typology Code available for payment source | Attending: Oncology

## 2023-04-04 ENCOUNTER — Inpatient Hospital Stay (HOSPITAL_BASED_OUTPATIENT_CLINIC_OR_DEPARTMENT_OTHER): Payer: No Typology Code available for payment source | Attending: Oncology | Admitting: Oncology

## 2023-04-04 VITALS — BP 118/82 | HR 72 | Temp 98.3°F | Resp 18 | Wt 219.9 lb

## 2023-04-04 DIAGNOSIS — E876 Hypokalemia: Secondary | ICD-10-CM | POA: Insufficient documentation

## 2023-04-04 DIAGNOSIS — I82621 Acute embolism and thrombosis of deep veins of right upper extremity: Secondary | ICD-10-CM | POA: Diagnosis not present

## 2023-04-04 DIAGNOSIS — D6481 Anemia due to antineoplastic chemotherapy: Secondary | ICD-10-CM

## 2023-04-04 DIAGNOSIS — K8020 Calculus of gallbladder without cholecystitis without obstruction: Secondary | ICD-10-CM | POA: Insufficient documentation

## 2023-04-04 DIAGNOSIS — K2970 Gastritis, unspecified, without bleeding: Secondary | ICD-10-CM | POA: Diagnosis not present

## 2023-04-04 DIAGNOSIS — J8410 Pulmonary fibrosis, unspecified: Secondary | ICD-10-CM | POA: Diagnosis not present

## 2023-04-04 DIAGNOSIS — Z79633 Long term (current) use of mitotic inhibitor: Secondary | ICD-10-CM | POA: Diagnosis not present

## 2023-04-04 DIAGNOSIS — J984 Other disorders of lung: Secondary | ICD-10-CM

## 2023-04-04 DIAGNOSIS — C155 Malignant neoplasm of lower third of esophagus: Secondary | ICD-10-CM | POA: Insufficient documentation

## 2023-04-04 DIAGNOSIS — Z5111 Encounter for antineoplastic chemotherapy: Secondary | ICD-10-CM | POA: Diagnosis not present

## 2023-04-04 DIAGNOSIS — N400 Enlarged prostate without lower urinary tract symptoms: Secondary | ICD-10-CM | POA: Diagnosis not present

## 2023-04-04 DIAGNOSIS — T451X5A Adverse effect of antineoplastic and immunosuppressive drugs, initial encounter: Secondary | ICD-10-CM | POA: Insufficient documentation

## 2023-04-04 DIAGNOSIS — I700 Atherosclerosis of aorta: Secondary | ICD-10-CM | POA: Insufficient documentation

## 2023-04-04 DIAGNOSIS — N200 Calculus of kidney: Secondary | ICD-10-CM | POA: Insufficient documentation

## 2023-04-04 DIAGNOSIS — K5730 Diverticulosis of large intestine without perforation or abscess without bleeding: Secondary | ICD-10-CM | POA: Diagnosis not present

## 2023-04-04 DIAGNOSIS — Z79899 Other long term (current) drug therapy: Secondary | ICD-10-CM | POA: Diagnosis not present

## 2023-04-04 DIAGNOSIS — C787 Secondary malignant neoplasm of liver and intrahepatic bile duct: Secondary | ICD-10-CM | POA: Insufficient documentation

## 2023-04-04 DIAGNOSIS — Z803 Family history of malignant neoplasm of breast: Secondary | ICD-10-CM | POA: Insufficient documentation

## 2023-04-04 DIAGNOSIS — C7951 Secondary malignant neoplasm of bone: Secondary | ICD-10-CM | POA: Diagnosis not present

## 2023-04-04 DIAGNOSIS — G620 Drug-induced polyneuropathy: Secondary | ICD-10-CM

## 2023-04-04 DIAGNOSIS — Z7901 Long term (current) use of anticoagulants: Secondary | ICD-10-CM | POA: Diagnosis not present

## 2023-04-04 DIAGNOSIS — C7972 Secondary malignant neoplasm of left adrenal gland: Secondary | ICD-10-CM | POA: Insufficient documentation

## 2023-04-04 DIAGNOSIS — I2510 Atherosclerotic heart disease of native coronary artery without angina pectoris: Secondary | ICD-10-CM | POA: Diagnosis not present

## 2023-04-04 DIAGNOSIS — C159 Malignant neoplasm of esophagus, unspecified: Secondary | ICD-10-CM | POA: Diagnosis not present

## 2023-04-04 DIAGNOSIS — K760 Fatty (change of) liver, not elsewhere classified: Secondary | ICD-10-CM | POA: Insufficient documentation

## 2023-04-04 DIAGNOSIS — Z8042 Family history of malignant neoplasm of prostate: Secondary | ICD-10-CM | POA: Insufficient documentation

## 2023-04-04 DIAGNOSIS — C7971 Secondary malignant neoplasm of right adrenal gland: Secondary | ICD-10-CM | POA: Insufficient documentation

## 2023-04-04 DIAGNOSIS — Z5112 Encounter for antineoplastic immunotherapy: Secondary | ICD-10-CM | POA: Insufficient documentation

## 2023-04-04 DIAGNOSIS — Z8249 Family history of ischemic heart disease and other diseases of the circulatory system: Secondary | ICD-10-CM | POA: Insufficient documentation

## 2023-04-04 DIAGNOSIS — Z7952 Long term (current) use of systemic steroids: Secondary | ICD-10-CM | POA: Insufficient documentation

## 2023-04-04 DIAGNOSIS — D509 Iron deficiency anemia, unspecified: Secondary | ICD-10-CM | POA: Diagnosis not present

## 2023-04-04 DIAGNOSIS — Z86718 Personal history of other venous thrombosis and embolism: Secondary | ICD-10-CM | POA: Insufficient documentation

## 2023-04-04 DIAGNOSIS — D500 Iron deficiency anemia secondary to blood loss (chronic): Secondary | ICD-10-CM

## 2023-04-04 LAB — RETIC PANEL
Immature Retic Fract: 42.7 % — ABNORMAL HIGH (ref 2.3–15.9)
RBC.: 3.76 MIL/uL — ABNORMAL LOW (ref 4.22–5.81)
Retic Count, Absolute: 68.8 10*3/uL (ref 19.0–186.0)
Retic Ct Pct: 1.8 % (ref 0.4–3.1)
Reticulocyte Hemoglobin: 25.4 pg — ABNORMAL LOW (ref >27.9)

## 2023-04-04 LAB — CBC WITH DIFFERENTIAL (CANCER CENTER ONLY)
Abs Immature Granulocytes: 0.23 10*3/uL — ABNORMAL HIGH (ref 0.00–0.07)
Basophils Absolute: 0 10*3/uL (ref 0.0–0.1)
Basophils Relative: 0 %
Eosinophils Absolute: 0 10*3/uL (ref 0.0–0.5)
Eosinophils Relative: 0 %
HCT: 33 % — ABNORMAL LOW (ref 39.0–52.0)
Hemoglobin: 10 g/dL — ABNORMAL LOW (ref 13.0–17.0)
Immature Granulocytes: 3 %
Lymphocytes Relative: 20 %
Lymphs Abs: 1.8 10*3/uL (ref 0.7–4.0)
MCH: 26.5 pg (ref 26.0–34.0)
MCHC: 30.3 g/dL (ref 30.0–36.0)
MCV: 87.5 fL (ref 80.0–100.0)
Monocytes Absolute: 0.8 10*3/uL (ref 0.1–1.0)
Monocytes Relative: 8 %
Neutro Abs: 6.3 10*3/uL (ref 1.7–7.7)
Neutrophils Relative %: 69 %
Platelet Count: 309 10*3/uL (ref 150–400)
RBC: 3.77 MIL/uL — ABNORMAL LOW (ref 4.22–5.81)
RDW: 17.1 % — ABNORMAL HIGH (ref 11.5–15.5)
WBC Count: 9.1 10*3/uL (ref 4.0–10.5)
nRBC: 0.8 % — ABNORMAL HIGH (ref 0.0–0.2)

## 2023-04-04 LAB — CMP (CANCER CENTER ONLY)
ALT: 38 U/L (ref 0–44)
AST: 29 U/L (ref 15–41)
Albumin: 3.1 g/dL — ABNORMAL LOW (ref 3.5–5.0)
Alkaline Phosphatase: 82 U/L (ref 38–126)
Anion gap: 9 (ref 5–15)
BUN: 17 mg/dL (ref 8–23)
CO2: 25 mmol/L (ref 22–32)
Calcium: 8.7 mg/dL — ABNORMAL LOW (ref 8.9–10.3)
Chloride: 104 mmol/L (ref 98–111)
Creatinine: 0.77 mg/dL (ref 0.61–1.24)
GFR, Estimated: >60 mL/min (ref >60)
Glucose, Bld: 105 mg/dL — ABNORMAL HIGH (ref 70–99)
Potassium: 3.5 mmol/L (ref 3.5–5.1)
Sodium: 138 mmol/L (ref 135–145)
Total Bilirubin: 0.4 mg/dL (ref 0.3–1.2)
Total Protein: 6.8 g/dL (ref 6.5–8.1)

## 2023-04-04 MED ORDER — FAMOTIDINE IN NACL 20-0.9 MG/50ML-% IV SOLN
20.0000 mg | Freq: Once | INTRAVENOUS | Status: AC
  Administered 2023-04-04: 20 mg via INTRAVENOUS
  Filled 2023-04-04: qty 50

## 2023-04-04 MED ORDER — HEPARIN SOD (PORK) LOCK FLUSH 100 UNIT/ML IV SOLN
500.0000 [IU] | Freq: Once | INTRAVENOUS | Status: AC | PRN
  Administered 2023-04-04: 500 [IU]
  Filled 2023-04-04: qty 5

## 2023-04-04 MED ORDER — SODIUM CHLORIDE 0.9 % IV SOLN
80.0000 mg/m2 | Freq: Once | INTRAVENOUS | Status: AC
  Administered 2023-04-04: 174 mg via INTRAVENOUS
  Filled 2023-04-04: qty 29

## 2023-04-04 MED ORDER — SODIUM CHLORIDE 0.9 % IV SOLN
Freq: Once | INTRAVENOUS | Status: AC
  Filled 2023-04-04: qty 250

## 2023-04-04 MED ORDER — SODIUM CHLORIDE 0.9 % IV SOLN
10.0000 mg | Freq: Once | INTRAVENOUS | Status: AC
  Administered 2023-04-04: 10 mg via INTRAVENOUS
  Filled 2023-04-04: qty 10

## 2023-04-04 MED ORDER — DIPHENHYDRAMINE HCL 50 MG/ML IJ SOLN
50.0000 mg | Freq: Once | INTRAMUSCULAR | Status: AC
  Administered 2023-04-04: 50 mg via INTRAVENOUS
  Filled 2023-04-04: qty 1

## 2023-04-04 NOTE — Assessment & Plan Note (Addendum)
Stage IV esophageal adenocarcinoma, liver metastatic disease, HER 2 negative. KRAS G12V, TMB 5.3, MSI Stable.  CPS 65%  1st line  FOLFOX , palliative RT --> 12/2022 CT progression--> switch to 5-FU + Nivolumab Q2 weeks  --> 02/2023 PET progression- pneumonitis--> 3rd line  Taxol and Cyramza  Labs are reviewed and discussed with patient. Tolerates chemotherapy well.  Proceed with C1D8 Taxol and cyramza

## 2023-04-04 NOTE — Assessment & Plan Note (Signed)
Chronic hypokalemia Cotinue KCL to BID. Marland Kitchen

## 2023-04-04 NOTE — Assessment & Plan Note (Signed)
Lab Results  Component Value Date   HGB 10.0 (L) 04/04/2023   TIBC 283 03/27/2023   IRONPCTSAT 13 (L) 03/27/2023   FERRITIN 489 (H) 03/27/2023  Reticulocyte hemoglobin is decreased at 23. Ferritin maybe falsely elevated . Anemia is likely due to chemotherapy and possible iron deficiency.  Continue oral iron supplementation.

## 2023-04-04 NOTE — Patient Instructions (Signed)

## 2023-04-04 NOTE — Progress Notes (Signed)
Pt here for follow up. Pt reports legs being tired and feeling "rubbery"

## 2023-04-04 NOTE — Progress Notes (Signed)
Nutrition Follow-up:  Patient with esophageal cancer. Patient on taxol and cyramza.    Met with patient during infusion.  Reports that appetite is better, having less coughing and feeling better.  Currently on prednisone but planning to taper.  Drinking some shakes but has backed off as much as eating more.  Eating [employer] ice cream.      Medications: predinsone  Labs: glucose 105  Anthropometrics:   Weight 219 lb 14.4 oz  220 lb 3.2 oz on 8/14 223 lb 1.6 oz on 7/31 236 lb 14.4 oz on 5/13 240 lb 1.6 oz on 4/22 244 lb 1/6 oz on 4/9 265 lb on 1/31   NUTRITION DIAGNOSIS: Inadequate oral intake improving    INTERVENTION:  Continue high calorie, high protein foods to maintain weight     MONITORING, EVALUATION, GOAL: weight trends, intake   NEXT VISIT: Friday, Sept 27 during infusion  Keller Bounds B. Freida Busman, RD, LDN Registered Dietitian [phone]

## 2023-04-04 NOTE — Assessment & Plan Note (Signed)
Chemotherapy plan as listed above 

## 2023-04-04 NOTE — Progress Notes (Signed)
Hematology/Oncology Progress note [phone] [phone]        REFERRING PROVIDER: Rickard Patience, MD  CHIEF COMPLAINTS/PURPOSE OF CONSULTATION:  Stage IV Esophageal adenocarcinoma  ASSESSMENT & PLAN:   Cancer Staging  Adenocarcinoma of esophagus [facility]) Staging form: Esophagus - Adenocarcinoma, AJCC 8th Edition - Clinical stage from 08/28/2022: Stage IVB (cT3, cN1, cM1) - Signed by Rickard Patience, MD on 10/04/2022   Adenocarcinoma of esophagus (HCC) Stage IV esophageal adenocarcinoma, liver metastatic disease, HER 2 negative. KRAS G12V, TMB 5.3, MSI Stable.  CPS 65%  1st line  FOLFOX , palliative RT --> 12/2022 CT progression--> switch to 5-FU + Nivolumab Q2 weeks  --> 02/2023 PET progression- pneumonitis--> 3rd line  Taxol and Cyramza  Labs are reviewed and discussed with patient. Tolerates chemotherapy well.  Proceed with C1D8 Taxol and cyramza  Hypokalemia Chronic hypokalemia Cotinue KCL to BID. Marland Kitchen      Encounter for antineoplastic chemotherapy Chemotherapy plan as listed above.   Deep venous thrombosis (HCC) Continue Eliquis 5mg  BID   Chemotherapy-induced neuropathy (HCC) Grade 2, he prefers to hold of neuropathy treatments.  observation.   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  Symptoms are improved on prednisone 60mg  daily Recommend to taper down to 40mg  daily starting 9/9  IDA (iron deficiency anemia) Lab Results  Component Value Date   HGB 10.0 (L) 04/04/2023   TIBC 283 03/27/2023   IRONPCTSAT 13 (L) 03/27/2023   FERRITIN 489 (H) 03/27/2023  Reticulocyte hemoglobin is decreased at 23. Ferritin maybe falsely elevated . Anemia is likely due to chemotherapy and possible iron deficiency.  Continue oral iron supplementation.      Orders Placed This Encounter  Procedures   Iron and TIBC    Standing Status:   Future    Standing Expiration Date:   04/24/2024   Ferritin    Standing Status:   Future    Standing Expiration Date:    04/24/2024   Retic Panel    Standing Status:   Future    Standing Expiration Date:   04/24/2024   Protein, urine, random    Standing Status:   Future    Standing Expiration Date:   04/24/2024   CBC with Differential (Cancer Center Only)    Standing Status:   Future    Standing Expiration Date:   04/24/2024   CMP (Cancer Center only)    Standing Status:   Future    Standing Expiration Date:   04/24/2024   CBC with Differential (Cancer Center Only)    Standing Status:   Future    Standing Expiration Date:   05/01/2024   CMP (Cancer Center only)    Standing Status:   Future    Standing Expiration Date:   05/01/2024   Ferritin    Standing Status:   Future    Standing Expiration Date:   05/01/2024   CBC with Differential (Cancer Center Only)    Standing Status:   Future    Standing Expiration Date:   05/08/2024   CMP (Cancer Center only)    Standing Status:   Future    Standing Expiration Date:   05/08/2024    Follow-up 1 week  All questions were answered. The patient knows to call the clinic with any problems, questions or concerns.  Rickard Patience, MD, PhD [facility] Hematology Oncology 04/04/2023   HISTORY OF PRESENTING ILLNESS:  Jonathan [name] 67 y.o. male presents to establish care for esophageal adenocarcinoma I have reviewed his chart and materials related to  his cancer extensively and collaborated history with the patient. Summary of oncologic history is as follows: Oncology History  Adenocarcinoma of esophagus (HCC)  08/28/2022 Initial Diagnosis   Adenocarcinoma of esophagus   -Patient has noticed worsening of "food stuck/fullness" sensation since November 2023.  Patient had a barium swallow study which commented on marked mucosal irregularity in the distal esophagus with Broaddus base mural filling defect highly suspicious for malignancy.  Patient establish care with gastroenterology. -08/23/2022, EGD showed gastritis and partially obstructing malignant esophageal tumor in the  lower third of the esophagus. Esophagus mass biopsy showed adenocarcinoma.  PD-L1 TPS 65%, HER2 negative.  [facility] NGS showed KRASG12V, CDKN2A, ARID1A, TP53, TMB 5.3, MSI stable.  Stomach biopsy showed gastric mucosa with no specific histology abnormality.  No significant intestinal metaplastic, dysplastic, granular atrophy or increased inflammation.     08/28/2022 Imaging   CT chest abdomen pelvis with contrast showed 1. Distal esophageal primary with gastrohepatic ligament nodal metastasis. 2. 2 right-sided pulmonary nodules, the largest of which measures 5 mm and is new since 2015. Pulmonary metastasis not be excluded. 3. Anterior right lower lobe volume loss and minimal soft tissue density, favoring atelectasis or scar. Recommend attention on follow-up. 4. Hepatic steatosis 5. Cholelithiasis 6. Left nephrolithiasis 7. Coronary artery atherosclerosis. Aortic Atherosclerosis   08/28/2022 Cancer Staging   Staging form: Esophagus - Adenocarcinoma, AJCC 8th Edition - Clinical stage from 08/28/2022: Stage IVB (cT3, cN1, cM1) - Signed by Rickard Patience, MD on 10/04/2022 Stage prefix: Initial diagnosis   09/06/2022 Imaging   PET scan showed 1. Esophageal primary with gastrohepatic ligament nodal metastasis,as on CT. 2. Focus of hypermetabolism which is favored to registered to the posterior hepatic dome, in the region of subtle heterogeneity on prior diagnostic CT. Suboptimally evaluated secondary to underlying steatosis. Recommend further evaluation with pre and post contrast abdominal MRI to confirm probable metastasis. 3. Incidental findings, including: Left nephrolithiasis.Cholelithiasis. Coronary artery atherosclerosis. Aortic Atherosclerosis    09/14/2022 - 09/14/2022 Chemotherapy   Patient is on Treatment Plan : ESOPHAGUS Carboplatin + Paclitaxel Weekly X 6 Weeks with XRT     09/23/2022 Imaging   MRI abdomen with and without contrast showed 1. Mildly T2 hyperintense segment VII hepatic lesion  measuring 2.7 cm with imaging characteristics compatible with metastatic disease. 2. Tiny focus of delayed enhancement in the inferior right lobe of the liver segment VI measuring 8 mm with ill-defined increased T2 signal and subtle corresponding reduced diffusivity, also suspicious for metastatic disease. 3. Partially visualized distal esophageal wall thickening compatible with the patient's known primary neoplasm. 4. Similar size of the 11 mm gastrohepatic ligament lymph node mildly metabolic on prior PET-CT and compatible with local nodal disease involvement. 5. 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 Recommend follow up pre and post-contrast MRI/MRCP in 1 year. 6. Diffuse hepatic steatosis.   10/10/2022 Procedure   LIVER MASS; CT-GUIDED BIOPSY:  - MODERATE TO POORLY DIFFERENTIATED ADENOCARCINOMA MORPHOLOGICALLY  CONSISTENT WITH METASTASIS FROM PATIENT'S KNOWN ESOPHAGEAL  ADENOCARCINOMA.    10/17/2022 Procedure   Medi port placed by Dr. Wyn Quaker   10/21/2022 - 01/15/2023 Chemotherapy   Patient is on Treatment Plan : ESOPHAGEAL ADENOCARCINOMA FOLFOX q14d x 6 cycles     01/15/2023 Imaging   PET showed  1. Interval progression in metastatic esophageal carcinoma as evidenced by hypermetabolic lymph nodes in the neck, chest, abdomen and pelvis, an enlarging right hepatic lobe metastasis, new bilateral adrenal  metastases and new osseous metastases. 2. Cholelithiasis. 3. Left renal stones. 4. Aortic atherosclerosis (ICD10-I70.0). Coronary artery calcification.    01/27/2023 - 03/14/2023 Chemotherapy   Patient is on Treatment Plan : GASTROESOPHAGEAL FOLFOX + Nivolumab q14d     03/20/2023 Imaging   CT chest abdomen pelvis w contrast showed 1. Interval progression of metastatic disease with multiple new and enlarged hypodense lesions throughout the liver. 2. Significant enlargement of bilateral adrenal  metastases. 3. Subtle sclerosis of an osseous metastasis of the right femoral neck. Other previously FDG avid osseous metastases of the left femoral neck and right sixth rib not appreciated by CT. 4. No persistently enlarged lymph nodes. 5. Interval development of bandlike consolidation and fibrosis of the paramedian right lung, particularly of the right lower lobe, as well as additional scattered irregular opacities throughout the right lung. 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 6. Similar circumferential wall thickening throughout the mid to lower esophagus, consistent with known primary esophageal adenocarcinoma. 7. Nonobstructive bilateral nephrolithiasis. Aortic Atherosclerosis   03/27/2023 -  Chemotherapy   Patient is on Treatment Plan : GASTROESOPHAGEAL Ramucirumab D1, 15 + Paclitaxel D1,8,15 q28d     Patient presents to establish care.  He is not taking PPI. He has intentionally lost some weight. Family history positive for father and paternal uncle with prostate cancer and sister with breast cancer. Denies any routine alcohol use  Right interval jugular vein occlusive DVT, started on Eliquis starter package on 10/28/2022, swelling of neck has improved.   CXR showed Interval development of bilateral perihilar interstitial opacities  He was treated with Azithromycin and Doxycycline to cover atypical infections.   INTERVAL HISTORY Jonathan [name] is a 67 y.o. male who has above history reviewed by me today presents for follow up visit for Stage IV esophageal adenocarcinoma.  He started on prednisone 60mg  yesterday, cough has improved. Feels better.  He denies any SOB, abdominal pain.  Gained weight.  + neuropathy of finger tips, toes/bottom of feet. "Rubbery"    MEDICAL HISTORY:  Past Medical History:  Diagnosis Date   Arthritis    Complication of anesthesia    OCCURRED ONCE YEARS AGO 1981   Esophageal mass    Hypertension     Hypothyroidism    PONV (postoperative nausea and vomiting)     SURGICAL HISTORY: Past Surgical History:  Procedure Laterality Date   COLONOSCOPY     ESOPHAGOGASTRODUODENOSCOPY N/A 08/23/2022   Procedure: ESOPHAGOGASTRODUODENOSCOPY (EGD);  Surgeon: Jaynie Collins, DO;  Location: [facility] ENDOSCOPY;  Service: Gastroenterology;  Laterality: N/A;   EUS N/A 09/12/2022   Procedure: FULL UPPER ENDOSCOPIC ULTRASOUND (EUS) RADIAL;  Surgeon: Bearl Mulberry, MD;  Location: [facility] ENDOSCOPY;  Service: Gastroenterology;  Laterality: N/A;   FINGER SURGERY     PORTA CATH INSERTION N/A 10/17/2022   Procedure: PORTA CATH INSERTION;  Surgeon: Annice Needy, MD;  Location: [facility] INVASIVE CV LAB;  Service: Cardiovascular;  Laterality: N/A;   TENDON REPAIR IN LEFT KNEE      SOCIAL HISTORY: Social History   Socioeconomic History   Marital status: Single    Spouse name: Not on file   Number of children: Not on file   Years of education: Not on file   Highest education level: Not on file  Occupational History   Not on file  Tobacco Use   Smoking status: Never   Smokeless tobacco: Never  Vaping Use   Vaping status: Never Used  Substance and Sexual Activity  Alcohol use: Yes    Comment: OCCASIONALLY   Drug use: Never   Sexual activity: Not on file  Other Topics Concern   Not on file  Social History Narrative   Not on file   Social Determinants of Health   Financial Resource Strain: Low Risk  (10/29/2022)   Received from [facility], [facility]   Overall Financial Resource Strain (CARDIA)    Difficulty of Paying Living Expenses: Not hard at all  Food Insecurity: No Food Insecurity (10/29/2022)   Received from [facility], [facility]   Hunger Vital Sign    Worried About Running Out of Food in the Last Year: Never true    Ran Out of Food in the Last Year: Never true  Transportation Needs: No Transportation Needs  (10/29/2022)   Received from [facility], [facility]   [facility] - Transportation    In the past 12 months, has lack of transportation kept you from medical appointments or from getting medications?: No    Lack of Transportation (Non-Medical): No  Physical Activity: Not on file  Stress: Not on file  Social Connections: Not on file  Intimate Partner Violence: Not At Risk (08/28/2022)   Humiliation, Afraid, Rape, and Kick questionnaire    Fear of Current or Ex-Partner: No    Emotionally Abused: No    Physically Abused: No    Sexually Abused: No    FAMILY HISTORY: Family History  Problem Relation Age of Onset   Heart attack Mother    Prostate cancer Father    Breast cancer Sister    Prostate cancer Paternal Uncle     ALLERGIES:  has No Known Allergies.  MEDICATIONS:  Current Outpatient Medications  Medication Sig Dispense Refill   acetaminophen (TYLENOL) 650 MG CR tablet Take 1,300 mg by mouth every 8 (eight) hours as needed for pain.     amLODipine (NORVASC) 10 MG tablet Take 10 mg by mouth daily.     atenolol (TENORMIN) 50 MG tablet Take 50 mg by mouth daily.     azelastine (ASTELIN) 0.1 % nasal spray Place 1 spray into both nostrils 2 (two) times daily. Use in each nostril as directed 30 mL 1   benzonatate (TESSALON) 200 MG capsule Take 1 capsule (200 mg total) by mouth 3 (three) times daily as needed for cough. 20 capsule 5   chlorhexidine (PERIDEX) 0.12 % solution Use as directed 15 mLs in the mouth or throat 2 (two) times daily. 473 mL 1   Cholecalciferol (VITAMIN D3) 125 MCG (5000 UT) CAPS Take 5,000 Units by mouth daily.     ELIQUIS 5 MG TABS tablet Take 1 tablet by mouth twice daily 60 tablet 0   hydrochlorothiazide (HYDRODIURIL) 25 MG tablet Take 25 mg by mouth daily.     hydrocortisone-nystatin-lidocaine in diphenhydrAMINE liquid Swish and swallow 5 mLs by mouth 3 (three) times daily as needed for mouth pain. 200 mL 2   Iron-Vitamin C  65-125 MG TABS Take 1 tablet by mouth daily. 30 tablet 0   levothyroxine (SYNTHROID) 125 MCG tablet Take 125 mcg by mouth every morning.     Multiple Vitamins-Minerals (MULTIVITAMIN WITH MINERALS) tablet Take 1 tablet by mouth daily.     omeprazole (PRILOSEC) 20 MG capsule Take 20 mg by mouth daily.     ondansetron (ZOFRAN) 8 MG tablet Take 1 tablet (8 mg total) by mouth every 8 (eight) hours as needed for  nausea or vomiting. Start on the third day after chemotherapy.     potassium chloride SA (KLOR-CON M) 20 MEQ tablet Take 2 tablets (40 mEq total) by mouth 2 (two) times daily. 120 tablet 0   predniSONE (DELTASONE) 20 MG tablet Take 3 tablets (60 mg total) by mouth daily with breakfast. 60 tablet 0   prochlorperazine (COMPAZINE) 10 MG tablet Take 1 tablet (10 mg total) by mouth every 6 (six) hours as needed for nausea or vomiting.     cetirizine (ZYRTEC ALLERGY) 10 MG tablet Take 1 tablet (10 mg total) by mouth at bedtime. (Patient not taking: Reported on 03/27/2023) 30 tablet 11   No current facility-administered medications for this visit.    Review of Systems  Constitutional:  Negative for appetite change, chills, fatigue and fever.  HENT:   Negative for hearing loss and voice change.        Nasal congestion, postnasal drip.   Eyes:  Negative for eye problems and icterus.  Respiratory:  Negative for chest tightness, cough and shortness of breath.   Cardiovascular:  Negative for chest pain and leg swelling.  Gastrointestinal:  Negative for abdominal distention, abdominal pain, nausea and vomiting.  Endocrine: Negative for hot flashes.  Genitourinary:  Negative for difficulty urinating, dysuria and frequency.   Musculoskeletal:  Negative for arthralgias.  Skin:  Negative for itching and rash.  Neurological:  Positive for numbness. Negative for light-headedness.  Hematological:  Negative for adenopathy. Does not bruise/bleed easily.  Psychiatric/Behavioral:  Negative for confusion.       PHYSICAL EXAMINATION: ECOG PERFORMANCE STATUS: 0 - Asymptomatic  Vitals:   04/04/23 0835  BP: 118/82  Pulse: 72  Resp: 18  Temp: 98.3 F (36.8 C)  SpO2: 99%   Filed Weights   04/04/23 0835  Weight: 219 lb 14.4 oz (99.7 kg)    Physical Exam Constitutional:      General: He is not in acute distress.    Appearance: He is obese. He is not diaphoretic.  HENT:     Head: Normocephalic and atraumatic.  Eyes:     General: No scleral icterus.    Pupils: Pupils are equal, round, and reactive to light.  Cardiovascular:     Rate and Rhythm: Normal rate and regular rhythm.  Pulmonary:     Effort: Pulmonary effort is normal. No respiratory distress.     Breath sounds: No wheezing.  Abdominal:     General: There is no distension.     Palpations: Abdomen is soft.  Musculoskeletal:        General: Normal range of motion.     Cervical back: Normal range of motion and neck supple.  Skin:    General: Skin is warm and dry.     Findings: No erythema.  Neurological:     Mental Status: He is alert and oriented to person, place, and time. Mental status is at baseline.     Cranial Nerves: No cranial nerve deficit.     Motor: No abnormal muscle tone.     Coordination: Coordination normal.  Psychiatric:        Mood and Affect: Mood and affect normal.      LABORATORY DATA:  I have reviewed the data as listed    Latest Ref Rng & Units 04/04/2023    8:11 AM 03/27/2023    8:09 AM 03/12/2023    8:02 AM  CBC  WBC 4.0 - 10.5 K/uL 9.1  8.1  5.2   Hemoglobin 13.0 -  17.0 g/dL 16.1  9.4  09.6   Hematocrit 39.0 - 52.0 % 33.0  30.9  33.2   Platelets 150 - 400 K/uL 309  285  262       Latest Ref Rng & Units 04/04/2023    8:11 AM 03/27/2023    8:09 AM 03/12/2023    8:02 AM  CMP  Glucose 70 - 99 mg/dL 045  409  811   BUN 8 - 23 mg/dL 17  11  10    Creatinine 0.61 - 1.24 mg/dL 9.14  7.82  9.56   Sodium 135 - 145 mmol/L 138  141  137   Potassium 3.5 - 5.1 mmol/L 3.5  3.2  3.0   Chloride 98  - 111 mmol/L 104  108  105   CO2 22 - 32 mmol/L 25  25  25    Calcium 8.9 - 10.3 mg/dL 8.7  9.1  9.1   Total Protein 6.5 - 8.1 g/dL 6.8  7.2  7.2   Total Bilirubin 0.3 - 1.2 mg/dL 0.4  0.3  0.5   Alkaline Phos 38 - 126 U/L 82  83  75   AST 15 - 41 U/L 29  24  25    ALT 0 - 44 U/L 38  19  22      RADIOGRAPHIC STUDIES: I have personally reviewed the radiological images as listed and agreed with the findings in the report. CT CHEST ABDOMEN PELVIS W CONTRAST  Result Date: 03/20/2023 CLINICAL DATA:  Esophageal adenocarcinoma restaging * Tracking Code: BO * EXAM: CT CHEST, ABDOMEN, AND PELVIS WITH CONTRAST TECHNIQUE: Multidetector CT imaging of the chest, abdomen and pelvis was performed following the standard protocol during bolus administration of intravenous contrast. RADIATION DOSE REDUCTION: This exam was performed according to the departmental dose-optimization program which includes automated exposure control, adjustment of the mA and/or kV according to patient size and/or use of iterative reconstruction technique. CONTRAST:  OMNIPAQUE IOHEXOL 300 MG/ML  SOLN COMPARISON:  PET-CT, 01/08/2023 FINDINGS: CT CHEST FINDINGS Cardiovascular: Right chest port catheter. Scattered aortic atherosclerosis. Normal heart size. No pericardial effusion. Mediastinum/Nodes: No enlarged mediastinal, hilar, or axillary lymph nodes. Similar circumferential wall thickening throughout the mid to lower esophagus (series 2, image 78). Thyroid gland and trachea demonstrate no significant findings. Lungs/Pleura: Interval development of bandlike consolidation and fibrosis of the paramedian right lung, particularly of the right lower lobe (series 4, image 89), as well as additional scattered irregular opacities throughout the right lung. No pleural effusion or pneumothorax. Musculoskeletal: No chest wall abnormality. No acute osseous findings. CT ABDOMEN PELVIS FINDINGS Hepatobiliary: Multiple new and enlarged hypodense  lesions throughout the liver, largest again in the posterior liver dome, centered in hepatic segment VII, measuring 9.3 x 6.2 cm, previously 4.8 x 4.7 cm (series 2, image 47). Example new lesion anteriorly in hepatic segment VIII measures 4.4 x 3.8 cm (series 2, image 44). No gallstones, gallbladder wall thickening, or biliary dilatation. Pancreas: Unremarkable. No pancreatic ductal dilatation or surrounding inflammatory changes. Spleen: Normal in size without significant abnormality. Adrenals/Urinary Tract: Significant enlargement of a right adrenal metastasis, measuring 5.9 x 4.4 cm, previously 2.5 x 2.3 cm (series 2, image 57). Interval enlargement of a left adrenal metastasis, measuring 2.2 x 1.5 cm (series 2, image 65). Small nonobstructive bilateral renal calculi. No hydronephrosis. Bladder is unremarkable. Stomach/Bowel: Stomach is within normal limits. Appendix appears normal. No evidence of bowel wall thickening, distention, or inflammatory changes. Sigmoid diverticulosis. Vascular/Lymphatic: Scattered aortic atherosclerosis. No persistently enlarged  abdominal or pelvic lymph nodes. Reproductive: Prostatomegaly. Other: No abdominal wall hernia or abnormality. No ascites. Musculoskeletal: No acute osseous findings. Subtle sclerosis of an osseous metastasis of the right femoral neck (series 2, image 116). IMPRESSION: 1. Interval progression of metastatic disease with multiple new and enlarged hypodense lesions throughout the liver. 2. Significant enlargement of bilateral adrenal metastases. 3. Subtle sclerosis of an osseous metastasis of the right femoral neck. Other previously FDG avid osseous metastases of the left femoral neck and right sixth rib not appreciated by CT. 4. No persistently enlarged lymph nodes. 5. Interval development of bandlike consolidation and fibrosis of the paramedian right lung, particularly of the right lower lobe, as well as additional scattered irregular opacities throughout the  right lung. 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 6. Similar circumferential wall thickening throughout the mid to lower esophagus, consistent with known primary esophageal adenocarcinoma. 7. Nonobstructive bilateral nephrolithiasis. Aortic Atherosclerosis (ICD10-I70.0). Electronically Signed   By: Jearld Lesch M.D.   On: 03/20/2023 19:31

## 2023-04-04 NOTE — Assessment & Plan Note (Signed)
Grade 2, he prefers to hold of neuropathy treatments.  observation.

## 2023-04-04 NOTE — Assessment & Plan Note (Signed)
Continue Eliquis 5mg BID 

## 2023-04-04 NOTE — Assessment & Plan Note (Signed)
Due to radiation/immunotherapy.  Symptoms are improved on prednisone 60mg  daily Recommend to taper down to 40mg  daily starting 9/9

## 2023-04-07 ENCOUNTER — Encounter: Payer: Self-pay | Admitting: Oncology

## 2023-04-07 DIAGNOSIS — G4733 Obstructive sleep apnea (adult) (pediatric): Secondary | ICD-10-CM | POA: Diagnosis not present

## 2023-04-10 ENCOUNTER — Ambulatory Visit: Payer: No Typology Code available for payment source

## 2023-04-10 ENCOUNTER — Other Ambulatory Visit: Payer: No Typology Code available for payment source

## 2023-04-10 MED FILL — Dexamethasone Sodium Phosphate Inj 100 MG/10ML: INTRAMUSCULAR | Qty: 1 | Status: AC

## 2023-04-11 ENCOUNTER — Encounter: Payer: Self-pay | Admitting: Oncology

## 2023-04-11 ENCOUNTER — Ambulatory Visit: Payer: No Typology Code available for payment source

## 2023-04-11 ENCOUNTER — Inpatient Hospital Stay: Payer: No Typology Code available for payment source

## 2023-04-11 ENCOUNTER — Inpatient Hospital Stay (HOSPITAL_BASED_OUTPATIENT_CLINIC_OR_DEPARTMENT_OTHER): Payer: No Typology Code available for payment source | Admitting: Oncology

## 2023-04-11 VITALS — BP 127/82 | HR 75

## 2023-04-11 VITALS — BP 120/76 | HR 88 | Temp 97.1°F | Resp 16 | Wt 217.6 lb

## 2023-04-11 DIAGNOSIS — Z5111 Encounter for antineoplastic chemotherapy: Secondary | ICD-10-CM | POA: Diagnosis not present

## 2023-04-11 DIAGNOSIS — C159 Malignant neoplasm of esophagus, unspecified: Secondary | ICD-10-CM

## 2023-04-11 DIAGNOSIS — T451X5A Adverse effect of antineoplastic and immunosuppressive drugs, initial encounter: Secondary | ICD-10-CM | POA: Diagnosis not present

## 2023-04-11 DIAGNOSIS — E876 Hypokalemia: Secondary | ICD-10-CM | POA: Diagnosis not present

## 2023-04-11 DIAGNOSIS — G620 Drug-induced polyneuropathy: Secondary | ICD-10-CM | POA: Diagnosis not present

## 2023-04-11 DIAGNOSIS — I82621 Acute embolism and thrombosis of deep veins of right upper extremity: Secondary | ICD-10-CM

## 2023-04-11 DIAGNOSIS — J984 Other disorders of lung: Secondary | ICD-10-CM | POA: Diagnosis not present

## 2023-04-11 DIAGNOSIS — D500 Iron deficiency anemia secondary to blood loss (chronic): Secondary | ICD-10-CM

## 2023-04-11 DIAGNOSIS — Z5112 Encounter for antineoplastic immunotherapy: Secondary | ICD-10-CM | POA: Diagnosis not present

## 2023-04-11 LAB — CBC WITH DIFFERENTIAL (CANCER CENTER ONLY)
Abs Immature Granulocytes: 0.09 10*3/uL — ABNORMAL HIGH (ref 0.00–0.07)
Basophils Absolute: 0 10*3/uL (ref 0.0–0.1)
Basophils Relative: 0 %
Eosinophils Absolute: 0 10*3/uL (ref 0.0–0.5)
Eosinophils Relative: 0 %
HCT: 32.3 % — ABNORMAL LOW (ref 39.0–52.0)
Hemoglobin: 9.8 g/dL — ABNORMAL LOW (ref 13.0–17.0)
Immature Granulocytes: 1 %
Lymphocytes Relative: 26 %
Lymphs Abs: 1.8 10*3/uL (ref 0.7–4.0)
MCH: 26.6 pg (ref 26.0–34.0)
MCHC: 30.3 g/dL (ref 30.0–36.0)
MCV: 87.5 fL (ref 80.0–100.0)
Monocytes Absolute: 0.5 10*3/uL (ref 0.1–1.0)
Monocytes Relative: 7 %
Neutro Abs: 4.7 10*3/uL (ref 1.7–7.7)
Neutrophils Relative %: 66 %
Platelet Count: 286 10*3/uL (ref 150–400)
RBC: 3.69 MIL/uL — ABNORMAL LOW (ref 4.22–5.81)
RDW: 17.8 % — ABNORMAL HIGH (ref 11.5–15.5)
WBC Count: 7.1 10*3/uL (ref 4.0–10.5)
nRBC: 0.6 % — ABNORMAL HIGH (ref 0.0–0.2)

## 2023-04-11 LAB — CMP (CANCER CENTER ONLY)
ALT: 37 U/L (ref 0–44)
AST: 27 U/L (ref 15–41)
Albumin: 3.4 g/dL — ABNORMAL LOW (ref 3.5–5.0)
Alkaline Phosphatase: 74 U/L (ref 38–126)
Anion gap: 6 (ref 5–15)
BUN: 17 mg/dL (ref 8–23)
CO2: 26 mmol/L (ref 22–32)
Calcium: 8.9 mg/dL (ref 8.9–10.3)
Chloride: 104 mmol/L (ref 98–111)
Creatinine: 0.87 mg/dL (ref 0.61–1.24)
GFR, Estimated: >60 mL/min (ref >60)
Glucose, Bld: 108 mg/dL — ABNORMAL HIGH (ref 70–99)
Potassium: 3.2 mmol/L — ABNORMAL LOW (ref 3.5–5.1)
Sodium: 136 mmol/L (ref 135–145)
Total Bilirubin: 0.6 mg/dL (ref 0.3–1.2)
Total Protein: 6.9 g/dL (ref 6.5–8.1)

## 2023-04-11 LAB — RETIC PANEL
Immature Retic Fract: 41.5 % — ABNORMAL HIGH (ref 2.3–15.9)
RBC.: 3.71 MIL/uL — ABNORMAL LOW (ref 4.22–5.81)
Retic Count, Absolute: 21.1 10*3/uL (ref 19.0–186.0)
Retic Ct Pct: 0.6 % (ref 0.4–3.1)
Reticulocyte Hemoglobin: 31.3 pg (ref >27.9)

## 2023-04-11 LAB — MAGNESIUM: Magnesium: 2 mg/dL (ref 1.7–2.4)

## 2023-04-11 MED ORDER — SODIUM CHLORIDE 0.9 % IV SOLN
10.0000 mg | Freq: Once | INTRAVENOUS | Status: AC
  Administered 2023-04-11: 10 mg via INTRAVENOUS
  Filled 2023-04-11: qty 10

## 2023-04-11 MED ORDER — FAMOTIDINE IN NACL 20-0.9 MG/50ML-% IV SOLN
20.0000 mg | Freq: Once | INTRAVENOUS | Status: AC
  Administered 2023-04-11: 20 mg via INTRAVENOUS
  Filled 2023-04-11: qty 50

## 2023-04-11 MED ORDER — PREDNISONE 20 MG PO TABS: 40.0000 mg | ORAL_TABLET | Freq: Every day | ORAL | 0 refills | Status: DC

## 2023-04-11 MED ORDER — SODIUM CHLORIDE 0.9 % IV SOLN
Freq: Once | INTRAVENOUS | Status: AC
  Filled 2023-04-11: qty 250

## 2023-04-11 MED ORDER — DIPHENHYDRAMINE HCL 50 MG/ML IJ SOLN
50.0000 mg | Freq: Once | INTRAMUSCULAR | Status: AC
  Administered 2023-04-11: 50 mg via INTRAVENOUS
  Filled 2023-04-11: qty 1

## 2023-04-11 MED ORDER — SODIUM CHLORIDE 0.9 % IV SOLN
80.0000 mg/m2 | Freq: Once | INTRAVENOUS | Status: AC
  Administered 2023-04-11: 174 mg via INTRAVENOUS
  Filled 2023-04-11: qty 29

## 2023-04-11 MED ORDER — SODIUM CHLORIDE 0.9 % IV SOLN
8.0000 mg/kg | Freq: Once | INTRAVENOUS | Status: AC
  Administered 2023-04-11: 800 mg via INTRAVENOUS
  Filled 2023-04-11: qty 30

## 2023-04-11 MED ORDER — HEPARIN SOD (PORK) LOCK FLUSH 100 UNIT/ML IV SOLN
500.0000 [IU] | Freq: Once | INTRAVENOUS | Status: AC | PRN
  Administered 2023-04-11: 500 [IU]
  Filled 2023-04-11: qty 5

## 2023-04-11 MED ORDER — SODIUM CHLORIDE 0.9% FLUSH
10.0000 mL | INTRAVENOUS | Status: DC | PRN
  Administered 2023-04-11: 10 mL
  Filled 2023-04-11: qty 10

## 2023-04-11 NOTE — Assessment & Plan Note (Signed)
Grade 2, he prefers to hold of neuropathy treatments.  observation.

## 2023-04-11 NOTE — Assessment & Plan Note (Addendum)
Lab Results  Component Value Date   HGB 9.8 (L) 04/11/2023   TIBC 283 03/27/2023   IRONPCTSAT 13 (L) 03/27/2023   FERRITIN 489 (H) 03/27/2023  Reticulocyte hemoglobin is decreased at 23. Ferritin maybe falsely elevated . Anemia is likely due to chemotherapy and possible iron deficiency.  Continue oral iron supplementation.

## 2023-04-11 NOTE — Patient Instructions (Signed)
[place] [facility]  Discharge Instructions: Thank you for choosing [facility] to provide your oncology and hematology care.  If you have a lab appointment with the Cancer Center, please go directly to the Cancer Center and check in at the registration area.  Wear comfortable clothing and clothing appropriate for easy access to any Portacath or PICC line.   We strive to give you quality time with your provider. You may need to reschedule your appointment if you arrive late (15 or more minutes).  Arriving late affects you and other patients whose appointments are after yours.  Also, if you miss three or more appointments without notifying the office, you may be dismissed from the clinic at the provider's discretion.      For prescription refill requests, have your pharmacy contact our office and allow 72 hours for refills to be completed.    Today you received the following chemotherapy and/or immunotherapy agents- cyramza, taxol      To help prevent nausea and vomiting after your treatment, we encourage you to take your nausea medication as directed.  BELOW ARE SYMPTOMS THAT SHOULD BE REPORTED IMMEDIATELY: *FEVER GREATER THAN 100.4 F (38 C) OR HIGHER *CHILLS OR SWEATING *NAUSEA AND VOMITING THAT IS NOT CONTROLLED WITH YOUR NAUSEA MEDICATION *UNUSUAL SHORTNESS OF BREATH *UNUSUAL BRUISING OR BLEEDING *URINARY PROBLEMS (pain or burning when urinating, or frequent urination) *BOWEL PROBLEMS (unusual diarrhea, constipation, pain near the anus) TENDERNESS IN MOUTH AND THROAT WITH OR WITHOUT PRESENCE OF ULCERS (sore throat, sores in mouth, or a toothache) UNUSUAL RASH, SWELLING OR PAIN  UNUSUAL VAGINAL DISCHARGE OR ITCHING   Items with * indicate a potential emergency and should be followed up as soon as possible or go to the Emergency Department if any problems should occur.  Please show the CHEMOTHERAPY ALERT CARD or IMMUNOTHERAPY ALERT CARD at  check-in to the Emergency Department and triage nurse.  Should you have questions after your visit or need to cancel or reschedule your appointment, please contact [place] [facility]  [phone] and follow the prompts.  Office hours are 8:00 a.m. to 4:30 p.m. Monday - Friday. Please note that voicemails left after 4:00 p.m. may not be returned until the following business day.  We are closed weekends and major holidays. You have access to a nurse at all times for urgent questions. Please call the main number to the clinic [phone] and follow the prompts.  For any non-urgent questions, you may also contact your provider using MyChart. We now offer e-Visits for anyone 61 and older to request care online for non-urgent symptoms. For details visit [email or web address].   Also download the MyChart app! Go to the app store, search "MyChart", open the app, select [facility], and log in with your MyChart username and password.

## 2023-04-11 NOTE — Assessment & Plan Note (Addendum)
Continue Eliquis 5mg  BID - plan to decrease to 2.5mg  BID in late Sept 2024

## 2023-04-11 NOTE — Assessment & Plan Note (Signed)
Chemotherapy plan as listed above

## 2023-04-11 NOTE — Progress Notes (Signed)
Hematology/Oncology Progress note [phone] [phone]        REFERRING PROVIDER: [name], MD  CHIEF COMPLAINTS/PURPOSE OF CONSULTATION:  Stage IV Esophageal adenocarcinoma  ASSESSMENT & PLAN:   Cancer Staging  Adenocarcinoma of esophagus [facility]) Staging form: Esophagus - Adenocarcinoma, AJCC 8th Edition - Clinical stage from 08/28/2022: Stage IVB (cT3, cN1, cM1) - Signed by Rickard Patience, MD on 10/04/2022   Adenocarcinoma of esophagus (HCC) Stage IV esophageal adenocarcinoma, liver metastatic disease, HER 2 negative. KRAS G12V, TMB 5.3, MSI Stable.  CPS 65%  1st line  FOLFOX , palliative RT --> 12/2022 CT progression--> switch to 5-FU + Nivolumab Q2 weeks  --> 02/2023 PET progression- pneumonitis--> 3rd line  Taxol and Cyramza  Labs are reviewed and discussed with patient. Tolerates chemotherapy well.  Proceed with C1D15 Taxol and cyramza  Hypokalemia Chronic hypokalemia Cotinue KCL to BID. Marland Kitchen      Deep venous thrombosis (HCC) Continue Eliquis 5mg  BID - plan to decrease to 2.5mg  BID in late Sept 2024  Chemotherapy-induced neuropathy (HCC) Grade 2, he prefers to hold of neuropathy treatments.  observation.   IDA (iron deficiency anemia) Lab Results  Component Value Date   HGB 9.8 (L) 04/11/2023   TIBC 283 03/27/2023   IRONPCTSAT 13 (L) 03/27/2023   FERRITIN 489 (H) 03/27/2023  Reticulocyte hemoglobin is decreased at 23. Ferritin maybe falsely elevated . Anemia is likely due to chemotherapy and possible iron deficiency.  Continue oral iron supplementation.   Encounter for antineoplastic chemotherapy Chemotherapy plan as listed above.   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  Symptoms are improved on prednisone 60mg  daily Conitnue 40mg  daily      Orders Placed This Encounter  Procedures   CEA    Standing Status:   Future    Standing Expiration Date:   05/22/2024   Protein, urine, random    Standing Status:   Future     Standing Expiration Date:   05/22/2024   CBC with Differential (Cancer Center Only)    Standing Status:   Future    Standing Expiration Date:   05/22/2024   CMP (Cancer Center only)    Standing Status:   Future    Standing Expiration Date:   05/22/2024   T4    Standing Status:   Future    Standing Expiration Date:   05/22/2024   TSH    Standing Status:   Future    Standing Expiration Date:   05/22/2024   Ferritin    Standing Status:   Future    Standing Expiration Date:   05/22/2024   CBC with Differential (Cancer Center Only)    Standing Status:   Future    Standing Expiration Date:   05/29/2024   CMP (Cancer Center only)    Standing Status:   Future    Standing Expiration Date:   05/29/2024   Ferritin    Standing Status:   Future    Standing Expiration Date:   05/29/2024   CBC with Differential (Cancer Center Only)    Standing Status:   Future    Standing Expiration Date:   06/05/2024   CMP (Cancer Center only)    Standing Status:   Future    Standing Expiration Date:   06/05/2024   Ferritin    Standing Status:   Future    Standing Expiration Date:   06/05/2024   Magnesium    Standing Status:   Future    Number of Occurrences:   1  Standing Expiration Date:   04/10/2024   CEA    Standing Status:   Future    Standing Expiration Date:   06/19/2024   Protein, urine, random    Standing Status:   Future    Standing Expiration Date:   06/19/2024   CBC with Differential (Cancer Center Only)    Standing Status:   Future    Standing Expiration Date:   06/19/2024   CMP (Cancer Center only)    Standing Status:   Future    Standing Expiration Date:   06/19/2024   Ferritin    Standing Status:   Future    Standing Expiration Date:   06/19/2024   CBC with Differential (Cancer Center Only)    Standing Status:   Future    Standing Expiration Date:   06/26/2024   CMP (Cancer Center only)    Standing Status:   Future    Standing Expiration Date:   06/26/2024   Ferritin     Standing Status:   Future    Standing Expiration Date:   06/26/2024   CBC with Differential (Cancer Center Only)    Standing Status:   Future    Standing Expiration Date:   07/03/2024   CMP (Cancer Center only)    Standing Status:   Future    Standing Expiration Date:   07/03/2024   Ferritin    Standing Status:   Future    Standing Expiration Date:   07/03/2024    Follow-up 2 weeks   All questions were answered. The patient knows to call the clinic with any problems, questions or concerns.  Rickard Patience, MD, PhD [facility] Hematology Oncology 04/11/2023   HISTORY OF PRESENTING ILLNESS:  Jonathan [name] 67 y.o. male presents to establish care for esophageal adenocarcinoma I have reviewed his chart and materials related to his cancer extensively and collaborated history with the patient. Summary of oncologic history is as follows: Oncology History  Adenocarcinoma of esophagus (HCC)  08/28/2022 Initial Diagnosis   Adenocarcinoma of esophagus   -Patient has noticed worsening of "food stuck/fullness" sensation since November 2023.  Patient had a barium swallow study which commented on marked mucosal irregularity in the distal esophagus with Broaddus base mural filling defect highly suspicious for malignancy.  Patient establish care with gastroenterology. -08/23/2022, EGD showed gastritis and partially obstructing malignant esophageal tumor in the lower third of the esophagus. Esophagus mass biopsy showed adenocarcinoma.  PD-L1 TPS 65%, HER2 negative.  [facility] NGS showed KRASG12V, CDKN2A, ARID1A, TP53, TMB 5.3, MSI stable.  Stomach biopsy showed gastric mucosa with no specific histology abnormality.  No significant intestinal metaplastic, dysplastic, granular atrophy or increased inflammation.     08/28/2022 Imaging   CT chest abdomen pelvis with contrast showed 1. Distal esophageal primary with gastrohepatic ligament nodal metastasis. 2. 2 right-sided pulmonary nodules, the largest of which  measures 5 mm and is new since 2015. Pulmonary metastasis not be excluded. 3. Anterior right lower lobe volume loss and minimal soft tissue density, favoring atelectasis or scar. Recommend attention on follow-up. 4. Hepatic steatosis 5. Cholelithiasis 6. Left nephrolithiasis 7. Coronary artery atherosclerosis. Aortic Atherosclerosis   08/28/2022 Cancer Staging   Staging form: Esophagus - Adenocarcinoma, AJCC 8th Edition - Clinical stage from 08/28/2022: Stage IVB (cT3, cN1, cM1) - Signed by Rickard Patience, MD on 10/04/2022 Stage prefix: Initial diagnosis   09/06/2022 Imaging   PET scan showed 1. Esophageal primary with gastrohepatic ligament nodal metastasis,as on CT. 2. Focus of hypermetabolism which is  favored to registered to the posterior hepatic dome, in the region of subtle heterogeneity on prior diagnostic CT. Suboptimally evaluated secondary to underlying steatosis. Recommend further evaluation with pre and post contrast abdominal MRI to confirm probable metastasis. 3. Incidental findings, including: Left nephrolithiasis.Cholelithiasis. Coronary artery atherosclerosis. Aortic Atherosclerosis    09/14/2022 - 09/14/2022 Chemotherapy   Patient is on Treatment Plan : ESOPHAGUS Carboplatin + Paclitaxel Weekly X 6 Weeks with XRT     09/23/2022 Imaging   MRI abdomen with and without contrast showed 1. Mildly T2 hyperintense segment VII hepatic lesion measuring 2.7 cm with imaging characteristics compatible with metastatic disease. 2. Tiny focus of delayed enhancement in the inferior right lobe of the liver segment VI measuring 8 mm with ill-defined increased T2 signal and subtle corresponding reduced diffusivity, also suspicious for metastatic disease. 3. Partially visualized distal esophageal wall thickening compatible with the patient's known primary neoplasm. 4. Similar size of the 11 mm gastrohepatic ligament lymph node mildly metabolic on prior PET-CT and compatible with local nodal disease  involvement. 5. 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 Recommend follow up pre and post-contrast MRI/MRCP in 1 year. 6. Diffuse hepatic steatosis.   10/10/2022 Procedure   LIVER MASS; CT-GUIDED BIOPSY:  - MODERATE TO POORLY DIFFERENTIATED ADENOCARCINOMA MORPHOLOGICALLY  CONSISTENT WITH METASTASIS FROM PATIENT'S KNOWN ESOPHAGEAL  ADENOCARCINOMA.    10/17/2022 Procedure   Medi port placed by Dr. Wyn Quaker   10/21/2022 - 01/15/2023 Chemotherapy   Patient is on Treatment Plan : ESOPHAGEAL ADENOCARCINOMA FOLFOX q14d x 6 cycles     01/15/2023 Imaging   PET showed  1. Interval progression in metastatic esophageal carcinoma as evidenced by hypermetabolic lymph nodes in the neck, chest, abdomen and pelvis, an enlarging right hepatic lobe metastasis, new bilateral adrenal metastases and new osseous metastases. 2. Cholelithiasis. 3. Left renal stones. 4. Aortic atherosclerosis (ICD10-I70.0). Coronary artery calcification.    01/27/2023 - 03/14/2023 Chemotherapy   Patient is on Treatment Plan : GASTROESOPHAGEAL FOLFOX + Nivolumab q14d     03/20/2023 Imaging   CT chest abdomen pelvis w contrast showed 1. Interval progression of metastatic disease with multiple new and enlarged hypodense lesions throughout the liver. 2. Significant enlargement of bilateral adrenal metastases. 3. Subtle sclerosis of an osseous metastasis of the right femoral neck. Other previously FDG avid osseous metastases of the left femoral neck and right sixth rib not appreciated by CT. 4. No persistently enlarged lymph nodes. 5. Interval development of bandlike consolidation and fibrosis of the paramedian right lung, particularly of the right lower lobe, as well as additional scattered irregular opacities throughout the right lung. 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 6. Similar circumferential wall  thickening throughout the mid to lower esophagus, consistent with known primary esophageal adenocarcinoma. 7. Nonobstructive bilateral nephrolithiasis. Aortic Atherosclerosis   03/27/2023 -  Chemotherapy   Patient is on Treatment Plan : GASTROESOPHAGEAL Ramucirumab D1, 15 + Paclitaxel D1,8,15 q28d     Patient presents to establish care.  He is not taking PPI. He has intentionally lost some weight. Family history positive for father and paternal uncle with prostate cancer and sister with breast cancer. Denies any routine alcohol use  Right interval jugular vein occlusive DVT, started on Eliquis starter package on 10/28/2022, swelling of neck has improved.   CXR showed Interval development of bilateral perihilar interstitial opacities  He was treated with Azithromycin and Doxycycline to cover atypical  infections.   INTERVAL HISTORY DRAYDON KANO is a 67 y.o. male who has above history reviewed by me today presents for follow up visit for Stage IV esophageal adenocarcinoma.  He started on prednisone 60mg  yesterday, cough has improved. Feels better.  He denies any SOB, abdominal pain.  Gained weight.  + neuropathy of finger tips, toes/bottom of feet. "Rubbery"    MEDICAL HISTORY:  Past Medical History:  Diagnosis Date   Arthritis    Complication of anesthesia    OCCURRED ONCE YEARS AGO 1981   Esophageal mass    Hypertension    Hypothyroidism    PONV (postoperative nausea and vomiting)     SURGICAL HISTORY: Past Surgical History:  Procedure Laterality Date   COLONOSCOPY     ESOPHAGOGASTRODUODENOSCOPY N/A 08/23/2022   Procedure: ESOPHAGOGASTRODUODENOSCOPY (EGD);  Surgeon: Jaynie Collins, DO;  Location: [facility] ENDOSCOPY;  Service: Gastroenterology;  Laterality: N/A;   EUS N/A 09/12/2022   Procedure: FULL UPPER ENDOSCOPIC ULTRASOUND (EUS) RADIAL;  Surgeon: Bearl Mulberry, MD;  Location: [facility] ENDOSCOPY;  Service: Gastroenterology;  Laterality: N/A;   FINGER SURGERY      PORTA CATH INSERTION N/A 10/17/2022   Procedure: PORTA CATH INSERTION;  Surgeon: Annice Needy, MD;  Location: [facility] INVASIVE CV LAB;  Service: Cardiovascular;  Laterality: N/A;   TENDON REPAIR IN LEFT KNEE      SOCIAL HISTORY: Social History   Socioeconomic History   Marital status: Single    Spouse name: Not on file   Number of children: Not on file   Years of education: Not on file   Highest education level: Not on file  Occupational History   Not on file  Tobacco Use   Smoking status: Never   Smokeless tobacco: Never  Vaping Use   Vaping status: Never Used  Substance and Sexual Activity   Alcohol use: Yes    Comment: OCCASIONALLY   Drug use: Never   Sexual activity: Not on file  Other Topics Concern   Not on file  Social History Narrative   Not on file   Social Determinants of Health   Financial Resource Strain: Low Risk  (10/29/2022)   Received from [facility], [facility]   Overall Financial Resource Strain (CARDIA)    Difficulty of Paying Living Expenses: Not hard at all  Food Insecurity: No Food Insecurity (10/29/2022)   Received from [facility], [facility]   Hunger Vital Sign    Worried About Running Out of Food in the Last Year: Never true    Ran Out of Food in the Last Year: Never true  Transportation Needs: No Transportation Needs (10/29/2022)   Received from [facility], [facility]   [facility] - Transportation    In the past 12 months, has lack of transportation kept you from medical appointments or from getting medications?: No    Lack of Transportation (Non-Medical): No  Physical Activity: Not on file  Stress: Not on file  Social Connections: Not on file  Intimate Partner Violence: Not At Risk (08/28/2022)   Humiliation, Afraid, Rape, and Kick questionnaire    Fear of Current or Ex-Partner: No    Emotionally Abused: No    Physically Abused: No     Sexually Abused: No    FAMILY HISTORY: Family History  Problem Relation Age of Onset   Heart attack Mother    Prostate cancer Father    Breast cancer Sister  Prostate cancer Paternal Uncle     ALLERGIES:  has No Known Allergies.  MEDICATIONS:  Current Outpatient Medications  Medication Sig Dispense Refill   acetaminophen (TYLENOL) 650 MG CR tablet Take 1,300 mg by mouth every 8 (eight) hours as needed for pain.     amLODipine (NORVASC) 10 MG tablet Take 10 mg by mouth daily.     atenolol (TENORMIN) 50 MG tablet Take 50 mg by mouth daily.     azelastine (ASTELIN) 0.1 % nasal spray Place 1 spray into both nostrils 2 (two) times daily. Use in each nostril as directed 30 mL 1   chlorhexidine (PERIDEX) 0.12 % solution Use as directed 15 mLs in the mouth or throat 2 (two) times daily. 473 mL 1   Cholecalciferol (VITAMIN D3) 125 MCG (5000 UT) CAPS Take 5,000 Units by mouth daily.     ELIQUIS 5 MG TABS tablet Take 1 tablet by mouth twice daily 60 tablet 0   hydrochlorothiazide (HYDRODIURIL) 25 MG tablet Take 25 mg by mouth daily.     Iron-Vitamin C 65-125 MG TABS Take 1 tablet by mouth daily. 30 tablet 0   levothyroxine (SYNTHROID) 125 MCG tablet Take 125 mcg by mouth every morning.     Multiple Vitamins-Minerals (MULTIVITAMIN WITH MINERALS) tablet Take 1 tablet by mouth daily.     omeprazole (PRILOSEC) 20 MG capsule Take 20 mg by mouth daily.     ondansetron (ZOFRAN) 8 MG tablet Take 1 tablet (8 mg total) by mouth every 8 (eight) hours as needed for nausea or vomiting. Start on the third day after chemotherapy.     potassium chloride SA (KLOR-CON M) 20 MEQ tablet Take 2 tablets (40 mEq total) by mouth 2 (two) times daily. 120 tablet 0   prochlorperazine (COMPAZINE) 10 MG tablet Take 1 tablet (10 mg total) by mouth every 6 (six) hours as needed for nausea or vomiting.     benzonatate (TESSALON) 200 MG capsule Take 1 capsule (200 mg total) by mouth 3 (three) times daily as needed for cough.  (Patient not taking: Reported on 04/11/2023) 20 capsule 5   cetirizine (ZYRTEC ALLERGY) 10 MG tablet Take 1 tablet (10 mg total) by mouth at bedtime. (Patient not taking: Reported on 03/27/2023) 30 tablet 11   hydrocortisone-nystatin-lidocaine in diphenhydrAMINE liquid Swish and swallow 5 mLs by mouth 3 (three) times daily as needed for mouth pain. (Patient not taking: Reported on 04/11/2023) 200 mL 2   predniSONE (DELTASONE) 20 MG tablet Take 2 tablets (40 mg total) by mouth daily with breakfast. 60 tablet 0   No current facility-administered medications for this visit.   Facility-Administered Medications Ordered in Other Visits  Medication Dose Route Frequency Provider Last Rate Last Admin   sodium chloride flush (NS) 0.9 % injection 10 mL  10 mL Intracatheter PRN Rickard Patience, MD   10 mL at 04/11/23 1316    Review of Systems  Constitutional:  Negative for appetite change, chills, fatigue and fever.  HENT:   Negative for hearing loss and voice change.        Nasal congestion, postnasal drip.   Eyes:  Negative for eye problems and icterus.  Respiratory:  Negative for chest tightness, cough and shortness of breath.   Cardiovascular:  Negative for chest pain and leg swelling.  Gastrointestinal:  Negative for abdominal distention, abdominal pain, nausea and vomiting.  Endocrine: Negative for hot flashes.  Genitourinary:  Negative for difficulty urinating, dysuria and frequency.   Musculoskeletal:  Negative for arthralgias.  Skin:  Negative for itching and rash.  Neurological:  Positive for numbness. Negative for light-headedness.  Hematological:  Negative for adenopathy. Does not bruise/bleed easily.  Psychiatric/Behavioral:  Negative for confusion.      PHYSICAL EXAMINATION: ECOG PERFORMANCE STATUS: 0 - Asymptomatic  Vitals:   04/11/23 0907  BP: 120/76  Pulse: 88  Resp: 16  Temp: (!) 97.1 F (36.2 C)  SpO2: 99%   Filed Weights   04/11/23 0907  Weight: 217 lb 9.6 oz (98.7 kg)     Physical Exam Constitutional:      General: He is not in acute distress.    Appearance: He is obese. He is not diaphoretic.  HENT:     Head: Normocephalic and atraumatic.  Eyes:     General: No scleral icterus.    Pupils: Pupils are equal, round, and reactive to light.  Cardiovascular:     Rate and Rhythm: Normal rate and regular rhythm.  Pulmonary:     Effort: Pulmonary effort is normal. No respiratory distress.     Breath sounds: No wheezing.  Abdominal:     General: There is no distension.     Palpations: Abdomen is soft.  Musculoskeletal:        General: Normal range of motion.     Cervical back: Normal range of motion and neck supple.  Skin:    General: Skin is warm and dry.     Findings: No erythema.  Neurological:     Mental Status: He is alert and oriented to person, place, and time. Mental status is at baseline.     Cranial Nerves: No cranial nerve deficit.     Motor: No abnormal muscle tone.     Coordination: Coordination normal.  Psychiatric:        Mood and Affect: Mood and affect normal.      LABORATORY DATA:  I have reviewed the data as listed    Latest Ref Rng & Units 04/11/2023    8:26 AM 04/04/2023    8:11 AM 03/27/2023    8:09 AM  CBC  WBC 4.0 - 10.5 K/uL 7.1  9.1  8.1   Hemoglobin 13.0 - 17.0 g/dL 9.8  24.4  9.4   Hematocrit 39.0 - 52.0 % 32.3  33.0  30.9   Platelets 150 - 400 K/uL 286  309  285       Latest Ref Rng & Units 04/11/2023    8:26 AM 04/04/2023    8:11 AM 03/27/2023    8:09 AM  CMP  Glucose 70 - 99 mg/dL 010  272  536   BUN 8 - 23 mg/dL 17  17  11    Creatinine 0.61 - 1.24 mg/dL 6.44  0.34  7.42   Sodium 135 - 145 mmol/L 136  138  141   Potassium 3.5 - 5.1 mmol/L 3.2  3.5  3.2   Chloride 98 - 111 mmol/L 104  104  108   CO2 22 - 32 mmol/L 26  25  25    Calcium 8.9 - 10.3 mg/dL 8.9  8.7  9.1   Total Protein 6.5 - 8.1 g/dL 6.9  6.8  7.2   Total Bilirubin 0.3 - 1.2 mg/dL 0.6  0.4  0.3   Alkaline Phos 38 - 126 U/L 74  82  83   AST  15 - 41 U/L 27  29  24    ALT 0 - 44 U/L 37  38  19      RADIOGRAPHIC STUDIES:  I have personally reviewed the radiological images as listed and agreed with the findings in the report. CT CHEST ABDOMEN PELVIS W CONTRAST  Result Date: 03/20/2023 CLINICAL DATA:  Esophageal adenocarcinoma restaging * Tracking Code: BO * EXAM: CT CHEST, ABDOMEN, AND PELVIS WITH CONTRAST TECHNIQUE: Multidetector CT imaging of the chest, abdomen and pelvis was performed following the standard protocol during bolus administration of intravenous contrast. RADIATION DOSE REDUCTION: This exam was performed according to the departmental dose-optimization program which includes automated exposure control, adjustment of the mA and/or kV according to patient size and/or use of iterative reconstruction technique. CONTRAST:  OMNIPAQUE IOHEXOL 300 MG/ML  SOLN COMPARISON:  PET-CT, 01/08/2023 FINDINGS: CT CHEST FINDINGS Cardiovascular: Right chest port catheter. Scattered aortic atherosclerosis. Normal heart size. No pericardial effusion. Mediastinum/Nodes: No enlarged mediastinal, hilar, or axillary lymph nodes. Similar circumferential wall thickening throughout the mid to lower esophagus (series 2, image 78). Thyroid gland and trachea demonstrate no significant findings. Lungs/Pleura: Interval development of bandlike consolidation and fibrosis of the paramedian right lung, particularly of the right lower lobe (series 4, image 89), as well as additional scattered irregular opacities throughout the right lung. No pleural effusion or pneumothorax. Musculoskeletal: No chest wall abnormality. No acute osseous findings. CT ABDOMEN PELVIS FINDINGS Hepatobiliary: Multiple new and enlarged hypodense lesions throughout the liver, largest again in the posterior liver dome, centered in hepatic segment VII, measuring 9.3 x 6.2 cm, previously 4.8 x 4.7 cm (series 2, image 47). Example new lesion anteriorly in hepatic segment VIII measures 4.4 x 3.8  cm (series 2, image 44). No gallstones, gallbladder wall thickening, or biliary dilatation. Pancreas: Unremarkable. No pancreatic ductal dilatation or surrounding inflammatory changes. Spleen: Normal in size without significant abnormality. Adrenals/Urinary Tract: Significant enlargement of a right adrenal metastasis, measuring 5.9 x 4.4 cm, previously 2.5 x 2.3 cm (series 2, image 57). Interval enlargement of a left adrenal metastasis, measuring 2.2 x 1.5 cm (series 2, image 65). Small nonobstructive bilateral renal calculi. No hydronephrosis. Bladder is unremarkable. Stomach/Bowel: Stomach is within normal limits. Appendix appears normal. No evidence of bowel wall thickening, distention, or inflammatory changes. Sigmoid diverticulosis. Vascular/Lymphatic: Scattered aortic atherosclerosis. No persistently enlarged abdominal or pelvic lymph nodes. Reproductive: Prostatomegaly. Other: No abdominal wall hernia or abnormality. No ascites. Musculoskeletal: No acute osseous findings. Subtle sclerosis of an osseous metastasis of the right femoral neck (series 2, image 116). IMPRESSION: 1. Interval progression of metastatic disease with multiple new and enlarged hypodense lesions throughout the liver. 2. Significant enlargement of bilateral adrenal metastases. 3. Subtle sclerosis of an osseous metastasis of the right femoral neck. Other previously FDG avid osseous metastases of the left femoral neck and right sixth rib not appreciated by CT. 4. No persistently enlarged lymph nodes. 5. Interval development of bandlike consolidation and fibrosis of the paramedian right lung, particularly of the right lower lobe, as well as additional scattered irregular opacities throughout the right lung. 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 6. Similar circumferential wall thickening throughout the mid to lower esophagus, consistent with known primary esophageal adenocarcinoma. 7.  Nonobstructive bilateral nephrolithiasis. Aortic Atherosclerosis (ICD10-I70.0). Electronically Signed   By: Jearld Lesch M.D.   On: 03/20/2023 19:31

## 2023-04-11 NOTE — Assessment & Plan Note (Signed)
Due to radiation/immunotherapy.  Symptoms are improved on prednisone 60mg  daily Conitnue 40mg  daily

## 2023-04-11 NOTE — Assessment & Plan Note (Addendum)
Stage IV esophageal adenocarcinoma, liver metastatic disease, HER 2 negative. KRAS G12V, TMB 5.3, MSI Stable.  CPS 65%  1st line  FOLFOX , palliative RT --> 12/2022 CT progression--> switch to 5-FU + Nivolumab Q2 weeks  --> 02/2023 PET progression- pneumonitis--> 3rd line  Taxol and Cyramza  Labs are reviewed and discussed with patient. Tolerates chemotherapy well.  Proceed with C1D15 Taxol and cyramza

## 2023-04-11 NOTE — Progress Notes (Signed)
Pt in for follow up, reports cough has improved but mucus production has increased. Reports starting to notice some tingling in fingers and toes.

## 2023-04-11 NOTE — Assessment & Plan Note (Signed)
Chronic hypokalemia Cotinue KCL to BID. Marland Kitchen

## 2023-04-13 LAB — CEA: CEA: 64 ng/mL — ABNORMAL HIGH (ref 0.0–4.7)

## 2023-04-15 ENCOUNTER — Other Ambulatory Visit: Payer: Self-pay | Admitting: Oncology

## 2023-04-15 DIAGNOSIS — Z515 Encounter for palliative care: Secondary | ICD-10-CM | POA: Diagnosis not present

## 2023-04-15 DIAGNOSIS — Z008 Encounter for other general examination: Secondary | ICD-10-CM | POA: Diagnosis not present

## 2023-04-15 DIAGNOSIS — C7951 Secondary malignant neoplasm of bone: Secondary | ICD-10-CM | POA: Diagnosis not present

## 2023-04-15 DIAGNOSIS — C787 Secondary malignant neoplasm of liver and intrahepatic bile duct: Secondary | ICD-10-CM | POA: Diagnosis not present

## 2023-04-15 DIAGNOSIS — C159 Malignant neoplasm of esophagus, unspecified: Secondary | ICD-10-CM | POA: Diagnosis not present

## 2023-04-15 DIAGNOSIS — R530 Neoplastic (malignant) related fatigue: Secondary | ICD-10-CM | POA: Diagnosis not present

## 2023-04-21 ENCOUNTER — Encounter: Payer: Self-pay | Admitting: Oncology

## 2023-04-22 ENCOUNTER — Other Ambulatory Visit: Payer: Self-pay

## 2023-04-22 DIAGNOSIS — G4733 Obstructive sleep apnea (adult) (pediatric): Secondary | ICD-10-CM | POA: Diagnosis not present

## 2023-04-24 ENCOUNTER — Other Ambulatory Visit: Payer: Self-pay

## 2023-04-24 MED FILL — Dexamethasone Sodium Phosphate Inj 100 MG/10ML: INTRAMUSCULAR | Qty: 1 | Status: AC

## 2023-04-25 ENCOUNTER — Inpatient Hospital Stay: Payer: No Typology Code available for payment source

## 2023-04-25 ENCOUNTER — Inpatient Hospital Stay (HOSPITAL_BASED_OUTPATIENT_CLINIC_OR_DEPARTMENT_OTHER): Payer: No Typology Code available for payment source | Admitting: Oncology

## 2023-04-25 ENCOUNTER — Encounter: Payer: Self-pay | Admitting: Oncology

## 2023-04-25 VITALS — BP 126/87 | HR 70 | Temp 96.1°F | Resp 18 | Wt 217.9 lb

## 2023-04-25 DIAGNOSIS — E876 Hypokalemia: Secondary | ICD-10-CM

## 2023-04-25 DIAGNOSIS — C787 Secondary malignant neoplasm of liver and intrahepatic bile duct: Secondary | ICD-10-CM | POA: Diagnosis not present

## 2023-04-25 DIAGNOSIS — T451X5A Adverse effect of antineoplastic and immunosuppressive drugs, initial encounter: Secondary | ICD-10-CM

## 2023-04-25 DIAGNOSIS — I82621 Acute embolism and thrombosis of deep veins of right upper extremity: Secondary | ICD-10-CM | POA: Diagnosis not present

## 2023-04-25 DIAGNOSIS — D500 Iron deficiency anemia secondary to blood loss (chronic): Secondary | ICD-10-CM

## 2023-04-25 DIAGNOSIS — Z5111 Encounter for antineoplastic chemotherapy: Secondary | ICD-10-CM

## 2023-04-25 DIAGNOSIS — Z5112 Encounter for antineoplastic immunotherapy: Secondary | ICD-10-CM | POA: Diagnosis not present

## 2023-04-25 DIAGNOSIS — G620 Drug-induced polyneuropathy: Secondary | ICD-10-CM

## 2023-04-25 DIAGNOSIS — C159 Malignant neoplasm of esophagus, unspecified: Secondary | ICD-10-CM

## 2023-04-25 DIAGNOSIS — J984 Other disorders of lung: Secondary | ICD-10-CM

## 2023-04-25 LAB — CMP (CANCER CENTER ONLY)
ALT: 32 U/L (ref 0–44)
AST: 20 U/L (ref 15–41)
Albumin: 3.4 g/dL — ABNORMAL LOW (ref 3.5–5.0)
Alkaline Phosphatase: 57 U/L (ref 38–126)
Anion gap: 9 (ref 5–15)
BUN: 18 mg/dL (ref 8–23)
CO2: 25 mmol/L (ref 22–32)
Calcium: 9.5 mg/dL (ref 8.9–10.3)
Chloride: 104 mmol/L (ref 98–111)
Creatinine: 0.87 mg/dL (ref 0.61–1.24)
GFR, Estimated: >60 mL/min (ref >60)
Glucose, Bld: 96 mg/dL (ref 70–99)
Potassium: 3.9 mmol/L (ref 3.5–5.1)
Sodium: 138 mmol/L (ref 135–145)
Total Bilirubin: 0.5 mg/dL (ref 0.3–1.2)
Total Protein: 6.9 g/dL (ref 6.5–8.1)

## 2023-04-25 LAB — CBC WITH DIFFERENTIAL (CANCER CENTER ONLY)
Abs Immature Granulocytes: 0.25 10*3/uL — ABNORMAL HIGH (ref 0.00–0.07)
Basophils Absolute: 0 10*3/uL (ref 0.0–0.1)
Basophils Relative: 0 %
Eosinophils Absolute: 0 10*3/uL (ref 0.0–0.5)
Eosinophils Relative: 0 %
HCT: 33.2 % — ABNORMAL LOW (ref 39.0–52.0)
Hemoglobin: 10 g/dL — ABNORMAL LOW (ref 13.0–17.0)
Immature Granulocytes: 3 %
Lymphocytes Relative: 24 %
Lymphs Abs: 1.8 10*3/uL (ref 0.7–4.0)
MCH: 26.8 pg (ref 26.0–34.0)
MCHC: 30.1 g/dL (ref 30.0–36.0)
MCV: 89 fL (ref 80.0–100.0)
Monocytes Absolute: 0.9 10*3/uL (ref 0.1–1.0)
Monocytes Relative: 12 %
Neutro Abs: 4.4 10*3/uL (ref 1.7–7.7)
Neutrophils Relative %: 61 %
Platelet Count: 206 10*3/uL (ref 150–400)
RBC: 3.73 MIL/uL — ABNORMAL LOW (ref 4.22–5.81)
RDW: 20.6 % — ABNORMAL HIGH (ref 11.5–15.5)
WBC Count: 7.4 10*3/uL (ref 4.0–10.5)
nRBC: 0.5 % — ABNORMAL HIGH (ref 0.0–0.2)

## 2023-04-25 LAB — FERRITIN: Ferritin: 357 ng/mL — ABNORMAL HIGH (ref 24–336)

## 2023-04-25 LAB — IRON AND TIBC
Iron: 122 ug/dL (ref 45–182)
Saturation Ratios: 35 % (ref 17.9–39.5)
TIBC: 353 ug/dL (ref 250–450)
UIBC: 231 ug/dL

## 2023-04-25 LAB — RETIC PANEL
Immature Retic Fract: 29.6 % — ABNORMAL HIGH (ref 2.3–15.9)
RBC.: 3.76 MIL/uL — ABNORMAL LOW (ref 4.22–5.81)
Retic Count, Absolute: 135.7 10*3/uL (ref 19.0–186.0)
Retic Ct Pct: 3.6 % — ABNORMAL HIGH (ref 0.4–3.1)
Reticulocyte Hemoglobin: 29.2 pg (ref >27.9)

## 2023-04-25 LAB — PROTEIN, URINE, RANDOM: Total Protein, Urine: <6 mg/dL

## 2023-04-25 MED ORDER — PREDNISONE 10 MG PO TABS: 10.0000 mg | ORAL_TABLET | Freq: Every day | ORAL | 0 refills | Status: DC

## 2023-04-25 MED ORDER — SODIUM CHLORIDE 0.9 % IV SOLN
80.0000 mg/m2 | Freq: Once | INTRAVENOUS | Status: AC
  Administered 2023-04-25: 174 mg via INTRAVENOUS
  Filled 2023-04-25: qty 29

## 2023-04-25 MED ORDER — APIXABAN 2.5 MG PO TABS: 2.5000 mg | ORAL_TABLET | Freq: Two times a day (BID) | ORAL | 2 refills | Status: DC

## 2023-04-25 MED ORDER — SODIUM CHLORIDE 0.9 % IV SOLN
10.0000 mg | Freq: Once | INTRAVENOUS | Status: AC
  Administered 2023-04-25: 10 mg via INTRAVENOUS
  Filled 2023-04-25: qty 10

## 2023-04-25 MED ORDER — FAMOTIDINE IN NACL 20-0.9 MG/50ML-% IV SOLN
20.0000 mg | Freq: Once | INTRAVENOUS | Status: AC
  Administered 2023-04-25: 20 mg via INTRAVENOUS
  Filled 2023-04-25: qty 50

## 2023-04-25 MED ORDER — HEPARIN SOD (PORK) LOCK FLUSH 100 UNIT/ML IV SOLN
500.0000 [IU] | Freq: Once | INTRAVENOUS | Status: AC | PRN
  Administered 2023-04-25: 500 [IU]
  Filled 2023-04-25: qty 5

## 2023-04-25 MED ORDER — SODIUM CHLORIDE 0.9 % IV SOLN
8.0000 mg/kg | Freq: Once | INTRAVENOUS | Status: AC
  Administered 2023-04-25: 800 mg via INTRAVENOUS
  Filled 2023-04-25: qty 30

## 2023-04-25 MED ORDER — DIPHENHYDRAMINE HCL 50 MG/ML IJ SOLN
50.0000 mg | Freq: Once | INTRAMUSCULAR | Status: AC
  Administered 2023-04-25: 50 mg via INTRAVENOUS
  Filled 2023-04-25: qty 1

## 2023-04-25 MED ORDER — SODIUM CHLORIDE 0.9 % IV SOLN
Freq: Once | INTRAVENOUS | Status: AC
  Filled 2023-04-25: qty 250

## 2023-04-25 NOTE — Progress Notes (Signed)
Nutrition Follow-up:   Patient with esophageal cancer.  Patient on taxol and cyramza.  Met with patient during infusion.  Reports that his appetite is about the same.  Sometimes able to eat 100% of meal and other times 50%.  Having taste alterations.  Drinking some protein shakes but not daily.  Eating some ice cream as well.  Having sinus drainage that sometimes makes him nauseated.     Medications: reviewed  Labs: reviewed  Anthropometrics:   Weight 217 lb 14.4 oz today 219 lb on 9/6 220 lb on 8/14 223 lb on 7/31 236 lb on 5/13 240 lb on 4/22 244 lb on 4/9 265 lb on 1/31   NUTRITION DIAGNOSIS: Inadequate oral intake stable    INTERVENTION:  Continue high calorie, high protein foods to help maintain weight Continue 350 calorie oral nutrition supplement    MONITORING, EVALUATION, GOAL: weight trends, intake   NEXT VISIT: Friday, Oct 25 during infusion  Xitlali Kastens B. Freida Busman, RD, LDN Registered Dietitian [phone]

## 2023-04-25 NOTE — Assessment & Plan Note (Signed)
Chronic hypokalemia Cotinue KCL to BID. Marland Kitchen

## 2023-04-25 NOTE — Assessment & Plan Note (Addendum)
decrease to 2.5mg  BID, new Rx sent to pharmacy

## 2023-04-25 NOTE — Assessment & Plan Note (Addendum)
Due to radiation/immunotherapy.  Symptoms are improved, currently on prednisone 40mg  daily Conitnue 40mg  daily x  1week, followed by 30mg  for 2 weeks, followed by 20mg  until his next visit.

## 2023-04-25 NOTE — Patient Instructions (Signed)
[facility]  Discharge Instructions: Thank you for choosing [facility] to provide your oncology and hematology care.  If you have a lab appointment with the Cancer Center, please go directly to the Cancer Center and check in at the registration area.  Wear comfortable clothing and clothing appropriate for easy access to any Portacath or PICC line.   We strive to give you quality time with your provider. You may need to reschedule your appointment if you arrive late (15 or more minutes).  Arriving late affects you and other patients whose appointments are after yours.  Also, if you miss three or more appointments without notifying the office, you may be dismissed from the clinic at the provider's discretion.      For prescription refill requests, have your pharmacy contact our office and allow 72 hours for refills to be completed.    Today you received the following chemotherapy and/or immunotherapy agents CYRAMZA and TAXOL      To help prevent nausea and vomiting after your treatment, we encourage you to take your nausea medication as directed.  BELOW ARE SYMPTOMS THAT SHOULD BE REPORTED IMMEDIATELY: *FEVER GREATER THAN 100.4 F (38 C) OR HIGHER *CHILLS OR SWEATING *NAUSEA AND VOMITING THAT IS NOT CONTROLLED WITH YOUR NAUSEA MEDICATION *UNUSUAL SHORTNESS OF BREATH *UNUSUAL BRUISING OR BLEEDING *URINARY PROBLEMS (pain or burning when urinating, or frequent urination) *BOWEL PROBLEMS (unusual diarrhea, constipation, pain near the anus) TENDERNESS IN MOUTH AND THROAT WITH OR WITHOUT PRESENCE OF ULCERS (sore throat, sores in mouth, or a toothache) UNUSUAL RASH, SWELLING OR PAIN  UNUSUAL VAGINAL DISCHARGE OR ITCHING   Items with * indicate a potential emergency and should be followed up as soon as possible or go to the Emergency Department if any problems should occur.  Please show the CHEMOTHERAPY ALERT CARD or IMMUNOTHERAPY ALERT CARD at  check-in to the Emergency Department and triage nurse.  Should you have questions after your visit or need to cancel or reschedule your appointment, please contact [facility]  [phone] and follow the prompts.  Office hours are 8:00 a.m. to 4:30 p.m. Monday - Friday. Please note that voicemails left after 4:00 p.m. may not be returned until the following business day.  We are closed weekends and major holidays. You have access to a nurse at all times for urgent questions. Please call the main number to the clinic [phone] and follow the prompts.  For any non-urgent questions, you may also contact your provider using MyChart. We now offer e-Visits for anyone 72 and older to request care online for non-urgent symptoms. For details visit [email or web address].   Also download the MyChart app! Go to the app store, search "MyChart", open the app, select , and log in with your MyChart username and password.  Ramucirumab Injection What is this medication? RAMUCIRUMAB (ra mue SIR ue mab) treats some types of cancer. It works by blocking a protein that causes cancer cells to grow and multiply. This helps to slow or stop the spread of cancer cells. It is a monoclonal antibody. This medicine may be used for other purposes; ask your health care provider or pharmacist if you have questions. COMMON BRAND NAME(S): Cyramza What should I tell my care team before I take this medication? They need to know if you have any of these conditions: Blood clots Having or recent surgery Heart attack High blood pressure History of a tear in your stomach or  intestines Liver disease Protein in your urine Stomach bleeding Stroke Thyroid disease An unusual or allergic reaction to ramucirumab, other medications, foods, dyes, or preservatives Pregnant or trying to get pregnant Breast-feeding How should I use this medication? This medication is injected into a vein. It  is given by your care team in a hospital or clinic setting. Talk to your care team about the use of this medication in children. Special care may be needed. Overdosage: If you think you have taken too much of this medicine contact a poison control center or emergency room at once. NOTE: This medicine is only for you. Do not share this medicine with others. What if I miss a dose? Keep appointments for follow-up doses. It is important not to miss your dose. Call your care team if you are unable to keep an appointment. What may interact with this medication? Interactions have not been studied. This list may not describe all possible interactions. Give your health care provider a list of all the medicines, herbs, non-prescription drugs, or dietary supplements you use. Also tell them if you smoke, drink alcohol, or use illegal drugs. Some items may interact with your medicine. What should I watch for while using this medication? Your condition will be monitored carefully while you are receiving this medication. You may need blood work while taking this medication. This medication may make you feel generally unwell. This is not uncommon as chemotherapy can affect health cells as well as cancer cells. Report any side effects. Continue your course of treatment even though you feel ill unless your care team tells you to stop. This medication may increase your risk to bruise or bleed. Call your care team if you notice any unusual bleeding. Before having surgery, talk to your care team to make sure it is ok. This medication can increase the risk of poor healing of your surgical site or wound. You will need to stop this medication for 28 days before surgery. After surgery, wait at least 2 weeks before restarting this medication. Make sure the surgical site or wound is healed enough before restarting this medication. Talk to your care team if questions. Talk to your care team if you may be pregnant. Serious birth  defects can occur if you take this medication during pregnancy and for 3 months after the last dose. You will need a negative pregnancy test before starting this medication. Contraception is recommended while taking this medication and for 3 months after the last dose. Your care team can help you find the option that works for you. Do not breastfeed while taking this medication and for 2 months after the last dose. This medication may cause infertility. Talk to your care team if you are concerned about your fertility. What side effects may I notice from receiving this medication? Side effects that you should report to your care team as soon as possible: Allergic reactions--skin rash, itching, hives, swelling of the face, lips, tongue, or throat Bleeding--bloody or black, tar-like stools, vomiting blood or brown material that looks like coffee grounds, red or dark brown urine, small red or purple spots on skin, unusual bruising or bleeding Dizziness, loss of balance or coordination, confusion or trouble speaking Heart attack--pain or tightness in the chest, shoulders, arms, or jaw, nausea, shortness of breath, cold or clammy skin, feeling faint or lightheaded Increase in blood pressure Infection--fever, chills, cough, sore throat, wounds that don't heal, pain or trouble when passing urine, general feeling of discomfort or being unwell Infusion reactions--chest  pain, shortness of breath or trouble breathing, feeling faint or lightheaded Kidney injury--decrease in the amount of urine, swelling of the ankles, hands, or feet Liver injury--right upper belly pain, loss of appetite, nausea, light-colored stool, dark yellow or brown urine, yellowing skin or eyes, unusual weakness or fatigue Low thyroid levels (hypothyroidism)--unusual weakness or fatigue, increased sensitivity to cold, constipation, hair loss, dry skin, weight gain, feelings of depression Stomach pain that is severe, does not go away, or gets  worse Stroke--sudden numbness or weakness of the face, arm, or leg, trouble speaking, confusion, trouble walking, loss of balance or coordination, dizziness, severe headache, change in vision Sudden and severe headache, confusion, change in vision, seizures, which may be signs of posterior reversible encephalopathy syndrome (PRES) Side effects that usually do not require medical attention (report to your care team if they continue or are bothersome): Diarrhea Fatigue Stomach pain Swelling of the ankles, hands, or feet This list may not describe all possible side effects. Call your doctor for medical advice about side effects. You may report side effects to FDA at 1-800-FDA-1088. Where should I keep my medication? This medication is given in a hospital or clinic. It will not be stored at home. NOTE: This sheet is a summary. It may not cover all possible information. If you have questions about this medicine, talk to your doctor, pharmacist, or health care provider.  2024 Elsevier/Gold Standard (2021-12-06 00:00:00)  Paclitaxel Injection What is this medication? PACLITAXEL (PAK li TAX el) treats some types of cancer. It works by slowing down the growth of cancer cells. This medicine may be used for other purposes; ask your health care provider or pharmacist if you have questions. COMMON BRAND NAME(S): Onxol, Taxol What should I tell my care team before I take this medication? They need to know if you have any of these conditions: Heart disease Liver disease Low white blood cell levels An unusual or allergic reaction to paclitaxel, other medications, foods, dyes, or preservatives If you or your partner are pregnant or trying to get pregnant Breast-feeding How should I use this medication? This medication is injected into a vein. It is given by your care team in a hospital or clinic setting. Talk to your care team about the use of this medication in children. While it may be given to  children for selected conditions, precautions do apply. Overdosage: If you think you have taken too much of this medicine contact a poison control center or emergency room at once. NOTE: This medicine is only for you. Do not share this medicine with others. What if I miss a dose? Keep appointments for follow-up doses. It is important not to miss your dose. Call your care team if you are unable to keep an appointment. What may interact with this medication? Do not take this medication with any of the following: Live virus vaccines Other medications may affect the way this medication works. Talk with your care team about all of the medications you take. They may suggest changes to your treatment plan to lower the risk of side effects and to make sure your medications work as intended. This list may not describe all possible interactions. Give your health care provider a list of all the medicines, herbs, non-prescription drugs, or dietary supplements you use. Also tell them if you smoke, drink alcohol, or use illegal drugs. Some items may interact with your medicine. What should I watch for while using this medication? Your condition will be monitored carefully while you are  receiving this medication. You may need blood work while taking this medication. This medication may make you feel generally unwell. This is not uncommon as chemotherapy can affect healthy cells as well as cancer cells. Report any side effects. Continue your course of treatment even though you feel ill unless your care team tells you to stop. This medication can cause serious allergic reactions. To reduce the risk, your care team may give you other medications to take before receiving this one. Be sure to follow the directions from your care team. This medication may increase your risk of getting an infection. Call your care team for advice if you get a fever, chills, sore throat, or other symptoms of a cold or flu. Do not treat  yourself. Try to avoid being around people who are sick. This medication may increase your risk to bruise or bleed. Call your care team if you notice any unusual bleeding. Be careful brushing or flossing your teeth or using a toothpick because you may get an infection or bleed more easily. If you have any dental work done, tell your dentist you are receiving this medication. Talk to your care team if you may be pregnant. Serious birth defects can occur if you take this medication during pregnancy. Talk to your care team before breastfeeding. Changes to your treatment plan may be needed. What side effects may I notice from receiving this medication? Side effects that you should report to your care team as soon as possible: Allergic reactions--skin rash, itching, hives, swelling of the face, lips, tongue, or throat Heart rhythm changes--fast or irregular heartbeat, dizziness, feeling faint or lightheaded, chest pain, trouble breathing Increase in blood pressure Infection--fever, chills, cough, sore throat, wounds that don't heal, pain or trouble when passing urine, general feeling of discomfort or being unwell Low blood pressure--dizziness, feeling faint or lightheaded, blurry vision Low red blood cell level--unusual weakness or fatigue, dizziness, headache, trouble breathing Painful swelling, warmth, or redness of the skin, blisters or sores at the infusion site Pain, tingling, or numbness in the hands or feet Slow heartbeat--dizziness, feeling faint or lightheaded, confusion, trouble breathing, unusual weakness or fatigue Unusual bruising or bleeding Side effects that usually do not require medical attention (report to your care team if they continue or are bothersome): Diarrhea Hair loss Joint pain Loss of appetite Muscle pain Nausea Vomiting This list may not describe all possible side effects. Call your doctor for medical advice about side effects. You may report side effects to FDA at  1-800-FDA-1088. Where should I keep my medication? This medication is given in a hospital or clinic. It will not be stored at home. NOTE: This sheet is a summary. It may not cover all possible information. If you have questions about this medicine, talk to your doctor, pharmacist, or health care provider.  2024 Elsevier/Gold Standard (2021-12-04 00:00:00)

## 2023-04-25 NOTE — Assessment & Plan Note (Signed)
Grade 2, he prefers to hold of neuropathy treatments.  observation.

## 2023-04-25 NOTE — Assessment & Plan Note (Signed)
Stage IV esophageal adenocarcinoma, liver metastatic disease, HER 2 negative. KRAS G12V, TMB 5.3, MSI Stable.  CPS 65%  1st line  FOLFOX , palliative RT --> 12/2022 CT progression--> switch to 5-FU + Nivolumab Q2 weeks  --> 02/2023 PET progression- pneumonitis--> 3rd line  Taxol and Cyramza  Labs are reviewed and discussed with patient. Tolerates chemotherapy well.  Proceed with C2 Taxol and cyramza

## 2023-04-25 NOTE — Assessment & Plan Note (Addendum)
Lab Results  Component Value Date   HGB 10.0 (L) 04/25/2023   TIBC 353 04/25/2023   IRONPCTSAT 35 04/25/2023   FERRITIN 357 (H) 04/25/2023  Reticulocyte hemoglobin has improved, Hb slightly improved. . Ferritin maybe falsely elevated . Anemia is likely due to chemotherapy and possible iron deficiency.  Continue oral iron supplementation.

## 2023-04-25 NOTE — Assessment & Plan Note (Signed)
Chemotherapy plan as listed above 

## 2023-04-25 NOTE — Progress Notes (Signed)
Hematology/Oncology Progress note [phone] [phone]        REFERRING PROVIDER: Rickard Patience, MD  CHIEF COMPLAINTS/PURPOSE OF CONSULTATION:  Stage IV Esophageal adenocarcinoma  ASSESSMENT & PLAN:   Cancer Staging  Adenocarcinoma of esophagus [facility]) Staging form: Esophagus - Adenocarcinoma, AJCC 8th Edition - Clinical stage from 08/28/2022: Stage IVB (cT3, cN1, cM1) - Signed by Rickard Patience, MD on 10/04/2022   IDA (iron deficiency anemia) Lab Results  Component Value Date   HGB 10.0 (L) 04/25/2023   TIBC 353 04/25/2023   IRONPCTSAT 35 04/25/2023   FERRITIN 357 (H) 04/25/2023  Reticulocyte hemoglobin has improved, Hb slightly improved. . Ferritin maybe falsely elevated . Anemia is likely due to chemotherapy and possible iron deficiency.  Continue oral iron supplementation.   Hypokalemia Chronic hypokalemia Cotinue KCL to BID. Marland Kitchen      Deep venous thrombosis (HCC) decrease to 2.5mg  BID, new Rx sent to pharmacy  Encounter for antineoplastic chemotherapy Chemotherapy plan as listed above.   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  Symptoms are improved, currently on prednisone 40mg  daily Conitnue 40mg  daily x  1week, followed by 30mg  for 2 weeks, followed by 20mg  until his next visit.   Adenocarcinoma of esophagus (HCC) Stage IV esophageal adenocarcinoma, liver metastatic disease, HER 2 negative. KRAS G12V, TMB 5.3, MSI Stable.  CPS 65%  1st line  FOLFOX , palliative RT --> 12/2022 CT progression--> switch to 5-FU + Nivolumab Q2 weeks  --> 02/2023 PET progression- pneumonitis--> 3rd line  Taxol and Cyramza  Labs are reviewed and discussed with patient. Tolerates chemotherapy well.  Proceed with C2 Taxol and cyramza  Chemotherapy-induced neuropathy (HCC) Grade 2, he prefers to hold of neuropathy treatments.  observation.    No orders of the defined types were placed in this encounter.   Follow-up 4 weeks   All questions were answered.  The patient knows to call the clinic with any problems, questions or concerns.  Rickard Patience, MD, PhD [facility] Hematology Oncology 04/25/2023   HISTORY OF PRESENTING ILLNESS:  Jonathan [name] 67 y.o. male presents to establish care for esophageal adenocarcinoma I have reviewed his chart and materials related to his cancer extensively and collaborated history with the patient. Summary of oncologic history is as follows: Oncology History  Adenocarcinoma of esophagus (HCC)  08/28/2022 Initial Diagnosis   Adenocarcinoma of esophagus   -Patient has noticed worsening of "food stuck/fullness" sensation since November 2023.  Patient had a barium swallow study which commented on marked mucosal irregularity in the distal esophagus with Broaddus base mural filling defect highly suspicious for malignancy.  Patient establish care with gastroenterology. -08/23/2022, EGD showed gastritis and partially obstructing malignant esophageal tumor in the lower third of the esophagus. Esophagus mass biopsy showed adenocarcinoma.  PD-L1 TPS 65%, HER2 negative.  [facility] NGS showed KRASG12V, CDKN2A, ARID1A, TP53, TMB 5.3, MSI stable.  Stomach biopsy showed gastric mucosa with no specific histology abnormality.  No significant intestinal metaplastic, dysplastic, granular atrophy or increased inflammation.     08/28/2022 Imaging   CT chest abdomen pelvis with contrast showed 1. Distal esophageal primary with gastrohepatic ligament nodal metastasis. 2. 2 right-sided pulmonary nodules, the largest of which measures 5 mm and is new since 2015. Pulmonary metastasis not be excluded. 3. Anterior right lower lobe volume loss and minimal soft tissue density, favoring atelectasis or scar. Recommend attention on follow-up. 4. Hepatic steatosis 5. Cholelithiasis 6. Left nephrolithiasis 7. Coronary artery atherosclerosis. Aortic Atherosclerosis   08/28/2022 Cancer Staging   Staging form:  Esophagus - Adenocarcinoma, AJCC 8th  Edition - Clinical stage from 08/28/2022: Stage IVB (cT3, cN1, cM1) - Signed by Rickard Patience, MD on 10/04/2022 Stage prefix: Initial diagnosis   09/06/2022 Imaging   PET scan showed 1. Esophageal primary with gastrohepatic ligament nodal metastasis,as on CT. 2. Focus of hypermetabolism which is favored to registered to the posterior hepatic dome, in the region of subtle heterogeneity on prior diagnostic CT. Suboptimally evaluated secondary to underlying steatosis. Recommend further evaluation with pre and post contrast abdominal MRI to confirm probable metastasis. 3. Incidental findings, including: Left nephrolithiasis.Cholelithiasis. Coronary artery atherosclerosis. Aortic Atherosclerosis    09/14/2022 - 09/14/2022 Chemotherapy   Patient is on Treatment Plan : ESOPHAGUS Carboplatin + Paclitaxel Weekly X 6 Weeks with XRT     09/23/2022 Imaging   MRI abdomen with and without contrast showed 1. Mildly T2 hyperintense segment VII hepatic lesion measuring 2.7 cm with imaging characteristics compatible with metastatic disease. 2. Tiny focus of delayed enhancement in the inferior right lobe of the liver segment VI measuring 8 mm with ill-defined increased T2 signal and subtle corresponding reduced diffusivity, also suspicious for metastatic disease. 3. Partially visualized distal esophageal wall thickening compatible with the patient's known primary neoplasm. 4. Similar size of the 11 mm gastrohepatic ligament lymph node mildly metabolic on prior PET-CT and compatible with local nodal disease involvement. 5. 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 Recommend follow up pre and post-contrast MRI/MRCP in 1 year. 6. Diffuse hepatic steatosis.   10/10/2022 Procedure   LIVER MASS; CT-GUIDED BIOPSY:  - MODERATE TO POORLY DIFFERENTIATED ADENOCARCINOMA MORPHOLOGICALLY  CONSISTENT WITH METASTASIS FROM PATIENT'S KNOWN ESOPHAGEAL   ADENOCARCINOMA.    10/17/2022 Procedure   Medi port placed by Dr. Wyn Quaker   10/21/2022 - 01/15/2023 Chemotherapy   Patient is on Treatment Plan : ESOPHAGEAL ADENOCARCINOMA FOLFOX q14d x 6 cycles     01/15/2023 Imaging   PET showed  1. Interval progression in metastatic esophageal carcinoma as evidenced by hypermetabolic lymph nodes in the neck, chest, abdomen and pelvis, an enlarging right hepatic lobe metastasis, new bilateral adrenal metastases and new osseous metastases. 2. Cholelithiasis. 3. Left renal stones. 4. Aortic atherosclerosis (ICD10-I70.0). Coronary artery calcification.    01/27/2023 - 03/14/2023 Chemotherapy   Patient is on Treatment Plan : GASTROESOPHAGEAL FOLFOX + Nivolumab q14d     03/20/2023 Imaging   CT chest abdomen pelvis w contrast showed 1. Interval progression of metastatic disease with multiple new and enlarged hypodense lesions throughout the liver. 2. Significant enlargement of bilateral adrenal metastases. 3. Subtle sclerosis of an osseous metastasis of the right femoral neck. Other previously FDG avid osseous metastases of the left femoral neck and right sixth rib not appreciated by CT. 4. No persistently enlarged lymph nodes. 5. Interval development of bandlike consolidation and fibrosis of the paramedian right lung, particularly of the right lower lobe, as well as additional scattered irregular opacities throughout the right lung. 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 6. Similar circumferential wall thickening throughout the mid to lower esophagus, consistent with known primary esophageal adenocarcinoma. 7. Nonobstructive bilateral nephrolithiasis. Aortic Atherosclerosis   03/27/2023 -  Chemotherapy   Patient is on Treatment Plan : GASTROESOPHAGEAL Ramucirumab D1, 15 + Paclitaxel D1,8,15 q28d     Patient presents to establish care.  He is not taking PPI. He has intentionally lost some weight. Family history  positive for father and paternal uncle  with prostate cancer and sister with breast cancer. Denies any routine alcohol use  Right interval jugular vein occlusive DVT, started on Eliquis starter package on 10/28/2022, swelling of neck has improved.   CXR showed Interval development of bilateral perihilar interstitial opacities  He was treated with Azithromycin and Doxycycline to cover atypical infections.   INTERVAL HISTORY Jonathan [name] is a 67 y.o. male who has above history reviewed by me today presents for follow up visit for Stage IV esophageal adenocarcinoma.  He started on prednisone 40mg  yesterday, cough has improved. Feels better.  He denies any SOB, abdominal pain.  + neuropathy of finger tips, toes/bottom of feet. "Rubbery"    MEDICAL HISTORY:  Past Medical History:  Diagnosis Date   Arthritis    Complication of anesthesia    OCCURRED ONCE YEARS AGO 1981   Esophageal mass    Hypertension    Hypothyroidism    PONV (postoperative nausea and vomiting)     SURGICAL HISTORY: Past Surgical History:  Procedure Laterality Date   COLONOSCOPY     ESOPHAGOGASTRODUODENOSCOPY N/A 08/23/2022   Procedure: ESOPHAGOGASTRODUODENOSCOPY (EGD);  Surgeon: Jaynie Collins, DO;  Location: [facility] ENDOSCOPY;  Service: Gastroenterology;  Laterality: N/A;   EUS N/A 09/12/2022   Procedure: FULL UPPER ENDOSCOPIC ULTRASOUND (EUS) RADIAL;  Surgeon: Bearl Mulberry, MD;  Location: [facility] ENDOSCOPY;  Service: Gastroenterology;  Laterality: N/A;   FINGER SURGERY     PORTA CATH INSERTION N/A 10/17/2022   Procedure: PORTA CATH INSERTION;  Surgeon: Annice Needy, MD;  Location: [facility] INVASIVE CV LAB;  Service: Cardiovascular;  Laterality: N/A;   TENDON REPAIR IN LEFT KNEE      SOCIAL HISTORY: Social History   Socioeconomic History   Marital status: Single    Spouse name: Not on file   Number of children: Not on file   Years of education: Not on file   Highest education level: Not on file   Occupational History   Not on file  Tobacco Use   Smoking status: Never   Smokeless tobacco: Never  Vaping Use   Vaping status: Never Used  Substance and Sexual Activity   Alcohol use: Yes    Comment: OCCASIONALLY   Drug use: Never   Sexual activity: Not on file  Other Topics Concern   Not on file  Social History Narrative   Not on file   Social Determinants of Health   Financial Resource Strain: Low Risk  (10/29/2022)   Received from [facility], [facility]   Overall Financial Resource Strain (CARDIA)    Difficulty of Paying Living Expenses: Not hard at all  Food Insecurity: No Food Insecurity (10/29/2022)   Received from [facility], [facility]   Hunger Vital Sign    Worried About Running Out of Food in the Last Year: Never true    Ran Out of Food in the Last Year: Never true  Transportation Needs: No Transportation Needs (10/29/2022)   Received from [facility], [facility]   [facility] - Transportation    In the past 12 months, has lack of transportation kept you from medical appointments or from getting medications?: No    Lack of Transportation (Non-Medical): No  Physical Activity: Not on file  Stress: Not on file  Social Connections: Not on file  Intimate Partner Violence: Not At Risk (08/28/2022)   Humiliation, Afraid, Rape, and Kick questionnaire    Fear of Current or Ex-Partner:  No    Emotionally Abused: No    Physically Abused: No    Sexually Abused: No    FAMILY HISTORY: Family History  Problem Relation Age of Onset   Heart attack Mother    Prostate cancer Father    Breast cancer Sister    Prostate cancer Paternal Uncle     ALLERGIES:  has No Known Allergies.  MEDICATIONS:  Current Outpatient Medications  Medication Sig Dispense Refill   acetaminophen (TYLENOL) 650 MG CR tablet Take 1,300 mg by mouth every 8 (eight) hours as needed for pain.      amLODipine (NORVASC) 10 MG tablet Take 10 mg by mouth daily.     apixaban (ELIQUIS) 2.5 MG TABS tablet Take 1 tablet (2.5 mg total) by mouth 2 (two) times daily. 60 tablet 2   atenolol (TENORMIN) 50 MG tablet Take 50 mg by mouth daily.     azelastine (ASTELIN) 0.1 % nasal spray Place 1 spray into both nostrils 2 (two) times daily. Use in each nostril as directed 30 mL 1   benzonatate (TESSALON) 200 MG capsule Take 1 capsule (200 mg total) by mouth 3 (three) times daily as needed for cough. 20 capsule 5   chlorhexidine (PERIDEX) 0.12 % solution USE AS DIRECTED TAKE  15  ML  IN  THE  MOUTH  OR  THROAT  TWICE  DAILY 473 mL 0   Cholecalciferol (VITAMIN D3) 125 MCG (5000 UT) CAPS Take 5,000 Units by mouth daily.     hydrochlorothiazide (HYDRODIURIL) 25 MG tablet Take 25 mg by mouth daily.     Iron-Vitamin C 65-125 MG TABS Take 1 tablet by mouth daily. 30 tablet 0   levothyroxine (SYNTHROID) 125 MCG tablet Take 125 mcg by mouth every morning.     Multiple Vitamins-Minerals (MULTIVITAMIN WITH MINERALS) tablet Take 1 tablet by mouth daily.     omeprazole (PRILOSEC) 20 MG capsule Take 20 mg by mouth daily.     ondansetron (ZOFRAN) 8 MG tablet Take 1 tablet (8 mg total) by mouth every 8 (eight) hours as needed for nausea or vomiting. Start on the third day after chemotherapy.     potassium chloride SA (KLOR-CON M) 20 MEQ tablet Take 2 tablets (40 mEq total) by mouth 2 (two) times daily. 120 tablet 0   predniSONE (DELTASONE) 10 MG tablet Take 1 tablet (10 mg total) by mouth daily with breakfast. Take with your 20mg  tablets. Take 30mg  daily for 2 weeks. 14 tablet 0   predniSONE (DELTASONE) 20 MG tablet Take 2 tablets (40 mg total) by mouth daily with breakfast. 60 tablet 0   prochlorperazine (COMPAZINE) 10 MG tablet Take 1 tablet (10 mg total) by mouth every 6 (six) hours as needed for nausea or vomiting.     cetirizine (ZYRTEC ALLERGY) 10 MG tablet Take 1 tablet (10 mg total) by mouth at bedtime. (Patient not  taking: Reported on 03/27/2023) 30 tablet 11   hydrocortisone-nystatin-lidocaine in diphenhydrAMINE liquid Swish and swallow 5 mLs by mouth 3 (three) times daily as needed for mouth pain. (Patient not taking: Reported on 04/11/2023) 200 mL 2   No current facility-administered medications for this visit.    Review of Systems  Constitutional:  Negative for appetite change, chills, fatigue and fever.  HENT:   Negative for hearing loss and voice change.        Nasal congestion, postnasal drip.   Eyes:  Negative for eye problems and icterus.  Respiratory:  Negative for chest tightness, cough and  shortness of breath.   Cardiovascular:  Negative for chest pain and leg swelling.  Gastrointestinal:  Negative for abdominal distention, abdominal pain, nausea and vomiting.  Endocrine: Negative for hot flashes.  Genitourinary:  Negative for difficulty urinating, dysuria and frequency.   Musculoskeletal:  Negative for arthralgias.  Skin:  Negative for itching and rash.  Neurological:  Positive for numbness. Negative for light-headedness.  Hematological:  Negative for adenopathy. Does not bruise/bleed easily.  Psychiatric/Behavioral:  Negative for confusion.      PHYSICAL EXAMINATION: ECOG PERFORMANCE STATUS: 0 - Asymptomatic  Vitals:   04/25/23 0842 04/25/23 0855  BP: (!) 135/90 126/87  Pulse: 70   Resp: 18   Temp: (!) 96.1 F (35.6 C)   SpO2: 100%    Filed Weights   04/25/23 0842  Weight: 217 lb 14.4 oz (98.8 kg)    Physical Exam Constitutional:      General: He is not in acute distress.    Appearance: He is obese. He is not diaphoretic.  HENT:     Head: Normocephalic and atraumatic.  Eyes:     General: No scleral icterus.    Pupils: Pupils are equal, round, and reactive to light.  Cardiovascular:     Rate and Rhythm: Normal rate and regular rhythm.  Pulmonary:     Effort: Pulmonary effort is normal. No respiratory distress.     Breath sounds: No wheezing.  Abdominal:      General: There is no distension.     Palpations: Abdomen is soft.  Musculoskeletal:        General: Normal range of motion.     Cervical back: Normal range of motion and neck supple.  Skin:    General: Skin is warm and dry.     Findings: No erythema.  Neurological:     Mental Status: He is alert and oriented to person, place, and time. Mental status is at baseline.     Cranial Nerves: No cranial nerve deficit.     Motor: No abnormal muscle tone.     Coordination: Coordination normal.  Psychiatric:        Mood and Affect: Mood and affect normal.      LABORATORY DATA:  I have reviewed the data as listed    Latest Ref Rng & Units 04/25/2023    8:25 AM 04/11/2023    8:26 AM 04/04/2023    8:11 AM  CBC  WBC 4.0 - 10.5 K/uL 7.4  7.1  9.1   Hemoglobin 13.0 - 17.0 g/dL 84.6  9.8  96.2   Hematocrit 39.0 - 52.0 % 33.2  32.3  33.0   Platelets 150 - 400 K/uL 206  286  309       Latest Ref Rng & Units 04/25/2023    8:25 AM 04/11/2023    8:26 AM 04/04/2023    8:11 AM  CMP  Glucose 70 - 99 mg/dL 96  952  841   BUN 8 - 23 mg/dL 18  17  17    Creatinine 0.61 - 1.24 mg/dL 3.24  4.01  0.27   Sodium 135 - 145 mmol/L 138  136  138   Potassium 3.5 - 5.1 mmol/L 3.9  3.2  3.5   Chloride 98 - 111 mmol/L 104  104  104   CO2 22 - 32 mmol/L 25  26  25    Calcium 8.9 - 10.3 mg/dL 9.5  8.9  8.7   Total Protein 6.5 - 8.1 g/dL 6.9  6.9  6.8  Total Bilirubin 0.3 - 1.2 mg/dL 0.5  0.6  0.4   Alkaline Phos 38 - 126 U/L 57  74  82   AST 15 - 41 U/L 20  27  29    ALT 0 - 44 U/L 32  37  38      RADIOGRAPHIC STUDIES: I have personally reviewed the radiological images as listed and agreed with the findings in the report. No results found.

## 2023-04-29 ENCOUNTER — Other Ambulatory Visit: Payer: Self-pay | Admitting: Oncology

## 2023-04-29 DIAGNOSIS — M9903 Segmental and somatic dysfunction of lumbar region: Secondary | ICD-10-CM | POA: Diagnosis not present

## 2023-04-29 DIAGNOSIS — M62830 Muscle spasm of back: Secondary | ICD-10-CM | POA: Diagnosis not present

## 2023-04-29 DIAGNOSIS — M9902 Segmental and somatic dysfunction of thoracic region: Secondary | ICD-10-CM | POA: Diagnosis not present

## 2023-04-29 DIAGNOSIS — C159 Malignant neoplasm of esophagus, unspecified: Secondary | ICD-10-CM

## 2023-04-29 DIAGNOSIS — M9901 Segmental and somatic dysfunction of cervical region: Secondary | ICD-10-CM | POA: Diagnosis not present

## 2023-05-01 ENCOUNTER — Other Ambulatory Visit: Payer: Self-pay | Admitting: Oncology

## 2023-05-01 DIAGNOSIS — G620 Drug-induced polyneuropathy: Secondary | ICD-10-CM | POA: Diagnosis not present

## 2023-05-01 DIAGNOSIS — D701 Agranulocytosis secondary to cancer chemotherapy: Secondary | ICD-10-CM | POA: Diagnosis not present

## 2023-05-01 DIAGNOSIS — T451X5A Adverse effect of antineoplastic and immunosuppressive drugs, initial encounter: Secondary | ICD-10-CM | POA: Diagnosis not present

## 2023-05-01 DIAGNOSIS — D509 Iron deficiency anemia, unspecified: Secondary | ICD-10-CM | POA: Diagnosis not present

## 2023-05-01 DIAGNOSIS — C159 Malignant neoplasm of esophagus, unspecified: Secondary | ICD-10-CM | POA: Diagnosis not present

## 2023-05-01 DIAGNOSIS — I10 Essential (primary) hypertension: Secondary | ICD-10-CM | POA: Insufficient documentation

## 2023-05-01 DIAGNOSIS — G4733 Obstructive sleep apnea (adult) (pediatric): Secondary | ICD-10-CM | POA: Diagnosis not present

## 2023-05-01 DIAGNOSIS — E039 Hypothyroidism, unspecified: Secondary | ICD-10-CM | POA: Diagnosis not present

## 2023-05-01 MED FILL — Dexamethasone Sodium Phosphate Inj 100 MG/10ML: INTRAMUSCULAR | Qty: 1 | Status: AC

## 2023-05-02 ENCOUNTER — Other Ambulatory Visit: Payer: Self-pay

## 2023-05-02 ENCOUNTER — Inpatient Hospital Stay: Payer: No Typology Code available for payment source

## 2023-05-02 ENCOUNTER — Telehealth: Payer: Self-pay | Admitting: *Deleted

## 2023-05-02 ENCOUNTER — Inpatient Hospital Stay
Admission: EM | Admit: 2023-05-02 | Discharge: 2023-05-08 | DRG: 374 | Disposition: A | Discharge status: Home / Self Care | Payer: No Typology Code available for payment source | Attending: Internal Medicine | Admitting: Internal Medicine

## 2023-05-02 ENCOUNTER — Emergency Department: Payer: No Typology Code available for payment source

## 2023-05-02 DIAGNOSIS — I10 Essential (primary) hypertension: Secondary | ICD-10-CM | POA: Diagnosis present

## 2023-05-02 DIAGNOSIS — D84821 Immunodeficiency due to drugs: Secondary | ICD-10-CM | POA: Diagnosis present

## 2023-05-02 DIAGNOSIS — K921 Melena: Secondary | ICD-10-CM | POA: Diagnosis not present

## 2023-05-02 DIAGNOSIS — K8020 Calculus of gallbladder without cholecystitis without obstruction: Secondary | ICD-10-CM | POA: Diagnosis not present

## 2023-05-02 DIAGNOSIS — K920 Hematemesis: Secondary | ICD-10-CM | POA: Diagnosis not present

## 2023-05-02 DIAGNOSIS — R1110 Vomiting, unspecified: Secondary | ICD-10-CM | POA: Diagnosis not present

## 2023-05-02 DIAGNOSIS — Z79899 Other long term (current) drug therapy: Secondary | ICD-10-CM | POA: Diagnosis not present

## 2023-05-02 DIAGNOSIS — E876 Hypokalemia: Secondary | ICD-10-CM | POA: Diagnosis not present

## 2023-05-02 DIAGNOSIS — K2081 Other esophagitis with bleeding: Secondary | ICD-10-CM | POA: Diagnosis not present

## 2023-05-02 DIAGNOSIS — Z8249 Family history of ischemic heart disease and other diseases of the circulatory system: Secondary | ICD-10-CM | POA: Diagnosis not present

## 2023-05-02 DIAGNOSIS — K2970 Gastritis, unspecified, without bleeding: Secondary | ICD-10-CM | POA: Diagnosis not present

## 2023-05-02 DIAGNOSIS — Z79890 Hormone replacement therapy (postmenopausal): Secondary | ICD-10-CM

## 2023-05-02 DIAGNOSIS — Z803 Family history of malignant neoplasm of breast: Secondary | ICD-10-CM | POA: Diagnosis not present

## 2023-05-02 DIAGNOSIS — Z7901 Long term (current) use of anticoagulants: Secondary | ICD-10-CM

## 2023-05-02 DIAGNOSIS — T451X5A Adverse effect of antineoplastic and immunosuppressive drugs, initial encounter: Secondary | ICD-10-CM | POA: Diagnosis not present

## 2023-05-02 DIAGNOSIS — D649 Anemia, unspecified: Secondary | ICD-10-CM

## 2023-05-02 DIAGNOSIS — Z8042 Family history of malignant neoplasm of prostate: Secondary | ICD-10-CM

## 2023-05-02 DIAGNOSIS — C159 Malignant neoplasm of esophagus, unspecified: Secondary | ICD-10-CM | POA: Diagnosis not present

## 2023-05-02 DIAGNOSIS — D62 Acute posthemorrhagic anemia: Secondary | ICD-10-CM | POA: Diagnosis not present

## 2023-05-02 DIAGNOSIS — Z6832 Body mass index (BMI) 32.0-32.9, adult: Secondary | ICD-10-CM

## 2023-05-02 DIAGNOSIS — J984 Other disorders of lung: Secondary | ICD-10-CM | POA: Diagnosis not present

## 2023-05-02 DIAGNOSIS — Z923 Personal history of irradiation: Secondary | ICD-10-CM

## 2023-05-02 DIAGNOSIS — Z8501 Personal history of malignant neoplasm of esophagus: Secondary | ICD-10-CM | POA: Diagnosis present

## 2023-05-02 DIAGNOSIS — I82621 Acute embolism and thrombosis of deep veins of right upper extremity: Secondary | ICD-10-CM

## 2023-05-02 DIAGNOSIS — D500 Iron deficiency anemia secondary to blood loss (chronic): Secondary | ICD-10-CM

## 2023-05-02 DIAGNOSIS — E669 Obesity, unspecified: Secondary | ICD-10-CM | POA: Diagnosis present

## 2023-05-02 DIAGNOSIS — K2210 Ulcer of esophagus without bleeding: Secondary | ICD-10-CM | POA: Diagnosis not present

## 2023-05-02 DIAGNOSIS — R0989 Other specified symptoms and signs involving the circulatory and respiratory systems: Secondary | ICD-10-CM | POA: Diagnosis not present

## 2023-05-02 DIAGNOSIS — R918 Other nonspecific abnormal finding of lung field: Secondary | ICD-10-CM | POA: Diagnosis not present

## 2023-05-02 DIAGNOSIS — I2510 Atherosclerotic heart disease of native coronary artery without angina pectoris: Secondary | ICD-10-CM | POA: Diagnosis not present

## 2023-05-02 DIAGNOSIS — C787 Secondary malignant neoplasm of liver and intrahepatic bile duct: Secondary | ICD-10-CM | POA: Diagnosis present

## 2023-05-02 DIAGNOSIS — G4733 Obstructive sleep apnea (adult) (pediatric): Secondary | ICD-10-CM | POA: Diagnosis not present

## 2023-05-02 DIAGNOSIS — R1013 Epigastric pain: Secondary | ICD-10-CM | POA: Diagnosis not present

## 2023-05-02 DIAGNOSIS — I82409 Acute embolism and thrombosis of unspecified deep veins of unspecified lower extremity: Secondary | ICD-10-CM | POA: Diagnosis present

## 2023-05-02 DIAGNOSIS — T66XXXA Radiation sickness, unspecified, initial encounter: Secondary | ICD-10-CM | POA: Diagnosis not present

## 2023-05-02 DIAGNOSIS — E039 Hypothyroidism, unspecified: Secondary | ICD-10-CM | POA: Diagnosis present

## 2023-05-02 DIAGNOSIS — Y842 Radiological procedure and radiotherapy as the cause of abnormal reaction of the patient, or of later complication, without mention of misadventure at the time of the procedure: Secondary | ICD-10-CM | POA: Diagnosis present

## 2023-05-02 DIAGNOSIS — R109 Unspecified abdominal pain: Secondary | ICD-10-CM | POA: Diagnosis not present

## 2023-05-02 DIAGNOSIS — Z86718 Personal history of other venous thrombosis and embolism: Secondary | ICD-10-CM

## 2023-05-02 DIAGNOSIS — K922 Gastrointestinal hemorrhage, unspecified: Secondary | ICD-10-CM | POA: Diagnosis not present

## 2023-05-02 DIAGNOSIS — R0789 Other chest pain: Secondary | ICD-10-CM | POA: Diagnosis not present

## 2023-05-02 LAB — BASIC METABOLIC PANEL
Anion gap: 7 (ref 5–15)
BUN: 22 mg/dL (ref 8–23)
CO2: 27 mmol/L (ref 22–32)
Calcium: 8.9 mg/dL (ref 8.9–10.3)
Chloride: 101 mmol/L (ref 98–111)
Creatinine, Ser: 0.91 mg/dL (ref 0.61–1.24)
GFR, Estimated: >60 mL/min (ref >60)
Glucose, Bld: 107 mg/dL — ABNORMAL HIGH (ref 70–99)
Potassium: 4 mmol/L (ref 3.5–5.1)
Sodium: 135 mmol/L (ref 135–145)

## 2023-05-02 LAB — CBC
HCT: 29.3 % — ABNORMAL LOW (ref 39.0–52.0)
Hemoglobin: 9.3 g/dL — ABNORMAL LOW (ref 13.0–17.0)
MCH: 27.4 pg (ref 26.0–34.0)
MCHC: 31.7 g/dL (ref 30.0–36.0)
MCV: 86.4 fL (ref 80.0–100.0)
Platelets: 246 10*3/uL (ref 150–400)
RBC: 3.39 MIL/uL — ABNORMAL LOW (ref 4.22–5.81)
RDW: 20 % — ABNORMAL HIGH (ref 11.5–15.5)
WBC: 10.1 10*3/uL (ref 4.0–10.5)
nRBC: 0.9 % — ABNORMAL HIGH (ref 0.0–0.2)

## 2023-05-02 LAB — HEPATIC FUNCTION PANEL
ALT: 33 U/L (ref 0–44)
AST: 22 U/L (ref 15–41)
Albumin: 3.4 g/dL — ABNORMAL LOW (ref 3.5–5.0)
Alkaline Phosphatase: 46 U/L (ref 38–126)
Bilirubin, Direct: 0.1 mg/dL (ref 0.0–0.2)
Indirect Bilirubin: 0.7 mg/dL (ref 0.3–0.9)
Total Bilirubin: 0.8 mg/dL (ref 0.3–1.2)
Total Protein: 6.8 g/dL (ref 6.5–8.1)

## 2023-05-02 LAB — TROPONIN I (HIGH SENSITIVITY): Troponin I (High Sensitivity): 7 ng/L (ref <18)

## 2023-05-02 LAB — HEMOGLOBIN: Hemoglobin: 9.2 g/dL — ABNORMAL LOW (ref 13.0–17.0)

## 2023-05-02 MED ORDER — IOHEXOL 350 MG/ML SOLN
75.0000 mL | Freq: Once | INTRAVENOUS | Status: AC | PRN
  Administered 2023-05-02: 75 mL via INTRAVENOUS

## 2023-05-02 MED ORDER — PANTOPRAZOLE SODIUM 40 MG IV SOLR
40.0000 mg | Freq: Two times a day (BID) | INTRAVENOUS | Status: DC
  Administered 2023-05-02 – 2023-05-08 (×12): 40 mg via INTRAVENOUS
  Filled 2023-05-02 (×12): qty 10

## 2023-05-02 MED ORDER — VITAMIN D 25 MCG (1000 UNIT) PO TABS
5000.0000 [IU] | ORAL_TABLET | Freq: Every day | ORAL | Status: DC
  Administered 2023-05-03 – 2023-05-08 (×6): 5000 [IU] via ORAL
  Filled 2023-05-02 (×7): qty 5

## 2023-05-02 MED ORDER — LIDOCAINE VISCOUS HCL 2 % MT SOLN
15.0000 mL | OROMUCOSAL | Status: DC | PRN
  Administered 2023-05-07: 15 mL via OROMUCOSAL
  Filled 2023-05-02: qty 15

## 2023-05-02 MED ORDER — LEVOTHYROXINE SODIUM 50 MCG PO TABS
125.0000 ug | ORAL_TABLET | Freq: Every morning | ORAL | Status: DC
  Administered 2023-05-03 – 2023-05-08 (×6): 125 ug via ORAL
  Filled 2023-05-02 (×7): qty 1

## 2023-05-02 MED ORDER — ACETAMINOPHEN 325 MG PO TABS
975.0000 mg | ORAL_TABLET | Freq: Three times a day (TID) | ORAL | Status: DC | PRN
  Administered 2023-05-03 – 2023-05-07 (×3): 975 mg via ORAL
  Filled 2023-05-02 (×4): qty 3

## 2023-05-02 MED ORDER — ATENOLOL 25 MG PO TABS
50.0000 mg | ORAL_TABLET | Freq: Every day | ORAL | Status: DC
  Administered 2023-05-03 – 2023-05-06 (×4): 50 mg via ORAL
  Filled 2023-05-02 (×6): qty 2

## 2023-05-02 NOTE — Telephone Encounter (Signed)
Returned call to patient and instructed him to go to the Emergency room.  Pt verbalized understanding. Pt was concerned about lab and infusion appointments today.  RN stated she would let the staff and MD know and someone would call to reschedule.

## 2023-05-02 NOTE — ED Triage Notes (Signed)
Pt comes with c/o vomiting this morning. Pt states he has esophageal cancer. Pt states he has been having some pain in that area. Pt states he was suppose to have chemo today but they canceled and told him to come here.   Pt states tightness in chest and epigastric area.

## 2023-05-02 NOTE — H&P (Addendum)
History and Physical    Patient: Jonathan [name] AVW:[id number] DOB: Dec 14, 1955 DOA: 05/02/2023 DOS: the patient was seen and examined on 05/02/2023 PCP: [name], MD  Patient coming from: Home  Chief Complaint:  Chief Complaint  Patient presents with   Emesis   HPI: EVARISTO SPENNER is a 67 y.o. male with medical history significant of esophageal cancer, hypothyroidism, essential hypertension, history of DVT on Eliquis, who present to the hospital with coffee-ground emesis. Patient was receiving chemotherapy for esophageal cancer, doing well.  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  He started have some stomach discomfort for the past week, but no nausea vomiting abdominal pain.  He feels nauseous, vomiting up some coffee-ground emesis today.  He had 2 episodes of vomiting, without blood clots.  He had a normal looking bowel movement today.  He did not feel dizzy or lightheaded. Chronically, patient has been losing weight. Review of Systems: As mentioned in the history of present illness. All other systems reviewed and are negative. Past Medical History:  Diagnosis Date   Arthritis    Complication of anesthesia    OCCURRED ONCE YEARS AGO 1981   Esophageal mass    Hypertension    Hypothyroidism    PONV (postoperative nausea and vomiting)    Past Surgical History:  Procedure Laterality Date   COLONOSCOPY     ESOPHAGOGASTRODUODENOSCOPY N/A 08/23/2022   Procedure: ESOPHAGOGASTRODUODENOSCOPY (EGD);  Surgeon: Jaynie Collins, DO;  Location: [facility] ENDOSCOPY;  Service: Gastroenterology;  Laterality: N/A;   EUS N/A 09/12/2022   Procedure: FULL UPPER ENDOSCOPIC ULTRASOUND (EUS) RADIAL;  Surgeon: Bearl Mulberry, MD;  Location: [facility] ENDOSCOPY;  Service: Gastroenterology;  Laterality: N/A;   FINGER SURGERY     PORTA CATH INSERTION N/A 10/17/2022   Procedure: PORTA CATH INSERTION;  Surgeon: Annice Needy, MD;  Location: [facility] INVASIVE CV LAB;  Service:  Cardiovascular;  Laterality: N/A;   TENDON REPAIR IN LEFT KNEE     Social History:  reports that he has never smoked. He has never used smokeless tobacco. He reports current alcohol use. He reports that he does not use drugs.  No Known Allergies  Family History  Problem Relation Age of Onset   Heart attack Mother    Prostate cancer Father    Breast cancer Sister    Prostate cancer Paternal Uncle     Prior to Admission medications   Medication Sig Start Date End Date Taking? Authorizing Provider  acetaminophen (TYLENOL) 650 MG CR tablet Take 1,300 mg by mouth every 8 (eight) hours as needed for pain.    [provider]  amLODipine (NORVASC) 10 MG tablet Take 10 mg by mouth daily. 11/22/22   [provider]  apixaban (ELIQUIS) 2.5 MG TABS tablet Take 1 tablet (2.5 mg total) by mouth 2 (two) times daily. 04/25/23   Rickard Patience, MD  atenolol (TENORMIN) 50 MG tablet Take 50 mg by mouth daily.    [provider]  azelastine (ASTELIN) 0.1 % nasal spray Place 1 spray into both nostrils 2 (two) times daily. Use in each nostril as directed 02/12/23   Rickard Patience, MD  benzonatate (TESSALON) 200 MG capsule Take 1 capsule (200 mg total) by mouth 3 (three) times daily as needed for cough. 02/19/23   Rushie Chestnut, PA-C  cetirizine (ZYRTEC ALLERGY) 10 MG tablet Take 1 tablet (10 mg total) by mouth at bedtime. Patient not taking: Reported on 03/27/2023 02/19/23 02/19/24  Rushie Chestnut, PA-C  chlorhexidine (PERIDEX) 0.12 % solution USE AS DIRECTED TAKE  15  ML  IN  THE  MOUTH  OR  THROAT  TWICE  DAILY 04/15/23   Rickard Patience, MD  Cholecalciferol (VITAMIN D3) 125 MCG (5000 UT) CAPS Take 5,000 Units by mouth daily.    [provider]  hydrochlorothiazide (HYDRODIURIL) 25 MG tablet Take 25 mg by mouth daily. 06/09/22   [provider]  hydrocortisone-nystatin-lidocaine in diphenhydrAMINE liquid Swish and swallow 5 mLs by mouth 3 (three) times daily as needed for mouth  pain. Patient not taking: Reported on 04/11/2023 01/21/23   Borders, Daryl Eastern, NP  Iron-Vitamin C 65-125 MG TABS Take 1 tablet by mouth daily. 03/27/23   Rickard Patience, MD  levothyroxine (SYNTHROID) 125 MCG tablet Take 125 mcg by mouth every morning. 12/20/22   [provider]  Multiple Vitamins-Minerals (MULTIVITAMIN WITH MINERALS) tablet Take 1 tablet by mouth daily.    [provider]  omeprazole (PRILOSEC) 20 MG capsule Take 20 mg by mouth daily. 08/16/22 08/16/23  [provider]  ondansetron (ZOFRAN) 8 MG tablet Take 1 tablet (8 mg total) by mouth every 8 (eight) hours as needed for nausea or vomiting. Start on the third day after chemotherapy. 02/17/23   Rickard Patience, MD  potassium chloride SA (KLOR-CON M) 20 MEQ tablet Take 2 tablets (40 mEq total) by mouth 2 (two) times daily. 02/12/23   Rickard Patience, MD  predniSONE (DELTASONE) 10 MG tablet Take 1 tablet (10 mg total) by mouth daily with breakfast. Take with your 20mg  tablets. Take 30mg  daily for 2 weeks. 04/25/23   Rickard Patience, MD  predniSONE (DELTASONE) 20 MG tablet Take 2 tablets (40 mg total) by mouth daily with breakfast. 04/11/23   Rickard Patience, MD  prochlorperazine (COMPAZINE) 10 MG tablet Take 1 tablet (10 mg total) by mouth every 6 (six) hours as needed for nausea or vomiting. 02/17/23   Rickard Patience, MD    Physical Exam: Vitals:   05/02/23 1133 05/02/23 1133  BP: 116/79   Pulse: 71   Resp: 18   Temp: 98 F (36.7 C)   SpO2: 99%   Weight:  97.1 kg  Height:  5' 8.5" (1.74 m)   Physical Exam Constitutional:      General: He is not in acute distress.    Appearance: Normal appearance. He is not ill-appearing or toxic-appearing.  HENT:     Head: Normocephalic and atraumatic.     Nose: Nose normal.     Mouth/Throat:     Mouth: Mucous membranes are moist.     Pharynx: Oropharynx is clear.  Eyes:     Conjunctiva/sclera: Conjunctivae normal.     Pupils: Pupils are equal, round, and reactive to light.  Cardiovascular:     Rate  and Rhythm: Normal rate and regular rhythm.     Heart sounds: No murmur heard. Pulmonary:     Effort: Pulmonary effort is normal. No respiratory distress.     Breath sounds: Normal breath sounds. No wheezing or rales.  Abdominal:     General: Abdomen is flat. Bowel sounds are normal. There is no distension.     Palpations: Abdomen is soft.     Tenderness: There is no abdominal tenderness.  Musculoskeletal:        General: No swelling or tenderness. Normal range of motion.     Cervical back: Normal range of motion and neck supple. No rigidity.  Lymphadenopathy:     Cervical: No cervical adenopathy.  Skin:  General: Skin is warm and dry.     Coloration: Skin is not jaundiced.  Neurological:     General: No focal deficit present.     Mental Status: He is alert and oriented to person, place, and time.     Cranial Nerves: No cranial nerve deficit.  Psychiatric:        Mood and Affect: Mood normal.        Thought Content: Thought content normal.     Data Reviewed:  CT chest/abdomen showed esophageal narrowing. Anemia hemoglobin 9.3, recent iron levels normal.  Renal function within normal limits.  Assessment and Plan: Upper GI bleed with coffee-ground emesis. Esophageal cancer. Patient had some stomach discomfort for the last week, started to have nausea and coffee-ground emesis.  Possibility of gastritis versus peptic ulcer disease.  Will start Protonix 40 mg IV twice a day.   Another consideration is esophageal cancer bleeding. Hold off anticoagulation. Obtain GI consult to see if patient needs a EGD. As for his esophageal cancer, patient just finished a course of chemotherapy, she does not have any dysphagia.  She also completed the final day of steroid taper.  DVT with IV port thrombosis. Patient had an episode of IV port thrombosis in May 2024, will hold off anticoagulation due to active bleeding.  Chronic anemia. Likely due to chemotherapy, hemoglobin stable, iron level  was normal.  Essential hypertension Continue some home medicines  Hypothyroidism Continue Synthroid.     Advance Care Planning:   Code Status: Full Code patient is a full code.  Consults: GI  Family Communication: None  Severity of Illness: The appropriate patient status for this patient is OBSERVATION. Observation status is judged to be reasonable and necessary in order to provide the required intensity of service to ensure the patient's safety. The patient's presenting symptoms, physical exam findings, and initial radiographic and laboratory data in the context of their medical condition is felt to place them at decreased risk for further clinical deterioration. Furthermore, it is anticipated that the patient will be medically stable for discharge from the hospital within 2 midnights of admission.   Author: Marrion Coy, MD 05/02/2023 3:11 PM  For on call review [email or web address].

## 2023-05-02 NOTE — ED Notes (Signed)
This RN has called the unit secretary to have the patient put in for transit for him to go upstairs

## 2023-05-02 NOTE — Consult Note (Signed)
Inpatient Consultation   Patient ID: Jonathan [name] is a 67 y.o. male.  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 GI consulted for Upper GI bleeding.  Pt reports he has been feeling poorly over the last 1-2 weeks while receiving his chemotherapy. Notes some epigastric discomfort, but denies dysphagia and odynophagia. He had tow episodes of dark emesis today without hematemesis. He reports his BM have been brown without melena/hematochezia. No chest pain or SOB.  Hgb on presentation 9.3. Baseline as of late has been ~10. Trickling down as he receives chemotherapy over the last several months.  Last dose of eliquis this morning Denies NSAIDs, Anti-plt agents Denies family history of gastrointestinal disease and malignancy  Previous Endoscopies:EGD 07/2022 - dx with esophageal adeno ca Findings: The duodenal bulb, first portion of the duodenum and second portion of the duodenum were normal. Estimated blood loss: none.  Localized mild inflammation characterized by erosions was found in the gastric antrum. Biopsies were taken with a cold forceps for Helicobacter pylori testing. Estimated blood loss was minimal.  The exam of the stomach was otherwise normal.  A large, fungating and ulcerating mass with bleeding and no stigmata of recent bleeding was found in the lower third of the esophagus, 27 cm from the incisors. The mass was partially obstructing and circumferential. Biopsies were taken with a cold forceps for histology. Circumferential mass extending from 27 to 39 cm from incisors.  Esophagogastric  landmarks were identified: the gastroesophageal junction was found at 39 cm from the incisors.  Esophagus proximal to mass appears normal, Mass does not appear to invade the cardia  EUS- 08/2022    Past Medical History:  Diagnosis Date   Arthritis    Complication of anesthesia    OCCURRED ONCE YEARS AGO 1981   Esophageal mass    Hypertension    Hypothyroidism    PONV (postoperative nausea and vomiting)     Past Surgical History:  Procedure Laterality Date   COLONOSCOPY     ESOPHAGOGASTRODUODENOSCOPY N/A 08/23/2022   Procedure: ESOPHAGOGASTRODUODENOSCOPY (EGD);  Surgeon: Jaynie Collins, DO;  Location: [facility] ENDOSCOPY;  Service: Gastroenterology;  Laterality: N/A;   EUS N/A 09/12/2022   Procedure: FULL UPPER ENDOSCOPIC ULTRASOUND (EUS) RADIAL;  Surgeon: Bearl Mulberry, MD;  Location: [facility] ENDOSCOPY;  Service: Gastroenterology;  Laterality: N/A;   FINGER SURGERY     PORTA CATH INSERTION N/A 10/17/2022   Procedure: PORTA CATH INSERTION;  Surgeon: Annice Needy, MD;  Location: [facility] INVASIVE CV LAB;  Service: Cardiovascular;  Laterality: N/A;   TENDON REPAIR IN LEFT KNEE      No Known Allergies  Family History  Problem Relation Age of Onset   Heart attack Mother    Prostate cancer Father    Breast cancer Sister    Prostate cancer Paternal Uncle     Social History   Tobacco Use   Smoking status: Never   Smokeless tobacco: Never  Vaping Use   Vaping status: Never Used  Substance Use Topics   Alcohol use: Yes    Comment: OCCASIONALLY   Drug use: Never  Pertinent GI related history and allergies were reviewed with the patient  Review of Systems  Constitutional:  Positive for unexpected weight change. Negative for activity change, appetite change, chills, diaphoresis, fatigue and fever.  HENT:  Negative for trouble swallowing and voice change.   Respiratory:  Negative for shortness of breath and wheezing.   Cardiovascular:  Negative for chest pain,  palpitations and leg swelling.  Gastrointestinal:  Positive for vomiting. Negative for abdominal distention, abdominal pain, anal bleeding, blood in stool, constipation, diarrhea, nausea and rectal pain.  Musculoskeletal:  Negative for arthralgias and myalgias.  Skin:  Negative for color change and pallor.  Neurological:  Negative for dizziness, syncope and weakness.  Psychiatric/Behavioral:  Negative for confusion. The patient is not nervous/anxious.   All other systems reviewed and are negative.    Medications Home Medications No current facility-administered medications on file prior to encounter.   Current Outpatient Medications on File Prior to Encounter  Medication Sig Dispense Refill   acetaminophen (TYLENOL) 650 MG CR tablet Take 1,300 mg by mouth every 8 (eight) hours as needed for pain.     amLODipine (NORVASC) 10 MG tablet Take 10 mg by mouth daily.     apixaban (ELIQUIS) 2.5 MG TABS tablet Take 1 tablet (2.5 mg total) by mouth 2 (two) times daily. 60 tablet 2   atenolol (TENORMIN) 50 MG tablet Take 50 mg by mouth daily.     azelastine (ASTELIN) 0.1 % nasal spray Place 1 spray into both nostrils 2 (two) times daily. Use in each nostril as directed 30 mL 1   benzonatate (TESSALON) 200 MG capsule Take 1 capsule (200 mg total) by mouth 3 (three) times daily as needed for cough. 20 capsule 5   cetirizine (ZYRTEC ALLERGY) 10 MG tablet Take 1 tablet (10 mg total) by mouth at bedtime. (Patient not taking: Reported on 03/27/2023) 30 tablet 11   chlorhexidine (PERIDEX) 0.12 % solution USE AS DIRECTED TAKE  15  ML  IN  THE  MOUTH  OR  THROAT  TWICE  DAILY 473 mL 0   Cholecalciferol (VITAMIN D3) 125 MCG (5000 UT) CAPS Take 5,000 Units by mouth daily.     hydrochlorothiazide (HYDRODIURIL) 25 MG tablet Take 25 mg by mouth daily.     hydrocortisone-nystatin-lidocaine in diphenhydrAMINE liquid Swish and swallow 5 mLs by mouth 3 (three) times daily as needed for mouth pain. (Patient not taking:  Reported on 04/11/2023) 200 mL 2   Iron-Vitamin C 65-125 MG TABS Take 1 tablet by mouth daily. 30 tablet 0   levothyroxine (SYNTHROID) 125 MCG tablet Take 125 mcg by mouth every morning.     Multiple Vitamins-Minerals (MULTIVITAMIN WITH MINERALS) tablet Take 1 tablet by mouth daily.     omeprazole (PRILOSEC) 20 MG capsule Take 20 mg by mouth daily.     ondansetron (ZOFRAN) 8 MG tablet Take 1 tablet (8 mg total) by mouth every 8 (eight) hours as needed for nausea or vomiting. Start on the third day after chemotherapy.     potassium chloride SA (KLOR-CON M) 20 MEQ tablet Take 2 tablets (40 mEq total) by mouth 2 (two) times daily. 120 tablet 0   predniSONE (DELTASONE) 10 MG tablet Take 1 tablet (10 mg total) by mouth daily with breakfast. Take with your 20mg  tablets. Take 30mg  daily for 2 weeks. 14 tablet 0   predniSONE (DELTASONE) 20 MG tablet Take 2 tablets (40 mg total) by mouth daily with breakfast. 60 tablet 0   prochlorperazine (COMPAZINE) 10  MG tablet Take 1 tablet (10 mg total) by mouth every 6 (six) hours as needed for nausea or vomiting.     Pertinent GI related medications were reviewed with the patient  Inpatient Medications  Current Facility-Administered Medications:    acetaminophen (TYLENOL) CR tablet 1,300 mg, 1,300 mg, Oral, Q8H PRN, Marrion Coy, MD   atenolol (TENORMIN) tablet 50 mg, 50 mg, Oral, Daily, Marrion Coy, MD   Melene Muller ON 05/03/2023] levothyroxine (SYNTHROID) tablet 125 mcg, 125 mcg, Oral, q morning, Marrion Coy, MD   pantoprazole (PROTONIX) injection 40 mg, 40 mg, Intravenous, Q12H, Marrion Coy, MD   Vitamin D3 CAPS 5,000 Units, 5,000 Units, Oral, Daily, Marrion Coy, MD  Current Outpatient Medications:    acetaminophen (TYLENOL) 650 MG CR tablet, Take 1,300 mg by mouth every 8 (eight) hours as needed for pain., Disp: , Rfl:    amLODipine (NORVASC) 10 MG tablet, Take 10 mg by mouth daily., Disp: , Rfl:    apixaban (ELIQUIS) 2.5 MG TABS tablet, Take 1 tablet (2.5 mg  total) by mouth 2 (two) times daily., Disp: 60 tablet, Rfl: 2   atenolol (TENORMIN) 50 MG tablet, Take 50 mg by mouth daily., Disp: , Rfl:    azelastine (ASTELIN) 0.1 % nasal spray, Place 1 spray into both nostrils 2 (two) times daily. Use in each nostril as directed, Disp: 30 mL, Rfl: 1   benzonatate (TESSALON) 200 MG capsule, Take 1 capsule (200 mg total) by mouth 3 (three) times daily as needed for cough., Disp: 20 capsule, Rfl: 5   cetirizine (ZYRTEC ALLERGY) 10 MG tablet, Take 1 tablet (10 mg total) by mouth at bedtime. (Patient not taking: Reported on 03/27/2023), Disp: 30 tablet, Rfl: 11   chlorhexidine (PERIDEX) 0.12 % solution, USE AS DIRECTED TAKE  15  ML  IN  THE  MOUTH  OR  THROAT  TWICE  DAILY, Disp: 473 mL, Rfl: 0   Cholecalciferol (VITAMIN D3) 125 MCG (5000 UT) CAPS, Take 5,000 Units by mouth daily., Disp: , Rfl:    hydrochlorothiazide (HYDRODIURIL) 25 MG tablet, Take 25 mg by mouth daily., Disp: , Rfl:    hydrocortisone-nystatin-lidocaine in diphenhydrAMINE liquid, Swish and swallow 5 mLs by mouth 3 (three) times daily as needed for mouth pain. (Patient not taking: Reported on 04/11/2023), Disp: 200 mL, Rfl: 2   Iron-Vitamin C 65-125 MG TABS, Take 1 tablet by mouth daily., Disp: 30 tablet, Rfl: 0   levothyroxine (SYNTHROID) 125 MCG tablet, Take 125 mcg by mouth every morning., Disp: , Rfl:    Multiple Vitamins-Minerals (MULTIVITAMIN WITH MINERALS) tablet, Take 1 tablet by mouth daily., Disp: , Rfl:    omeprazole (PRILOSEC) 20 MG capsule, Take 20 mg by mouth daily., Disp: , Rfl:    ondansetron (ZOFRAN) 8 MG tablet, Take 1 tablet (8 mg total) by mouth every 8 (eight) hours as needed for nausea or vomiting. Start on the third day after chemotherapy., Disp: , Rfl:    potassium chloride SA (KLOR-CON M) 20 MEQ tablet, Take 2 tablets (40 mEq total) by mouth 2 (two) times daily., Disp: 120 tablet, Rfl: 0   predniSONE (DELTASONE) 10 MG tablet, Take 1 tablet (10 mg total) by mouth daily with  breakfast. Take with your 20mg  tablets. Take 30mg  daily for 2 weeks., Disp: 14 tablet, Rfl: 0   predniSONE (DELTASONE) 20 MG tablet, Take 2 tablets (40 mg total) by mouth daily with breakfast., Disp: 60 tablet, Rfl: 0   prochlorperazine (COMPAZINE) 10 MG tablet, Take 1 tablet (10  mg total) by mouth every 6 (six) hours as needed for nausea or vomiting., Disp: , Rfl:    acetaminophen   Objective   Vitals:   05/02/23 1133 05/02/23 1133  BP: 116/79   Pulse: 71   Resp: 18   Temp: 98 F (36.7 C)   SpO2: 99%   Weight:  97.1 kg  Height:  5' 8.5" (1.74 m)     Physical Exam Vitals and nursing note reviewed.  Constitutional:      General: He is not in acute distress.    Appearance: He is not ill-appearing, toxic-appearing or diaphoretic.  HENT:     Head: Normocephalic and atraumatic.     Nose: Nose normal.     Mouth/Throat:     Mouth: Mucous membranes are moist.     Pharynx: Oropharynx is clear.  Eyes:     General: No scleral icterus.    Extraocular Movements: Extraocular movements intact.  Cardiovascular:     Rate and Rhythm: Normal rate and regular rhythm.     Heart sounds: Normal heart sounds. No murmur heard.    No friction rub. No gallop.  Pulmonary:     Effort: Pulmonary effort is normal. No respiratory distress.     Breath sounds: Normal breath sounds. No wheezing, rhonchi or rales.  Abdominal:     General: Bowel sounds are normal. There is no distension.     Palpations: Abdomen is soft.     Tenderness: There is no abdominal tenderness. There is no guarding or rebound.  Musculoskeletal:     Cervical back: Neck supple.     Right lower leg: No edema.     Left lower leg: No edema.  Skin:    General: Skin is warm and dry.     Coloration: Skin is not jaundiced or pale.  Neurological:     General: No focal deficit present.     Mental Status: He is alert and oriented to person, place, and time. Mental status is at baseline.  Psychiatric:        Mood and Affect: Mood  normal.        Behavior: Behavior normal.        Thought Content: Thought content normal.        Judgment: Judgment normal.     Laboratory Data Recent Labs  Lab 05/02/23 1133  WBC 10.1  HGB 9.3*  HCT 29.3*  PLT 246   Recent Labs  Lab 05/02/23 1133 05/02/23 1141  NA 135  --   K 4.0  --   CL 101  --   CO2 27  --   BUN 22  --   CALCIUM 8.9  --   PROT  --  6.8  BILITOT  --  0.8  ALKPHOS  --  46  ALT  --  33  AST  --  22  GLUCOSE 107*  --    No results for input(s): "INR" in the last 168 hours.  No results for input(s): "LIPASE" in the last 72 hours.      Imaging Studies: CT ABDOMEN PELVIS W CONTRAST  Result Date: 05/02/2023 CLINICAL DATA:  Esophageal cancer, abdominal pain, vomiting, concern for esophageal erosion and GI bleeding EXAM: CT ABDOMEN AND PELVIS WITH CONTRAST TECHNIQUE: Multidetector CT imaging of the abdomen and pelvis was performed using the standard protocol following bolus administration of intravenous contrast. RADIATION DOSE REDUCTION: This exam was performed according to the departmental dose-optimization program which includes automated exposure control, adjustment of the  mA and/or kV according to patient size and/or use of iterative reconstruction technique. CONTRAST:  75mL OMNIPAQUE IOHEXOL 350 MG/ML SOLN COMPARISON:  CT chest abdomen pelvis, 03/19/2023 FINDINGS: Lower chest: Please see separately reported examination of the chest. Hepatobiliary: Slightly diminished size of multiple hypodense liver lesions, largest index lesion of the posterior liver dome measuring 8.2 x 5.1 cm, previously 9.3 x 6.2 cm (series 4, image 12). Small gallstones. No gallbladder wall thickening, or biliary dilatation. Pancreas: Unremarkable. No pancreatic ductal dilatation or surrounding inflammatory changes. Spleen: Normal in size without significant abnormality. Adrenals/Urinary Tract: Diminished size of bilateral adrenal masses, on the right measuring 4.3 x 3.4 cm, previously  5.9 x 4.4 cm (series 4, image 19). Left-sided nodule is almost completely resolved and difficult to discretely measure (series 4, image 17). Simple, benign left renal cortical cysts, for which no further follow-up or characterization is required. Small nonobstructive bilateral renal calculi. No ureteral calculi or hydronephrosis. Bladder is unremarkable. Stomach/Bowel: Stomach is within normal limits. Appendix appears normal. No evidence of bowel wall thickening, distention, or inflammatory changes. Descending and sigmoid diverticulosis. Vascular/Lymphatic: Aortic atherosclerosis. No enlarged abdominal or pelvic lymph nodes. Reproductive: No mass or other significant abnormality. Other: Small fat containing right inguinal hernia.  No ascites. Musculoskeletal: No acute or significant osseous findings. IMPRESSION: 1. No acute CT findings of the abdomen or pelvis to explain abdominal pain or vomiting. 2. Slightly diminished size of multiple hypodense liver lesions as well as bilateral adrenal masses, consistent with treatment response of abdominal metastatic disease. 3. Cholelithiasis. 4. Nonobstructive bilateral renal calculi. 5. Descending and sigmoid diverticulosis without evidence of acute diverticulitis. Aortic Atherosclerosis (ICD10-I70.0). Electronically Signed   By: Jearld Lesch M.D.   On: 05/02/2023 14:43   CT Angio Chest PE W and/or Wo Contrast  Result Date: 05/02/2023 CLINICAL DATA:  PE suspected, vomiting, esophageal cancer * Tracking Code: BO * EXAM: CT ANGIOGRAPHY CHEST WITH CONTRAST TECHNIQUE: Multidetector CT imaging of the chest was performed using the standard protocol during bolus administration of intravenous contrast. Multiplanar CT image reconstructions and MIPs were obtained to evaluate the vascular anatomy. RADIATION DOSE REDUCTION: This exam was performed according to the departmental dose-optimization program which includes automated exposure control, adjustment of the mA and/or kV  according to patient size and/or use of iterative reconstruction technique. CONTRAST:  75mL OMNIPAQUE IOHEXOL 350 MG/ML SOLN COMPARISON:  CT chest abdomen pelvis, 03/19/2023 FINDINGS: Cardiovascular: Satisfactory opacification of the pulmonary arteries to the segmental level. No evidence of pulmonary embolism. Normal heart size. Left coronary artery calcifications. No pericardial effusion. Right chest port catheter. Aortic atherosclerosis. Mediastinum/Nodes: No enlarged mediastinal, hilar, or axillary lymph nodes. Circumferential wall thickening of the lower third of the esophagus (series 2, image 97). Thyroid gland and trachea demonstrate no significant findings. Lungs/Pleura: Perihilar and infrahilar fibrosis of the right lower lobe, unchanged in appearance compared to prior examination (series 3, image 62). Bibasilar scarring or atelectasis. No pleural effusion or pneumothorax. Upper Abdomen: Please see separately reported examination of the abdomen and pelvis. Musculoskeletal: No chest wall abnormality. No acute osseous findings. Review of the MIP images confirms the above findings. IMPRESSION: 1. Negative examination for pulmonary embolism. 2. Circumferential wall thickening of the lower third of the esophagus, consistent with known esophageal malignancy. 3. Unchanged fibrotic scarring and volume loss of the perihilar right lung and right lower lobe. 4. Coronary artery disease. Aortic Atherosclerosis (ICD10-I70.0). Electronically Signed   By: Jearld Lesch M.D.   On: 05/02/2023 14:36   DG Chest 2  View  Result Date: 05/02/2023 CLINICAL DATA:  History of esophageal cancer with chest tightness and epigastric pain EXAM: CHEST - 2 VIEW COMPARISON:  Chest radiograph dated 02/19/2023 FINDINGS: Lines/tubes: Right chest wall port tip projects over the superior cavoatrial junction. Lungs: Mildly low lung volumes. Well inflated lungs. Bilateral perihilar linear scarring. No focal consolidations. Pleura: Unchanged  blunting of the right costophrenic angle. No pneumothorax. Heart/mediastinum: The heart size and mediastinal contours are within normal limits. Bones: No acute osseous abnormality. IMPRESSION: 1. No acute cardiopulmonary process. 2. Unchanged blunting of the right costophrenic angle, which may represent a small pleural effusion or pleural thickening. Electronically Signed   By: Agustin Cree M.D.   On: 05/02/2023 13:00    Assessment:   # Emesis- question of upper gi bleeding - dark emesis x2 today - he has been on his eliquis with last dose this morning - Hgb relatively stable aside from chemotherapy related decline - no melena/hematochezia/hematemsis - no dysphagia/odynophagia - suspect this is likely related to his esophageal malignancy or some other gastritis - denies nsaids or other risk factors - bun/cr ratio <30; VSS  # Esophageal Adenocarcinoma- Stage 4- with liver mets - actively undergoing palliative treatment (systemic) - s/p chemo  # dvt on eliquis  # immunosuppression with systemic chemotherapy   # abnormal weightloss  Plan:  Tentative plan for EGD on Monday with eliquis washout (last dose today) If mass is bleeding, only modality to temporize available here would be hemaspray. Holding eliquis at this time  Can trial viscous lidocaine for epigastric discomfort  Ok for soft diet Protonix 40 mg iv q12 h Hold dvt ppx Monitor H&H.  Transfusion and resuscitation as per primary team Avoid frequent lab draws to prevent lab induced anemia Supportive care and antiemetics as per primary team Avoid nsaids Monitor for GIB.  Reiewed imaging and labs I have discussed his case with his oncologist  Management of other medical comorbidities as per primary team  I personally performed the service.  Thank you for allowing [place] to participate in this patient's care. Please don't hesitate to call if any questions or concerns arise.   Jaynie Collins, DO [facility]  Gastroenterology  Portions of the record may have been created with voice recognition software. Occasional wrong-word or 'sound-a-like' substitutions may have occurred due to the inherent limitations of voice recognition software.  Read the chart carefully and recognize, using context, where substitutions may have occurred.

## 2023-05-02 NOTE — ED Provider Notes (Signed)
[facility] Provider Note    Event Date/Time   First MD Initiated Contact with Patient 05/02/23 1208     (approximate)   History   Emesis   HPI  Jonathan [name] is a 67 y.o. male with history of esophageal cancer, hypertension, hypothyroidism, currently undergoing chemotherapy and completed radiation for the esophageal cancer presents emergency department with epigastric pain, coffee-ground emesis, cough, possible dark stools.  Patient denies chest pain at this time.  States still has the cough and occasionally feel winded.  Symptoms started this morning.  Patient supposed to have chemo today I canceled it and told him come to the emergency department.      Physical Exam   Triage Vital Signs: ED Triage Vitals  Encounter Vitals Group     BP 05/02/23 1133 116/79     Systolic BP Percentile --      Diastolic BP Percentile --      Pulse Rate 05/02/23 1133 71     Resp 05/02/23 1133 18     Temp 05/02/23 1133 98 F (36.7 C)     Temp src --      SpO2 05/02/23 1133 99 %     Weight 05/02/23 1133 214 lb (97.1 kg)     Height 05/02/23 1133 5' 8.5" (1.74 m)     Head Circumference --      Peak Flow --      Pain Score 05/02/23 1131 5     Pain Loc --      Pain Education --      Exclude from Growth Chart --     Most recent vital signs: Vitals:   05/02/23 1133  BP: 116/79  Pulse: 71  Resp: 18  Temp: 98 F (36.7 C)  SpO2: 99%     General: Awake, no distress.   CV:  Good peripheral perfusion. regular rate and  rhythm Resp:  Normal effort. Lungs CTA Abd:  No distention.  Tender in epigastric Other:      ED Results / Procedures / Treatments   Labs (all labs ordered are listed, but only abnormal results are displayed) Labs Reviewed  BASIC METABOLIC PANEL - Abnormal; Notable for the following components:      Result Value   Glucose, Bld 107 (*)    All other components within normal limits  CBC - Abnormal; Notable for the following components:    RBC 3.39 (*)    Hemoglobin 9.3 (*)    HCT 29.3 (*)    RDW 20.0 (*)    nRBC 0.9 (*)    All other components within normal limits  HEPATIC FUNCTION PANEL - Abnormal; Notable for the following components:   Albumin 3.4 (*)    All other components within normal limits  HIV ANTIBODY (ROUTINE TESTING W REFLEX)  HEMOGLOBIN  TROPONIN I (HIGH SENSITIVITY)  TROPONIN I (HIGH SENSITIVITY)     EKG     RADIOLOGY CTA for PE, CT abdomen pelvis IV contrast    PROCEDURES:   Procedures   MEDICATIONS ORDERED IN ED: Medications  acetaminophen (TYLENOL) CR tablet 1,300 mg (has no administration in time range)  atenolol (TENORMIN) tablet 50 mg (has no administration in time range)  levothyroxine (SYNTHROID) tablet 125 mcg (has no administration in time range)  Vitamin D3 CAPS 5,000 Units (has no administration in time range)  pantoprazole (PROTONIX) injection 40 mg (has no administration in time range)  iohexol (OMNIPAQUE) 350 MG/ML injection 75 mL (75 mLs Intravenous Contrast  Given 05/02/23 1310)     IMPRESSION / MDM / ASSESSMENT AND PLAN / ED COURSE  I reviewed the triage vital signs and the nursing notes.                              Differential diagnosis includes, but is not limited to, esophageal erosion, upper GI bleed, PE, metastatic disease  Patient's presentation is most consistent with acute presentation with potential threat to life or bodily function.   Patient is currently taking Eliquis, concerns for GI bleed due to coffee-ground emesis this morning.  Basic labs are reassuring at this time as his hemoglobin appears to be stable.  CT of the chest for PE due to the cough and some shortness of breath, CT abdomen pelvis with IV contrast due to possible esophageal erosion, GI bleed, abdominal pain   CT of the chest for PE, CT abdomen pelvis IV contrast and feeling reviewed interpreted by me as being negative for any acute abnormality.  Due to the concerns of  coffee-ground emesis, Eliquis, and esophageal cancer patient will be admitted to the hospital  Consult hospitalist, Dr Chipper Herb is admitting.  I did advise him that Dr. Cathie Hoops is the patient's oncologist.  Patient is in agreement for admission.  He is stable at this time    FINAL CLINICAL IMPRESSION(S) / ED DIAGNOSES   Final diagnoses:  Upper GI bleed  History of esophageal cancer     Rx / DC Orders   ED Discharge Orders     None        Note:  This document was prepared using Dragon voice recognition software and may include unintentional dictation errors.    Faythe Ghee, PA-C 05/02/23 1520    Dionne Bucy, MD 05/02/23 2025    Dionne Bucy, MD 05/02/23 2026

## 2023-05-02 NOTE — Telephone Encounter (Signed)
Patient called stating that he has felt funny all week in his esophagus and he just vomited up blood (dark in color, thick consistency).Does report just a little pain in his esophagus He states that this is the first time he has done this. He has a 1:15 appointment with [place] today for lab and chemotherapy (Taxol) Please advise.

## 2023-05-02 NOTE — ED Notes (Signed)
Pt transported to inpatient unit at this time.

## 2023-05-02 NOTE — Consult Note (Signed)
Hematology/Oncology Consult note [phone] [phone]      Patient Care Team: [name], MD as PCP - General (Family Medicine) [name], RN as Oncology Nurse Navigator Rickard Patience, MD as Consulting Physician (Oncology)   Name of the patient: Jonathan [name]  [id number]  1955-12-04   REASON FOR COSULTATION:  Hematemesis, Stage IV Esophageal adenocarcinoma Consult was requested by Dr. Chipper Herb History of presenting illness-  67 y.o. male with PMH listed at below who presents to ER due to coffee-ground emesis. 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  Last treatment was 1 week ago. Patient reports that been feeling weak for about a week.  He felt nauseated and vomiting up coffee-ground emesis today.    Denies any chest pain, shortness of breath, cough, blood in the stool, fever or chills.  Patient is on tapering course of prednisone treatment for immunotherapy induced pneumonitis.  Patient is on Eliquis for treatment of DVT, recently decreased to 2.5 mg twice daily since 1 week ago.  Today hemoglobin is 9.3.  05/02/2023 CT chest angiogram showed negative for pulmonary embolism.  Circumferential wall thickening of lower third of esophagus.  Unchanged fibrotic scarring and volume loss of perihilar right lung and right lower lobe. CT chest abdomen pelvis with contrast showed no acute CT findings of abdomen/pelvis  Slightly diminished size of multiple hypodense liver lesions as well as bilateral adrenal masses, consistent with treatment response.  Cholelithiasis.  Nonobstructive bilateral renal calculi.  Descending and sigmoid diverticulosis without evidence of acute diverticulitis.     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    Priority: Medium    Deep venous thrombosis (HCC) 10/28/2022    Priority: Medium    Encounter for antineoplastic chemotherapy 10/21/2022    Priority: Medium    Hypokalemia 08/28/2022    Priority: Medium    Postnasal drip 02/26/2023    Priority: Low   Sinus congestion 02/12/2023    Priority: Low   Skin rash 01/13/2023    Priority: Low   Chemotherapy induced neutropenia (HCC) 11/25/2022    Priority: Low   Drug-induced neutropenia (HCC) 11/18/2022    Priority: Low   Weight loss 10/21/2022    Priority: Low   Dysphagia 10/21/2022    Priority: Low   Goals of care, counseling/discussion 08/28/2022    Priority: Low   Acute upper GI bleeding 05/02/2023   Prediabetes 03/12/2022   Elevated ALT measurement 08/08/2017   Fatty infiltration of liver 09/20/2014   Obesity (BMI 30-39.9) 09/20/2014     Past Medical History:  Diagnosis Date   Arthritis    Complication of anesthesia    OCCURRED ONCE YEARS AGO 1981   Esophageal mass    Hypertension    Hypothyroidism    PONV (postoperative nausea and vomiting)      Past Surgical History:  Procedure Laterality Date   COLONOSCOPY     ESOPHAGOGASTRODUODENOSCOPY N/A 08/23/2022   Procedure: ESOPHAGOGASTRODUODENOSCOPY (EGD);  Surgeon: Jaynie Collins, DO;  Location: [facility] ENDOSCOPY;  Service: Gastroenterology;  Laterality: N/A;   EUS N/A 09/12/2022   Procedure: FULL UPPER ENDOSCOPIC ULTRASOUND (EUS) RADIAL;  Surgeon: Burbridge, Prudencio Pair, MD;  Location: [facility] ENDOSCOPY;  Service: Gastroenterology;  Laterality: N/A;   FINGER SURGERY     PORTA CATH INSERTION N/A 10/17/2022   Procedure: PORTA CATH INSERTION;  Surgeon: Annice Needy, MD;  Location: [facility] INVASIVE CV LAB;  Service: Cardiovascular;  Laterality: N/A;   TENDON REPAIR IN LEFT KNEE      Social History   Socioeconomic History   Marital status: Single    Spouse name:  Not on file   Number of children: Not on file   Years of education: Not on file   Highest education level: Not on file  Occupational History   Not on file  Tobacco Use   Smoking status: Never   Smokeless tobacco: Never  Vaping Use   Vaping status: Never Used  Substance and Sexual Activity   Alcohol use: Yes    Comment: OCCASIONALLY   Drug use: Never   Sexual activity: Not on file  Other Topics Concern   Not on file  Social History Narrative   Not on file   Social Determinants of Health   Financial Resource Strain: Low Risk  (10/29/2022)   Received from [facility], [facility]   Overall Financial Resource Strain (CARDIA)    Difficulty of Paying Living Expenses: Not hard at all  Food Insecurity: No Food Insecurity (10/29/2022)   Received from [facility], [facility]   Hunger Vital Sign    Worried About Running Out of Food in the Last Year: Never true    Ran Out of Food in the Last Year: Never true  Transportation Needs: No Transportation Needs (10/29/2022)   Received from [facility], [facility]   [facility] - Transportation    In the past 12 months, has lack of transportation kept you from medical appointments or from getting medications?: No    Lack of Transportation (Non-Medical): No  Physical Activity: Not on file  Stress: Not on file  Social Connections: Not on file  Intimate Partner Violence: Not At Risk (08/28/2022)   Humiliation, Afraid, Rape, and Kick questionnaire    Fear of Current or Ex-Partner: No    Emotionally Abused: No    Physically Abused: No    Sexually Abused: No     Family History  Problem Relation Age of Onset   Heart attack Mother    Prostate cancer Father    Breast cancer Sister    Prostate cancer Paternal Uncle      Current Facility-Administered Medications:    acetaminophen (TYLENOL) tablet 975 mg, 975 mg, Oral, Q8H PRN, Marrion Coy,  MD   atenolol (TENORMIN) tablet 50 mg, 50 mg, Oral, Daily, Marrion Coy, MD   cholecalciferol (VITAMIN D3) 25 MCG (1000 UNIT) tablet 5,000 Units, 5,000 Units, Oral, Daily, Marrion Coy, MD   Melene Muller ON 05/03/2023] levothyroxine (SYNTHROID) tablet 125 mcg, 125 mcg, Oral, q morning, Zhang, Dekui, MD   lidocaine (XYLOCAINE) 2 % viscous mouth solution 15 mL, 15 mL, Mouth/Throat, Q4H PRN, Jaynie Collins, DO   pantoprazole (PROTONIX) injection 40 mg, 40 mg, Intravenous, Q12H, Marrion Coy, MD, 40 mg at 05/02/23 0981  Review of Systems  Constitutional:  Positive for fatigue. Negative for appetite change, chills, fever and unexpected weight change.  HENT:   Negative for hearing loss and voice change.   Eyes:  Negative for eye problems and icterus.  Respiratory:  Negative for chest tightness, cough and shortness of breath.   Cardiovascular:  Negative for chest  pain and leg swelling.  Gastrointestinal:  Negative for abdominal distention and abdominal pain.       Epigastric discomfort, coffee-ground emesis  Endocrine: Negative for hot flashes.  Genitourinary:  Negative for difficulty urinating, dysuria and frequency.   Musculoskeletal:  Negative for arthralgias.  Skin:  Negative for itching and rash.  Neurological:  Negative for light-headedness and numbness.  Hematological:  Negative for adenopathy. Does not bruise/bleed easily.  Psychiatric/Behavioral:  Negative for confusion.     PHYSICAL EXAM Vitals:   05/02/23 1621 05/02/23 1800 05/02/23 1832 05/02/23 2126  BP: 110/69 122/78 125/84 109/74  Pulse: 83 88 72 69  Resp: 17 17 18 18   Temp:   98.6 F (37 C) 98.4 F (36.9 C)  TempSrc:   Oral Oral  SpO2: 100% 100% 100% 99%  Weight:      Height:       Physical Exam Constitutional:      General: He is not in acute distress.    Appearance: He is not diaphoretic.  HENT:     Head: Normocephalic and atraumatic.     Nose: Nose normal.  Eyes:     General: No scleral icterus.    Pupils:  Pupils are equal, round, and reactive to light.  Cardiovascular:     Rate and Rhythm: Normal rate and regular rhythm.     Heart sounds: No murmur heard. Pulmonary:     Effort: Pulmonary effort is normal. No respiratory distress.  Abdominal:     General: Bowel sounds are normal. There is no distension.     Palpations: Abdomen is soft.  Musculoskeletal:        General: Normal range of motion.     Cervical back: Normal range of motion and neck supple.  Skin:    General: Skin is warm and dry.     Findings: No erythema.  Neurological:     Mental Status: He is alert and oriented to person, place, and time. Mental status is at baseline.     Motor: No abnormal muscle tone.  Psychiatric:        Mood and Affect: Mood and affect normal.       LABORATORY STUDIES    Latest Ref Rng & Units 05/02/2023    7:36 PM 05/02/2023   11:33 AM 04/25/2023    8:25 AM  CBC  WBC 4.0 - 10.5 K/uL  10.1  7.4   Hemoglobin 13.0 - 17.0 g/dL 9.2  9.3  57.3   Hematocrit 39.0 - 52.0 %  29.3  33.2   Platelets 150 - 400 K/uL  246  206       Latest Ref Rng & Units 05/02/2023   11:41 AM 05/02/2023   11:33 AM 04/25/2023    8:25 AM  CMP  Glucose 70 - 99 mg/dL  220  96   BUN 8 - 23 mg/dL  22  18   Creatinine 2.54 - 1.24 mg/dL  2.70  6.23   Sodium 762 - 145 mmol/L  135  138   Potassium 3.5 - 5.1 mmol/L  4.0  3.9   Chloride 98 - 111 mmol/L  101  104   CO2 22 - 32 mmol/L  27  25   Calcium 8.9 - 10.3 mg/dL  8.9  9.5   Total Protein 6.5 - 8.1 g/dL 6.8   6.9   Total Bilirubin 0.3 - 1.2 mg/dL 0.8   0.5   Alkaline Phos 38 - 126 U/L 46   57  AST 15 - 41 U/L 22   20   ALT 0 - 44 U/L 33   32      RADIOGRAPHIC STUDIES: I have personally reviewed the radiological images as listed and agreed with the findings in the report. CT ABDOMEN PELVIS W CONTRAST  Result Date: 05/02/2023 CLINICAL DATA:  Esophageal cancer, abdominal pain, vomiting, concern for esophageal erosion and GI bleeding EXAM: CT ABDOMEN AND PELVIS WITH  CONTRAST TECHNIQUE: Multidetector CT imaging of the abdomen and pelvis was performed using the standard protocol following bolus administration of intravenous contrast. RADIATION DOSE REDUCTION: This exam was performed according to the departmental dose-optimization program which includes automated exposure control, adjustment of the mA and/or kV according to patient size and/or use of iterative reconstruction technique. CONTRAST:  75mL OMNIPAQUE IOHEXOL 350 MG/ML SOLN COMPARISON:  CT chest abdomen pelvis, 03/19/2023 FINDINGS: Lower chest: Please see separately reported examination of the chest. Hepatobiliary: Slightly diminished size of multiple hypodense liver lesions, largest index lesion of the posterior liver dome measuring 8.2 x 5.1 cm, previously 9.3 x 6.2 cm (series 4, image 12). Small gallstones. No gallbladder wall thickening, or biliary dilatation. Pancreas: Unremarkable. No pancreatic ductal dilatation or surrounding inflammatory changes. Spleen: Normal in size without significant abnormality. Adrenals/Urinary Tract: Diminished size of bilateral adrenal masses, on the right measuring 4.3 x 3.4 cm, previously 5.9 x 4.4 cm (series 4, image 19). Left-sided nodule is almost completely resolved and difficult to discretely measure (series 4, image 17). Simple, benign left renal cortical cysts, for which no further follow-up or characterization is required. Small nonobstructive bilateral renal calculi. No ureteral calculi or hydronephrosis. Bladder is unremarkable. Stomach/Bowel: Stomach is within normal limits. Appendix appears normal. No evidence of bowel wall thickening, distention, or inflammatory changes. Descending and sigmoid diverticulosis. Vascular/Lymphatic: Aortic atherosclerosis. No enlarged abdominal or pelvic lymph nodes. Reproductive: No mass or other significant abnormality. Other: Small fat containing right inguinal hernia.  No ascites. Musculoskeletal: No acute or significant osseous findings.  IMPRESSION: 1. No acute CT findings of the abdomen or pelvis to explain abdominal pain or vomiting. 2. Slightly diminished size of multiple hypodense liver lesions as well as bilateral adrenal masses, consistent with treatment response of abdominal metastatic disease. 3. Cholelithiasis. 4. Nonobstructive bilateral renal calculi. 5. Descending and sigmoid diverticulosis without evidence of acute diverticulitis. Aortic Atherosclerosis (ICD10-I70.0). Electronically Signed   By: Jearld Lesch M.D.   On: 05/02/2023 14:43   CT Angio Chest PE W and/or Wo Contrast  Result Date: 05/02/2023 CLINICAL DATA:  PE suspected, vomiting, esophageal cancer * Tracking Code: BO * EXAM: CT ANGIOGRAPHY CHEST WITH CONTRAST TECHNIQUE: Multidetector CT imaging of the chest was performed using the standard protocol during bolus administration of intravenous contrast. Multiplanar CT image reconstructions and MIPs were obtained to evaluate the vascular anatomy. RADIATION DOSE REDUCTION: This exam was performed according to the departmental dose-optimization program which includes automated exposure control, adjustment of the mA and/or kV according to patient size and/or use of iterative reconstruction technique. CONTRAST:  75mL OMNIPAQUE IOHEXOL 350 MG/ML SOLN COMPARISON:  CT chest abdomen pelvis, 03/19/2023 FINDINGS: Cardiovascular: Satisfactory opacification of the pulmonary arteries to the segmental level. No evidence of pulmonary embolism. Normal heart size. Left coronary artery calcifications. No pericardial effusion. Right chest port catheter. Aortic atherosclerosis. Mediastinum/Nodes: No enlarged mediastinal, hilar, or axillary lymph nodes. Circumferential wall thickening of the lower third of the esophagus (series 2, image 97). Thyroid gland and trachea demonstrate no significant findings. Lungs/Pleura: Perihilar and infrahilar fibrosis of the right  lower lobe, unchanged in appearance compared to prior examination (series 3, image  62). Bibasilar scarring or atelectasis. No pleural effusion or pneumothorax. Upper Abdomen: Please see separately reported examination of the abdomen and pelvis. Musculoskeletal: No chest wall abnormality. No acute osseous findings. Review of the MIP images confirms the above findings. IMPRESSION: 1. Negative examination for pulmonary embolism. 2. Circumferential wall thickening of the lower third of the esophagus, consistent with known esophageal malignancy. 3. Unchanged fibrotic scarring and volume loss of the perihilar right lung and right lower lobe. 4. Coronary artery disease. Aortic Atherosclerosis (ICD10-I70.0). Electronically Signed   By: Jearld Lesch M.D.   On: 05/02/2023 14:36   DG Chest 2 View  Result Date: 05/02/2023 CLINICAL DATA:  History of esophageal cancer with chest tightness and epigastric pain EXAM: CHEST - 2 VIEW COMPARISON:  Chest radiograph dated 02/19/2023 FINDINGS: Lines/tubes: Right chest wall port tip projects over the superior cavoatrial junction. Lungs: Mildly low lung volumes. Well inflated lungs. Bilateral perihilar linear scarring. No focal consolidations. Pleura: Unchanged blunting of the right costophrenic angle. No pneumothorax. Heart/mediastinum: The heart size and mediastinal contours are within normal limits. Bones: No acute osseous abnormality. IMPRESSION: 1. No acute cardiopulmonary process. 2. Unchanged blunting of the right costophrenic angle, which may represent a small pleural effusion or pleural thickening. Electronically Signed   By: Agustin Cree M.D.   On: 05/02/2023 13:00   CT CHEST ABDOMEN PELVIS W CONTRAST  Result Date: 03/20/2023 CLINICAL DATA:  Esophageal adenocarcinoma restaging * Tracking Code: BO * EXAM: CT CHEST, ABDOMEN, AND PELVIS WITH CONTRAST TECHNIQUE: Multidetector CT imaging of the chest, abdomen and pelvis was performed following the standard protocol during bolus administration of intravenous contrast. RADIATION DOSE REDUCTION: This exam was  performed according to the departmental dose-optimization program which includes automated exposure control, adjustment of the mA and/or kV according to patient size and/or use of iterative reconstruction technique. CONTRAST:  OMNIPAQUE IOHEXOL 300 MG/ML  SOLN COMPARISON:  PET-CT, 01/08/2023 FINDINGS: CT CHEST FINDINGS Cardiovascular: Right chest port catheter. Scattered aortic atherosclerosis. Normal heart size. No pericardial effusion. Mediastinum/Nodes: No enlarged mediastinal, hilar, or axillary lymph nodes. Similar circumferential wall thickening throughout the mid to lower esophagus (series 2, image 78). Thyroid gland and trachea demonstrate no significant findings. Lungs/Pleura: Interval development of bandlike consolidation and fibrosis of the paramedian right lung, particularly of the right lower lobe (series 4, image 89), as well as additional scattered irregular opacities throughout the right lung. No pleural effusion or pneumothorax. Musculoskeletal: No chest wall abnormality. No acute osseous findings. CT ABDOMEN PELVIS FINDINGS Hepatobiliary: Multiple new and enlarged hypodense lesions throughout the liver, largest again in the posterior liver dome, centered in hepatic segment VII, measuring 9.3 x 6.2 cm, previously 4.8 x 4.7 cm (series 2, image 47). Example new lesion anteriorly in hepatic segment VIII measures 4.4 x 3.8 cm (series 2, image 44). No gallstones, gallbladder wall thickening, or biliary dilatation. Pancreas: Unremarkable. No pancreatic ductal dilatation or surrounding inflammatory changes. Spleen: Normal in size without significant abnormality. Adrenals/Urinary Tract: Significant enlargement of a right adrenal metastasis, measuring 5.9 x 4.4 cm, previously 2.5 x 2.3 cm (series 2, image 57). Interval enlargement of a left adrenal metastasis, measuring 2.2 x 1.5 cm (series 2, image 65). Small nonobstructive bilateral renal calculi. No hydronephrosis. Bladder is unremarkable.  Stomach/Bowel: Stomach is within normal limits. Appendix appears normal. No evidence of bowel wall thickening, distention, or inflammatory changes. Sigmoid diverticulosis. Vascular/Lymphatic: Scattered aortic atherosclerosis. No persistently enlarged abdominal or pelvic  lymph nodes. Reproductive: Prostatomegaly. Other: No abdominal wall hernia or abnormality. No ascites. Musculoskeletal: No acute osseous findings. Subtle sclerosis of an osseous metastasis of the right femoral neck (series 2, image 116). IMPRESSION: 1. Interval progression of metastatic disease with multiple new and enlarged hypodense lesions throughout the liver. 2. Significant enlargement of bilateral adrenal metastases. 3. Subtle sclerosis of an osseous metastasis of the right femoral neck. Other previously FDG avid osseous metastases of the left femoral neck and right sixth rib not appreciated by CT. 4. No persistently enlarged lymph nodes. 5. Interval development of bandlike consolidation and fibrosis of the paramedian right lung, particularly of the right lower lobe, as well as additional scattered irregular opacities throughout the right lung. 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 6. Similar circumferential wall thickening throughout the mid to lower esophagus, consistent with known primary esophageal adenocarcinoma. 7. Nonobstructive bilateral nephrolithiasis. Aortic Atherosclerosis (ICD10-I70.0). Electronically Signed   By: Jearld Lesch M.D.   On: 03/20/2023 19:31   DG Chest 2 View  Result Date: 02/19/2023 CLINICAL DATA:  Cough.  Esophageal cancer EXAM: CHEST - 2 VIEW COMPARISON:  01/08/2023 FINDINGS: Right-sided chest port terminates within the distal SVC. Normal heart size. New bilateral perihilar interstitial opacities, right worse than left. No pleural effusion or pneumothorax. IMPRESSION: Interval development of bilateral perihilar interstitial opacities, which may reflect edema or  atypical infection. Electronically Signed   By: Duanne Guess D.O.   On: 02/19/2023 15:01     Assessment and plan-   # Coffee-ground emesis, possible upper GI bleeding. Possible stomach ulcer secondary to recent steroid use versus bleeding from esophageal cancer. Hold Eliquis PPI Protonix 40 mg IV every 12 hours. GI has been consulted.  Discussed with Dr. Timothy Lasso Possible EGD on Monday Monitor blood count.  # Esophageal adenocarcinoma with liver metastasis Currently on second line chemotherapy. CT scan shows mild treatment response. Follow-up outpatient.  # Normocytic anemia, hemoglobin 9.3, slightly decreased compared to 1 week ago. This could be secondary to chemotherapy induced marrow suppression, or blood loss Monitor CBC daily.  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 Plan to resume steroid when acute issue resolves.   Thank you for allowing me to participate in the care of this patient.   Rickard Patience, MD, PhD Hematology Oncology 05/02/2023

## 2023-05-03 ENCOUNTER — Other Ambulatory Visit: Payer: Self-pay

## 2023-05-03 DIAGNOSIS — R1110 Vomiting, unspecified: Secondary | ICD-10-CM | POA: Diagnosis not present

## 2023-05-03 DIAGNOSIS — Z7901 Long term (current) use of anticoagulants: Secondary | ICD-10-CM | POA: Diagnosis not present

## 2023-05-03 DIAGNOSIS — R1013 Epigastric pain: Secondary | ICD-10-CM | POA: Diagnosis not present

## 2023-05-03 DIAGNOSIS — C159 Malignant neoplasm of esophagus, unspecified: Secondary | ICD-10-CM | POA: Diagnosis not present

## 2023-05-03 DIAGNOSIS — Z8501 Personal history of malignant neoplasm of esophagus: Secondary | ICD-10-CM | POA: Diagnosis not present

## 2023-05-03 DIAGNOSIS — I82621 Acute embolism and thrombosis of deep veins of right upper extremity: Secondary | ICD-10-CM | POA: Diagnosis not present

## 2023-05-03 DIAGNOSIS — K922 Gastrointestinal hemorrhage, unspecified: Secondary | ICD-10-CM | POA: Diagnosis not present

## 2023-05-03 LAB — COMPREHENSIVE METABOLIC PANEL
ALT: 32 U/L (ref 0–44)
AST: 22 U/L (ref 15–41)
Albumin: 3.3 g/dL — ABNORMAL LOW (ref 3.5–5.0)
Alkaline Phosphatase: 41 U/L (ref 38–126)
Anion gap: 10 (ref 5–15)
BUN: 19 mg/dL (ref 8–23)
CO2: 26 mmol/L (ref 22–32)
Calcium: 8.8 mg/dL — ABNORMAL LOW (ref 8.9–10.3)
Chloride: 101 mmol/L (ref 98–111)
Creatinine, Ser: 0.79 mg/dL (ref 0.61–1.24)
GFR, Estimated: >60 mL/min (ref >60)
Glucose, Bld: 85 mg/dL (ref 70–99)
Potassium: 3.7 mmol/L (ref 3.5–5.1)
Sodium: 137 mmol/L (ref 135–145)
Total Bilirubin: 1 mg/dL (ref 0.3–1.2)
Total Protein: 6.8 g/dL (ref 6.5–8.1)

## 2023-05-03 LAB — CBC
HCT: 27.7 % — ABNORMAL LOW (ref 39.0–52.0)
Hemoglobin: 8.7 g/dL — ABNORMAL LOW (ref 13.0–17.0)
MCH: 27.1 pg (ref 26.0–34.0)
MCHC: 31.4 g/dL (ref 30.0–36.0)
MCV: 86.3 fL (ref 80.0–100.0)
Platelets: 209 10*3/uL (ref 150–400)
RBC: 3.21 MIL/uL — ABNORMAL LOW (ref 4.22–5.81)
RDW: 20.2 % — ABNORMAL HIGH (ref 11.5–15.5)
WBC: 7.1 10*3/uL (ref 4.0–10.5)
nRBC: 1.1 % — ABNORMAL HIGH (ref 0.0–0.2)

## 2023-05-03 LAB — HEMOGLOBIN: Hemoglobin: 8.5 g/dL — ABNORMAL LOW (ref 13.0–17.0)

## 2023-05-03 LAB — HIV ANTIBODY (ROUTINE TESTING W REFLEX): HIV Screen 4th Generation wRfx: NONREACTIVE

## 2023-05-03 MED ORDER — MELATONIN 5 MG PO TABS
5.0000 mg | ORAL_TABLET | Freq: Every day | ORAL | Status: DC
  Administered 2023-05-03 – 2023-05-07 (×5): 5 mg via ORAL
  Filled 2023-05-03 (×5): qty 1

## 2023-05-03 NOTE — Plan of Care (Signed)
  Problem: Education: Goal: Knowledge of General Education information will improve Description: Including pain rating scale, medication(s)/side effects and non-pharmacologic comfort measures 05/03/2023 1317 by Murriel Hopper, Donald Pore, RN Outcome: Progressing 05/03/2023 1308 by Murriel Hopper, Donald Pore, RN Outcome: Progressing   Problem: Health Behavior/Discharge Planning: Goal: Ability to manage health-related needs will improve 05/03/2023 1317 by Murriel Hopper, Donald Pore, RN Outcome: Progressing 05/03/2023 1308 by Murriel Hopper, Donald Pore, RN Outcome: Progressing   Problem: Clinical Measurements: Goal: Ability to maintain clinical measurements within normal limits will improve 05/03/2023 1317 by Murriel Hopper, Donald Pore, RN Outcome: Progressing 05/03/2023 1308 by Murriel Hopper, Donald Pore, RN Outcome: Progressing Goal: Will remain free from infection 05/03/2023 1317 by Murriel Hopper, Donald Pore, RN Outcome: Progressing 05/03/2023 1308 by Murriel Hopper, Donald Pore, RN Outcome: Progressing Goal: Diagnostic test results will improve 05/03/2023 1317 by Monica Becton, RN Outcome: Progressing 05/03/2023 1308 by Murriel Hopper, Donald Pore, RN Outcome: Progressing Goal: Respiratory complications will improve 05/03/2023 1317 by Monica Becton, RN Outcome: Progressing 05/03/2023 1308 by Murriel Hopper, Donald Pore, RN Outcome: Progressing Goal: Cardiovascular complication will be avoided 05/03/2023 1317 by Monica Becton, RN Outcome: Progressing 05/03/2023 1308 by Monica Becton, RN Outcome: Progressing   Problem: Activity: Goal: Risk for activity intolerance will decrease 05/03/2023 1317 by Murriel Hopper, Donald Pore, RN Outcome: Progressing 05/03/2023 1308 by Murriel Hopper, Donald Pore, RN Outcome: Progressing   Problem: Nutrition: Goal: Adequate nutrition will be maintained 05/03/2023 1317 by Murriel Hopper, Donald Pore, RN Outcome: Progressing 05/03/2023 1308 by Murriel Hopper, Donald Pore, RN Outcome: Progressing   Problem: Coping: Goal: Level of anxiety will decrease 05/03/2023 1317 by Murriel Hopper, Donald Pore, RN Outcome: Progressing 05/03/2023 1308 by Murriel Hopper, Donald Pore, RN Outcome: Progressing   Problem: Elimination: Goal: Will not experience complications related to bowel motility 05/03/2023 1317 by Monica Becton, RN Outcome: Progressing 05/03/2023 1308 by Murriel Hopper, Donald Pore, RN Outcome: Progressing Goal: Will not experience complications related to urinary retention 05/03/2023 1317 by Monica Becton, RN Outcome: Progressing 05/03/2023 1308 by Murriel Hopper, Donald Pore, RN Outcome: Progressing   Problem: Pain Managment: Goal: General experience of comfort will improve 05/03/2023 1317 by Monica Becton, RN Outcome: Progressing 05/03/2023 1308 by Murriel Hopper, Donald Pore, RN Outcome: Progressing   Problem: Pain Managment: Goal: General experience of comfort will improve 05/03/2023 1317 by Monica Becton, RN Outcome: Progressing 05/03/2023 1308 by Murriel Hopper, Donald Pore, RN Outcome: Progressing   Problem: Safety: Goal: Ability to remain free from injury will improve 05/03/2023 1317 by Monica Becton, RN Outcome: Progressing 05/03/2023 1308 by Murriel Hopper, Donald Pore, RN Outcome: Progressing   Problem: Skin Integrity: Goal: Risk for impaired skin integrity will decrease 05/03/2023 1317 by Monica Becton, RN Outcome: Progressing 05/03/2023 1308 by Monica Becton, RN Outcome: Progressing

## 2023-05-03 NOTE — Plan of Care (Signed)

## 2023-05-03 NOTE — Progress Notes (Signed)
MD notified about the patient having two black tarry bowel movements.

## 2023-05-03 NOTE — Progress Notes (Signed)
Progress Note   Patient: Jonathan [name] [id number]:[id number] DOB: 07/21/1956 DOA: 05/02/2023     1 DOS: the patient was seen and examined on 05/03/2023   Brief hospital course: CHESS TREVETT is a 67 y.o. male with medical history significant of esophageal cancer, hypothyroidism, essential hypertension, history of DVT on Eliquis, who present to the hospital with coffee-ground emesis.  Patient is seen by GI, scheduled for EGD on Monday.   Principal Problem:   Acute upper GI bleeding Active Problems:   Adenocarcinoma of esophagus (HCC)   Obesity (BMI 30-39.9)   Deep venous thrombosis (HCC)   Normocytic anemia   Assessment and Plan:  Upper GI bleed with coffee-ground emesis. Esophageal cancer. Patient had some stomach discomfort for the last week, started to have nausea and coffee-ground emesis.  Possibility of gastritis versus peptic ulcer disease.  Will start Protonix 40 mg IV twice a day.   Another consideration is esophageal cancer bleeding. Hold off anticoagulation. Seen by GI, scheduled on colonoscopy Monday. Continue PPI. Continue to follow hemoglobin every 12 hours, so far hemoglobin stable.   DVT with IV port thrombosis. Patient had an episode of IV port thrombosis in May 2024, will hold off anticoagulation due to active bleeding.   Chronic anemia. Likely due to chemotherapy, hemoglobin stable, iron level was normal.  B12 level pending.   Essential hypertension Continue some home medicines   Hypothyroidism Continue Synthroid.      Subjective:  Patient is doing well today, no additional nausea vomiting.  Physical Exam: Vitals:   05/02/23 1832 05/02/23 2126 05/03/23 0243 05/03/23 0745  BP: 125/84 109/74 125/82 106/74  Pulse: 72 69 77 81  Resp: 18 18 20 18   Temp: 98.6 F (37 C) 98.4 F (36.9 C) 98.2 F (36.8 C) 98.3 F (36.8 C)  TempSrc: Oral Oral Oral Oral  SpO2: 100% 99% 99% 97%  Weight:      Height:       General exam: Appears calm and comfortable   Respiratory system: Clear to auscultation. Respiratory effort normal. Cardiovascular system: S1 & S2 heard, RRR. No JVD, murmurs, rubs, gallops or clicks. No pedal edema. Gastrointestinal system: Abdomen is nondistended, soft and nontender. No organomegaly or masses felt. Normal bowel sounds heard. Central nervous system: Alert and oriented. No focal neurological deficits. Extremities: Symmetric 5 x 5 power. Skin: No rashes, lesions or ulcers Psychiatry: Judgement and insight appear normal. Mood & affect appropriate.    Data Reviewed:  Lab results reviewed.  Family Communication: None  Disposition: Status is: Inpatient Remains inpatient appropriate because: Severity of disease, IV treatment.     Time spent: 35 minutes  Author: Marrion Coy, MD 05/03/2023 1:31 PM  For on call review [email or web address].

## 2023-05-03 NOTE — Plan of Care (Signed)
  Problem: Education: Goal: Knowledge of General Education information will improve Description: Including pain rating scale, medication(s)/side effects and non-pharmacologic comfort measures 05/03/2023 1320 by Murriel Hopper, Donald Pore, RN Outcome: Progressing 05/03/2023 1317 by Murriel Hopper, Donald Pore, RN Outcome: Progressing 05/03/2023 1308 by Murriel Hopper, Donald Pore, RN Outcome: Progressing   Problem: Health Behavior/Discharge Planning: Goal: Ability to manage health-related needs will improve 05/03/2023 1320 by Murriel Hopper, Donald Pore, RN Outcome: Progressing 05/03/2023 1317 by Murriel Hopper, Donald Pore, RN Outcome: Progressing 05/03/2023 1308 by Murriel Hopper, Donald Pore, RN Outcome: Progressing   Problem: Clinical Measurements: Goal: Ability to maintain clinical measurements within normal limits will improve 05/03/2023 1320 by Murriel Hopper, Donald Pore, RN Outcome: Progressing 05/03/2023 1317 by Monica Becton, RN Outcome: Progressing 05/03/2023 1308 by Murriel Hopper, Donald Pore, RN Outcome: Progressing Goal: Will remain free from infection 05/03/2023 1320 by Murriel Hopper, Donald Pore, RN Outcome: Progressing 05/03/2023 1317 by Monica Becton, RN Outcome: Progressing 05/03/2023 1308 by Murriel Hopper, Donald Pore, RN Outcome: Progressing Goal: Diagnostic test results will improve 05/03/2023 1320 by Murriel Hopper, Donald Pore, RN Outcome: Progressing 05/03/2023 1317 by Monica Becton, RN Outcome: Progressing 05/03/2023 1308 by Murriel Hopper, Donald Pore, RN Outcome: Progressing Goal: Respiratory complications will improve 05/03/2023 1320 by Monica Becton, RN Outcome: Progressing 05/03/2023 1317 by Monica Becton, RN Outcome: Progressing 05/03/2023 1308 by Murriel Hopper, Donald Pore, RN Outcome: Progressing Goal: Cardiovascular complication will be avoided 05/03/2023 1320 by Monica Becton, RN Outcome: Progressing 05/03/2023 1317 by Monica Becton, RN Outcome: Progressing 05/03/2023 1308 by Monica Becton, RN Outcome: Progressing   Problem: Activity: Goal: Risk for activity intolerance will decrease 05/03/2023 1320 by Murriel Hopper, Donald Pore, RN Outcome: Progressing 05/03/2023 1317 by Murriel Hopper, Donald Pore, RN Outcome: Progressing 05/03/2023 1308 by Murriel Hopper, Donald Pore, RN Outcome: Progressing   Problem: Nutrition: Goal: Adequate nutrition will be maintained 05/03/2023 1320 by Murriel Hopper, Donald Pore, RN Outcome: Progressing 05/03/2023 1317 by Murriel Hopper, Donald Pore, RN Outcome: Progressing 05/03/2023 1308 by Murriel Hopper, Donald Pore, RN Outcome: Progressing   Problem: Coping: Goal: Level of anxiety will decrease 05/03/2023 1320 by Murriel Hopper, Donald Pore, RN Outcome: Progressing 05/03/2023 1317 by Murriel Hopper, Donald Pore, RN Outcome: Progressing 05/03/2023 1308 by Murriel Hopper, Donald Pore, RN Outcome: Progressing   Problem: Elimination: Goal: Will not experience complications related to bowel motility 05/03/2023 1320 by Murriel Hopper, Donald Pore, RN Outcome: Progressing 05/03/2023 1317 by Murriel Hopper, Donald Pore, RN Outcome: Progressing 05/03/2023 1308 by Murriel Hopper, Donald Pore, RN Outcome: Progressing Goal: Will not experience complications related to urinary retention 05/03/2023 1320 by Murriel Hopper, Donald Pore, RN Outcome: Progressing 05/03/2023 1317 by Monica Becton, RN Outcome: Progressing 05/03/2023 1308 by Murriel Hopper, Donald Pore, RN Outcome: Progressing   Problem: Pain Managment: Goal: General experience of comfort will improve 05/03/2023 1320 by Murriel Hopper, Donald Pore, RN Outcome: Progressing 05/03/2023 1317 by Monica Becton, RN Outcome: Progressing 05/03/2023 1308 by Murriel Hopper, Donald Pore, RN Outcome: Progressing   Problem: Safety: Goal: Ability to remain free from injury will improve 05/03/2023 1320 by Murriel Hopper, Donald Pore, RN Outcome: Progressing 05/03/2023 1317  by Monica Becton, RN Outcome: Progressing 05/03/2023 1308 by Murriel Hopper, Donald Pore, RN Outcome: Progressing   Problem: Skin Integrity: Goal: Risk for impaired skin integrity will decrease 05/03/2023 1320 by Monica Becton, RN Outcome: Progressing 05/03/2023 1317 by Monica Becton, RN Outcome: Progressing 05/03/2023 1308 by Monica Becton, RN Outcome: Progressing

## 2023-05-03 NOTE — Hospital Course (Signed)
Jonathan [name] is a 67 y.o. male with medical history significant of esophageal cancer, hypothyroidism, essential hypertension, history of DVT on Eliquis, who present to the hospital with coffee-ground emesis.  Patient is seen by GI, scheduled for EGD on Monday.

## 2023-05-03 NOTE — Plan of Care (Signed)
  Problem: Health Behavior/Discharge Planning: Goal: Ability to manage health-related needs will improve Outcome: Progressing   Problem: Safety: Goal: Ability to remain free from injury will improve Outcome: Progressing   Problem: Skin Integrity: Goal: Risk for impaired skin integrity will decrease Outcome: Progressing   Problem: Elimination: Goal: Will not experience complications related to bowel motility Outcome: Progressing

## 2023-05-03 NOTE — Progress Notes (Signed)
Inpatient Follow-up/Progress Note   Patient ID: Jonathan [name] is a 67 y.o. male.  Overnight Events / Subjective Findings NAEON. No further episodes of nausea/vomiting. Epigastric discomfort improved. Had BM o/n and it was brown per pt report. Hgb relatively unchanged o/n. No other acute gi complaints.  Review of Systems  Constitutional:  Negative for activity change, appetite change, chills, diaphoresis, fatigue, fever and unexpected weight change.  HENT:  Positive for trouble swallowing. Negative for voice change.   Respiratory:  Negative for shortness of breath and wheezing.   Cardiovascular:  Negative for chest pain, palpitations and leg swelling.  Gastrointestinal:  Positive for nausea and vomiting. Negative for abdominal distention, abdominal pain, anal bleeding, blood in stool, constipation and diarrhea.  Musculoskeletal:  Negative for arthralgias and myalgias.  Skin:  Negative for color change and pallor.  Neurological:  Negative for dizziness, syncope and weakness.  Psychiatric/Behavioral:  Negative for confusion. The patient is not nervous/anxious.   All other systems reviewed and are negative.   Medications  Current Facility-Administered Medications:    acetaminophen (TYLENOL) tablet 975 mg, 975 mg, Oral, Q8H PRN, Marrion Coy, MD, 975 mg at 05/03/23 0810   atenolol (TENORMIN) tablet 50 mg, 50 mg, Oral, Daily, Marrion Coy, MD, 50 mg at 05/03/23 0810   cholecalciferol (VITAMIN D3) 25 MCG (1000 UNIT) tablet 5,000 Units, 5,000 Units, Oral, Daily, Marrion Coy, MD, 5,000 Units at 05/03/23 1308   levothyroxine (SYNTHROID) tablet 125 mcg, 125 mcg, Oral, q morning, Marrion Coy, MD, 125 mcg at 05/03/23 0624   lidocaine (XYLOCAINE) 2 % viscous mouth solution 15 mL, 15 mL, Mouth/Throat, Q4H PRN, Jaynie Collins, DO   pantoprazole (PROTONIX) injection 40 mg, 40 mg, Intravenous, Q12H, Marrion Coy, MD, 40 mg at 05/03/23 0812   acetaminophen, lidocaine   Objective     Vitals:   05/02/23 1832 05/02/23 2126 05/03/23 0243 05/03/23 0745  BP: 125/84 109/74 125/82 106/74  Pulse: 72 69 77 81  Resp: 18 18 20 18   Temp: 98.6 F (37 C) 98.4 F (36.9 C) 98.2 F (36.8 C) 98.3 F (36.8 C)  TempSrc: Oral Oral Oral Oral  SpO2: 100% 99% 99% 97%  Weight:      Height:         Physical Exam Vitals and nursing note reviewed.  Constitutional:      General: He is not in acute distress.    Appearance: Normal appearance. He is not ill-appearing, toxic-appearing or diaphoretic.  HENT:     Head: Normocephalic and atraumatic.     Nose: Nose normal.     Mouth/Throat:     Mouth: Mucous membranes are moist.     Pharynx: Oropharynx is clear.  Eyes:     General: No scleral icterus.    Extraocular Movements: Extraocular movements intact.  Cardiovascular:     Rate and Rhythm: Normal rate and regular rhythm.     Heart sounds: Normal heart sounds. No murmur heard.    No friction rub. No gallop.  Pulmonary:     Effort: Pulmonary effort is normal. No respiratory distress.     Breath sounds: Normal breath sounds. No wheezing, rhonchi or rales.  Abdominal:     General: Bowel sounds are normal. There is no distension.     Palpations: Abdomen is soft.     Tenderness: There is no abdominal tenderness. There is no guarding or rebound.  Musculoskeletal:     Cervical back: Neck supple.     Right lower leg: No edema.  Left lower leg: No edema.  Skin:    General: Skin is warm and dry.     Coloration: Skin is not jaundiced or pale.  Neurological:     General: No focal deficit present.     Mental Status: He is alert and oriented to person, place, and time. Mental status is at baseline.  Psychiatric:        Mood and Affect: Mood normal.        Behavior: Behavior normal.        Thought Content: Thought content normal.        Judgment: Judgment normal.      Laboratory Data Recent Labs  Lab 05/02/23 1133 05/02/23 1936 05/03/23 0404  WBC 10.1  --  7.1  HGB  9.3* 9.2* 8.7*  HCT 29.3*  --  27.7*  PLT 246  --  209   Recent Labs  Lab 05/02/23 1133 05/02/23 1141 05/03/23 0404  NA 135  --  137  K 4.0  --  3.7  CL 101  --  101  CO2 27  --  26  BUN 22  --  19  CREATININE 0.91  --  0.79  CALCIUM 8.9  --  8.8*  PROT  --  6.8 6.8  BILITOT  --  0.8 1.0  ALKPHOS  --  46 41  ALT  --  33 32  AST  --  22 22  GLUCOSE 107*  --  85   No results for input(s): "INR" in the last 168 hours.    Imaging Studies: CT ABDOMEN PELVIS W CONTRAST  Result Date: 05/02/2023 CLINICAL DATA:  Esophageal cancer, abdominal pain, vomiting, concern for esophageal erosion and GI bleeding EXAM: CT ABDOMEN AND PELVIS WITH CONTRAST TECHNIQUE: Multidetector CT imaging of the abdomen and pelvis was performed using the standard protocol following bolus administration of intravenous contrast. RADIATION DOSE REDUCTION: This exam was performed according to the departmental dose-optimization program which includes automated exposure control, adjustment of the mA and/or kV according to patient size and/or use of iterative reconstruction technique. CONTRAST:  75mL OMNIPAQUE IOHEXOL 350 MG/ML SOLN COMPARISON:  CT chest abdomen pelvis, 03/19/2023 FINDINGS: Lower chest: Please see separately reported examination of the chest. Hepatobiliary: Slightly diminished size of multiple hypodense liver lesions, largest index lesion of the posterior liver dome measuring 8.2 x 5.1 cm, previously 9.3 x 6.2 cm (series 4, image 12). Small gallstones. No gallbladder wall thickening, or biliary dilatation. Pancreas: Unremarkable. No pancreatic ductal dilatation or surrounding inflammatory changes. Spleen: Normal in size without significant abnormality. Adrenals/Urinary Tract: Diminished size of bilateral adrenal masses, on the right measuring 4.3 x 3.4 cm, previously 5.9 x 4.4 cm (series 4, image 19). Left-sided nodule is almost completely resolved and difficult to discretely measure (series 4, image 17). Simple,  benign left renal cortical cysts, for which no further follow-up or characterization is required. Small nonobstructive bilateral renal calculi. No ureteral calculi or hydronephrosis. Bladder is unremarkable. Stomach/Bowel: Stomach is within normal limits. Appendix appears normal. No evidence of bowel wall thickening, distention, or inflammatory changes. Descending and sigmoid diverticulosis. Vascular/Lymphatic: Aortic atherosclerosis. No enlarged abdominal or pelvic lymph nodes. Reproductive: No mass or other significant abnormality. Other: Small fat containing right inguinal hernia.  No ascites. Musculoskeletal: No acute or significant osseous findings. IMPRESSION: 1. No acute CT findings of the abdomen or pelvis to explain abdominal pain or vomiting. 2. Slightly diminished size of multiple hypodense liver lesions as well as bilateral adrenal masses, consistent with treatment response  of abdominal metastatic disease. 3. Cholelithiasis. 4. Nonobstructive bilateral renal calculi. 5. Descending and sigmoid diverticulosis without evidence of acute diverticulitis. Aortic Atherosclerosis (ICD10-I70.0). Electronically Signed   By: Jearld Lesch M.D.   On: 05/02/2023 14:43   CT Angio Chest PE W and/or Wo Contrast  Result Date: 05/02/2023 CLINICAL DATA:  PE suspected, vomiting, esophageal cancer * Tracking Code: BO * EXAM: CT ANGIOGRAPHY CHEST WITH CONTRAST TECHNIQUE: Multidetector CT imaging of the chest was performed using the standard protocol during bolus administration of intravenous contrast. Multiplanar CT image reconstructions and MIPs were obtained to evaluate the vascular anatomy. RADIATION DOSE REDUCTION: This exam was performed according to the departmental dose-optimization program which includes automated exposure control, adjustment of the mA and/or kV according to patient size and/or use of iterative reconstruction technique. CONTRAST:  75mL OMNIPAQUE IOHEXOL 350 MG/ML SOLN COMPARISON:  CT chest abdomen  pelvis, 03/19/2023 FINDINGS: Cardiovascular: Satisfactory opacification of the pulmonary arteries to the segmental level. No evidence of pulmonary embolism. Normal heart size. Left coronary artery calcifications. No pericardial effusion. Right chest port catheter. Aortic atherosclerosis. Mediastinum/Nodes: No enlarged mediastinal, hilar, or axillary lymph nodes. Circumferential wall thickening of the lower third of the esophagus (series 2, image 97). Thyroid gland and trachea demonstrate no significant findings. Lungs/Pleura: Perihilar and infrahilar fibrosis of the right lower lobe, unchanged in appearance compared to prior examination (series 3, image 62). Bibasilar scarring or atelectasis. No pleural effusion or pneumothorax. Upper Abdomen: Please see separately reported examination of the abdomen and pelvis. Musculoskeletal: No chest wall abnormality. No acute osseous findings. Review of the MIP images confirms the above findings. IMPRESSION: 1. Negative examination for pulmonary embolism. 2. Circumferential wall thickening of the lower third of the esophagus, consistent with known esophageal malignancy. 3. Unchanged fibrotic scarring and volume loss of the perihilar right lung and right lower lobe. 4. Coronary artery disease. Aortic Atherosclerosis (ICD10-I70.0). Electronically Signed   By: Jearld Lesch M.D.   On: 05/02/2023 14:36   DG Chest 2 View  Result Date: 05/02/2023 CLINICAL DATA:  History of esophageal cancer with chest tightness and epigastric pain EXAM: CHEST - 2 VIEW COMPARISON:  Chest radiograph dated 02/19/2023 FINDINGS: Lines/tubes: Right chest wall port tip projects over the superior cavoatrial junction. Lungs: Mildly low lung volumes. Well inflated lungs. Bilateral perihilar linear scarring. No focal consolidations. Pleura: Unchanged blunting of the right costophrenic angle. No pneumothorax. Heart/mediastinum: The heart size and mediastinal contours are within normal limits. Bones: No acute  osseous abnormality. IMPRESSION: 1. No acute cardiopulmonary process. 2. Unchanged blunting of the right costophrenic angle, which may represent a small pleural effusion or pleural thickening. Electronically Signed   By: Agustin Cree M.D.   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 VSS  Continue  full liquids Viscous lidocaine as needed for abdominal pain  Management of other medical comorbidities as per primary team  I personally performed the service.  Thank you for allowing [place] to participate in this patient's care. Please don't hesitate to call if any questions or concerns arise.   Jaynie Collins, DO [facility]  Portions of the record may have been created with voice recognition software. Occasional wrong-word or 'sound-a-like' substitutions may have occurred due to the inherent limitations of voice recognition software.  Read the chart carefully and recognize, using context, where substitutions may have occurred.

## 2023-05-04 DIAGNOSIS — K920 Hematemesis: Secondary | ICD-10-CM | POA: Diagnosis not present

## 2023-05-04 DIAGNOSIS — C159 Malignant neoplasm of esophagus, unspecified: Secondary | ICD-10-CM | POA: Diagnosis not present

## 2023-05-04 DIAGNOSIS — D62 Acute posthemorrhagic anemia: Secondary | ICD-10-CM | POA: Diagnosis present

## 2023-05-04 DIAGNOSIS — Z8501 Personal history of malignant neoplasm of esophagus: Secondary | ICD-10-CM | POA: Diagnosis not present

## 2023-05-04 DIAGNOSIS — K922 Gastrointestinal hemorrhage, unspecified: Secondary | ICD-10-CM | POA: Diagnosis not present

## 2023-05-04 DIAGNOSIS — K921 Melena: Secondary | ICD-10-CM | POA: Diagnosis not present

## 2023-05-04 LAB — HEMOGLOBIN: Hemoglobin: 8.5 g/dL — ABNORMAL LOW (ref 13.0–17.0)

## 2023-05-04 LAB — VITAMIN B12: Vitamin B-12: 594 pg/mL (ref 180–914)

## 2023-05-04 MED ORDER — SODIUM CHLORIDE 0.9 % IV SOLN
INTRAVENOUS | Status: DC
  Administered 2023-05-05: 20 mL/h via INTRAVENOUS

## 2023-05-04 NOTE — Plan of Care (Signed)

## 2023-05-04 NOTE — Progress Notes (Signed)
Progress Note   Patient: Jonathan [name] KVQ:[id number] DOB: Aug 08, 1955 DOA: 05/02/2023     2 DOS: the patient was seen and examined on 05/04/2023   Brief hospital course: APOLO SHARER is a 67 y.o. male with medical history significant of esophageal cancer, hypothyroidism, essential hypertension, history of DVT on Eliquis, who present to the hospital with coffee-ground emesis.  Patient is seen by GI, scheduled for EGD on Monday.   Principal Problem:   Acute upper GI bleeding Active Problems:   Adenocarcinoma of esophagus (HCC)   Obesity (BMI 30-39.9)   Deep venous thrombosis (HCC)   Normocytic anemia   Acute blood loss anemia   Assessment and Plan: Upper GI bleed with coffee-ground emesis. Esophageal cancer. Patient had some stomach discomfort for the last week, started to have nausea and coffee-ground emesis.  Possibility of gastritis versus peptic ulcer disease.  Will start Protonix 40 mg IV twice a day.   Another consideration is esophageal cancer bleeding. Hold off anticoagulation. Seen by GI, scheduled on colonoscopy Monday. Continue PPI. Hemoglobin still stable today.   DVT with IV port thrombosis. Patient had an episode of IV port thrombosis in May 2024, will hold off anticoagulation due to active bleeding.   Chronic anemia. Likely due to chemotherapy, hemoglobin stable, iron level was normal.  B12 level normal.   Essential hypertension Continue some home medicines   Hypothyroidism Continue Synthroid.       Subjective:  Patient has still had some black stools, no nausea vomiting  Physical Exam: Vitals:   05/03/23 0745 05/03/23 2158 05/04/23 0546 05/04/23 0751  BP: 106/74 (!) 98/57 100/69 101/73  Pulse: 81 90 89 87  Resp: 18 17  18   Temp: 98.3 F (36.8 C) 98.5 F (36.9 C) 98.4 F (36.9 C) 98.4 F (36.9 C)  TempSrc: Oral Oral Oral Oral  SpO2: 97% 96% 93% 96%  Weight:      Height:       General exam: Appears calm and comfortable   Respiratory system: Clear to auscultation. Respiratory effort normal. Cardiovascular system: S1 & S2 heard, RRR. No JVD, murmurs, rubs, gallops or clicks. No pedal edema. Gastrointestinal system: Abdomen is nondistended, soft and nontender. No organomegaly or masses felt. Normal bowel sounds heard. Central nervous system: Alert and oriented. No focal neurological deficits. Extremities: Symmetric 5 x 5 power. Skin: No rashes, lesions or ulcers Psychiatry: Judgement and insight appear normal. Mood & affect appropriate.    Data Reviewed:  Lab results reviewed.  Family Communication: None  Disposition: Status is: Inpatient Remains inpatient appropriate because: Severity of disease, IV tretment     Time spent: 35 minutes  Author: Marrion Coy, MD 05/04/2023 11:56 AM  For on call review [email or web address].

## 2023-05-04 NOTE — Plan of Care (Signed)
  Problem: Clinical Measurements: Goal: Ability to maintain clinical measurements within normal limits will improve Outcome: Progressing   Problem: Safety: Goal: Ability to remain free from injury will improve Outcome: Progressing   Problem: Pain Managment: Goal: General experience of comfort will improve Outcome: Progressing   Problem: Elimination: Goal: Will not experience complications related to urinary retention Outcome: Progressing   Problem: Skin Integrity: Goal: Risk for impaired skin integrity will decrease Outcome: Progressing

## 2023-05-04 NOTE — Progress Notes (Signed)
GI Inpatient Follow-up Note  Subjective:  Patient seen in follow-up for coffee-ground emesis/melena concerning for UGIB. No acute events overnight. He reports "hard and sticky" bowel movements last night and this morning. No gross hematochezia. No recurrent nausea or vomiting. He endorses mild epigastric discomfort. Hemoglobin this morning 8.5. No other concerns or questions at this time.   Scheduled Inpatient Medications:   atenolol  50 mg Oral Daily   cholecalciferol  5,000 Units Oral Daily   levothyroxine  125 mcg Oral q morning   melatonin  5 mg Oral QHS   pantoprazole (PROTONIX) IV  40 mg Intravenous Q12H   PRN Inpatient Medications:  acetaminophen, lidocaine  Review of Systems: Constitutional: Weight is stable.  Eyes: No changes in vision. ENT: No oral lesions, sore throat.  GI: see HPI.  Heme/Lymph: No easy bruising.  CV: No chest pain.  GU: No hematuria.  Integumentary: No rashes.  Neuro: No headaches.  Psych: No depression/anxiety.  Endocrine: No heat/cold intolerance.  Allergic/Immunologic: No urticaria.  Resp: No cough, SOB.  Musculoskeletal: No joint swelling.    Physical Examination: BP 101/73   Pulse 87   Temp 98.4 F (36.9 C) (Oral)   Resp 17   Ht 5' 8.5" (1.74 m)   Wt 97.1 kg   SpO2 96%   BMI 32.07 kg/m  Gen: NAD, alert and oriented x 4 HEENT: PEERLA, EOMI, Neck: supple, no JVD or thyromegaly Chest: CTA bilaterally, no wheezes, crackles, or other adventitious sounds CV: RRR, no m/g/c/r Abd: soft, NT, ND, +BS in all four quadrants; no HSM, guarding, ridigity, or rebound tenderness Ext: no edema, well perfused with 2+ pulses, Skin: no rash or lesions noted Lymph: no LAD  Data: Lab Results  Component Value Date   WBC 7.1 05/03/2023   HGB 8.5 (L) 05/04/2023   HCT 27.7 (L) 05/03/2023   MCV 86.3 05/03/2023   PLT 209 05/03/2023   Recent Labs  Lab 05/03/23 0404 05/03/23 1620 05/04/23 0326  HGB 8.7* 8.5* 8.5*   Lab Results  Component  Value Date   NA 137 05/03/2023   K 3.7 05/03/2023   CL 101 05/03/2023   CO2 26 05/03/2023   BUN 19 05/03/2023   CREATININE 0.79 05/03/2023   Lab Results  Component Value Date   ALT 32 05/03/2023   AST 22 05/03/2023   ALKPHOS 41 05/03/2023   BILITOT 1.0 05/03/2023   No results for input(s): "APTT", "INR", "PTT" in the last 168 hours.  Assessment/Plan:  67 y/o Caucasian male with a PMH of HTN, hx of DVT on chronic anticoagulation with Eliquis, hypothyroidism, and hx of metastatic esophageal adenocarcinoma (Stage IV) on systemic chemotherapy presented to the [facility] ED 10/4 for chief complaint of coffee-ground emesis concerning for UGIB  UGIB  Esophageal adenocarcinoma - Stage IV - with liver mets  Hx of DVT on Eliquis  Immunosuppression with systemic chemotherapy  Unintentional weight loss  Recommendations:  - Continue PPI BID for gastric protection - Continue to monitor serial H&H. Transfuse for Hgb <7.0.  - No signs of overt gastrointestinal bleeding at this time - Continue to monitor for signs of GI bleeding - Continue full liquid diet - Hold Eliquis and all antiplatelet therapy - Advise EGD tomorrow with Dr. Timothy Lasso for luminal evaluation - See procedure note for findings and further recommendations  I reviewed the risks (including bleeding, perforation, infection, anesthesia complications, cardiac/respiratory complications), benefits and alternatives of EGD. Patient consents to proceed.    Please call with questions or  concerns.  Jacob Moores, PA-C [facility] [phone]

## 2023-05-04 NOTE — TOC CM/SW Note (Signed)
Transition of Care [facility]) - Inpatient Brief Assessment   Patient Details  Name: Jonathan [name] MRN: [id number] Date of Birth: [birth year]  Transition of Care [facility]) CM/SW Contact:    Liliana Cline, LCSW Phone Number: 05/04/2023, 9:05 AM   Transition of Care Asessment: Insurance and Status: Insurance coverage has been reviewed Patient has primary care physician: Yes     Prior/Current Home Services: No current home services Social Determinants of Health Reivew: SDOH reviewed no interventions necessary Readmission risk has been reviewed: Yes Transition of care needs: no transition of care needs at this time

## 2023-05-05 ENCOUNTER — Encounter: Payer: Self-pay | Admitting: Oncology

## 2023-05-05 ENCOUNTER — Inpatient Hospital Stay: Payer: No Typology Code available for payment source | Admitting: Registered Nurse

## 2023-05-05 ENCOUNTER — Encounter
Admission: EM | Disposition: A | Discharge status: Home / Self Care | Payer: Self-pay | Source: Home / Self Care | Attending: Internal Medicine

## 2023-05-05 DIAGNOSIS — K2210 Ulcer of esophagus without bleeding: Secondary | ICD-10-CM | POA: Diagnosis not present

## 2023-05-05 DIAGNOSIS — D62 Acute posthemorrhagic anemia: Secondary | ICD-10-CM | POA: Diagnosis not present

## 2023-05-05 DIAGNOSIS — C159 Malignant neoplasm of esophagus, unspecified: Secondary | ICD-10-CM | POA: Diagnosis not present

## 2023-05-05 DIAGNOSIS — Z8501 Personal history of malignant neoplasm of esophagus: Secondary | ICD-10-CM

## 2023-05-05 DIAGNOSIS — I82621 Acute embolism and thrombosis of deep veins of right upper extremity: Secondary | ICD-10-CM | POA: Diagnosis not present

## 2023-05-05 DIAGNOSIS — K922 Gastrointestinal hemorrhage, unspecified: Secondary | ICD-10-CM | POA: Diagnosis not present

## 2023-05-05 HISTORY — PX: ESOPHAGOGASTRODUODENOSCOPY (EGD) WITH PROPOFOL: SHX5813

## 2023-05-05 HISTORY — PX: HEMOSTASIS CONTROL: SHX6838

## 2023-05-05 LAB — HEMOGLOBIN: Hemoglobin: 8.3 g/dL — ABNORMAL LOW (ref 13.0–17.0)

## 2023-05-05 SURGERY — ESOPHAGOGASTRODUODENOSCOPY (EGD) WITH PROPOFOL
Anesthesia: General

## 2023-05-05 MED ORDER — PROPOFOL 10 MG/ML IV BOLUS
INTRAVENOUS | Status: AC
  Filled 2023-05-05: qty 20

## 2023-05-05 MED ORDER — PROPOFOL 500 MG/50ML IV EMUL
INTRAVENOUS | Status: DC | PRN
  Administered 2023-05-05: 150 ug/kg/min via INTRAVENOUS

## 2023-05-05 MED ORDER — PROPOFOL 10 MG/ML IV BOLUS
INTRAVENOUS | Status: DC | PRN
  Administered 2023-05-05: 90 mg via INTRAVENOUS

## 2023-05-05 MED ORDER — LACTATED RINGERS IV SOLN: INTRAVENOUS | Status: DC

## 2023-05-05 MED ORDER — DEXMEDETOMIDINE HCL IN NACL 80 MCG/20ML IV SOLN
INTRAVENOUS | Status: DC | PRN
Start: 2023-05-05 — End: 2023-05-05
  Administered 2023-05-05: 8 ug via INTRAVENOUS

## 2023-05-05 MED ORDER — EPHEDRINE 5 MG/ML INJ
INTRAVENOUS | Status: AC
  Filled 2023-05-05: qty 5

## 2023-05-05 MED ORDER — PHENYLEPHRINE 80 MCG/ML (10ML) SYRINGE FOR IV PUSH (FOR BLOOD PRESSURE SUPPORT)
PREFILLED_SYRINGE | INTRAVENOUS | Status: AC
  Filled 2023-05-05: qty 20

## 2023-05-05 MED ORDER — LIDOCAINE HCL (CARDIAC) PF 100 MG/5ML IV SOSY
PREFILLED_SYRINGE | INTRAVENOUS | Status: DC | PRN
  Administered 2023-05-05: 40 mg via INTRAVENOUS

## 2023-05-05 MED ORDER — PHENYLEPHRINE 80 MCG/ML (10ML) SYRINGE FOR IV PUSH (FOR BLOOD PRESSURE SUPPORT)
PREFILLED_SYRINGE | INTRAVENOUS | Status: DC | PRN
  Administered 2023-05-05 (×2): 160 ug via INTRAVENOUS

## 2023-05-05 NOTE — Plan of Care (Signed)

## 2023-05-05 NOTE — Transfer of Care (Signed)
Immediate Anesthesia Transfer of Care Note  Patient: RAYBURN MUNDIS  Procedure(s) Performed: ESOPHAGOGASTRODUODENOSCOPY (EGD) WITH PROPOFOL HEMOSTASIS CONTROL  Patient Location: PACU and Endoscopy Unit  Anesthesia Type:General  Level of Consciousness: awake, alert , and oriented  Airway & Oxygen Therapy: Patient Spontanous Breathing  Post-op Assessment: Report given to RN and Post -op Vital signs reviewed and stable  Post vital signs: Reviewed and stable  Last Vitals:  Vitals Value Taken Time  BP 93/59 05/05/23 1305  Temp 36.1 C 05/05/23 1304  Pulse 74 05/05/23 1306  Resp 24 05/05/23 1306  SpO2 93 % 05/05/23 1306  Vitals shown include unfiled device data.  Last Pain:  Vitals:   05/05/23 1304  TempSrc: Temporal  PainSc: 0-No pain      Patients Stated Pain Goal: 0 (05/03/23 0855)  Complications: No notable events documented.

## 2023-05-05 NOTE — Anesthesia Preprocedure Evaluation (Signed)
Anesthesia Evaluation  Patient identified by MRN, date of birth, ID band Patient awake    Reviewed: Allergy & Precautions, H&P , NPO status , Patient's Chart, lab work & pertinent test results, reviewed documented beta blocker date and time   History of Anesthesia Complications (+) PONV and history of anesthetic complications  Airway Mallampati: II   Neck ROM: full    Dental  (+) Poor Dentition   Pulmonary neg pulmonary ROS   Pulmonary exam normal        Cardiovascular Exercise Tolerance: Poor hypertension, On Medications negative cardio ROS Normal cardiovascular exam Rhythm:regular Rate:Normal     Neuro/Psych  Neuromuscular disease  negative psych ROS   GI/Hepatic negative GI ROS, Neg liver ROS,,,  Endo/Other  Hypothyroidism    Renal/GU negative Renal ROS  negative genitourinary   Musculoskeletal   Abdominal   Peds  Hematology  (+) Blood dyscrasia, anemia   Anesthesia Other Findings Past Medical History: No date: Arthritis No date: Complication of anesthesia     Comment:  OCCURRED ONCE YEARS AGO 1981 No date: Esophageal mass No date: Hypertension No date: Hypothyroidism No date: PONV (postoperative nausea and vomiting) Past Surgical History: No date: COLONOSCOPY 08/23/2022: ESOPHAGOGASTRODUODENOSCOPY; N/A     Comment:  Procedure: ESOPHAGOGASTRODUODENOSCOPY (EGD);  Surgeon:               Jaynie Collins, DO;  Location: [facility] ENDOSCOPY;                Service: Gastroenterology;  Laterality: N/A; 09/12/2022: EUS; N/A     Comment:  Procedure: FULL UPPER ENDOSCOPIC ULTRASOUND (EUS)               RADIAL;  Surgeon: Bearl Mulberry, MD;  Location:               [facility] ENDOSCOPY;  Service: Gastroenterology;  Laterality:               N/A; No date: FINGER SURGERY 10/17/2022: PORTA CATH INSERTION; N/A     Comment:  Procedure: PORTA CATH INSERTION;  Surgeon: Annice Needy,              MD;  Location: [facility]  INVASIVE CV LAB;  Service:               Cardiovascular;  Laterality: N/A; No date: TENDON REPAIR IN LEFT KNEE BMI    Body Mass Index: 32.07 kg/m     Reproductive/Obstetrics negative OB ROS                             Anesthesia Physical Anesthesia Plan  ASA: 2  Anesthesia Plan: General   Post-op Pain Management:    Induction:   PONV Risk Score and Plan:   Airway Management Planned:   Additional Equipment:   Intra-op Plan:   Post-operative Plan:   Informed Consent: I have reviewed the patients History and Physical, chart, labs and discussed the procedure including the risks, benefits and alternatives for the proposed anesthesia with the patient or authorized representative who has indicated his/her understanding and acceptance.     Dental Advisory Given  Plan Discussed with: CRNA  Anesthesia Plan Comments:        Anesthesia Quick Evaluation

## 2023-05-05 NOTE — Op Note (Signed)
[facility] Gastroenterology Patient Name: Jonathan [name] Procedure Date: 05/05/2023 12:27 PM MRN: [id number] Account #: [id number] Date of Birth: 16-Oct-1955 Admit Type: Inpatient Age: 67 Room: [facility] ENDO ROOM 1 Gender: Male Note Status: Finalized Instrument Name: Upper Endoscope [id number] Procedure:             Upper GI endoscopy Indications:           Suspected upper gastrointestinal bleeding Providers:             Trenda Moots, DO Referring MD:          Rhona Leavens. Burnett Sheng, MD (Referring MD) Medicines:             Monitored Anesthesia Care Complications:         No immediate complications. Estimated blood loss:                         Minimal. Procedure:             Pre-Anesthesia Assessment:                        - Prior to the procedure, a History and Physical was                         performed, and patient medications and allergies were                         reviewed. The patient is competent. The risks and                         benefits of the procedure and the sedation options and                         risks were discussed with the patient. All questions                         were answered and informed consent was obtained.                         Patient identification and proposed procedure were                         verified by the physician, the nurse, the anesthetist                         and the technician in the endoscopy suite. Mental                         Status Examination: alert and oriented. Airway                         Examination: normal oropharyngeal airway and neck                         mobility. Respiratory Examination: clear to                         auscultation. CV Examination: RRR, no murmurs, no S3  or S4. Prophylactic Antibiotics: The patient does not                         require prophylactic antibiotics. Prior                         Anticoagulants: The patient has taken Eliquis                          (apixaban), last dose was 3 days prior to procedure.                         ASA Grade Assessment: III - A patient with severe                         systemic disease. After reviewing the risks and                         benefits, the patient was deemed in satisfactory                         condition to undergo the procedure. The anesthesia                         plan was to use monitored anesthesia care (MAC).                         Immediately prior to administration of medications,                         the patient was re-assessed for adequacy to receive                         sedatives. The heart rate, respiratory rate, oxygen                         saturations, blood pressure, adequacy of pulmonary                         ventilation, and response to care were monitored                         throughout the procedure. The physical status of the                         patient was re-assessed after the procedure.                        After obtaining informed consent, the endoscope was                         passed under direct vision. Throughout the procedure,                         the patient's blood pressure, pulse, and oxygen                         saturations were monitored continuously. The Endoscope  was introduced through the mouth, and advanced to the                         second part of duodenum. The upper GI endoscopy was                         accomplished without difficulty. The patient tolerated                         the procedure well. Findings:      The duodenal bulb, first portion of the duodenum and second portion of       the duodenum were normal. Estimated blood loss: none.      Localized mild mucosal changes characterized by inflammation were found       in the cardia. Suspect secondary too radiation treatment. Potentially       extension of disease, however, this was not biopsied due to active        bleeding and known advanced esophageal adenocarcinoma. Estimated blood       loss: none.      Severe esophagitis with bleeding was found 31 to 36 cm from the       incisors. Likely secondary to known malignancy, chemotherapy and       radation. Area was ulcerated and oozing. Initially tried 3cc of pura       state on active oozing site without sucess. To stop active bleeding,       hemostatic spray was deployed. Multiple sprays were applied. There was       no bleeding at the end of the procedure. Hemaspray applied.      Esophagogastric landmarks were identified: the Z-line was found at 36 cm       from the incisors. Impression:            - Normal duodenal bulb, first portion of the duodenum                         and second portion of the duodenum.                        - Inflamed mucosa in the cardia.                        - Severe chemotherapy/radiation esophagitis with                         bleeding. Hemostatic spray applied.                        - Esophagogastric landmarks identified.                        - No specimens collected. Recommendation:        - Patient has a contact number available for                         emergencies. The signs and symptoms of potential                         delayed complications were discussed with the patient.  Return to normal activities tomorrow. Written                         discharge instructions were provided to the patient.                        - Discharge patient to home.                        - NPO for 4 hours.                        - Full liquid diet after NPO.                        - Continue present medications.                        - Continue twice a day ppi going forward                        - No aspirin, ibuprofen, naproxen, or other                         non-steroidal anti-inflammatory drugs.                        - Repeat upper endoscopy PRN for retreatment.                         - Unfortuantely, this will likely be a recurring                         issues. Risks/benefits of repeated egd with                         temporizing bleeding measures will be limited.                        This was discussed with his hospitalist and his                         oncologist.                        Recommend IVC filter placement with his DVT and                         discontinuation of Eliquis due to recurrent bleeding.                        - The findings and recommendations were discussed with                         the patient. Procedure Code(s):     --- Professional ---                        [phone], Esophagogastroduodenoscopy, flexible,                         transoral; with control of bleeding, any method  Diagnosis Code(s):     --- Professional ---                        K29.70, Gastritis, unspecified, without bleeding                        T66.XXXA, Radiation sickness, unspecified, initial                         encounter                        K20.81, Other esophagitis with bleeding CPT copyright 2022 American Medical Association. All rights reserved. The codes documented in this report are preliminary and upon coder review may  be revised to meet current compliance requirements. Attending Participation:      I personally performed the entire procedure. Elfredia Nevins, DO Jaynie Collins DO, DO 05/05/2023 1:09:11 PM This report has been signed electronically. Number of Addenda: 0 Note Initiated On: 05/05/2023 12:27 PM Estimated Blood Loss:  Estimated blood loss was minimal.      [facility]

## 2023-05-05 NOTE — Progress Notes (Addendum)
Progress Note   Patient: Jonathan [name] QVZ:[id number] DOB: 26-May-1956 DOA: 05/02/2023     3 DOS: the patient was seen and examined on 05/05/2023   Brief hospital course: Jonathan [name] is a 67 y.o. male with medical history significant of esophageal cancer, hypothyroidism, essential hypertension, history of DVT on Eliquis, who present to the hospital with coffee-ground emesis.  Patient is seen by GI, scheduled for EGD on Monday.   Principal Problem:   Acute upper GI bleeding Active Problems:   Adenocarcinoma of esophagus (HCC)   Obesity (BMI 30-39.9)   Deep venous thrombosis (HCC)   Normocytic anemia   Acute blood loss anemia   Assessment and Plan: Upper GI bleed with coffee-ground emesis. Esophageal cancer with bleeding. Patient had some stomach discomfort for the last week, started to have nausea and coffee-ground emesis.    Started 40 mg IV twice a day.   EGD was performed on 10/7, showed bleeding from esophageal cancer. Not able to restart anticoagulation, recommended IVC filter placement.   DVT with IV port thrombosis. Placed IVC filter, discontinue Eliquis permanently.   Chronic anemia. Acute blood loss anemia. Chronic anemia was due to chemotherapy, patient also has gradual decrease of hemoglobin due to bleeding.  Continue to follow, transfuse as needed.   Essential hypertension Continue some home medicines   Hypothyroidism Continue Synthroid.      Subjective:  Patient doing well, did not see additional black stool today.  Physical Exam: Vitals:   05/05/23 0817 05/05/23 1144 05/05/23 1304 05/05/23 1305  BP: 108/68 101/69 (!) 93/59 (!) 93/59  Pulse: 81 79  77  Resp: 18 20  17   Temp: 98.4 F (36.9 C) 97.9 F (36.6 C) (!) 97 F (36.1 C)   TempSrc: Oral Temporal Temporal   SpO2: 99% 92%  93%  Weight:      Height:       General exam: Appears calm and comfortable  Respiratory system: Clear to auscultation. Respiratory effort  normal. Cardiovascular system: S1 & S2 heard, RRR. No JVD, murmurs, rubs, gallops or clicks. No pedal edema. Gastrointestinal system: Abdomen is nondistended, soft and nontender. No organomegaly or masses felt. Normal bowel sounds heard. Central nervous system: Alert and oriented. No focal neurological deficits. Extremities: Symmetric 5 x 5 power. Skin: No rashes, lesions or ulcers Psychiatry: Judgement and insight appear normal. Mood & affect appropriate.    Data Reviewed:  Lab results reviewed.  Family Communication: None  Disposition: Status is: Inpatient Remains inpatient appropriate because: Severity of disease.  Inpatient procedure.     Time spent: 35 minutes  Author: Marrion Coy, MD 05/05/2023 1:09 PM  For on call review [email or web address].

## 2023-05-05 NOTE — Anesthesia Postprocedure Evaluation (Signed)
Anesthesia Post Note  Patient: KATRINA BROSH  Procedure(s) Performed: ESOPHAGOGASTRODUODENOSCOPY (EGD) WITH PROPOFOL HEMOSTASIS CONTROL  Patient location during evaluation: PACU Anesthesia Type: General Level of consciousness: awake and alert Pain management: pain level controlled Vital Signs Assessment: post-procedure vital signs reviewed and stable Respiratory status: spontaneous breathing, nonlabored ventilation, respiratory function stable and patient connected to nasal cannula oxygen Cardiovascular status: blood pressure returned to baseline and stable Postop Assessment: no apparent nausea or vomiting Anesthetic complications: no   No notable events documented.   Last Vitals:  Vitals:   05/05/23 1304 05/05/23 1305  BP: (!) 93/59 (!) 93/59  Pulse:  77  Resp:  17  Temp: (!) 36.1 C   SpO2:  93%    Last Pain:  Vitals:   05/05/23 1304  TempSrc: Temporal  PainSc: 0-No pain                 Yevette Edwards

## 2023-05-05 NOTE — Care Plan (Signed)
Brief GI Care Plan  Patient post EGD Ulceration from 31 to 36 cm from the incisors secondary to esophageal cancer, chemoradiation The proximal portion was actively using upon entry. Initially attempted to treatment purastat without success Decision was made to apply Hemospray  Continue twice daily PPI Recommend placement of IVC filter and discontinuation of Eliquis/anticoagulation  Hospitalist team has consulted vascular surgery  Avoid aspirin and NSAIDs (ibuprofen, Motrin, Advil, naproxen, Aleve, Naprosyn, Goody's powder, & BC powder).  Unfortunately, there is no further intervention available to treat this area. Discussed case with hospitalist, oncologist  GI to sign off. Available as needed. Please do not hesitate to call regarding questions or concerns.  Enis Slipper, DO [facility] Gastroenterology

## 2023-05-05 NOTE — Care Management Important Message (Signed)
Important Message  Patient Details  Name: Jonathan [name] MRN: [id number] Date of Birth: Aug 14, 1955   Important Message Given:  Yes - Medicare IM     Curby Carswell, Stephan Minister 05/05/2023, 3:32 PM

## 2023-05-05 NOTE — Consult Note (Signed)
Hospital Consult    Reason for Consult:  Upper GI Bleed needing IVC Filter Requesting Physician:  Dr Marrion Coy MD MRN #:  [id number]  History of Present Illness: This is a 67 y.o. male  with medical history significant of esophageal cancer, hypothyroidism, essential hypertension, history of DVT on Eliquis, who present to the hospital with coffee-ground emesis. Patient underwent Upper endoscopy this morning. 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 Attempts made by Dr Timothy Lasso helped temporize the patients bleeding. Patient has a history of deep vein thrombosis and has been on anticoagulation. Patient will now need to stop anticoagulation. Vascular Surgery consulted to evaluate for IVC filter placement.   Past Medical History:  Diagnosis Date   Arthritis    Complication of anesthesia    OCCURRED ONCE YEARS AGO 1981   Esophageal mass    Hypertension    Hypothyroidism    PONV (postoperative nausea and vomiting)     Past Surgical History:  Procedure Laterality Date   COLONOSCOPY     ESOPHAGOGASTRODUODENOSCOPY N/A 08/23/2022   Procedure: ESOPHAGOGASTRODUODENOSCOPY (EGD);  Surgeon: Jaynie Collins, DO;  Location: [facility] ENDOSCOPY;  Service: Gastroenterology;  Laterality: N/A;   EUS N/A 09/12/2022   Procedure: FULL UPPER ENDOSCOPIC ULTRASOUND (EUS) RADIAL;  Surgeon: Bearl Mulberry, MD;  Location: [facility] ENDOSCOPY;  Service: Gastroenterology;  Laterality: N/A;   FINGER SURGERY     PORTA CATH INSERTION N/A 10/17/2022   Procedure: PORTA CATH INSERTION;  Surgeon: Annice Needy, MD;  Location: [facility] INVASIVE CV LAB;  Service: Cardiovascular;  Laterality: N/A;   TENDON REPAIR IN LEFT KNEE      No Known Allergies  Prior to Admission medications   Medication Sig Start Date End Date Taking? Authorizing Provider  acetaminophen (TYLENOL) 650 MG CR tablet Take 1,300 mg by mouth every 8 (eight) hours as needed for pain.   Yes [provider]  amLODipine  (NORVASC) 10 MG tablet Take 10 mg by mouth daily. 11/22/22  Yes [provider]  apixaban (ELIQUIS) 2.5 MG TABS tablet Take 1 tablet (2.5 mg total) by mouth 2 (two) times daily. 04/25/23  Yes Rickard Patience, MD  atenolol (TENORMIN) 50 MG tablet Take 50 mg by mouth daily.   Yes [provider]  azelastine (ASTELIN) 0.1 % nasal spray Place 1 spray into both nostrils 2 (two) times daily. Use in each nostril as directed 02/12/23  Yes Rickard Patience, MD  benzonatate (TESSALON) 200 MG capsule Take 1 capsule (200 mg total) by mouth 3 (three) times daily as needed for cough. 02/19/23  Yes Covington, Sarah M, PA-C  chlorhexidine (PERIDEX) 0.12 % solution USE AS DIRECTED TAKE  15  ML  IN  THE  MOUTH  OR  THROAT  TWICE  DAILY 04/15/23  Yes Rickard Patience, MD  Cholecalciferol (VITAMIN D3) 125 MCG (5000 UT) CAPS Take 5,000 Units by mouth daily.   Yes [provider]  hydrochlorothiazide (HYDRODIURIL) 25 MG tablet Take 25 mg by mouth daily. 06/09/22  Yes [provider]  Iron-Vitamin C 65-125 MG TABS Take 1 tablet by mouth daily. 03/27/23  Yes Rickard Patience, MD  levothyroxine (SYNTHROID) 125 MCG tablet Take 125 mcg by mouth every morning. 12/20/22  Yes [provider]  Multiple Vitamins-Minerals (MULTIVITAMIN WITH MINERALS) tablet Take 1 tablet by mouth daily.   Yes [provider]  omeprazole (PRILOSEC) 20 MG capsule Take 20 mg by mouth daily. 08/16/22 08/16/23 Yes [provider]  ondansetron (ZOFRAN) 8 MG tablet  Take 1 tablet (8 mg total) by mouth every 8 (eight) hours as needed for nausea or vomiting. Start on the third day after chemotherapy. 02/17/23  Yes Rickard Patience, MD  potassium chloride SA (KLOR-CON M) 20 MEQ tablet Take 2 tablets (40 mEq total) by mouth 2 (two) times daily. 02/12/23  Yes Rickard Patience, MD  predniSONE (DELTASONE) 10 MG tablet Take 1 tablet (10 mg total) by mouth daily with breakfast. Take with your 20mg  tablets. Take 30mg  daily for 2 weeks. 04/25/23  Yes Rickard Patience, MD   predniSONE (DELTASONE) 20 MG tablet Take 2 tablets (40 mg total) by mouth daily with breakfast. 04/11/23  Yes Rickard Patience, MD  prochlorperazine (COMPAZINE) 10 MG tablet Take 1 tablet (10 mg total) by mouth every 6 (six) hours as needed for nausea or vomiting. 02/17/23  Yes Rickard Patience, MD  cetirizine (ZYRTEC ALLERGY) 10 MG tablet Take 1 tablet (10 mg total) by mouth at bedtime. Patient not taking: Reported on 03/27/2023 02/19/23 02/19/24  Rushie Chestnut, PA-C  hydrocortisone-nystatin-lidocaine in diphenhydrAMINE liquid Swish and swallow 5 mLs by mouth 3 (three) times daily as needed for mouth pain. Patient not taking: Reported on 04/11/2023 01/21/23   Borders, Daryl Eastern, NP    Social History   Socioeconomic History   Marital status: Single    Spouse name: Not on file   Number of children: Not on file   Years of education: Not on file   Highest education level: Not on file  Occupational History   Not on file  Tobacco Use   Smoking status: Never   Smokeless tobacco: Never  Vaping Use   Vaping status: Never Used  Substance and Sexual Activity   Alcohol use: Yes    Comment: OCCASIONALLY   Drug use: Never   Sexual activity: Not on file  Other Topics Concern   Not on file  Social History Narrative   Not on file   Social Determinants of Health   Financial Resource Strain: Low Risk  (10/29/2022)   Received from [facility], [facility]   Overall Financial Resource Strain (CARDIA)    Difficulty of Paying Living Expenses: Not hard at all  Food Insecurity: No Food Insecurity (05/03/2023)   Hunger Vital Sign    Worried About Running Out of Food in the Last Year: Never true    Ran Out of Food in the Last Year: Never true  Transportation Needs: No Transportation Needs (05/03/2023)   PRAPARE - Administrator, Civil Service (Medical): No    Lack of Transportation (Non-Medical): No  Physical Activity: Not on file  Stress: Not on file  Social  Connections: Not on file  Intimate Partner Violence: Not At Risk (05/03/2023)   Humiliation, Afraid, Rape, and Kick questionnaire    Fear of Current or Ex-Partner: No    Emotionally Abused: No    Physically Abused: No    Sexually Abused: No     Family History  Problem Relation Age of Onset   Heart attack Mother    Prostate cancer Father    Breast cancer Sister    Prostate cancer Paternal Uncle     ROS: Otherwise negative unless mentioned in HPI  Physical Examination  Vitals:   05/05/23 1304 05/05/23 1305  BP: (!) 93/59 (!) 93/59  Pulse:  77  Resp:  17  Temp: (!) 97 F (36.1 C)   SpO2:  93%   Body mass index is 32.07 kg/m.  General:  WDWN in NAD Gait: Not observed HENT: WNL, normocephalic Pulmonary: normal non-labored breathing, without Rales, rhonchi,  wheezing Cardiac: regular, without  Murmurs, rubs or gallops; without carotid bruits Abdomen: Positive bowel sounds,  soft, NT/ND, no masses Skin: without rashes Vascular Exam/Pulses: Palpable pulses throughout. Extremities: without ischemic changes, without Gangrene , without cellulitis; without open wounds;  Musculoskeletal: no muscle wasting or atrophy  Neurologic: A&O X 3;  No focal weakness or paresthesias are detected; speech is fluent/normal Psychiatric:  The pt has Normal affect. Lymph:  Unremarkable  CBC    Component Value Date/Time   WBC 7.1 05/03/2023 0404   RBC 3.21 (L) 05/03/2023 0404   HGB 8.3 (L) 05/05/2023 0418   HGB 10.0 (L) 04/25/2023 0825   HCT 27.7 (L) 05/03/2023 0404   PLT 209 05/03/2023 0404   PLT 206 04/25/2023 0825   MCV 86.3 05/03/2023 0404   MCH 27.1 05/03/2023 0404   MCHC 31.4 05/03/2023 0404   RDW 20.2 (H) 05/03/2023 0404   LYMPHSABS 1.8 04/25/2023 0825   MONOABS 0.9 04/25/2023 0825   EOSABS 0.0 04/25/2023 0825   BASOSABS 0.0 04/25/2023 0825    BMET    Component Value Date/Time   NA 137 05/03/2023 0404   K 3.7 05/03/2023 0404   CL 101 05/03/2023 0404   CO2 26  05/03/2023 0404   GLUCOSE 85 05/03/2023 0404   BUN 19 05/03/2023 0404   CREATININE 0.79 05/03/2023 0404   CREATININE 0.87 04/25/2023 0825   CALCIUM 8.8 (L) 05/03/2023 0404   GFRNONAA >60 05/03/2023 0404   GFRNONAA >60 04/25/2023 0825    COAGS: Lab Results  Component Value Date   INR 1.1 10/10/2022     Non-Invasive Vascular Imaging:    EXAM:10/28/22 RIGHT UPPER EXTREMITY VENOUS DOPPLER ULTRASOUND   TECHNIQUE: Gray-scale sonography with graded compression, as well as color Doppler and duplex ultrasound were performed to evaluate the upper extremity deep venous system from the level of the subclavian vein and including the jugular, axillary, basilic, radial, ulnar and upper cephalic vein. Spectral Doppler was utilized to evaluate flow at rest and with distal augmentation maneuvers.   COMPARISON:  None Available.   FINDINGS: Contralateral Subclavian Vein: Respiratory phasicity is normal and symmetric with the symptomatic side. No evidence of thrombus. Normal compressibility.   Internal Jugular Vein: Occlusive thrombus.  Noncompressible.   Subclavian Vein: No evidence of thrombus. Normal compressibility, respiratory phasicity and response to augmentation.   Axillary Vein: No evidence of thrombus. Normal compressibility, respiratory phasicity and response to augmentation.   Cephalic Vein: No evidence of thrombus. Normal compressibility, respiratory phasicity and response to augmentation.   Basilic Vein: No evidence of thrombus. Normal compressibility, respiratory phasicity and response to augmentation.   Brachial Veins: No evidence of thrombus. Normal compressibility, respiratory phasicity and response to augmentation.   Radial Veins: No evidence of thrombus. Normal compressibility, respiratory phasicity and response to augmentation.   Ulnar Veins: No evidence of thrombus. Normal compressibility, respiratory phasicity and response to augmentation.   Venous Reflux:   None visualized.   Other Findings:  None visualized.   IMPRESSION: 1. Occlusive deep venous thrombosis within the visualized right internal jugular vein. 2. No deep venous thrombosis of the right upper extremity.  EXAM:05/02/23 CT ABDOMEN AND PELVIS WITH CONTRAST   TECHNIQUE: Multidetector CT imaging of the abdomen and pelvis was performed using the standard protocol following bolus administration of intravenous contrast.   RADIATION DOSE REDUCTION: This exam was performed according to the departmental dose-optimization program which includes  automated exposure control, adjustment of the mA and/or kV according to patient size and/or use of iterative reconstruction technique.   CONTRAST:  75mL OMNIPAQUE IOHEXOL 350 MG/ML SOLN   COMPARISON:  CT chest abdomen pelvis, 03/19/2023   FINDINGS: Lower chest: Please see separately reported examination of the chest.   Hepatobiliary: Slightly diminished size of multiple hypodense liver lesions, largest index lesion of the posterior liver dome measuring 8.2 x 5.1 cm, previously 9.3 x 6.2 cm (series 4, image 12). Small gallstones. No gallbladder wall thickening, or biliary dilatation.   Pancreas: Unremarkable. No pancreatic ductal dilatation or surrounding inflammatory changes.   Spleen: Normal in size without significant abnormality.   Adrenals/Urinary Tract: Diminished size of bilateral adrenal masses, on the right measuring 4.3 x 3.4 cm, previously 5.9 x 4.4 cm (series 4, image 19). Left-sided nodule is almost completely resolved and difficult to discretely measure (series 4, image 17). Simple, benign left renal cortical cysts, for which no further follow-up or characterization is required. Small nonobstructive bilateral renal calculi. No ureteral calculi or hydronephrosis. Bladder is unremarkable.   Stomach/Bowel: Stomach is within normal limits. Appendix appears normal. No evidence of bowel wall thickening, distention,  or inflammatory changes. Descending and sigmoid diverticulosis.   Vascular/Lymphatic: Aortic atherosclerosis. No enlarged abdominal or pelvic lymph nodes.   Reproductive: No mass or other significant abnormality.   Other: Small fat containing right inguinal hernia.  No ascites.   Musculoskeletal: No acute or significant osseous findings.   IMPRESSION: 1. No acute CT findings of the abdomen or pelvis to explain abdominal pain or vomiting. 2. Slightly diminished size of multiple hypodense liver lesions as well as bilateral adrenal masses, consistent with treatment response of abdominal metastatic disease. 3. Cholelithiasis. 4. Nonobstructive bilateral renal calculi. 5. Descending and sigmoid diverticulosis without evidence of acute diverticulitis.  EXAM:05/02/23 CT ANGIOGRAPHY CHEST WITH CONTRAST   TECHNIQUE: Multidetector CT imaging of the chest was performed using the standard protocol during bolus administration of intravenous contrast. Multiplanar CT image reconstructions and MIPs were obtained to evaluate the vascular anatomy.   RADIATION DOSE REDUCTION: This exam was performed according to the departmental dose-optimization program which includes automated exposure control, adjustment of the mA and/or kV according to patient size and/or use of iterative reconstruction technique.   CONTRAST:  75mL OMNIPAQUE IOHEXOL 350 MG/ML SOLN   COMPARISON:  CT chest abdomen pelvis, 03/19/2023   FINDINGS: Cardiovascular: Satisfactory opacification of the pulmonary arteries to the segmental level. No evidence of pulmonary embolism. Normal heart size. Left coronary artery calcifications. No pericardial effusion. Right chest port catheter. Aortic atherosclerosis.   Mediastinum/Nodes: No enlarged mediastinal, hilar, or axillary lymph nodes. Circumferential wall thickening of the lower third of the esophagus (series 2, image 97). Thyroid gland and trachea demonstrate no significant  findings.   Lungs/Pleura: Perihilar and infrahilar fibrosis of the right lower lobe, unchanged in appearance compared to prior examination (series 3, image 62). Bibasilar scarring or atelectasis. No pleural effusion or pneumothorax.   Upper Abdomen: Please see separately reported examination of the abdomen and pelvis.   Musculoskeletal: No chest wall abnormality. No acute osseous findings.   Review of the MIP images confirms the above findings.   IMPRESSION: 1. Negative examination for pulmonary embolism. 2. Circumferential wall thickening of the lower third of the esophagus, consistent with known esophageal malignancy. 3. Unchanged fibrotic scarring and volume loss of the perihilar right lung and right lower lobe. 4. Coronary artery disease.   Statin:  No. Beta Blocker:  Yes.  Aspirin:  No. ACEI:  No. ARB:  No. CCB use:  Yes Other antiplatelets/anticoagulants:  Yes.   Eliquis 2.5 mg twice daily   ASSESSMENT/PLAN: This is a 67 y.o. male with a history of advanced esophageal cancer and upper extremity thrombosis including the right internal jugular vein. Patient has been on eliquis which will have to be stopped due to esophageal bleeding. Vascular Surgery Consulted   The only noted thrombus this patient has is located in his internal jugular vein most likely due to his porta cath.  The patient does not appear to have any DVT in his lower extremities that I can find. Therefore an IVC filter is not recommended at this time.   -I discussed in detail the patients history and current findings and he is in agreement the patient does not need an IVC filter at this time.    Marcie Bal [facility] 05/05/2023 1:12 PM

## 2023-05-06 ENCOUNTER — Encounter: Payer: Self-pay | Admitting: Gastroenterology

## 2023-05-06 DIAGNOSIS — C159 Malignant neoplasm of esophagus, unspecified: Secondary | ICD-10-CM | POA: Diagnosis not present

## 2023-05-06 DIAGNOSIS — K922 Gastrointestinal hemorrhage, unspecified: Secondary | ICD-10-CM | POA: Diagnosis not present

## 2023-05-06 DIAGNOSIS — D62 Acute posthemorrhagic anemia: Secondary | ICD-10-CM | POA: Diagnosis not present

## 2023-05-06 LAB — CBC
HCT: 26 % — ABNORMAL LOW (ref 39.0–52.0)
Hemoglobin: 7.9 g/dL — ABNORMAL LOW (ref 13.0–17.0)
MCH: 26.7 pg (ref 26.0–34.0)
MCHC: 30.4 g/dL (ref 30.0–36.0)
MCV: 87.8 fL (ref 80.0–100.0)
Platelets: 154 10*3/uL (ref 150–400)
RBC: 2.96 MIL/uL — ABNORMAL LOW (ref 4.22–5.81)
RDW: 20.7 % — ABNORMAL HIGH (ref 11.5–15.5)
WBC: 3.7 10*3/uL — ABNORMAL LOW (ref 4.0–10.5)
nRBC: 0 % (ref 0.0–0.2)

## 2023-05-06 LAB — RETIC PANEL
Immature Retic Fract: 30.4 % — ABNORMAL HIGH (ref 2.3–15.9)
RBC.: 2.85 MIL/uL — ABNORMAL LOW (ref 4.22–5.81)
Retic Count, Absolute: 118.3 10*3/uL (ref 19.0–186.0)
Retic Ct Pct: 4.2 % — ABNORMAL HIGH (ref 0.4–3.1)
Reticulocyte Hemoglobin: 25.6 pg — ABNORMAL LOW (ref >27.9)

## 2023-05-06 LAB — BASIC METABOLIC PANEL
Anion gap: 6 (ref 5–15)
BUN: 12 mg/dL (ref 8–23)
CO2: 24 mmol/L (ref 22–32)
Calcium: 8.3 mg/dL — ABNORMAL LOW (ref 8.9–10.3)
Chloride: 105 mmol/L (ref 98–111)
Creatinine, Ser: 0.99 mg/dL (ref 0.61–1.24)
GFR, Estimated: >60 mL/min (ref >60)
Glucose, Bld: 93 mg/dL (ref 70–99)
Potassium: 3.4 mmol/L — ABNORMAL LOW (ref 3.5–5.1)
Sodium: 135 mmol/L (ref 135–145)

## 2023-05-06 LAB — MAGNESIUM: Magnesium: 2.3 mg/dL (ref 1.7–2.4)

## 2023-05-06 MED ORDER — POTASSIUM CHLORIDE CRYS ER 20 MEQ PO TBCR
40.0000 meq | EXTENDED_RELEASE_TABLET | ORAL | Status: AC
  Administered 2023-05-06 (×2): 40 meq via ORAL
  Filled 2023-05-06 (×2): qty 2

## 2023-05-06 MED ORDER — SODIUM CHLORIDE 0.9 % IV SOLN
200.0000 mg | Freq: Every day | INTRAVENOUS | Status: AC
  Administered 2023-05-06 – 2023-05-08 (×3): 200 mg via INTRAVENOUS
  Filled 2023-05-06 (×3): qty 200

## 2023-05-06 NOTE — Plan of Care (Signed)

## 2023-05-06 NOTE — Progress Notes (Addendum)
Progress Note   Patient: Jonathan [name] ZOX:[id number] DOB: 21-Oct-1955 DOA: 05/02/2023     4 DOS: the patient was seen and examined on 05/06/2023   Brief hospital course: Jonathan [name] is a 67 y.o. male with medical history significant of esophageal cancer, hypothyroidism, essential hypertension, history of DVT on Eliquis, who present to the hospital with coffee-ground emesis.  EGD 10/7 showed bleeding from esophageal cancer, esophagitis due to chemo and radiation.  Applied Hemospray.   hemoglobin still trending down.   Principal Problem:   Upper GI bleed Active Problems:   Adenocarcinoma of esophagus (HCC)   Obesity (BMI 30-39.9)   Deep venous thrombosis (HCC)   Normocytic anemia   Acute blood loss anemia   History of esophageal cancer   Assessment and Plan: Upper GI bleed with coffee-ground emesis. Esophageal cancer with bleeding. Patient had some stomach discomfort for the last week, started to have nausea and coffee-ground emesis.    Started 40 mg IV twice a day.   EGD was performed on 10/7, results as above.  However, patient hemoglobin still trending down.  Obtain opinion from radiation oncology to see if there is any role for radiation. Continue monitor hemoglobin, avoid excessive blood draw.    DVT with IV port thrombosis. Seen by vascular surgery, no role for IVC filter due to the fact that DVT was in the upper arms of IV port. Patient would not be able to start any anticoagulation.   Chronic anemia. Acute blood loss anemia. Chronic anemia was due to chemotherapy, patient also has gradual decrease of hemoglobin due to bleeding.  Continue to follow, transfuse as needed.  Hypokalemia. Repleted.   Essential hypertension Continue some home medicines   Hypothyroidism Continue Synthroid.           Subjective:  Feels well today, no nausea vomiting.  Still has some black stools occasionally.  Physical Exam: Vitals:   05/05/23 1648 05/05/23 1957  05/06/23 0331 05/06/23 0734  BP: 102/69 132/67 91/66 111/71  Pulse: 93 88 83 90  Resp: 18 20 16 20   Temp: 98.3 F (36.8 C) 98.8 F (37.1 C) 99.1 F (37.3 C) 97.9 F (36.6 C)  TempSrc:  Oral Oral Oral  SpO2: 96% 90% 93% 95%  Weight:      Height:       General exam: Appears calm and comfortable  Respiratory system: Clear to auscultation. Respiratory effort normal. Cardiovascular system: S1 & S2 heard, RRR. No JVD, murmurs, rubs, gallops or clicks. No pedal edema. Gastrointestinal system: Abdomen is nondistended, soft and nontender. No organomegaly or masses felt. Normal bowel sounds heard. Central nervous system: Alert and oriented. No focal neurological deficits. Extremities: Symmetric 5 x 5 power. Skin: No rashes, lesions or ulcers Psychiatry: Judgement and insight appear normal. Mood & affect appropriate.    Data Reviewed:  Lab results reviewed.  Family Communication: None  Disposition: Status is: Inpatient Remains inpatient appropriate because: Severity of disease,     Time spent: 35 minutes  Author: Marrion Coy, MD 05/06/2023 10:44 AM  For on call review [email or web address].

## 2023-05-06 NOTE — Plan of Care (Signed)
  Problem: Education: Goal: Knowledge of General Education information will improve Description: Including pain rating scale, medication(s)/side effects and non-pharmacologic comfort measures 05/06/2023 1344 by Murriel Hopper, Donald Pore, RN Outcome: Progressing 05/06/2023 1339 by Murriel Hopper, Donald Pore, RN Outcome: Progressing   Problem: Health Behavior/Discharge Planning: Goal: Ability to manage health-related needs will improve 05/06/2023 1344 by Murriel Hopper, Donald Pore, RN Outcome: Progressing 05/06/2023 1339 by Murriel Hopper, Donald Pore, RN Outcome: Progressing   Problem: Clinical Measurements: Goal: Ability to maintain clinical measurements within normal limits will improve 05/06/2023 1344 by Murriel Hopper, Donald Pore, RN Outcome: Progressing 05/06/2023 1339 by Murriel Hopper, Donald Pore, RN Outcome: Progressing Goal: Will remain free from infection 05/06/2023 1344 by Murriel Hopper, Donald Pore, RN Outcome: Progressing 05/06/2023 1339 by Murriel Hopper, Donald Pore, RN Outcome: Progressing Goal: Diagnostic test results will improve 05/06/2023 1344 by Monica Becton, RN Outcome: Progressing 05/06/2023 1339 by Murriel Hopper, Donald Pore, RN Outcome: Progressing Goal: Respiratory complications will improve 05/06/2023 1344 by Monica Becton, RN Outcome: Progressing 05/06/2023 1339 by Murriel Hopper, Donald Pore, RN Outcome: Progressing Goal: Cardiovascular complication will be avoided 05/06/2023 1344 by Monica Becton, RN Outcome: Progressing 05/06/2023 1339 by Monica Becton, RN Outcome: Progressing   Problem: Activity: Goal: Risk for activity intolerance will decrease 05/06/2023 1344 by Murriel Hopper, Donald Pore, RN Outcome: Progressing 05/06/2023 1339 by Murriel Hopper, Donald Pore, RN Outcome: Progressing   Problem: Nutrition: Goal: Adequate nutrition will be maintained 05/06/2023 1344 by Murriel Hopper, Donald Pore, RN Outcome: Progressing 05/06/2023 1339 by Murriel Hopper, Donald Pore, RN Outcome: Progressing   Problem: Coping: Goal: Level of anxiety will decrease 05/06/2023 1344 by Murriel Hopper, Donald Pore, RN Outcome: Progressing 05/06/2023 1339 by Murriel Hopper, Donald Pore, RN Outcome: Progressing   Problem: Elimination: Goal: Will not experience complications related to bowel motility 05/06/2023 1344 by Monica Becton, RN Outcome: Progressing 05/06/2023 1339 by Murriel Hopper, Donald Pore, RN Outcome: Progressing Goal: Will not experience complications related to urinary retention 05/06/2023 1344 by Monica Becton, RN Outcome: Progressing 05/06/2023 1339 by Murriel Hopper, Donald Pore, RN Outcome: Progressing   Problem: Pain Managment: Goal: General experience of comfort will improve 05/06/2023 1344 by Murriel Hopper, Donald Pore, RN Outcome: Progressing 05/06/2023 1339 by Murriel Hopper, Donald Pore, RN Outcome: Progressing   Problem: Safety: Goal: Ability to remain free from injury will improve 05/06/2023 1344 by Murriel Hopper, Donald Pore, RN Outcome: Progressing 05/06/2023 1339 by Murriel Hopper, Donald Pore, RN Outcome: Progressing   Problem: Skin Integrity: Goal: Risk for impaired skin integrity will decrease 05/06/2023 1344 by Monica Becton, RN Outcome: Progressing 05/06/2023 1339 by Monica Becton, RN Outcome: Progressing

## 2023-05-06 NOTE — Progress Notes (Signed)
Hematology/Oncology Progress note [phone] [phone]     Patient Care Team: [name], MD as PCP - General (Family Medicine) [name], RN as Oncology Nurse Navigator Rickard Patience, MD as Consulting Physician (Oncology)   Name of the patient: Jonathan [name]  [id number]  1956/01/21  Date of visit: 05/06/23   INTERVAL HISTORY-   05/05/2023 EGD showed - Normal duodenal bulb, first portion of the duodenum and second portion of the duodenum. - Inflamed mucosa in the cardia. - Severe chemotherapy/ radiation esophagitis with bleeding. Hemostatic spray applied.  Eliquis has been on hold since admission.  Vascular surgeon was consulted and no role for IVC filter was recommended as DVT was in the upper arms of IV port.    No Known Allergies  Patient Active Problem List   Diagnosis Date Noted   Adenocarcinoma of esophagus (HCC) 08/28/2022    Priority: High   Pneumonitis 03/27/2023    Priority: Medium    IDA (iron deficiency anemia) 03/27/2023    Priority: Medium    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/13/2023    Priority: Medium    Deep venous thrombosis (HCC) 10/28/2022    Priority: Medium    Encounter for antineoplastic chemotherapy 10/21/2022    Priority: Medium    Hypokalemia 08/28/2022    Priority: Medium    Postnasal drip 02/26/2023    Priority: Low   Sinus congestion 02/12/2023    Priority: Low   Skin rash 01/13/2023    Priority: Low   Chemotherapy induced neutropenia (HCC) 11/25/2022    Priority: Low   Drug-induced neutropenia (HCC) 11/18/2022    Priority: Low   Weight loss 10/21/2022    Priority: Low   Dysphagia 10/21/2022    Priority: Low   Goals of care, counseling/discussion 08/28/2022    Priority: Low   History of esophageal cancer 05/05/2023   Acute blood loss anemia 05/04/2023   Upper GI bleed 05/02/2023   Normocytic anemia 05/02/2023   Prediabetes 03/12/2022   Elevated ALT measurement 08/08/2017   Fatty  infiltration of liver 09/20/2014   Obesity (BMI 30-39.9) 09/20/2014     Past Medical History:  Diagnosis Date   Arthritis    Complication of anesthesia    OCCURRED ONCE YEARS AGO 1981   Esophageal mass    Hypertension    Hypothyroidism    PONV (postoperative nausea and vomiting)      Past Surgical History:  Procedure Laterality Date   COLONOSCOPY     ESOPHAGOGASTRODUODENOSCOPY N/A 08/23/2022   Procedure: ESOPHAGOGASTRODUODENOSCOPY (EGD);  Surgeon: Jaynie Collins, DO;  Location: [facility] [place] ENDOSCOPY;  Service: Gastroenterology;  Laterality: N/A;   EUS N/A 09/12/2022   Procedure: FULL UPPER ENDOSCOPIC ULTRASOUND (EUS) RADIAL;  Surgeon: Bearl Mulberry, MD;  Location: [facility] ENDOSCOPY;  Service: Gastroenterology;  Laterality: N/A;   FINGER SURGERY     PORTA CATH INSERTION N/A 10/17/2022   Procedure: PORTA CATH INSERTION;  Surgeon: Annice Needy, MD;  Location: [facility] INVASIVE CV LAB;  Service: Cardiovascular;  Laterality: N/A;   TENDON REPAIR IN LEFT KNEE      Social History   Socioeconomic History   Marital status: Single    Spouse name: Not on file   Number of children: Not on file   Years of education: Not on file   Highest education level: Not on file  Occupational History   Not on file  Tobacco Use   Smoking status: Never   Smokeless tobacco: Never  Vaping Use   Vaping status: Never  Used  Substance and Sexual Activity   Alcohol use: Yes    Comment: OCCASIONALLY   Drug use: Never   Sexual activity: Not on file  Other Topics Concern   Not on file  Social History Narrative   Not on file   Social Determinants of Health   Financial Resource Strain: Low Risk  (10/29/2022)   Received from [facility], [facility]   Overall Financial Resource Strain (CARDIA)    Difficulty of Paying Living Expenses: Not hard at all  Food Insecurity: No Food Insecurity (05/03/2023)   Hunger Vital Sign    Worried About Running Out of Food in the  Last Year: Never true    Ran Out of Food in the Last Year: Never true  Transportation Needs: No Transportation Needs (05/03/2023)   PRAPARE - Administrator, Civil Service (Medical): No    Lack of Transportation (Non-Medical): No  Physical Activity: Not on file  Stress: Not on file  Social Connections: Not on file  Intimate Partner Violence: Not At Risk (05/03/2023)   Humiliation, Afraid, Rape, and Kick questionnaire    Fear of Current or Ex-Partner: No    Emotionally Abused: No    Physically Abused: No    Sexually Abused: No     Family History  Problem Relation Age of Onset   Heart attack Mother    Prostate cancer Father    Breast cancer Sister    Prostate cancer Paternal Uncle      Current Facility-Administered Medications:    acetaminophen (TYLENOL) tablet 975 mg, 975 mg, Oral, Q8H PRN, Marrion Coy, MD, 975 mg at 05/03/23 0810   atenolol (TENORMIN) tablet 50 mg, 50 mg, Oral, Daily, Marrion Coy, MD, 50 mg at 05/06/23 0920   cholecalciferol (VITAMIN D3) 25 MCG (1000 UNIT) tablet 5,000 Units, 5,000 Units, Oral, Daily, Marrion Coy, MD, 5,000 Units at 05/06/23 0920   levothyroxine (SYNTHROID) tablet 125 mcg, 125 mcg, Oral, q morning, Marrion Coy, MD, 125 mcg at 05/06/23 0505   lidocaine (XYLOCAINE) 2 % viscous mouth solution 15 mL, 15 mL, Mouth/Throat, Q4H PRN, Jaynie Collins, DO   melatonin tablet 5 mg, 5 mg, Oral, QHS, Jawo, Modou L, NP, 5 mg at 05/05/23 2023   pantoprazole (PROTONIX) injection 40 mg, 40 mg, Intravenous, Q12H, Marrion Coy, MD, 40 mg at 05/06/23 0921   potassium chloride SA (KLOR-CON M) CR tablet 40 mEq, 40 mEq, Oral, Q2H, Marrion Coy, MD, 40 mEq at 05/06/23 0920   Physical exam:  Vitals:   05/05/23 1648 05/05/23 1957 05/06/23 0331 05/06/23 0734  BP: 102/69 132/67 91/66 111/71  Pulse: 93 88 83 90  Resp: 18 20 16 20   Temp: 98.3 F (36.8 C) 98.8 F (37.1 C) 99.1 F (37.3 C) 97.9 F (36.6 C)  TempSrc:  Oral Oral Oral  SpO2: 96% 90%  93% 95%  Weight:      Height:       Physical Exam Constitutional:      Appearance: He is not diaphoretic.  HENT:     Head: Normocephalic and atraumatic.  Eyes:     General: No scleral icterus. Cardiovascular:     Rate and Rhythm: Normal rate and regular rhythm.  Pulmonary:     Effort: Pulmonary effort is normal. No respiratory distress.  Abdominal:     General: Bowel sounds are normal. There is no distension.     Palpations: Abdomen is soft.     Tenderness: There is no  abdominal tenderness.  Musculoskeletal:        General: Normal range of motion.     Cervical back: Normal range of motion and neck supple.  Skin:    General: Skin is warm and dry.  Neurological:     Mental Status: He is alert and oriented to person, place, and time. Mental status is at baseline.     Motor: No abnormal muscle tone.  Psychiatric:        Mood and Affect: Mood and affect normal.       Labs    Latest Ref Rng & Units 05/06/2023    4:58 AM 05/05/2023    4:18 AM 05/04/2023    3:26 AM  CBC  WBC 4.0 - 10.5 K/uL 3.7     Hemoglobin 13.0 - 17.0 g/dL 7.9  8.3  8.5   Hematocrit 39.0 - 52.0 % 26.0     Platelets 150 - 400 K/uL 154         Latest Ref Rng & Units 05/06/2023    4:58 AM 05/03/2023    4:04 AM 05/02/2023   11:41 AM  CMP  Glucose 70 - 99 mg/dL 93  85    BUN 8 - 23 mg/dL 12  19    Creatinine 2.95 - 1.24 mg/dL 2.84  1.32    Sodium 440 - 145 mmol/L 135  137    Potassium 3.5 - 5.1 mmol/L 3.4  3.7    Chloride 98 - 111 mmol/L 105  101    CO2 22 - 32 mmol/L 24  26    Calcium 8.9 - 10.3 mg/dL 8.3  8.8    Total Protein 6.5 - 8.1 g/dL  6.8  6.8   Total Bilirubin 0.3 - 1.2 mg/dL  1.0  0.8   Alkaline Phos 38 - 126 U/L  41  46   AST 15 - 41 U/L  22  22   ALT 0 - 44 U/L  32  33      RADIOGRAPHIC STUDIES: I have personally reviewed the radiological images as listed and agreed with the findings in the report. CT ABDOMEN PELVIS W CONTRAST  Result Date: 05/02/2023 CLINICAL DATA:  Esophageal  cancer, abdominal pain, vomiting, concern for esophageal erosion and GI bleeding EXAM: CT ABDOMEN AND PELVIS WITH CONTRAST TECHNIQUE: Multidetector CT imaging of the abdomen and pelvis was performed using the standard protocol following bolus administration of intravenous contrast. RADIATION DOSE REDUCTION: This exam was performed according to the departmental dose-optimization program which includes automated exposure control, adjustment of the mA and/or kV according to patient size and/or use of iterative reconstruction technique. CONTRAST:  75mL OMNIPAQUE IOHEXOL 350 MG/ML SOLN COMPARISON:  CT chest abdomen pelvis, 03/19/2023 FINDINGS: Lower chest: Please see separately reported examination of the chest. Hepatobiliary: Slightly diminished size of multiple hypodense liver lesions, largest index lesion of the posterior liver dome measuring 8.2 x 5.1 cm, previously 9.3 x 6.2 cm (series 4, image 12). Small gallstones. No gallbladder wall thickening, or biliary dilatation. Pancreas: Unremarkable. No pancreatic ductal dilatation or surrounding inflammatory changes. Spleen: Normal in size without significant abnormality. Adrenals/Urinary Tract: Diminished size of bilateral adrenal masses, on the right measuring 4.3 x 3.4 cm, previously 5.9 x 4.4 cm (series 4, image 19). Left-sided nodule is almost completely resolved and difficult to discretely measure (series 4, image 17). Simple, benign left renal cortical cysts, for which no further follow-up or characterization is required. Small nonobstructive bilateral renal calculi. No ureteral calculi or hydronephrosis. Bladder is  unremarkable. Stomach/Bowel: Stomach is within normal limits. Appendix appears normal. No evidence of bowel wall thickening, distention, or inflammatory changes. Descending and sigmoid diverticulosis. Vascular/Lymphatic: Aortic atherosclerosis. No enlarged abdominal or pelvic lymph nodes. Reproductive: No mass or other significant abnormality. Other:  Small fat containing right inguinal hernia.  No ascites. Musculoskeletal: No acute or significant osseous findings. IMPRESSION: 1. No acute CT findings of the abdomen or pelvis to explain abdominal pain or vomiting. 2. Slightly diminished size of multiple hypodense liver lesions as well as bilateral adrenal masses, consistent with treatment response of abdominal metastatic disease. 3. Cholelithiasis. 4. Nonobstructive bilateral renal calculi. 5. Descending and sigmoid diverticulosis without evidence of acute diverticulitis. Aortic Atherosclerosis (ICD10-I70.0). Electronically Signed   By: Jearld Lesch M.D.   On: 05/02/2023 14:43   CT Angio Chest PE W and/or Wo Contrast  Result Date: 05/02/2023 CLINICAL DATA:  PE suspected, vomiting, esophageal cancer * Tracking Code: BO * EXAM: CT ANGIOGRAPHY CHEST WITH CONTRAST TECHNIQUE: Multidetector CT imaging of the chest was performed using the standard protocol during bolus administration of intravenous contrast. Multiplanar CT image reconstructions and MIPs were obtained to evaluate the vascular anatomy. RADIATION DOSE REDUCTION: This exam was performed according to the departmental dose-optimization program which includes automated exposure control, adjustment of the mA and/or kV according to patient size and/or use of iterative reconstruction technique. CONTRAST:  75mL OMNIPAQUE IOHEXOL 350 MG/ML SOLN COMPARISON:  CT chest abdomen pelvis, 03/19/2023 FINDINGS: Cardiovascular: Satisfactory opacification of the pulmonary arteries to the segmental level. No evidence of pulmonary embolism. Normal heart size. Left coronary artery calcifications. No pericardial effusion. Right chest port catheter. Aortic atherosclerosis. Mediastinum/Nodes: No enlarged mediastinal, hilar, or axillary lymph nodes. Circumferential wall thickening of the lower third of the esophagus (series 2, image 97). Thyroid gland and trachea demonstrate no significant findings. Lungs/Pleura: Perihilar and  infrahilar fibrosis of the right lower lobe, unchanged in appearance compared to prior examination (series 3, image 62). Bibasilar scarring or atelectasis. No pleural effusion or pneumothorax. Upper Abdomen: Please see separately reported examination of the abdomen and pelvis. Musculoskeletal: No chest wall abnormality. No acute osseous findings. Review of the MIP images confirms the above findings. IMPRESSION: 1. Negative examination for pulmonary embolism. 2. Circumferential wall thickening of the lower third of the esophagus, consistent with known esophageal malignancy. 3. Unchanged fibrotic scarring and volume loss of the perihilar right lung and right lower lobe. 4. Coronary artery disease. Aortic Atherosclerosis (ICD10-I70.0). Electronically Signed   By: Jearld Lesch M.D.   On: 05/02/2023 14:36   DG Chest 2 View  Result Date: 05/02/2023 CLINICAL DATA:  History of esophageal cancer with chest tightness and epigastric pain EXAM: CHEST - 2 VIEW COMPARISON:  Chest radiograph dated 02/19/2023 FINDINGS: Lines/tubes: Right chest wall port tip projects over the superior cavoatrial junction. Lungs: Mildly low lung volumes. Well inflated lungs. Bilateral perihilar linear scarring. No focal consolidations. Pleura: Unchanged blunting of the right costophrenic angle. No pneumothorax. Heart/mediastinum: The heart size and mediastinal contours are within normal limits. Bones: No acute osseous abnormality. IMPRESSION: 1. No acute cardiopulmonary process. 2. Unchanged blunting of the right costophrenic angle, which may represent a small pleural effusion or pleural thickening. Electronically Signed   By: Agustin Cree M.D.   On: 05/02/2023 13:00    Assessment and plan-   # upper GI bleeding. Status post EGD.  Findings were reviewed with patient.  Continue PPI Discontinue Eliquis    # Esophageal adenocarcinoma with liver metastasis Currently on second line chemotherapy. CT scan  shows mild treatment  response. Follow-up outpatient.   # Normocytic anemia, Hemoglobin is trending down, likely due to ongoing GI blood loss. Recommend  IV Venofer treatments, hoping to stabilize his hemoglobin.  Patient agrees with the plan. Discussed with patient oncology.  He may benefit from palliative radiation.    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 Currently off prednisone due to acute GI bleeding. Respiratory symptoms are stable.  Will remain off steroids for now.   Thank you for allowing me to participate in the care of this patient.   Rickard Patience, MD, PhD Hematology Oncology 05/06/2023

## 2023-05-07 DIAGNOSIS — K922 Gastrointestinal hemorrhage, unspecified: Secondary | ICD-10-CM | POA: Diagnosis not present

## 2023-05-07 LAB — HEMOGLOBIN: Hemoglobin: 7.9 g/dL — ABNORMAL LOW (ref 13.0–17.0)

## 2023-05-07 NOTE — Consult Note (Signed)
PHARMACY CONSULT NOTE - ELECTROLYTES  Pharmacy Consult for Electrolyte Monitoring and Replacement   Recent Labs: Potassium (mmol/L)  Date Value  05/06/2023 3.4 (L)   Magnesium (mg/dL)  Date Value  81/19/1478 2.3   Calcium (mg/dL)  Date Value  29/56/2130 8.3 (L)   Albumin (g/dL)  Date Value  86/57/8469 3.3 (L)   Sodium (mmol/L)  Date Value  05/06/2023 135   Height: 5' 8.5" (174 cm) Weight: 97.1 kg (214 lb) IBW/kg (Calculated) : 69.55 Estimated Creatinine Clearance: 83.7 mL/min (by C-G formula based on SCr of 0.99 mg/dL).  Assessment  Jonathan [name] is a 67 y.o. male presenting with upper GI bleeding. 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 Pharmacy has been consulted to monitor and replace electrolytes.  Diet: Full liquid MIVF: N/A Pertinent medications: N/A  Goal of Therapy: Electrolytes within normal limits  Plan:  No labs for today. Will check a BMP and phosphorous tomorrow  Thank you for allowing pharmacy to be a part of this patient's care.  Tressie Ellis 05/07/2023 9:17 AM

## 2023-05-07 NOTE — Progress Notes (Signed)
Progress Note   Patient: Jonathan [name] MWU:[id number] DOB: 11/25/1955 DOA: 05/02/2023     5 DOS: the patient was seen and examined on 05/07/2023   Brief hospital course: 67yo with medical history significant of esophageal cancer, hypothyroidism, essential hypertension, and history of DVT on Eliquis who presented on 10/4 with coffee-ground emesis. EGD 10/7 showed bleeding from esophageal cancer, esophagitis due to chemo and radiation.  Applied Hemospray.   Hemoglobin still trending down.   Assessment and Plan:  Upper GI Bleeding associated with esophageal CA Patient with known esophageal cancer, presented with melena and hematemesis, suggestive of upper GI bleeding. GI consulting Underwent EGD with bleeding from esophageal cancer and esophagitis from chemo/rads He is recommended to continue BID PPI He also needs to stop Eliquis and was recommended for IVC filter placement (see below) Avoid NSAIDs and SQ heparin Continue to monitor hemoglobin, avoid excessive blood draws. He is getting IV iron today Cancer is showing mild treatment response on second-line chemotherapy Palliative radiation is a consideration - I have reached out to Dr. Cathie Hoops since there is no rad onc note at this time  ABLA Hgb 10 on 9/27 and downtrending since Currently 7.9 All likely due to oozing from esophageal cancer and esophagitis, as above Monitor closely and follow cbc q12h, transfuse as necessary for Hbg <7  DVT with IV port thrombosis Seen by vascular surgery, no role for IVC filter due to the fact that DVT was in the upper arms of IV port Patient would not be able to start any anticoagulation   Essential hypertension Marginal BPs - likely associated with ABLA Hold amlodipine, atenolol, HCTZ   Hypothyroidism Continue Synthroid  Obesity Body mass index is 32.07 kg/m.Marland Kitchen  Weight loss should be encouraged Outpatient PCP/bariatric medicine f/u encouraged      Consultants: GI Vascular  surgery Oncology TOC team  Procedures: EGD 10/7  Antibiotics: None  30 Day Unplanned Readmission Risk Score    Flowsheet Row ED to Hosp-Admission (Current) from 05/02/2023 in [facility]  30 Day Unplanned Readmission Risk Score (%) 20.09 Filed at 05/07/2023 0801       This score is the patient's risk of an unplanned readmission within 30 days of being discharged (0 -100%). The score is based on dignosis, age, lab data, medications, orders, and past utilization.   Low:  0-14.9   Medium: 15-21.9   High: 22-29.9   Extreme: 30 and above           Subjective: Feels tired, worried about why his BPs are marginal.  Melena is improving, still having runny stools.   Objective: Vitals:   05/07/23 0732 05/07/23 0943  BP: (!) 87/60 (!) 88/60  Pulse: 78 79  Resp: 18   Temp: 97.6 F (36.4 C)   SpO2: 94%     Intake/Output Summary (Last 24 hours) at 05/07/2023 1531 Last data filed at 05/06/2023 1656 Gross per 24 hour  Intake 0.5 ml  Output --  Net 0.5 ml   Filed Weights   05/02/23 1133  Weight: 97.1 kg    Exam:  General:  Appears calm and comfortable and is in NAD, pale Eyes:  EOMI, normal lids, iris ENT:  grossly normal hearing, lips & tongue, mmm Neck:  no LAD, masses or thyromegaly Cardiovascular:  RRR, no m/r/g. No LE edema.  Respiratory:   CTA bilaterally with no wheezes/rales/rhonchi.  Normal respiratory effort. Abdomen:  soft, NT, ND Skin:  no rash or induration seen on limited exam  Musculoskeletal:  grossly normal tone BUE/BLE, good ROM, no bony abnormality Psychiatric:  grossly normal mood and affect, speech fluent and appropriate, AOx3 Neurologic:  CN 2-12 grossly intact, moves all extremities in coordinated fashion  Data Reviewed: I have reviewed the patient's lab results since admission.  Pertinent labs for today include:   K+ 3.4 WBC 3.7 Hgb 7.9, 9.3 on presentation     Family Communication: None present; I  called to speak with his sister by telephone  Disposition: Status is: Inpatient Remains inpatient appropriate because: ongoing management of anemia     Time spent: 50 minutes  Unresulted Labs (From admission, onward)     Start     Ordered   05/08/23 0500  Basic metabolic panel  Tomorrow morning,   R        05/07/23 0916   05/08/23 0500  Phosphorus  Tomorrow morning,   R        05/07/23 0922   05/08/23 0500  CBC with Differential/Platelet  Tomorrow morning,   R        05/07/23 1531             Author: Jonah Blue, MD 05/07/2023 3:31 PM  For on call review [email or web address].

## 2023-05-08 ENCOUNTER — Other Ambulatory Visit: Payer: Self-pay | Admitting: Oncology

## 2023-05-08 ENCOUNTER — Other Ambulatory Visit: Payer: Self-pay

## 2023-05-08 ENCOUNTER — Encounter: Payer: Self-pay | Admitting: Oncology

## 2023-05-08 DIAGNOSIS — C159 Malignant neoplasm of esophagus, unspecified: Secondary | ICD-10-CM

## 2023-05-08 DIAGNOSIS — K922 Gastrointestinal hemorrhage, unspecified: Secondary | ICD-10-CM | POA: Diagnosis not present

## 2023-05-08 LAB — CBC WITH DIFFERENTIAL/PLATELET
Abs Immature Granulocytes: 0.01 10*3/uL (ref 0.00–0.07)
Basophils Absolute: 0 10*3/uL (ref 0.0–0.1)
Basophils Relative: 1 %
Eosinophils Absolute: 0.1 10*3/uL (ref 0.0–0.5)
Eosinophils Relative: 2 %
HCT: 27.8 % — ABNORMAL LOW (ref 39.0–52.0)
Hemoglobin: 8.3 g/dL — ABNORMAL LOW (ref 13.0–17.0)
Immature Granulocytes: 0 %
Lymphocytes Relative: 21 %
Lymphs Abs: 0.8 10*3/uL (ref 0.7–4.0)
MCH: 26.5 pg (ref 26.0–34.0)
MCHC: 29.9 g/dL — ABNORMAL LOW (ref 30.0–36.0)
MCV: 88.8 fL (ref 80.0–100.0)
Monocytes Absolute: 0.4 10*3/uL (ref 0.1–1.0)
Monocytes Relative: 10 %
Neutro Abs: 2.6 10*3/uL (ref 1.7–7.7)
Neutrophils Relative %: 66 %
Platelets: 190 10*3/uL (ref 150–400)
RBC: 3.13 MIL/uL — ABNORMAL LOW (ref 4.22–5.81)
RDW: 20.8 % — ABNORMAL HIGH (ref 11.5–15.5)
WBC: 3.9 10*3/uL — ABNORMAL LOW (ref 4.0–10.5)
nRBC: 0 % (ref 0.0–0.2)

## 2023-05-08 LAB — BASIC METABOLIC PANEL
Anion gap: 10 (ref 5–15)
BUN: 8 mg/dL (ref 8–23)
CO2: 22 mmol/L (ref 22–32)
Calcium: 8 mg/dL — ABNORMAL LOW (ref 8.9–10.3)
Chloride: 105 mmol/L (ref 98–111)
Creatinine, Ser: 0.93 mg/dL (ref 0.61–1.24)
GFR, Estimated: >60 mL/min (ref >60)
Glucose, Bld: 86 mg/dL (ref 70–99)
Potassium: 3.7 mmol/L (ref 3.5–5.1)
Sodium: 137 mmol/L (ref 135–145)

## 2023-05-08 LAB — PHOSPHORUS: Phosphorus: 3.4 mg/dL (ref 2.5–4.6)

## 2023-05-08 MED ORDER — PANTOPRAZOLE SODIUM 40 MG PO TBEC
40.0000 mg | DELAYED_RELEASE_TABLET | Freq: Two times a day (BID) | ORAL | 1 refills | Status: DC
  Filled 2023-05-08: qty 60, 30d supply, fill #0

## 2023-05-08 MED ORDER — POTASSIUM CHLORIDE CRYS ER 20 MEQ PO TBCR
40.0000 meq | EXTENDED_RELEASE_TABLET | Freq: Every day | ORAL | 0 refills | Status: DC
  Filled 2023-05-08: qty 30, 15d supply, fill #0

## 2023-05-08 NOTE — Discharge Instructions (Signed)
Call 911 or return to ER for life threatening issues or other concerns.

## 2023-05-08 NOTE — TOC Transition Note (Signed)
Transition of Care [facility]) - CM/SW Discharge Note   Patient Details  Name: Jonathan [name] MRN: [id number] Date of Birth: [birth year]  [facility] CM/SW Contact:  Margarito Liner, LCSW Phone Number: 05/08/2023, 2:34 PM   Clinical Narrative:   Patient has orders to discharge home today. No further concerns. CSW signing off.  Final next level of care: Home/Self Care Barriers to Discharge: Barriers Resolved   Patient Goals and CMS Choice      Discharge Placement                  Patient to be transferred to facility by: Brother-in-law   Patient and family notified of of transfer: 05/08/23  Discharge Plan and Services Additional resources added to the After Visit Summary for       Post Acute Care Choice: NA                               Social Determinants of Health (SDOH) Interventions SDOH Screenings   Food Insecurity: No Food Insecurity (05/03/2023)  Housing: Low Risk  (05/03/2023)  Transportation Needs: No Transportation Needs (05/03/2023)  Utilities: Not At Risk (05/03/2023)  Depression (PHQ2-9): Low Risk  (08/28/2022)  Financial Resource Strain: Low Risk  (10/29/2022)   Received from [facility], [facility]  Tobacco Use: Low Risk  (05/02/2023)     Readmission Risk Interventions     No data to display

## 2023-05-08 NOTE — Progress Notes (Signed)
Nsg Discharge Note  Admit Date:  05/02/2023 Discharge date: 05/08/2023   [name] to be D/C'd Home per MD order.  AVS completed.  Copy for chart, and copy for patient signed, and dated. Patient/caregiver able to verbalize understanding.  Discharge Medication: Allergies as of 05/08/2023   No Known Allergies      Medication List     STOP taking these medications    amLODipine 10 MG tablet Commonly known as: NORVASC   apixaban 2.5 MG Tabs tablet Commonly known as: Eliquis   atenolol 50 MG tablet Commonly known as: TENORMIN   cetirizine 10 MG tablet Commonly known as: ZyrTEC Allergy   hydrochlorothiazide 25 MG tablet Commonly known as: HYDRODIURIL   hydrocortisone-nystatin-lidocaine in diphenhydrAMINE liquid   omeprazole 20 MG capsule Commonly known as: PRILOSEC   predniSONE 10 MG tablet Commonly known as: DELTASONE   predniSONE 20 MG tablet Commonly known as: DELTASONE       TAKE these medications    acetaminophen 650 MG CR tablet Commonly known as: TYLENOL Take 1,300 mg by mouth every 8 (eight) hours as needed for pain.   azelastine 0.1 % nasal spray Commonly known as: ASTELIN Place 1 spray into both nostrils 2 (two) times daily. Use in each nostril as directed   benzonatate 200 MG capsule Commonly known as: TESSALON Take 1 capsule (200 mg total) by mouth 3 (three) times daily as needed for cough.   chlorhexidine 0.12 % solution Commonly known as: PERIDEX USE AS DIRECTED TAKE  15  ML  IN  THE  MOUTH  OR  THROAT  TWICE  DAILY   Iron-Vitamin C 65-125 MG Tabs Take 1 tablet by mouth daily.   levothyroxine 125 MCG tablet Commonly known as: SYNTHROID Take 125 mcg by mouth every morning.   multivitamin with minerals tablet Take 1 tablet by mouth daily.   ondansetron 8 MG tablet Commonly known as: Zofran Take 1 tablet (8 mg total) by mouth every 8 (eight) hours as needed for nausea or vomiting. Start on the third day after chemotherapy.    pantoprazole 40 MG tablet Commonly known as: Protonix Take 1 tablet (40 mg total) by mouth 2 (two) times daily.   potassium chloride SA 20 MEQ tablet Commonly known as: KLOR-CON M Take 2 tablets (40 mEq total) by mouth daily. What changed: when to take this   prochlorperazine 10 MG tablet Commonly known as: COMPAZINE Take 1 tablet (10 mg total) by mouth every 6 (six) hours as needed for nausea or vomiting.   Vitamin D3 125 MCG (5000 UT) Caps Take 5,000 Units by mouth daily.        Discharge Assessment: Vitals:   05/08/23 0855 05/08/23 1442  BP: 104/67 113/67  Pulse: 93 98  Resp: 16 20  Temp: 98.4 F (36.9 C) 98.2 F (36.8 C)  SpO2: 98% 94%   Skin clean, dry and intact without evidence of skin break down, no evidence of skin tears noted. IV catheter discontinued intact. Site without signs and symptoms of complications - no redness or edema noted at insertion site, patient denies c/o pain - only slight tenderness at site.  Dressing with slight pressure applied.  D/c Instructions-Education: Discharge instructions given to patient/family with verbalized understanding. D/c education completed with patient/family including follow up instructions, medication list, d/c activities limitations if indicated, with other d/c instructions as indicated by MD - patient able to verbalize understanding, all questions fully answered. Patient instructed to return to ED, call 911, or call  MD for any changes in condition.  Patient escorted via WC, and D/C home via private auto.  Adair Laundry, RN 05/08/2023 3:01 PM

## 2023-05-08 NOTE — Consult Note (Signed)
[facility]  [facility] Inpatient Consult   05/08/2023  Jonathan [name] [birth year] [id number]  [facility] [[facility]]  Accountable Care Organization [ACO] Patient: [facility]  Primary Care Provider: [name], MD With [facility] Liaison screened the patient remotely at [facility]. Spoke with patient via phone regarding post hospital follow up needs.  He was accepting and appreciative for follow up call.   The patient was screened for 6 day LOS with noted medium high rising risk score for unplanned readmission risk.  The patient was assessed for potential [facility]) Care Management service needs for post hospital transition for care coordination. Patient with new medical follow up needs and medication regimen for post hospital support.  Patient lives alone.  Plan: [facility] Liaison will continue to follow progress and disposition to asess for post hospital community care coordination/management needs.  Referral request for community care coordination: plan for follow up support   Community Care Management/Population Health does not replace or interfere with any arrangements made by the Inpatient Transition of Care team.   For questions contact:   Charlesetta Shanks, RN, BSN, CCM [place]  [facility] Liaison Direct Dial: [phone] or secure chat Website: [email or web address]

## 2023-05-08 NOTE — Consult Note (Signed)
PHARMACY CONSULT NOTE - ELECTROLYTES  Pharmacy Consult for Electrolyte Monitoring and Replacement   Recent Labs: Potassium (mmol/L)  Date Value  05/08/2023 3.7   Magnesium (mg/dL)  Date Value  59/56/3875 2.3   Calcium (mg/dL)  Date Value  64/33/2951 8.0 (L)   Albumin (g/dL)  Date Value  88/41/6606 3.3 (L)   Phosphorus (mg/dL)  Date Value  30/16/0109 3.4   Sodium (mmol/L)  Date Value  05/08/2023 137   Height: 5' 8.5" (174 cm) Weight: 97.1 kg (214 lb) IBW/kg (Calculated) : 69.55 Estimated Creatinine Clearance: 89.1 mL/min (by C-G formula based on SCr of 0.93 mg/dL).  Assessment  Jonathan [name] is a 67 y.o. male presenting with upper GI bleeding. 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 Pharmacy has been consulted to monitor and replace electrolytes.  Diet: Full liquid MIVF: N/A Pertinent medications: N/A  Goal of Therapy: Electrolytes within normal limits  Plan:  No electrolyte replacement indicated at this time Defer ordering of labs for tomorrow to primary provider  Thank you for allowing pharmacy to be a part of this patient's care.  Tressie Ellis 05/08/2023 7:15 AM

## 2023-05-08 NOTE — Discharge Summary (Signed)
Physician Discharge Summary   Patient: Jonathan [name] MRN: [id number] DOB: [birth year]  Admit date:     05/02/2023  Discharge date: 05/08/23  Discharge Physician: Jonah Blue   PCP: [name], MD   Recommendations at discharge:   Follow up with oncology tomorrow morning as scheduled Follow up with Radiation Oncology for possible outpatient palliative radiation Follow up with Dr. Burnett Sheng in 1-2 weeks Continue to hold all blood pressure medications for now (amlodipine, atenolol, hydrochlorothiazide) Stop taking Eliquis Take Protonix twice daily (instead of once daily Prilosec) Change Klor-Con to 40 mEq once daily (1 pill once a day)  Discharge Diagnoses: Principal Problem:   Upper GI bleed Active Problems:   Adenocarcinoma of esophagus (HCC)   Obesity (BMI 30-39.9)   Deep venous thrombosis (HCC)   Acute blood loss anemia   History of esophageal cancer   Hospital Course: [phone] with medical history significant of esophageal cancer, hypothyroidism, essential hypertension, and history of DVT on Eliquis who presented on 10/4 with coffee-ground emesis. EGD 10/7 showed bleeding from esophageal cancer, esophagitis due to chemo and radiation.  Applied Hemospray.   Hemoglobin still trending down.   Assessment and Plan:  Upper GI Bleeding associated with esophageal CA Patient with known esophageal cancer, presented with melena and hematemesis, suggestive of upper GI bleeding. GI consulted Underwent EGD with bleeding from esophageal cancer and esophagitis from chemo/rads He is recommended to continue BID PPI He also needs to stop Eliquis and was recommended for IVC filter placement (see below) Avoid NSAIDs and SQ heparin Appears to be stable at this time, feeling well, wants to go home He received IV iron x 3 doses Cancer is showing mild treatment response on second-line chemotherapy Palliative radiation is a consideration - outpatient f/u is appropriate He has an appointment  with Dr. Cathie Hoops for chemotherapy at the cancer center tomorrow AM   ABLA Hgb 10 on 9/27 and downtrending since Appears to have stabilized at 8.3 All likely due to oozing from esophageal cancer and esophagitis, as above He is at high risk for recurrent bleeding, will need to transfuse as necessary for Hbg <7   DVT with IV port thrombosis Seen by vascular surgery, no role for IVC filter due to the fact that DVT was in the upper arms of IV port Patient would not be able to start any anticoagulation   Essential hypertension Marginal BPs - likely associated with ABLA Continue to hold amlodipine, atenolol, hydrochlorothiazide for now   Hypothyroidism Continue Synthroid   Obesity Body mass index is 32.07 kg/m.Marland Kitchen  Weight loss should be encouraged Outpatient PCP/bariatric medicine f/u encouraged         Consultants: GI Vascular surgery Oncology TOC team   Procedures: EGD 10/7   Antibiotics: None   30 Day Unplanned Readmission Risk Score    Flowsheet Row ED to Hosp-Admission (Current) from 05/02/2023 in [facility]  30 Day Unplanned Readmission Risk Score (%) 21.81 Filed at 05/08/2023 1200       This score is the patient's risk of an unplanned readmission within 30 days of being discharged (0 -100%). The score is based on dignosis, age, lab data, medications, orders, and past utilization.   Low:  0-14.9   Medium: 15-21.9   High: 22-29.9   Extreme: 30 and above          Pain control - [place] Controlled Substance Reporting System database was reviewed. and patient was instructed, not to drive, operate heavy machinery, perform activities  at heights, swimming or participation in water activities or provide baby-sitting services while on Pain, Sleep and Anxiety Medications; until their outpatient Physician has advised to do so again. Also recommended to not to take more than prescribed Pain, Sleep and Anxiety Medications.   Disposition:  Home Diet recommendation:  Full liquid diet - advance as tolerated to soft and then to regular diet DISCHARGE MEDICATION: Allergies as of 05/08/2023   No Known Allergies      Medication List     STOP taking these medications    amLODipine 10 MG tablet Commonly known as: NORVASC   apixaban 2.5 MG Tabs tablet Commonly known as: Eliquis   atenolol 50 MG tablet Commonly known as: TENORMIN   cetirizine 10 MG tablet Commonly known as: ZyrTEC Allergy   hydrochlorothiazide 25 MG tablet Commonly known as: HYDRODIURIL   hydrocortisone-nystatin-lidocaine in diphenhydrAMINE liquid   omeprazole 20 MG capsule Commonly known as: PRILOSEC   predniSONE 10 MG tablet Commonly known as: DELTASONE   predniSONE 20 MG tablet Commonly known as: DELTASONE       TAKE these medications    acetaminophen 650 MG CR tablet Commonly known as: TYLENOL Take 1,300 mg by mouth every 8 (eight) hours as needed for pain.   azelastine 0.1 % nasal spray Commonly known as: ASTELIN Place 1 spray into both nostrils 2 (two) times daily. Use in each nostril as directed   benzonatate 200 MG capsule Commonly known as: TESSALON Take 1 capsule (200 mg total) by mouth 3 (three) times daily as needed for cough.   chlorhexidine 0.12 % solution Commonly known as: PERIDEX USE AS DIRECTED TAKE  15  ML  IN  THE  MOUTH  OR  THROAT  TWICE  DAILY   Iron-Vitamin C 65-125 MG Tabs Take 1 tablet by mouth daily.   levothyroxine 125 MCG tablet Commonly known as: SYNTHROID Take 125 mcg by mouth every morning.   multivitamin with minerals tablet Take 1 tablet by mouth daily.   ondansetron 8 MG tablet Commonly known as: Zofran Take 1 tablet (8 mg total) by mouth every 8 (eight) hours as needed for nausea or vomiting. Start on the third day after chemotherapy.   pantoprazole 40 MG tablet Commonly known as: Protonix Take 1 tablet (40 mg total) by mouth 2 (two) times daily.   potassium chloride SA 20 MEQ  tablet Commonly known as: KLOR-CON M Take 2 tablets (40 mEq total) by mouth daily. What changed: when to take this   prochlorperazine 10 MG tablet Commonly known as: COMPAZINE Take 1 tablet (10 mg total) by mouth every 6 (six) hours as needed for nausea or vomiting.   Vitamin D3 125 MCG (5000 UT) Caps Take 5,000 Units by mouth daily.        Discharge Exam:    Subjective: He feels good today, no issues, no apparent bleeding, Hgb stable, wants to go home   Objective: Vitals:   05/08/23 0442 05/08/23 0855  BP: 94/65 104/67  Pulse: 84 93  Resp: 16 16  Temp: 98.6 F (37 C) 98.4 F (36.9 C)  SpO2: 94% 98%   No intake or output data in the 24 hours ending 05/08/23 1403 Filed Weights   05/02/23 1133  Weight: 97.1 kg    Exam:  General:  Appears calm and comfortable and is in NAD Eyes:  EOMI, normal lids, iris ENT:  grossly normal hearing, lips & tongue, mmm Neck:  no LAD, masses or thyromegaly Cardiovascular:  RRR, no m/r/g.  No LE edema.  Respiratory:   CTA bilaterally with no wheezes/rales/rhonchi.  Normal respiratory effort. Abdomen:  soft, NT, ND Skin:  no rash or induration seen on limited exam Musculoskeletal:  grossly normal tone BUE/BLE, good ROM, no bony abnormality Psychiatric:  grossly normal mood and affect, speech fluent and appropriate, AOx3 Neurologic:  CN 2-12 grossly intact, moves all extremities in coordinated fashion  Data Reviewed: I have reviewed the patient's lab results since admission.  Pertinent labs for today include:  Unremarkable BMP WBC 3.9 Hgb 8.3     Condition at discharge: improving  The results of significant diagnostics from this hospitalization (including imaging, microbiology, ancillary and laboratory) are listed below for reference.   Imaging Studies: CT ABDOMEN PELVIS W CONTRAST  Result Date: 05/02/2023 CLINICAL DATA:  Esophageal cancer, abdominal pain, vomiting, concern for esophageal erosion and GI bleeding EXAM: CT  ABDOMEN AND PELVIS WITH CONTRAST TECHNIQUE: Multidetector CT imaging of the abdomen and pelvis was performed using the standard protocol following bolus administration of intravenous contrast. RADIATION DOSE REDUCTION: This exam was performed according to the departmental dose-optimization program which includes automated exposure control, adjustment of the mA and/or kV according to patient size and/or use of iterative reconstruction technique. CONTRAST:  75mL OMNIPAQUE IOHEXOL 350 MG/ML SOLN COMPARISON:  CT chest abdomen pelvis, 03/19/2023 FINDINGS: Lower chest: Please see separately reported examination of the chest. Hepatobiliary: Slightly diminished size of multiple hypodense liver lesions, largest index lesion of the posterior liver dome measuring 8.2 x 5.1 cm, previously 9.3 x 6.2 cm (series 4, image 12). Small gallstones. No gallbladder wall thickening, or biliary dilatation. Pancreas: Unremarkable. No pancreatic ductal dilatation or surrounding inflammatory changes. Spleen: Normal in size without significant abnormality. Adrenals/Urinary Tract: Diminished size of bilateral adrenal masses, on the right measuring 4.3 x 3.4 cm, previously 5.9 x 4.4 cm (series 4, image 19). Left-sided nodule is almost completely resolved and difficult to discretely measure (series 4, image 17). Simple, benign left renal cortical cysts, for which no further follow-up or characterization is required. Small nonobstructive bilateral renal calculi. No ureteral calculi or hydronephrosis. Bladder is unremarkable. Stomach/Bowel: Stomach is within normal limits. Appendix appears normal. No evidence of bowel wall thickening, distention, or inflammatory changes. Descending and sigmoid diverticulosis. Vascular/Lymphatic: Aortic atherosclerosis. No enlarged abdominal or pelvic lymph nodes. Reproductive: No mass or other significant abnormality. Other: Small fat containing right inguinal hernia.  No ascites. Musculoskeletal: No acute or  significant osseous findings. IMPRESSION: 1. No acute CT findings of the abdomen or pelvis to explain abdominal pain or vomiting. 2. Slightly diminished size of multiple hypodense liver lesions as well as bilateral adrenal masses, consistent with treatment response of abdominal metastatic disease. 3. Cholelithiasis. 4. Nonobstructive bilateral renal calculi. 5. Descending and sigmoid diverticulosis without evidence of acute diverticulitis. Aortic Atherosclerosis (ICD10-I70.0). Electronically Signed   By: Jearld Lesch M.D.   On: 05/02/2023 14:43   CT Angio Chest PE W and/or Wo Contrast  Result Date: 05/02/2023 CLINICAL DATA:  PE suspected, vomiting, esophageal cancer * Tracking Code: BO * EXAM: CT ANGIOGRAPHY CHEST WITH CONTRAST TECHNIQUE: Multidetector CT imaging of the chest was performed using the standard protocol during bolus administration of intravenous contrast. Multiplanar CT image reconstructions and MIPs were obtained to evaluate the vascular anatomy. RADIATION DOSE REDUCTION: This exam was performed according to the departmental dose-optimization program which includes automated exposure control, adjustment of the mA and/or kV according to patient size and/or use of iterative reconstruction technique. CONTRAST:  75mL OMNIPAQUE IOHEXOL 350 MG/ML SOLN  COMPARISON:  CT chest abdomen pelvis, 03/19/2023 FINDINGS: Cardiovascular: Satisfactory opacification of the pulmonary arteries to the segmental level. No evidence of pulmonary embolism. Normal heart size. Left coronary artery calcifications. No pericardial effusion. Right chest port catheter. Aortic atherosclerosis. Mediastinum/Nodes: No enlarged mediastinal, hilar, or axillary lymph nodes. Circumferential wall thickening of the lower third of the esophagus (series 2, image 97). Thyroid gland and trachea demonstrate no significant findings. Lungs/Pleura: Perihilar and infrahilar fibrosis of the right lower lobe, unchanged in appearance compared to prior  examination (series 3, image 62). Bibasilar scarring or atelectasis. No pleural effusion or pneumothorax. Upper Abdomen: Please see separately reported examination of the abdomen and pelvis. Musculoskeletal: No chest wall abnormality. No acute osseous findings. Review of the MIP images confirms the above findings. IMPRESSION: 1. Negative examination for pulmonary embolism. 2. Circumferential wall thickening of the lower third of the esophagus, consistent with known esophageal malignancy. 3. Unchanged fibrotic scarring and volume loss of the perihilar right lung and right lower lobe. 4. Coronary artery disease. Aortic Atherosclerosis (ICD10-I70.0). Electronically Signed   By: Jearld Lesch M.D.   On: 05/02/2023 14:36   DG Chest 2 View  Result Date: 05/02/2023 CLINICAL DATA:  History of esophageal cancer with chest tightness and epigastric pain EXAM: CHEST - 2 VIEW COMPARISON:  Chest radiograph dated 02/19/2023 FINDINGS: Lines/tubes: Right chest wall port tip projects over the superior cavoatrial junction. Lungs: Mildly low lung volumes. Well inflated lungs. Bilateral perihilar linear scarring. No focal consolidations. Pleura: Unchanged blunting of the right costophrenic angle. No pneumothorax. Heart/mediastinum: The heart size and mediastinal contours are within normal limits. Bones: No acute osseous abnormality. IMPRESSION: 1. No acute cardiopulmonary process. 2. Unchanged blunting of the right costophrenic angle, which may represent a small pleural effusion or pleural thickening. Electronically Signed   By: Agustin Cree M.D.   On: 05/02/2023 13:00    Microbiology: Results for orders placed or performed during the hospital encounter of 09/01/20  Surgical pcr screen     Status: Abnormal   Collection Time: 09/01/20  8:58 AM   Specimen: Nasal Mucosa; Nasal Swab  Result Value Ref Range Status   MRSA, PCR NEGATIVE NEGATIVE Final   Staphylococcus aureus POSITIVE (A) NEGATIVE Final    Comment: (NOTE) The Xpert  SA Assay (FDA approved for NASAL specimens in patients 66 years of age and older), is one component of a comprehensive surveillance program. It is not intended to diagnose infection nor to guide or monitor treatment. Performed at [facility], [address] [place], [place] [address]     Labs: CBC: Recent Labs  Lab 05/02/23 1133 05/02/23 1936 05/03/23 0404 05/03/23 1620 05/04/23 0326 05/05/23 0418 05/06/23 0458 05/07/23 0456 05/08/23 0443  WBC 10.1  --  7.1  --   --   --  3.7*  --  3.9*  NEUTROABS  --   --   --   --   --   --   --   --  2.6  HGB 9.3*   < > 8.7*   < > 8.5* 8.3* 7.9* 7.9* 8.3*  HCT 29.3*  --  27.7*  --   --   --  26.0*  --  27.8*  MCV 86.4  --  86.3  --   --   --  87.8  --  88.8  PLT 246  --  209  --   --   --  154  --  190   < > = values in this  interval not displayed.   Basic Metabolic Panel: Recent Labs  Lab 05/02/23 1133 05/03/23 0404 05/06/23 0458 05/08/23 0443  NA 135 137 135 137  K 4.0 3.7 3.4* 3.7  CL 101 101 105 105  CO2 27 26 24 22   GLUCOSE 107* 85 93 86  BUN 22 19 12 8   CREATININE 0.91 0.79 0.99 0.93  CALCIUM 8.9 8.8* 8.3* 8.0*  MG  --   --  2.3  --   PHOS  --   --   --  3.4   Liver Function Tests: Recent Labs  Lab 05/02/23 1141 05/03/23 0404  AST 22 22  ALT 33 32  ALKPHOS 46 41  BILITOT 0.8 1.0  PROT 6.8 6.8  ALBUMIN 3.4* 3.3*   CBG: No results for input(s): "GLUCAP" in the last 168 hours.  Discharge time spent: greater than 30 minutes.  Signed: Jonah Blue, MD [facility] 05/08/2023

## 2023-05-08 NOTE — TOC Initial Note (Signed)
Transition of Care [facility]) - Initial/Assessment Note    Patient Details  Name: Jonathan [name] MRN: [id number] Date of Birth: 09/13/1955  Transition of Care [facility]) CM/SW Contact:    Margarito Liner, LCSW Phone Number: 05/08/2023, 11:37 AM  Clinical Narrative: Readmission prevention screen complete. Unable to enter data. CSW met with patient. No supports at bedside. CSW introduced role and explained that discharge planning would be discussed. PCP is [name], MD. Patient drives himself to appointments. [facility]acy is [facility] [address]. No issues obtaining medications. Patient lives home alone. No home health or DME use prior to admission. No further concerns. CSW encouraged patient to contact CSW as needed. CSW will continue to follow patient for support and facilitate return home once stable. His brother-in-law will pick him up at discharge.                  Expected Discharge Plan: Home/Self Care Barriers to Discharge: Continued Medical Work up   Patient Goals and CMS Choice            Expected Discharge Plan and Services     Post Acute Care Choice: NA Living arrangements for the past 2 months: Apartment                                      Prior Living Arrangements/Services Living arrangements for the past 2 months: Apartment Lives with:: Self Patient language and need for interpreter reviewed:: Yes Do you feel safe going back to the place where you live?: Yes      Need for Family Participation in Patient Care: Yes (Comment)     Criminal Activity/Legal Involvement Pertinent to Current Situation/Hospitalization: No - Comment as needed  Activities of Daily Living   ADL Screening (condition at time of admission) Independently performs ADLs?: Yes (appropriate for developmental age) Is the patient deaf or have difficulty hearing?: No Does the patient have difficulty seeing, even when wearing glasses/contacts?: Yes Does the patient have difficulty  concentrating, remembering, or making decisions?: No  Permission Sought/Granted                  Emotional Assessment Appearance:: Appears stated age Attitude/Demeanor/Rapport: Engaged, Gracious Affect (typically observed): Accepting, Appropriate, Calm, Pleasant Orientation: : Oriented to Self, Oriented to Place, Oriented to  Time, Oriented to Situation Alcohol / Substance Use: Not Applicable Psych Involvement: No (comment)  Admission diagnosis:  Acute upper GI bleeding [K92.2] Upper GI bleed [K92.2] History of esophageal cancer [Z85.01] Patient Active Problem List   Diagnosis Date Noted   History of esophageal cancer 05/05/2023   Acute blood loss anemia 05/04/2023   Upper GI bleed 05/02/2023   Normocytic anemia 05/02/2023   Pneumonitis 03/27/2023   IDA (iron deficiency anemia) 03/27/2023   Postnasal drip 02/26/2023   Sinus congestion 02/12/2023   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/13/2023   Skin rash 01/13/2023   Chemotherapy induced neutropenia (HCC) 11/25/2022   Drug-induced neutropenia (HCC) 11/18/2022   Deep venous thrombosis (HCC) 10/28/2022   Encounter for antineoplastic chemotherapy 10/21/2022   Weight loss 10/21/2022   Dysphagia 10/21/2022   Adenocarcinoma of esophagus (HCC) 08/28/2022   Goals of care, counseling/discussion 08/28/2022   Hypokalemia 08/28/2022   Prediabetes 03/12/2022   Elevated ALT measurement 08/08/2017   Fatty infiltration of liver 09/20/2014   Obesity (BMI 30-39.9) 09/20/2014   PCP:  [name], MD Pharmacy:   [facility] [address],  [place] - [address] [place] [address] Phone: [phone] Fax: [phone]  [facility] [address] [place] [address] Phone: [phone] Fax: [phone]     Social Determinants of Health (SDOH) Social History: SDOH Screenings   Food Insecurity: No Food Insecurity (05/03/2023)  Housing: Low Risk   (05/03/2023)  Transportation Needs: No Transportation Needs (05/03/2023)  Utilities: Not At Risk (05/03/2023)  Depression (PHQ2-9): Low Risk  (08/28/2022)  Financial Resource Strain: Low Risk  (10/29/2022)   Received from [facility]  Tobacco Use: Low Risk  (05/02/2023)   SDOH Interventions:     Readmission Risk Interventions     No data to display

## 2023-05-09 ENCOUNTER — Ambulatory Visit: Payer: No Typology Code available for payment source

## 2023-05-09 ENCOUNTER — Telehealth: Payer: Self-pay | Admitting: *Deleted

## 2023-05-09 ENCOUNTER — Inpatient Hospital Stay: Payer: No Typology Code available for payment source

## 2023-05-09 ENCOUNTER — Ambulatory Visit: Payer: No Typology Code available for payment source | Admitting: Oncology

## 2023-05-09 ENCOUNTER — Other Ambulatory Visit: Payer: Self-pay

## 2023-05-09 NOTE — Progress Notes (Signed)
Care Coordination   Note   05/09/2023 Name: REGINALD MCCALEB MRN: [id number] DOB: 1956/06/05  KUBA ANDOLINA is a 67 y.o. year old male who sees [name], MD for primary care. I reached out to Katharine Look by phone today to offer care coordination services.  Mr. Siegenthaler was given information about Care Coordination services today including:   The Care Coordination services include support from the care team which includes your Nurse Coordinator, Clinical Social Worker, or Pharmacist.  The Care Coordination team is here to help remove barriers to the health concerns and goals most important to you. Care Coordination services are voluntary, and the patient may decline or stop services at any time by request to their care team member.   Care Coordination Consent Status: Patient agreed to services and verbal consent obtained.   Follow up plan:  Telephone appointment with care coordination team member scheduled for:  05/30/2023 per pt request   Encounter Outcome:  Patient Scheduled from referral   Burman Nieves, [facility] Care Coordination Care Guide Direct Dial: [phone]

## 2023-05-12 ENCOUNTER — Encounter: Payer: Self-pay | Admitting: Oncology

## 2023-05-12 ENCOUNTER — Other Ambulatory Visit: Payer: Self-pay

## 2023-05-12 ENCOUNTER — Inpatient Hospital Stay: Payer: No Typology Code available for payment source

## 2023-05-12 ENCOUNTER — Inpatient Hospital Stay (HOSPITAL_BASED_OUTPATIENT_CLINIC_OR_DEPARTMENT_OTHER): Payer: No Typology Code available for payment source | Admitting: Hospice and Palliative Medicine

## 2023-05-12 ENCOUNTER — Other Ambulatory Visit: Payer: Self-pay | Admitting: Oncology

## 2023-05-12 ENCOUNTER — Inpatient Hospital Stay: Payer: No Typology Code available for payment source | Attending: Oncology

## 2023-05-12 VITALS — BP 109/74 | HR 96 | Temp 98.1°F | Resp 16

## 2023-05-12 DIAGNOSIS — Z5111 Encounter for antineoplastic chemotherapy: Secondary | ICD-10-CM | POA: Insufficient documentation

## 2023-05-12 DIAGNOSIS — K8020 Calculus of gallbladder without cholecystitis without obstruction: Secondary | ICD-10-CM | POA: Diagnosis not present

## 2023-05-12 DIAGNOSIS — D500 Iron deficiency anemia secondary to blood loss (chronic): Secondary | ICD-10-CM

## 2023-05-12 DIAGNOSIS — C159 Malignant neoplasm of esophagus, unspecified: Secondary | ICD-10-CM

## 2023-05-12 DIAGNOSIS — K922 Gastrointestinal hemorrhage, unspecified: Secondary | ICD-10-CM | POA: Diagnosis not present

## 2023-05-12 DIAGNOSIS — R0602 Shortness of breath: Secondary | ICD-10-CM | POA: Diagnosis not present

## 2023-05-12 DIAGNOSIS — R059 Cough, unspecified: Secondary | ICD-10-CM | POA: Insufficient documentation

## 2023-05-12 DIAGNOSIS — E039 Hypothyroidism, unspecified: Secondary | ICD-10-CM | POA: Insufficient documentation

## 2023-05-12 DIAGNOSIS — K5730 Diverticulosis of large intestine without perforation or abscess without bleeding: Secondary | ICD-10-CM | POA: Insufficient documentation

## 2023-05-12 DIAGNOSIS — Z803 Family history of malignant neoplasm of breast: Secondary | ICD-10-CM | POA: Insufficient documentation

## 2023-05-12 DIAGNOSIS — C7951 Secondary malignant neoplasm of bone: Secondary | ICD-10-CM | POA: Insufficient documentation

## 2023-05-12 DIAGNOSIS — Z8042 Family history of malignant neoplasm of prostate: Secondary | ICD-10-CM | POA: Insufficient documentation

## 2023-05-12 DIAGNOSIS — I2510 Atherosclerotic heart disease of native coronary artery without angina pectoris: Secondary | ICD-10-CM | POA: Diagnosis not present

## 2023-05-12 DIAGNOSIS — K4090 Unilateral inguinal hernia, without obstruction or gangrene, not specified as recurrent: Secondary | ICD-10-CM | POA: Diagnosis not present

## 2023-05-12 DIAGNOSIS — Z5112 Encounter for antineoplastic immunotherapy: Secondary | ICD-10-CM | POA: Insufficient documentation

## 2023-05-12 DIAGNOSIS — G629 Polyneuropathy, unspecified: Secondary | ICD-10-CM | POA: Diagnosis not present

## 2023-05-12 DIAGNOSIS — E876 Hypokalemia: Secondary | ICD-10-CM | POA: Diagnosis not present

## 2023-05-12 DIAGNOSIS — D62 Acute posthemorrhagic anemia: Secondary | ICD-10-CM | POA: Insufficient documentation

## 2023-05-12 DIAGNOSIS — I10 Essential (primary) hypertension: Secondary | ICD-10-CM | POA: Insufficient documentation

## 2023-05-12 DIAGNOSIS — C7971 Secondary malignant neoplasm of right adrenal gland: Secondary | ICD-10-CM | POA: Insufficient documentation

## 2023-05-12 DIAGNOSIS — J8410 Pulmonary fibrosis, unspecified: Secondary | ICD-10-CM | POA: Insufficient documentation

## 2023-05-12 DIAGNOSIS — N200 Calculus of kidney: Secondary | ICD-10-CM | POA: Insufficient documentation

## 2023-05-12 DIAGNOSIS — K2091 Esophagitis, unspecified with bleeding: Secondary | ICD-10-CM | POA: Insufficient documentation

## 2023-05-12 DIAGNOSIS — Z7901 Long term (current) use of anticoagulants: Secondary | ICD-10-CM | POA: Insufficient documentation

## 2023-05-12 DIAGNOSIS — C7972 Secondary malignant neoplasm of left adrenal gland: Secondary | ICD-10-CM | POA: Diagnosis not present

## 2023-05-12 DIAGNOSIS — J984 Other disorders of lung: Secondary | ICD-10-CM | POA: Insufficient documentation

## 2023-05-12 DIAGNOSIS — I700 Atherosclerosis of aorta: Secondary | ICD-10-CM | POA: Insufficient documentation

## 2023-05-12 DIAGNOSIS — Z79899 Other long term (current) drug therapy: Secondary | ICD-10-CM | POA: Insufficient documentation

## 2023-05-12 DIAGNOSIS — C787 Secondary malignant neoplasm of liver and intrahepatic bile duct: Secondary | ICD-10-CM | POA: Insufficient documentation

## 2023-05-12 DIAGNOSIS — R0982 Postnasal drip: Secondary | ICD-10-CM | POA: Diagnosis not present

## 2023-05-12 DIAGNOSIS — C155 Malignant neoplasm of lower third of esophagus: Secondary | ICD-10-CM | POA: Insufficient documentation

## 2023-05-12 DIAGNOSIS — J3489 Other specified disorders of nose and nasal sinuses: Secondary | ICD-10-CM | POA: Diagnosis not present

## 2023-05-12 DIAGNOSIS — Z8249 Family history of ischemic heart disease and other diseases of the circulatory system: Secondary | ICD-10-CM | POA: Insufficient documentation

## 2023-05-12 DIAGNOSIS — K760 Fatty (change of) liver, not elsewhere classified: Secondary | ICD-10-CM | POA: Insufficient documentation

## 2023-05-12 DIAGNOSIS — Z86718 Personal history of other venous thrombosis and embolism: Secondary | ICD-10-CM | POA: Insufficient documentation

## 2023-05-12 LAB — CBC WITH DIFFERENTIAL (CANCER CENTER ONLY)
Abs Immature Granulocytes: 0.02 10*3/uL (ref 0.00–0.07)
Basophils Absolute: 0 10*3/uL (ref 0.0–0.1)
Basophils Relative: 1 %
Eosinophils Absolute: 0.1 10*3/uL (ref 0.0–0.5)
Eosinophils Relative: 2 %
HCT: 28.2 % — ABNORMAL LOW (ref 39.0–52.0)
Hemoglobin: 8.5 g/dL — ABNORMAL LOW (ref 13.0–17.0)
Immature Granulocytes: 0 %
Lymphocytes Relative: 11 %
Lymphs Abs: 0.6 10*3/uL — ABNORMAL LOW (ref 0.7–4.0)
MCH: 27 pg (ref 26.0–34.0)
MCHC: 30.1 g/dL (ref 30.0–36.0)
MCV: 89.5 fL (ref 80.0–100.0)
Monocytes Absolute: 0.5 10*3/uL (ref 0.1–1.0)
Monocytes Relative: 9 %
Neutro Abs: 4.5 10*3/uL (ref 1.7–7.7)
Neutrophils Relative %: 77 %
Platelet Count: 245 10*3/uL (ref 150–400)
RBC: 3.15 MIL/uL — ABNORMAL LOW (ref 4.22–5.81)
RDW: 21.1 % — ABNORMAL HIGH (ref 11.5–15.5)
WBC Count: 5.8 10*3/uL (ref 4.0–10.5)
nRBC: 0 % (ref 0.0–0.2)

## 2023-05-12 LAB — SAMPLE TO BLOOD BANK

## 2023-05-12 MED ORDER — AMOXICILLIN-POT CLAVULANATE 875-125 MG PO TABS: 1.0000 | ORAL_TABLET | Freq: Two times a day (BID) | ORAL | 0 refills | Status: DC

## 2023-05-12 MED ORDER — SODIUM CHLORIDE 0.9 % IV SOLN
200.0000 mg | Freq: Once | INTRAVENOUS | Status: AC
  Administered 2023-05-12: 200 mg via INTRAVENOUS
  Filled 2023-05-12: qty 200

## 2023-05-12 MED ORDER — SODIUM CHLORIDE 0.9 % IV SOLN
Freq: Once | INTRAVENOUS | Status: AC
  Filled 2023-05-12: qty 250

## 2023-05-12 MED ORDER — HEPARIN SOD (PORK) LOCK FLUSH 100 UNIT/ML IV SOLN
500.0000 [IU] | Freq: Once | INTRAVENOUS | Status: AC
  Administered 2023-05-12: 500 [IU] via INTRAVENOUS
  Filled 2023-05-12: qty 5

## 2023-05-12 MED ORDER — PREDNISONE 10 MG PO TABS: 10.0000 mg | ORAL_TABLET | Freq: Every day | ORAL | 0 refills | Status: DC

## 2023-05-12 NOTE — Progress Notes (Signed)
Symptom Management Clinic [facility] Telephone:([phone] [phone]  Patient Care Team: [name], MD as PCP - General (Family Medicine) [name], RN as Oncology Nurse Navigator Rickard Patience, MD as Consulting Physician (Oncology)   NAME OF PATIENT: Jonathan [name]  [id number]  May 24, 1956   DATE OF VISIT: 05/12/23  REASON FOR CONSULT: Jonathan [name] is a 67 y.o. 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   INTERVAL HISTORY: Patient was hospitalized 05/02/2023 to 05/08/2023 with upper GI bleed.  Patient underwent EGD with findings of esophagitis from chemotherapy/radiation.  Hemostatic spray was applied.Marland Kitchen  He was started on twice daily PPI.  Eliquis was discontinued.  Patient on second line chemotherapy.  Patient has history of immunotherapy related pneumonitis.  He has recently completed tapering course of prednisone and now reports recurrent cough and shortness of breath.  Also endorses chronic rhinorrhea/postnasal drip and sore throat.  He had episode of fever yesterday with Tmax of 102 but states that he has been afebrile today.  Denies muscle aches or pains.  No chills.  Denies any neurologic complaints. Denies any easy bleeding or bruising. Reports good appetite and denies weight loss. Denies chest pain. Denies any nausea, vomiting, constipation, or diarrhea. Denies urinary complaints. Patient offers no further specific complaints today.   PAST MEDICAL HISTORY: Past Medical History:  Diagnosis Date   Arthritis    Complication of anesthesia    OCCURRED ONCE YEARS AGO 1981   Esophageal mass    Hypertension    Hypothyroidism    PONV (postoperative nausea and vomiting)     PAST SURGICAL HISTORY:  Past Surgical History:  Procedure Laterality Date   COLONOSCOPY     ESOPHAGOGASTRODUODENOSCOPY N/A 08/23/2022    Procedure: ESOPHAGOGASTRODUODENOSCOPY (EGD);  Surgeon: Jaynie Collins, DO;  Location: [facility] ENDOSCOPY;  Service: Gastroenterology;  Laterality: N/A;   ESOPHAGOGASTRODUODENOSCOPY (EGD) WITH PROPOFOL N/A 05/05/2023   Procedure: ESOPHAGOGASTRODUODENOSCOPY (EGD) WITH PROPOFOL;  Surgeon: Jaynie Collins, DO;  Location: [facility] ENDOSCOPY;  Service: Gastroenterology;  Laterality: N/A;   EUS N/A 09/12/2022   Procedure: FULL UPPER ENDOSCOPIC ULTRASOUND (EUS) RADIAL;  Surgeon: Bearl Mulberry, MD;  Location: [facility] ENDOSCOPY;  Service: Gastroenterology;  Laterality: N/A;   FINGER SURGERY     HEMOSTASIS CONTROL  05/05/2023   Procedure: HEMOSTASIS CONTROL;  Surgeon: Jaynie Collins, DO;  Location: [facility] ENDOSCOPY;  Service: Gastroenterology;;   PORTA CATH INSERTION N/A 10/17/2022   Procedure: PORTA CATH INSERTION;  Surgeon: Annice Needy, MD;  Location: [facility] INVASIVE CV LAB;  Service: Cardiovascular;  Laterality: N/A;   TENDON REPAIR IN LEFT KNEE      HEMATOLOGY/ONCOLOGY HISTORY:  Oncology History  Adenocarcinoma of esophagus (HCC)  08/28/2022 Initial Diagnosis   Adenocarcinoma of esophagus   -Patient has noticed worsening of "food stuck/fullness" sensation since November 2023.  Patient had a barium swallow study which commented on marked mucosal irregularity in the distal esophagus with Broaddus base mural filling defect highly suspicious for malignancy.  Patient establish care with gastroenterology. -08/23/2022, EGD showed gastritis and partially obstructing malignant esophageal tumor in the lower third of the esophagus. Esophagus mass biopsy showed adenocarcinoma.  PD-L1 TPS 65%, HER2 negative.  [facility] NGS showed KRASG12V, CDKN2A, ARID1A, TP53, TMB 5.3, MSI stable.  Stomach biopsy showed gastric mucosa with no specific histology abnormality.  No significant intestinal metaplastic, dysplastic, granular atrophy or increased inflammation.     08/28/2022  Imaging   CT chest abdomen pelvis  with contrast showed 1. Distal esophageal primary with gastrohepatic ligament nodal metastasis. 2. 2 right-sided pulmonary nodules, the largest of which measures 5 mm and is new since 2015. Pulmonary metastasis not be excluded. 3. Anterior right lower lobe volume loss and minimal soft tissue density, favoring atelectasis or scar. Recommend attention on follow-up. 4. Hepatic steatosis 5. Cholelithiasis 6. Left nephrolithiasis 7. Coronary artery atherosclerosis. Aortic Atherosclerosis   08/28/2022 Cancer Staging   Staging form: Esophagus - Adenocarcinoma, AJCC 8th Edition - Clinical stage from 08/28/2022: Stage IVB (cT3, cN1, cM1) - Signed by Rickard Patience, MD on 10/04/2022 Stage prefix: Initial diagnosis   09/06/2022 Imaging   PET scan showed 1. Esophageal primary with gastrohepatic ligament nodal metastasis,as on CT. 2. Focus of hypermetabolism which is favored to registered to the posterior hepatic dome, in the region of subtle heterogeneity on prior diagnostic CT. Suboptimally evaluated secondary to underlying steatosis. Recommend further evaluation with pre and post contrast abdominal MRI to confirm probable metastasis. 3. Incidental findings, including: Left nephrolithiasis.Cholelithiasis. Coronary artery atherosclerosis. Aortic Atherosclerosis    09/14/2022 - 09/14/2022 Chemotherapy   Patient is on Treatment Plan : ESOPHAGUS Carboplatin + Paclitaxel Weekly X 6 Weeks with XRT     09/23/2022 Imaging   MRI abdomen with and without contrast showed 1. Mildly T2 hyperintense segment VII hepatic lesion measuring 2.7 cm with imaging characteristics compatible with metastatic disease. 2. Tiny focus of delayed enhancement in the inferior right lobe of the liver segment VI measuring 8 mm with ill-defined increased T2 signal and subtle corresponding reduced diffusivity, also suspicious for metastatic disease. 3. Partially visualized distal esophageal wall thickening compatible with the patient's known  primary neoplasm. 4. Similar size of the 11 mm gastrohepatic ligament lymph node mildly metabolic on prior PET-CT and compatible with local nodal disease involvement. 5. 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 Recommend follow up pre and post-contrast MRI/MRCP in 1 year. 6. Diffuse hepatic steatosis.   10/10/2022 Procedure   LIVER MASS; CT-GUIDED BIOPSY:  - MODERATE TO POORLY DIFFERENTIATED ADENOCARCINOMA MORPHOLOGICALLY  CONSISTENT WITH METASTASIS FROM PATIENT'S KNOWN ESOPHAGEAL  ADENOCARCINOMA.    10/17/2022 Procedure   Medi port placed by Dr. Wyn Quaker   10/21/2022 - 01/15/2023 Chemotherapy   Patient is on Treatment Plan : ESOPHAGEAL ADENOCARCINOMA FOLFOX q14d x 6 cycles     01/15/2023 Imaging   PET showed  1. Interval progression in metastatic esophageal carcinoma as evidenced by hypermetabolic lymph nodes in the neck, chest, abdomen and pelvis, an enlarging right hepatic lobe metastasis, new bilateral adrenal metastases and new osseous metastases. 2. Cholelithiasis. 3. Left renal stones. 4. Aortic atherosclerosis (ICD10-I70.0). Coronary artery calcification.    01/27/2023 - 03/14/2023 Chemotherapy   Patient is on Treatment Plan : GASTROESOPHAGEAL FOLFOX + Nivolumab q14d     03/20/2023 Imaging   CT chest abdomen pelvis w contrast showed 1. Interval progression of metastatic disease with multiple new and enlarged hypodense lesions throughout the liver. 2. Significant enlargement of bilateral adrenal metastases. 3. Subtle sclerosis of an osseous metastasis of the right femoral neck. Other previously FDG avid osseous metastases of the left femoral neck and right sixth rib not appreciated by CT. 4. No persistently enlarged lymph nodes. 5. Interval development of bandlike consolidation and fibrosis of the paramedian right lung, particularly of the right lower lobe, as well as additional scattered irregular  opacities throughout the right lung. 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 6. Similar circumferential wall thickening throughout the mid to lower esophagus, consistent with known primary esophageal adenocarcinoma. 7. Nonobstructive bilateral nephrolithiasis. Aortic Atherosclerosis   03/27/2023 -  Chemotherapy   Patient is on Treatment Plan : GASTROESOPHAGEAL Ramucirumab D1, 15 + Paclitaxel D1,8,15 q28d       ALLERGIES:  has No Known Allergies.  MEDICATIONS:  Current Outpatient Medications  Medication Sig Dispense Refill   acetaminophen (TYLENOL) 650 MG CR tablet Take 1,300 mg by mouth every 8 (eight) hours as needed for pain.     azelastine (ASTELIN) 0.1 % nasal spray Place 1 spray into both nostrils 2 (two) times daily. Use in each nostril as directed 30 mL 1   benzonatate (TESSALON) 200 MG capsule Take 1 capsule (200 mg total) by mouth 3 (three) times daily as needed for cough. 20 capsule 5   chlorhexidine (PERIDEX) 0.12 % solution USE AS DIRECTED TAKE  15  ML  IN  THE  MOUTH  OR  THROAT  TWICE  DAILY 473 mL 0   Cholecalciferol (VITAMIN D3) 125 MCG (5000 UT) CAPS Take 5,000 Units by mouth daily.     Iron-Vitamin C 65-125 MG TABS Take 1 tablet by mouth daily. 30 tablet 0   levothyroxine (SYNTHROID) 125 MCG tablet Take 125 mcg by mouth every morning.     Multiple Vitamins-Minerals (MULTIVITAMIN WITH MINERALS) tablet Take 1 tablet by mouth daily.     ondansetron (ZOFRAN) 8 MG tablet Take 1 tablet (8 mg total) by mouth every 8 (eight) hours as needed for nausea or vomiting. Start on the third day after chemotherapy.     pantoprazole (PROTONIX) 40 MG tablet Take 1 tablet (40 mg total) by mouth 2 (two) times daily. 60 tablet 1   potassium chloride SA (KLOR-CON M) 20 MEQ tablet Take 2 tablets (40 mEq total) by mouth daily. 30 tablet 0   prochlorperazine (COMPAZINE) 10 MG tablet Take 1 tablet (10 mg total) by mouth every 6 (six) hours as needed for  nausea or vomiting.     No current facility-administered medications for this visit.    VITAL SIGNS: There were no vitals taken for this visit. There were no vitals filed for this visit.  Estimated body mass index is 32.07 kg/m as calculated from the following:   Height as of 05/02/23: 5' 8.5" (1.74 m).   Weight as of 05/02/23: 214 lb (97.1 kg).  LABS: CBC:    Component Value Date/Time   WBC 5.8 05/12/2023 1111   WBC 3.9 (L) 05/08/2023 0443   HGB 8.5 (L) 05/12/2023 1111   HCT 28.2 (L) 05/12/2023 1111   PLT 245 05/12/2023 1111   MCV 89.5 05/12/2023 1111   NEUTROABS 4.5 05/12/2023 1111   LYMPHSABS 0.6 (L) 05/12/2023 1111   MONOABS 0.5 05/12/2023 1111   EOSABS 0.1 05/12/2023 1111   BASOSABS 0.0 05/12/2023 1111   Comprehensive Metabolic Panel:    Component Value Date/Time   NA 137 05/08/2023 0443   K 3.7 05/08/2023 0443   CL 105 05/08/2023 0443   CO2 22 05/08/2023 0443   BUN 8 05/08/2023 0443   CREATININE 0.93 05/08/2023 0443   CREATININE 0.87 04/25/2023 0825   GLUCOSE 86 05/08/2023 0443   CALCIUM 8.0 (L) 05/08/2023 0443   AST 22 05/03/2023 0404   AST 20 04/25/2023 0825   ALT 32 05/03/2023 0404   ALT 32 04/25/2023 0825   ALKPHOS 41 05/03/2023 0404   BILITOT 1.0  05/03/2023 0404   BILITOT 0.5 04/25/2023 0825   PROT 6.8 05/03/2023 0404   ALBUMIN 3.3 (L) 05/03/2023 0404    RADIOGRAPHIC STUDIES: CT ABDOMEN PELVIS W CONTRAST  Result Date: 05/02/2023 CLINICAL DATA:  Esophageal cancer, abdominal pain, vomiting, concern for esophageal erosion and GI bleeding EXAM: CT ABDOMEN AND PELVIS WITH CONTRAST TECHNIQUE: Multidetector CT imaging of the abdomen and pelvis was performed using the standard protocol following bolus administration of intravenous contrast. RADIATION DOSE REDUCTION: This exam was performed according to the departmental dose-optimization program which includes automated exposure control, adjustment of the mA and/or kV according to patient size and/or use of  iterative reconstruction technique. CONTRAST:  75mL OMNIPAQUE IOHEXOL 350 MG/ML SOLN COMPARISON:  CT chest abdomen pelvis, 03/19/2023 FINDINGS: Lower chest: Please see separately reported examination of the chest. Hepatobiliary: Slightly diminished size of multiple hypodense liver lesions, largest index lesion of the posterior liver dome measuring 8.2 x 5.1 cm, previously 9.3 x 6.2 cm (series 4, image 12). Small gallstones. No gallbladder wall thickening, or biliary dilatation. Pancreas: Unremarkable. No pancreatic ductal dilatation or surrounding inflammatory changes. Spleen: Normal in size without significant abnormality. Adrenals/Urinary Tract: Diminished size of bilateral adrenal masses, on the right measuring 4.3 x 3.4 cm, previously 5.9 x 4.4 cm (series 4, image 19). Left-sided nodule is almost completely resolved and difficult to discretely measure (series 4, image 17). Simple, benign left renal cortical cysts, for which no further follow-up or characterization is required. Small nonobstructive bilateral renal calculi. No ureteral calculi or hydronephrosis. Bladder is unremarkable. Stomach/Bowel: Stomach is within normal limits. Appendix appears normal. No evidence of bowel wall thickening, distention, or inflammatory changes. Descending and sigmoid diverticulosis. Vascular/Lymphatic: Aortic atherosclerosis. No enlarged abdominal or pelvic lymph nodes. Reproductive: No mass or other significant abnormality. Other: Small fat containing right inguinal hernia.  No ascites. Musculoskeletal: No acute or significant osseous findings. IMPRESSION: 1. No acute CT findings of the abdomen or pelvis to explain abdominal pain or vomiting. 2. Slightly diminished size of multiple hypodense liver lesions as well as bilateral adrenal masses, consistent with treatment response of abdominal metastatic disease. 3. Cholelithiasis. 4. Nonobstructive bilateral renal calculi. 5. Descending and sigmoid diverticulosis without evidence  of acute diverticulitis. Aortic Atherosclerosis (ICD10-I70.0). Electronically Signed   By: Jearld Lesch M.D.   On: 05/02/2023 14:43   CT Angio Chest PE W and/or Wo Contrast  Result Date: 05/02/2023 CLINICAL DATA:  PE suspected, vomiting, esophageal cancer * Tracking Code: BO * EXAM: CT ANGIOGRAPHY CHEST WITH CONTRAST TECHNIQUE: Multidetector CT imaging of the chest was performed using the standard protocol during bolus administration of intravenous contrast. Multiplanar CT image reconstructions and MIPs were obtained to evaluate the vascular anatomy. RADIATION DOSE REDUCTION: This exam was performed according to the departmental dose-optimization program which includes automated exposure control, adjustment of the mA and/or kV according to patient size and/or use of iterative reconstruction technique. CONTRAST:  75mL OMNIPAQUE IOHEXOL 350 MG/ML SOLN COMPARISON:  CT chest abdomen pelvis, 03/19/2023 FINDINGS: Cardiovascular: Satisfactory opacification of the pulmonary arteries to the segmental level. No evidence of pulmonary embolism. Normal heart size. Left coronary artery calcifications. No pericardial effusion. Right chest port catheter. Aortic atherosclerosis. Mediastinum/Nodes: No enlarged mediastinal, hilar, or axillary lymph nodes. Circumferential wall thickening of the lower third of the esophagus (series 2, image 97). Thyroid gland and trachea demonstrate no significant findings. Lungs/Pleura: Perihilar and infrahilar fibrosis of the right lower lobe, unchanged in appearance compared to prior examination (series 3, image 62). Bibasilar scarring or atelectasis. No  pleural effusion or pneumothorax. Upper Abdomen: Please see separately reported examination of the abdomen and pelvis. Musculoskeletal: No chest wall abnormality. No acute osseous findings. Review of the MIP images confirms the above findings. IMPRESSION: 1. Negative examination for pulmonary embolism. 2. Circumferential wall thickening of the  lower third of the esophagus, consistent with known esophageal malignancy. 3. Unchanged fibrotic scarring and volume loss of the perihilar right lung and right lower lobe. 4. Coronary artery disease. Aortic Atherosclerosis (ICD10-I70.0). Electronically Signed   By: Jearld Lesch M.D.   On: 05/02/2023 14:36   DG Chest 2 View  Result Date: 05/02/2023 CLINICAL DATA:  History of esophageal cancer with chest tightness and epigastric pain EXAM: CHEST - 2 VIEW COMPARISON:  Chest radiograph dated 02/19/2023 FINDINGS: Lines/tubes: Right chest wall port tip projects over the superior cavoatrial junction. Lungs: Mildly low lung volumes. Well inflated lungs. Bilateral perihilar linear scarring. No focal consolidations. Pleura: Unchanged blunting of the right costophrenic angle. No pneumothorax. Heart/mediastinum: The heart size and mediastinal contours are within normal limits. Bones: No acute osseous abnormality. IMPRESSION: 1. No acute cardiopulmonary process. 2. Unchanged blunting of the right costophrenic angle, which may represent a small pleural effusion or pleural thickening. Electronically Signed   By: Agustin Cree M.D.   On: 05/02/2023 13:00    PERFORMANCE STATUS (ECOG) : 1 - Symptomatic but completely ambulatory  Review of Systems Unless otherwise noted, a complete review of systems is negative.  Physical Exam General: NAD Cardiovascular: regular rate and rhythm Pulmonary: clear ant fields Abdomen: soft, nontender, + bowel sounds GU: no suprapubic tenderness Extremities: no edema, no joint deformities Skin: no rashes Neurological: Weakness but otherwise nonfocal  IMPRESSION/PLAN: Stage IV esophageal cancer -on second line chemotherapy.  Will benefit from goals of care conversation in the future.  Cough/shortness of breath -symptoms worsened slightly after discontinuation of prednisone.  Discussed with Dr. Cathie Hoops who recommended restarting prednisone 10 mg daily and also covering him empirically with  Augmentin.  Recent CTA negative for PE.  Patient not hypoxic and nontoxic-appearing.  I also recommended that patient obtain home COVID testing and let us know if results are positive.  Patient to follow-up later this week with Dr. Cathie Hoops  Case and plan discussed with Dr. Cathie Hoops   Patient expressed understanding and was in agreement with this plan. He also understands that He can call clinic at any time with any questions, concerns, or complaints.   Thank you for allowing me to participate in the care of this very pleasant patient.   Time Total: 15 minutes  Visit consisted of counseling and education dealing with the complex and emotionally intense issues of symptom management in the setting of serious illness.Greater than 50%  of this time was spent counseling and coordinating care related to the above assessment and plan.  Signed by: Laurette Schimke, PhD, NP-C

## 2023-05-12 NOTE — Progress Notes (Signed)
Patient complaints of sinus pressure, with fever of 102 last night and shortness breath with exertion. Dr. Cathie Hoops made aware . Patient to be assessed today by APP. Per Dr. Cathie Hoops proceed with Venofer infusion today.

## 2023-05-12 NOTE — Progress Notes (Signed)
Pt reports cough has started "back after stopping the prednisone". Pt is visibly SOB and having trouble talking after walking to infusion room. Pt reports having a fever "last night" which resolved after taking a dose of Tylenol. MD and MD team made aware via secure chat.

## 2023-05-13 DIAGNOSIS — E876 Hypokalemia: Secondary | ICD-10-CM | POA: Diagnosis not present

## 2023-05-13 DIAGNOSIS — K922 Gastrointestinal hemorrhage, unspecified: Secondary | ICD-10-CM | POA: Diagnosis not present

## 2023-05-13 DIAGNOSIS — R000 Tachycardia, unspecified: Secondary | ICD-10-CM | POA: Diagnosis not present

## 2023-05-13 DIAGNOSIS — R059 Cough, unspecified: Secondary | ICD-10-CM | POA: Diagnosis not present

## 2023-05-14 ENCOUNTER — Other Ambulatory Visit: Payer: Self-pay | Admitting: *Deleted

## 2023-05-14 ENCOUNTER — Ambulatory Visit
Admission: RE | Admit: 2023-05-14 | Discharge: 2023-05-14 | Disposition: A | Discharge status: Home / Self Care | Payer: No Typology Code available for payment source | Source: Ambulatory Visit | Attending: Radiation Oncology | Admitting: Radiation Oncology

## 2023-05-14 ENCOUNTER — Encounter: Payer: Self-pay | Admitting: Radiation Oncology

## 2023-05-14 VITALS — BP 123/83 | HR 102 | Temp 98.4°F | Resp 24 | Ht 69.0 in | Wt 210.0 lb

## 2023-05-14 DIAGNOSIS — E279 Disorder of adrenal gland, unspecified: Secondary | ICD-10-CM | POA: Insufficient documentation

## 2023-05-14 DIAGNOSIS — Z79899 Other long term (current) drug therapy: Secondary | ICD-10-CM | POA: Insufficient documentation

## 2023-05-14 DIAGNOSIS — C787 Secondary malignant neoplasm of liver and intrahepatic bile duct: Secondary | ICD-10-CM | POA: Insufficient documentation

## 2023-05-14 DIAGNOSIS — R0789 Other chest pain: Secondary | ICD-10-CM | POA: Insufficient documentation

## 2023-05-14 DIAGNOSIS — C7951 Secondary malignant neoplasm of bone: Secondary | ICD-10-CM | POA: Insufficient documentation

## 2023-05-14 DIAGNOSIS — C158 Malignant neoplasm of overlapping sites of esophagus: Secondary | ICD-10-CM | POA: Diagnosis not present

## 2023-05-14 DIAGNOSIS — C159 Malignant neoplasm of esophagus, unspecified: Secondary | ICD-10-CM

## 2023-05-14 DIAGNOSIS — C155 Malignant neoplasm of lower third of esophagus: Secondary | ICD-10-CM | POA: Insufficient documentation

## 2023-05-14 MED FILL — Dexamethasone Sodium Phosphate Inj 100 MG/10ML: INTRAMUSCULAR | Qty: 1 | Status: AC

## 2023-05-14 NOTE — Progress Notes (Signed)
Radiation Oncology Follow up Note  Name: Jonathan [name]   Date:   05/14/2023 MRN:  [id number] DOB: 18-Aug-1955    This 67 y.o. male presents to the clinic today for reconsultation regarding bleeding from distal esophageal adenocarcinoma stage IV disease.  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  He underwent palliative radiation therapy as well as FOLFOX chemotherapy.Marland Kitchen  He was on Eliquis for DVT about 2 weeks prior had an episode of vomiting bright red blood.  He underwent hemostatic spray for an area of ulceration 31 to 36 cm from the incisor likely secondary no malignancy.  Area was ulcerated and oozing.  Bleeding has stopped although I been asked to evaluate him for possible further radiation therapy to the area to prevent further bleeding.  His last PET scan was back in June showing hypermetabolic lymph nodes in the neck chest abdomen and pelvis as well as enlarging right hepatic lobe metastasis.  He is seen today as not had bleeding for 2 weeks is having no significant dysphagia at this time.  COMPLICATIONS OF TREATMENT: none  FOLLOW UP COMPLIANCE: keeps appointments   PHYSICAL EXAM:  BP 123/83   Pulse (!) 102   Temp 98.4 F (36.9 C) (Tympanic)   Resp (!) 24   Ht 5\' 9"  (1.753 m) Comment: stated ht  Wt 210 lb (95.3 kg)   BMI 31.01 kg/m  Well-developed well-nourished patient in NAD. HEENT reveals PERLA, EOMI, discs not visualized.  Oral cavity is clear. No oral mucosal lesions are identified. Neck is clear without evidence of cervical or supraclavicular adenopathy. Lungs are clear to A&P. Cardiac examination is essentially unremarkable with regular rate and rhythm without murmur rub or thrill. Abdomen is benign with no organomegaly or masses noted. Motor sensory and DTR levels are equal and symmetric in the upper and lower extremities. Cranial nerves  II through XII are grossly intact. Proprioception is intact. No peripheral adenopathy or edema is identified. No motor or sensory levels are noted. Crude visual fields are within normal range.  RADIOLOGY RESULTS: PET CT scan reviewed new PET CT scan ordered  PLAN: At this time I reviewed her tests PET/CT.  I will target the area of the distal esophagus esophagus which is hypermetabolic on PET scan with a palliative dose of 30 Gray in 15 fractions.  Risks benefits of treatment clued possible further esophagitis possible stricture possible fatigue alteration blood counts all were described in detail to the patient.  Will review the PET scan I am have scheduled simulation to commence about a week after that.  Patient comprehends her recommendations well.  I would like to take this opportunity to thank you for allowing me to participate in the care of your patient.Carmina Miller, MD

## 2023-05-15 ENCOUNTER — Other Ambulatory Visit: Payer: No Typology Code available for payment source

## 2023-05-15 ENCOUNTER — Encounter: Payer: Self-pay | Admitting: Oncology

## 2023-05-15 ENCOUNTER — Inpatient Hospital Stay: Payer: No Typology Code available for payment source

## 2023-05-15 ENCOUNTER — Ambulatory Visit: Payer: No Typology Code available for payment source

## 2023-05-15 ENCOUNTER — Other Ambulatory Visit: Payer: Self-pay

## 2023-05-15 ENCOUNTER — Inpatient Hospital Stay: Payer: No Typology Code available for payment source | Admitting: Oncology

## 2023-05-15 VITALS — BP 139/97 | HR 75 | Temp 97.1°F | Resp 20

## 2023-05-15 VITALS — BP 132/78 | HR 86 | Temp 96.9°F | Wt 207.0 lb

## 2023-05-15 DIAGNOSIS — E876 Hypokalemia: Secondary | ICD-10-CM

## 2023-05-15 DIAGNOSIS — Z5111 Encounter for antineoplastic chemotherapy: Secondary | ICD-10-CM | POA: Diagnosis not present

## 2023-05-15 DIAGNOSIS — C159 Malignant neoplasm of esophagus, unspecified: Secondary | ICD-10-CM

## 2023-05-15 DIAGNOSIS — J984 Other disorders of lung: Secondary | ICD-10-CM

## 2023-05-15 DIAGNOSIS — G620 Drug-induced polyneuropathy: Secondary | ICD-10-CM

## 2023-05-15 DIAGNOSIS — T451X5A Adverse effect of antineoplastic and immunosuppressive drugs, initial encounter: Secondary | ICD-10-CM

## 2023-05-15 DIAGNOSIS — I82621 Acute embolism and thrombosis of deep veins of right upper extremity: Secondary | ICD-10-CM | POA: Diagnosis not present

## 2023-05-15 DIAGNOSIS — D500 Iron deficiency anemia secondary to blood loss (chronic): Secondary | ICD-10-CM | POA: Diagnosis not present

## 2023-05-15 DIAGNOSIS — Z5112 Encounter for antineoplastic immunotherapy: Secondary | ICD-10-CM | POA: Diagnosis not present

## 2023-05-15 LAB — CBC WITH DIFFERENTIAL (CANCER CENTER ONLY)
Abs Immature Granulocytes: 0.02 10*3/uL (ref 0.00–0.07)
Basophils Absolute: 0.1 10*3/uL (ref 0.0–0.1)
Basophils Relative: 1 %
Eosinophils Absolute: 0.1 10*3/uL (ref 0.0–0.5)
Eosinophils Relative: 2 %
HCT: 29.9 % — ABNORMAL LOW (ref 39.0–52.0)
Hemoglobin: 8.8 g/dL — ABNORMAL LOW (ref 13.0–17.0)
Immature Granulocytes: 0 %
Lymphocytes Relative: 16 %
Lymphs Abs: 1 10*3/uL (ref 0.7–4.0)
MCH: 26.9 pg (ref 26.0–34.0)
MCHC: 29.4 g/dL — ABNORMAL LOW (ref 30.0–36.0)
MCV: 91.4 fL (ref 80.0–100.0)
Monocytes Absolute: 0.6 10*3/uL (ref 0.1–1.0)
Monocytes Relative: 10 %
Neutro Abs: 4.3 10*3/uL (ref 1.7–7.7)
Neutrophils Relative %: 71 %
Platelet Count: 262 10*3/uL (ref 150–400)
RBC: 3.27 MIL/uL — ABNORMAL LOW (ref 4.22–5.81)
RDW: 21.4 % — ABNORMAL HIGH (ref 11.5–15.5)
WBC Count: 6 10*3/uL (ref 4.0–10.5)
nRBC: 0 % (ref 0.0–0.2)

## 2023-05-15 LAB — RETIC PANEL
Immature Retic Fract: 34.6 % — ABNORMAL HIGH (ref 2.3–15.9)
RBC.: 3.22 MIL/uL — ABNORMAL LOW (ref 4.22–5.81)
Retic Count, Absolute: 145.2 10*3/uL (ref 19.0–186.0)
Retic Ct Pct: 4.5 % — ABNORMAL HIGH (ref 0.4–3.1)
Reticulocyte Hemoglobin: 28.6 pg (ref >27.9)

## 2023-05-15 LAB — CMP (CANCER CENTER ONLY)
ALT: 25 U/L (ref 0–44)
AST: 28 U/L (ref 15–41)
Albumin: 3 g/dL — ABNORMAL LOW (ref 3.5–5.0)
Alkaline Phosphatase: 49 U/L (ref 38–126)
Anion gap: 6 (ref 5–15)
BUN: 5 mg/dL — ABNORMAL LOW (ref 8–23)
CO2: 23 mmol/L (ref 22–32)
Calcium: 8.6 mg/dL — ABNORMAL LOW (ref 8.9–10.3)
Chloride: 109 mmol/L (ref 98–111)
Creatinine: 0.88 mg/dL (ref 0.61–1.24)
GFR, Estimated: >60 mL/min (ref >60)
Glucose, Bld: 88 mg/dL (ref 70–99)
Potassium: 3.7 mmol/L (ref 3.5–5.1)
Sodium: 138 mmol/L (ref 135–145)
Total Bilirubin: 0.4 mg/dL (ref 0.3–1.2)
Total Protein: 6.7 g/dL (ref 6.5–8.1)

## 2023-05-15 MED ORDER — POTASSIUM CHLORIDE CRYS ER 20 MEQ PO TBCR: 40.0000 meq | EXTENDED_RELEASE_TABLET | Freq: Two times a day (BID) | ORAL | 1 refills | Status: DC

## 2023-05-15 MED ORDER — SODIUM CHLORIDE 0.9 % IV SOLN
Freq: Once | INTRAVENOUS | Status: AC
  Filled 2023-05-15: qty 250

## 2023-05-15 MED ORDER — SODIUM CHLORIDE 0.9 % IV SOLN
80.0000 mg/m2 | Freq: Once | INTRAVENOUS | Status: AC
  Administered 2023-05-15: 174 mg via INTRAVENOUS
  Filled 2023-05-15: qty 29

## 2023-05-15 MED ORDER — PANTOPRAZOLE SODIUM 40 MG PO TBEC: 40.0000 mg | DELAYED_RELEASE_TABLET | Freq: Two times a day (BID) | ORAL | 2 refills | Status: DC

## 2023-05-15 MED ORDER — FAMOTIDINE IN NACL 20-0.9 MG/50ML-% IV SOLN
20.0000 mg | Freq: Once | INTRAVENOUS | Status: AC
  Administered 2023-05-15: 20 mg via INTRAVENOUS
  Filled 2023-05-15: qty 50

## 2023-05-15 MED ORDER — DIPHENHYDRAMINE HCL 50 MG/ML IJ SOLN
50.0000 mg | Freq: Once | INTRAMUSCULAR | Status: AC
  Administered 2023-05-15: 50 mg via INTRAVENOUS
  Filled 2023-05-15: qty 1

## 2023-05-15 MED ORDER — HEPARIN SOD (PORK) LOCK FLUSH 100 UNIT/ML IV SOLN
500.0000 [IU] | Freq: Once | INTRAVENOUS | Status: AC | PRN
  Administered 2023-05-15: 500 [IU]
  Filled 2023-05-15: qty 5

## 2023-05-15 MED ORDER — SODIUM CHLORIDE 0.9 % IV SOLN
10.0000 mg | Freq: Once | INTRAVENOUS | Status: AC
  Administered 2023-05-15: 10 mg via INTRAVENOUS
  Filled 2023-05-15: qty 10
  Filled 2023-05-15: qty 1

## 2023-05-15 NOTE — Assessment & Plan Note (Addendum)
Stage IV esophageal adenocarcinoma, liver metastatic disease, HER 2 negative. KRAS G12V, TMB 5.3, MSI Stable.  CPS 65%  1st line  FOLFOX , palliative RT --> 12/2022 CT progression--> switch to 5-FU + Nivolumab Q2 weeks  --> 02/2023 PET progression- pneumonitis--> 3rd line  Taxol and Cyramza  Labs are reviewed and discussed with patient. Tolerates chemotherapy well.  Proceed with C2 D8 Taxol, he will return in 1 week for D15 Taxol and Cytamza  GI bleeding due to esophageal cancer, Dr. Rushie Chestnut will offer palliative RT, pending PET.

## 2023-05-15 NOTE — Progress Notes (Signed)
Hematology/Oncology Progress note [phone] [phone]        REFERRING PROVIDER: Rickard Patience, MD  CHIEF COMPLAINTS/PURPOSE OF CONSULTATION:  Stage IV Esophageal adenocarcinoma  ASSESSMENT & PLAN:   Cancer Staging  Adenocarcinoma of esophagus [facility]) Staging form: Esophagus - Adenocarcinoma, AJCC 8th Edition - Clinical stage from 08/28/2022: Stage IVB (cT3, cN1, cM1) - Signed by Rickard Patience, MD on 10/04/2022   Adenocarcinoma of esophagus (HCC) Stage IV esophageal adenocarcinoma, liver metastatic disease, HER 2 negative. KRAS G12V, TMB 5.3, MSI Stable.  CPS 65%  1st line  FOLFOX , palliative RT --> 12/2022 CT progression--> switch to 5-FU + Nivolumab Q2 weeks  --> 02/2023 PET progression- pneumonitis--> 3rd line  Taxol and Cyramza  Labs are reviewed and discussed with patient. Tolerates chemotherapy well.  Proceed with C2 D8 Taxol, he will return in 1 week for D15 Taxol and Cytamza  GI bleeding due to esophageal cancer, Dr. Rushie Chestnut will offer palliative RT, pending PET.   Deep venous thrombosis (HCC) Off Eliquis 2.5mg  BID due to GI bleeding  Hypokalemia Chronic hypokalemia Cotinue KCL to BID. Marland Kitchen      Encounter for antineoplastic chemotherapy Chemotherapy plan as listed above.   Chemotherapy-induced neuropathy (HCC) Grade 2, he prefers to hold of neuropathy treatments.  observation.   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  Prednisone tapering course was inturrupted during his admission due to GI bleeding.  Resumed on prednisone 10mg  daily due to recurrence of SOB/cough.    IDA (iron deficiency anemia) Lab Results  Component Value Date   HGB 8.8 (L) 05/15/2023   TIBC 353 04/25/2023   IRONPCTSAT 35 04/25/2023   FERRITIN 357 (H) 04/25/2023  Ferritin maybe falsely elevated . Anemia is likely due to chemotherapy and possible iron deficiency.  Off iron supplementation due to GI bleeding.  Hb has improved. Retic panel is improved. Will  arrange him to get one more Venofer treatment next week    Orders Placed This Encounter  Procedures   Retic Panel    Standing Status:   Future    Standing Expiration Date:   05/21/2024   CEA    Standing Status:   Future    Standing Expiration Date:   05/21/2024   Retic Panel    Standing Status:   Future    Standing Expiration Date:   06/18/2024   Retic Panel    Standing Status:   Future    Number of Occurrences:   1    Standing Expiration Date:   05/14/2024    Follow-up 3 weeks   All questions were answered. The patient knows to call the clinic with any problems, questions or concerns.  Rickard Patience, MD, PhD [facility], [facility] Hematology Oncology 05/15/2023   HISTORY OF PRESENTING ILLNESS:  KALIEL BOLDS 67 y.o. male presents to establish care for esophageal adenocarcinoma I have reviewed his chart and materials related to his cancer extensively and collaborated history with the patient. Summary of oncologic history is as follows: Oncology History  Adenocarcinoma of esophagus (HCC)  08/28/2022 Initial Diagnosis   Adenocarcinoma of esophagus   -Patient has noticed worsening of "food stuck/fullness" sensation since November 2023.  Patient had a barium swallow study which commented on marked mucosal irregularity in the distal esophagus with Broaddus base mural filling defect highly suspicious for malignancy.  Patient establish care with gastroenterology. -08/23/2022, EGD showed gastritis and partially obstructing malignant esophageal tumor in the lower third of the esophagus. Esophagus mass biopsy showed adenocarcinoma.  PD-L1 TPS 65%, HER2  negative.  [facility] NGS showed KRASG12V, CDKN2A, ARID1A, TP53, TMB 5.3, MSI stable.  Stomach biopsy showed gastric mucosa with no specific histology abnormality.  No significant intestinal metaplastic, dysplastic, granular atrophy or increased inflammation.     08/28/2022 Imaging   CT chest abdomen pelvis with contrast showed 1. Distal esophageal  primary with gastrohepatic ligament nodal metastasis. 2. 2 right-sided pulmonary nodules, the largest of which measures 5 mm and is new since 2015. Pulmonary metastasis not be excluded. 3. Anterior right lower lobe volume loss and minimal soft tissue density, favoring atelectasis or scar. Recommend attention on follow-up. 4. Hepatic steatosis 5. Cholelithiasis 6. Left nephrolithiasis 7. Coronary artery atherosclerosis. Aortic Atherosclerosis   08/28/2022 Cancer Staging   Staging form: Esophagus - Adenocarcinoma, AJCC 8th Edition - Clinical stage from 08/28/2022: Stage IVB (cT3, cN1, cM1) - Signed by Rickard Patience, MD on 10/04/2022 Stage prefix: Initial diagnosis   09/06/2022 Imaging   PET scan showed 1. Esophageal primary with gastrohepatic ligament nodal metastasis,as on CT. 2. Focus of hypermetabolism which is favored to registered to the posterior hepatic dome, in the region of subtle heterogeneity on prior diagnostic CT. Suboptimally evaluated secondary to underlying steatosis. Recommend further evaluation with pre and post contrast abdominal MRI to confirm probable metastasis. 3. Incidental findings, including: Left nephrolithiasis.Cholelithiasis. Coronary artery atherosclerosis. Aortic Atherosclerosis    09/14/2022 - 09/14/2022 Chemotherapy   Patient is on Treatment Plan : ESOPHAGUS Carboplatin + Paclitaxel Weekly X 6 Weeks with XRT     09/23/2022 Imaging   MRI abdomen with and without contrast showed 1. Mildly T2 hyperintense segment VII hepatic lesion measuring 2.7 cm with imaging characteristics compatible with metastatic disease. 2. Tiny focus of delayed enhancement in the inferior right lobe of the liver segment VI measuring 8 mm with ill-defined increased T2 signal and subtle corresponding reduced diffusivity, also suspicious for metastatic disease. 3. Partially visualized distal esophageal wall thickening compatible with the patient's known primary neoplasm. 4. Similar size of the 11  mm gastrohepatic ligament lymph node mildly metabolic on prior PET-CT and compatible with local nodal disease involvement. 5. 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 Recommend follow up pre and post-contrast MRI/MRCP in 1 year. 6. Diffuse hepatic steatosis.   10/10/2022 Procedure   LIVER MASS; CT-GUIDED BIOPSY:  - MODERATE TO POORLY DIFFERENTIATED ADENOCARCINOMA MORPHOLOGICALLY  CONSISTENT WITH METASTASIS FROM PATIENT'S KNOWN ESOPHAGEAL  ADENOCARCINOMA.    10/17/2022 Procedure   Medi port placed by Dr. Wyn Quaker   10/21/2022 - 01/15/2023 Chemotherapy   Patient is on Treatment Plan : ESOPHAGEAL ADENOCARCINOMA FOLFOX q14d x 6 cycles     01/15/2023 Imaging   PET showed  1. Interval progression in metastatic esophageal carcinoma as evidenced by hypermetabolic lymph nodes in the neck, chest, abdomen and pelvis, an enlarging right hepatic lobe metastasis, new bilateral adrenal metastases and new osseous metastases. 2. Cholelithiasis. 3. Left renal stones. 4. Aortic atherosclerosis (ICD10-I70.0). Coronary artery calcification.    01/27/2023 - 03/14/2023 Chemotherapy   Patient is on Treatment Plan : GASTROESOPHAGEAL FOLFOX + Nivolumab q14d     03/20/2023 Imaging   CT chest abdomen pelvis w contrast showed 1. Interval progression of metastatic disease with multiple new and enlarged hypodense lesions throughout the liver. 2. Significant enlargement of bilateral adrenal metastases. 3. Subtle sclerosis of an osseous metastasis of the right femoral neck. Other previously FDG avid osseous metastases of the left femoral neck and right sixth rib not appreciated  by CT. 4. No persistently enlarged lymph nodes. 5. Interval development of bandlike consolidation and fibrosis of the paramedian right lung, particularly of the right lower lobe, as well as additional scattered irregular opacities throughout the right lung. 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 6. Similar circumferential wall thickening throughout the mid to lower esophagus, consistent with known primary esophageal adenocarcinoma. 7. Nonobstructive bilateral nephrolithiasis. Aortic Atherosclerosis   03/27/2023 -  Chemotherapy   Patient is on Treatment Plan : GASTROESOPHAGEAL Ramucirumab D1, 15 + Paclitaxel D1,8,15 q28d     05/02/2023 - 05/08/2023 Hospital Admission   Admission due to coffee-ground emesis. EGD 10/7 showed bleeding from esophageal cancer, esophagitis due to chemo and radiation. Applied Hemospray. Eliquis was stopped. Acute blood loss anemia, received IV venofer treatments x 3 doses    Patient presents to establish care.  He is not taking PPI. He has intentionally lost some weight. Family history positive for father and paternal uncle with prostate cancer and sister with breast cancer. Denies any routine alcohol use  Right interval jugular vein occlusive DVT, started on Eliquis starter package on 10/28/2022, swelling of neck has improved.   CXR showed Interval development of bilateral perihilar interstitial opacities  He was treated with Azithromycin and Doxycycline to cover atypical infections.   INTERVAL HISTORY XANE AMSDEN is a 67 y.o. male who has above history reviewed by me today presents for follow up visit for Stage IV esophageal adenocarcinoma.  No additionally hematemesis episode.  Prednisone 10mg  resumed and his cough/sob symptoms are better.  He takes PPI.  He denies any SOB, abdominal pain.  + neuropathy of finger tips, toes/bottom of feet. "Rubbery"    MEDICAL HISTORY:  Past Medical History:  Diagnosis Date   Arthritis    Complication of anesthesia    OCCURRED ONCE YEARS AGO 1981   Esophageal mass    Hypertension    Hypothyroidism    PONV (postoperative nausea and vomiting)     SURGICAL HISTORY: Past Surgical History:  Procedure Laterality Date   COLONOSCOPY      ESOPHAGOGASTRODUODENOSCOPY N/A 08/23/2022   Procedure: ESOPHAGOGASTRODUODENOSCOPY (EGD);  Surgeon: Jaynie Collins, DO;  Location: [facility] ENDOSCOPY;  Service: Gastroenterology;  Laterality: N/A;   ESOPHAGOGASTRODUODENOSCOPY (EGD) WITH PROPOFOL N/A 05/05/2023   Procedure: ESOPHAGOGASTRODUODENOSCOPY (EGD) WITH PROPOFOL;  Surgeon: Jaynie Collins, DO;  Location: [facility] ENDOSCOPY;  Service: Gastroenterology;  Laterality: N/A;   EUS N/A 09/12/2022   Procedure: FULL UPPER ENDOSCOPIC ULTRASOUND (EUS) RADIAL;  Surgeon: Bearl Mulberry, MD;  Location: [facility] ENDOSCOPY;  Service: Gastroenterology;  Laterality: N/A;   FINGER SURGERY     HEMOSTASIS CONTROL  05/05/2023   Procedure: HEMOSTASIS CONTROL;  Surgeon: Jaynie Collins, DO;  Location: [facility] ENDOSCOPY;  Service: Gastroenterology;;   PORTA CATH INSERTION N/A 10/17/2022   Procedure: PORTA CATH INSERTION;  Surgeon: Annice Needy, MD;  Location: [facility] INVASIVE CV LAB;  Service: Cardiovascular;  Laterality: N/A;   TENDON REPAIR IN LEFT KNEE      SOCIAL HISTORY: Social History   Socioeconomic History   Marital status: Single    Spouse name: Not on file   Number of children: Not on file   Years of education: Not on file   Highest education level: Not on file  Occupational History   Not on file  Tobacco Use   Smoking status: Never   Smokeless tobacco: Never  Vaping Use   Vaping status: Never Used  Substance and Sexual Activity   Alcohol  use: Yes    Comment: OCCASIONALLY   Drug use: Never   Sexual activity: Not on file  Other Topics Concern   Not on file  Social History Narrative   Not on file   Social Determinants of Health   Financial Resource Strain: Low Risk  (10/29/2022)   Received from [facility], [facility]   Overall Financial Resource Strain (CARDIA)    Difficulty of Paying Living Expenses: Not hard at all  Food Insecurity: No Food Insecurity (05/03/2023)   Hunger Vital Sign     Worried About Running Out of Food in the Last Year: Never true    Ran Out of Food in the Last Year: Never true  Transportation Needs: No Transportation Needs (05/03/2023)   PRAPARE - Administrator, Civil Service (Medical): No    Lack of Transportation (Non-Medical): No  Physical Activity: Not on file  Stress: Not on file  Social Connections: Not on file  Intimate Partner Violence: Not At Risk (05/03/2023)   Humiliation, Afraid, Rape, and Kick questionnaire    Fear of Current or Ex-Partner: No    Emotionally Abused: No    Physically Abused: No    Sexually Abused: No    FAMILY HISTORY: Family History  Problem Relation Age of Onset   Heart attack Mother    Prostate cancer Father    Breast cancer Sister    Prostate cancer Paternal Uncle     ALLERGIES:  has No Known Allergies.  MEDICATIONS:  Current Outpatient Medications  Medication Sig Dispense Refill   acetaminophen (TYLENOL) 650 MG CR tablet Take 1,300 mg by mouth every 8 (eight) hours as needed for pain.     amoxicillin-clavulanate (AUGMENTIN) 875-125 MG tablet Take 1 tablet by mouth 2 (two) times daily. 20 tablet 0   azelastine (ASTELIN) 0.1 % nasal spray Place 1 spray into both nostrils 2 (two) times daily. Use in each nostril as directed 30 mL 1   benzonatate (TESSALON) 200 MG capsule Take 1 capsule (200 mg total) by mouth 3 (three) times daily as needed for cough. 20 capsule 5   chlorhexidine (PERIDEX) 0.12 % solution USE AS DIRECTED TAKE  15  ML  IN  THE  MOUTH  OR  THROAT  TWICE  DAILY 473 mL 0   Cholecalciferol (VITAMIN D3) 125 MCG (5000 UT) CAPS Take 5,000 Units by mouth daily.     levothyroxine (SYNTHROID) 125 MCG tablet Take 125 mcg by mouth every morning.     Multiple Vitamins-Minerals (MULTIVITAMIN WITH MINERALS) tablet Take 1 tablet by mouth daily.     ondansetron (ZOFRAN) 8 MG tablet Take 1 tablet (8 mg total) by mouth every 8 (eight) hours as needed for nausea or vomiting. Start on the third day after  chemotherapy.     predniSONE (DELTASONE) 10 MG tablet Take 1 tablet (10 mg total) by mouth daily with breakfast. 20 tablet 0   prochlorperazine (COMPAZINE) 10 MG tablet Take 1 tablet (10 mg total) by mouth every 6 (six) hours as needed for nausea or vomiting.     pantoprazole (PROTONIX) 40 MG tablet Take 1 tablet (40 mg total) by mouth 2 (two) times daily. 60 tablet 2   potassium chloride SA (KLOR-CON M) 20 MEQ tablet Take 2 tablets (40 mEq total) by mouth 2 (two) times daily. 60 tablet 1   No current facility-administered medications for this visit.   Facility-Administered Medications Ordered in Other Visits  Medication Dose Route Frequency Provider  Last Rate Last Admin   PACLitaxel (TAXOL) 174 mg in sodium chloride 0.9 % 250 mL chemo infusion (</= 80mg /m2)  80 mg/m2 (Treatment Plan Recorded) Intravenous Once Rickard Patience, MD 279 mL/hr at 05/15/23 1028 174 mg at 05/15/23 1028    Review of Systems  Constitutional:  Negative for appetite change, chills, fatigue and fever.  HENT:   Negative for hearing loss and voice change.        Nasal congestion, postnasal drip.   Eyes:  Negative for eye problems and icterus.  Respiratory:  Negative for chest tightness, cough and shortness of breath.   Cardiovascular:  Negative for chest pain and leg swelling.  Gastrointestinal:  Negative for abdominal distention, abdominal pain, nausea and vomiting.  Endocrine: Negative for hot flashes.  Genitourinary:  Negative for difficulty urinating, dysuria and frequency.   Musculoskeletal:  Negative for arthralgias.  Skin:  Negative for itching and rash.  Neurological:  Positive for numbness. Negative for light-headedness.  Hematological:  Negative for adenopathy. Does not bruise/bleed easily.  Psychiatric/Behavioral:  Negative for confusion.      PHYSICAL EXAMINATION: ECOG PERFORMANCE STATUS: 0 - Asymptomatic  Vitals:   05/15/23 0828  BP: 132/78  Pulse: 86  Temp: (!) 96.9 F (36.1 C)  SpO2: 99%   Filed  Weights   05/15/23 0828  Weight: 207 lb (93.9 kg)    Physical Exam Constitutional:      General: He is not in acute distress.    Appearance: He is obese. He is not diaphoretic.  HENT:     Head: Normocephalic and atraumatic.  Eyes:     General: No scleral icterus. Cardiovascular:     Rate and Rhythm: Normal rate and regular rhythm.  Pulmonary:     Effort: Pulmonary effort is normal. No respiratory distress.     Breath sounds: No wheezing.  Abdominal:     General: There is no distension.     Palpations: Abdomen is soft.  Musculoskeletal:        General: Normal range of motion.     Cervical back: Normal range of motion and neck supple.  Skin:    General: Skin is warm and dry.     Findings: No erythema.  Neurological:     Mental Status: He is alert and oriented to person, place, and time. Mental status is at baseline.     Cranial Nerves: No cranial nerve deficit.     Motor: No abnormal muscle tone.  Psychiatric:        Mood and Affect: Mood and affect normal.      LABORATORY DATA:  I have reviewed the data as listed    Latest Ref Rng & Units 05/15/2023    8:00 AM 05/12/2023   11:11 AM 05/08/2023    4:43 AM  CBC  WBC 4.0 - 10.5 K/uL 6.0  5.8  3.9   Hemoglobin 13.0 - 17.0 g/dL 8.8  8.5  8.3   Hematocrit 39.0 - 52.0 % 29.9  28.2  27.8   Platelets 150 - 400 K/uL 262  245  190       Latest Ref Rng & Units 05/15/2023    8:00 AM 05/08/2023    4:43 AM 05/06/2023    4:58 AM  CMP  Glucose 70 - 99 mg/dL 88  86  93   BUN 8 - 23 mg/dL 5  8  12    Creatinine 0.61 - 1.24 mg/dL 9.60  4.54  0.98   Sodium 135 - 145 mmol/L  138  137  135   Potassium 3.5 - 5.1 mmol/L 3.7  3.7  3.4   Chloride 98 - 111 mmol/L 109  105  105   CO2 22 - 32 mmol/L 23  22  24    Calcium 8.9 - 10.3 mg/dL 8.6  8.0  8.3   Total Protein 6.5 - 8.1 g/dL 6.7     Total Bilirubin 0.3 - 1.2 mg/dL 0.4     Alkaline Phos 38 - 126 U/L 49     AST 15 - 41 U/L 28     ALT 0 - 44 U/L 25        RADIOGRAPHIC  STUDIES: I have personally reviewed the radiological images as listed and agreed with the findings in the report. CT ABDOMEN PELVIS W CONTRAST  Result Date: 05/02/2023 CLINICAL DATA:  Esophageal cancer, abdominal pain, vomiting, concern for esophageal erosion and GI bleeding EXAM: CT ABDOMEN AND PELVIS WITH CONTRAST TECHNIQUE: Multidetector CT imaging of the abdomen and pelvis was performed using the standard protocol following bolus administration of intravenous contrast. RADIATION DOSE REDUCTION: This exam was performed according to the departmental dose-optimization program which includes automated exposure control, adjustment of the mA and/or kV according to patient size and/or use of iterative reconstruction technique. CONTRAST:  75mL OMNIPAQUE IOHEXOL 350 MG/ML SOLN COMPARISON:  CT chest abdomen pelvis, 03/19/2023 FINDINGS: Lower chest: Please see separately reported examination of the chest. Hepatobiliary: Slightly diminished size of multiple hypodense liver lesions, largest index lesion of the posterior liver dome measuring 8.2 x 5.1 cm, previously 9.3 x 6.2 cm (series 4, image 12). Small gallstones. No gallbladder wall thickening, or biliary dilatation. Pancreas: Unremarkable. No pancreatic ductal dilatation or surrounding inflammatory changes. Spleen: Normal in size without significant abnormality. Adrenals/Urinary Tract: Diminished size of bilateral adrenal masses, on the right measuring 4.3 x 3.4 cm, previously 5.9 x 4.4 cm (series 4, image 19). Left-sided nodule is almost completely resolved and difficult to discretely measure (series 4, image 17). Simple, benign left renal cortical cysts, for which no further follow-up or characterization is required. Small nonobstructive bilateral renal calculi. No ureteral calculi or hydronephrosis. Bladder is unremarkable. Stomach/Bowel: Stomach is within normal limits. Appendix appears normal. No evidence of bowel wall thickening, distention, or inflammatory  changes. Descending and sigmoid diverticulosis. Vascular/Lymphatic: Aortic atherosclerosis. No enlarged abdominal or pelvic lymph nodes. Reproductive: No mass or other significant abnormality. Other: Small fat containing right inguinal hernia.  No ascites. Musculoskeletal: No acute or significant osseous findings. IMPRESSION: 1. No acute CT findings of the abdomen or pelvis to explain abdominal pain or vomiting. 2. Slightly diminished size of multiple hypodense liver lesions as well as bilateral adrenal masses, consistent with treatment response of abdominal metastatic disease. 3. Cholelithiasis. 4. Nonobstructive bilateral renal calculi. 5. Descending and sigmoid diverticulosis without evidence of acute diverticulitis. Aortic Atherosclerosis (ICD10-I70.0). Electronically Signed   By: Jearld Lesch M.D.   On: 05/02/2023 14:43   CT Angio Chest PE W and/or Wo Contrast  Result Date: 05/02/2023 CLINICAL DATA:  PE suspected, vomiting, esophageal cancer * Tracking Code: BO * EXAM: CT ANGIOGRAPHY CHEST WITH CONTRAST TECHNIQUE: Multidetector CT imaging of the chest was performed using the standard protocol during bolus administration of intravenous contrast. Multiplanar CT image reconstructions and MIPs were obtained to evaluate the vascular anatomy. RADIATION DOSE REDUCTION: This exam was performed according to the departmental dose-optimization program which includes automated exposure control, adjustment of the mA and/or kV according to patient size and/or use of iterative reconstruction technique. CONTRAST:  75mL OMNIPAQUE IOHEXOL 350 MG/ML SOLN COMPARISON:  CT chest abdomen pelvis, 03/19/2023 FINDINGS: Cardiovascular: Satisfactory opacification of the pulmonary arteries to the segmental level. No evidence of pulmonary embolism. Normal heart size. Left coronary artery calcifications. No pericardial effusion. Right chest port catheter. Aortic atherosclerosis. Mediastinum/Nodes: No enlarged mediastinal, hilar, or  axillary lymph nodes. Circumferential wall thickening of the lower third of the esophagus (series 2, image 97). Thyroid gland and trachea demonstrate no significant findings. Lungs/Pleura: Perihilar and infrahilar fibrosis of the right lower lobe, unchanged in appearance compared to prior examination (series 3, image 62). Bibasilar scarring or atelectasis. No pleural effusion or pneumothorax. Upper Abdomen: Please see separately reported examination of the abdomen and pelvis. Musculoskeletal: No chest wall abnormality. No acute osseous findings. Review of the MIP images confirms the above findings. IMPRESSION: 1. Negative examination for pulmonary embolism. 2. Circumferential wall thickening of the lower third of the esophagus, consistent with known esophageal malignancy. 3. Unchanged fibrotic scarring and volume loss of the perihilar right lung and right lower lobe. 4. Coronary artery disease. Aortic Atherosclerosis (ICD10-I70.0). Electronically Signed   By: Jearld Lesch M.D.   On: 05/02/2023 14:36   DG Chest 2 View  Result Date: 05/02/2023 CLINICAL DATA:  History of esophageal cancer with chest tightness and epigastric pain EXAM: CHEST - 2 VIEW COMPARISON:  Chest radiograph dated 02/19/2023 FINDINGS: Lines/tubes: Right chest wall port tip projects over the superior cavoatrial junction. Lungs: Mildly low lung volumes. Well inflated lungs. Bilateral perihilar linear scarring. No focal consolidations. Pleura: Unchanged blunting of the right costophrenic angle. No pneumothorax. Heart/mediastinum: The heart size and mediastinal contours are within normal limits. Bones: No acute osseous abnormality. IMPRESSION: 1. No acute cardiopulmonary process. 2. Unchanged blunting of the right costophrenic angle, which may represent a small pleural effusion or pleural thickening. Electronically Signed   By: Agustin Cree M.D.   On: 05/02/2023 13:00

## 2023-05-15 NOTE — Patient Instructions (Signed)
[place] [facility]  Discharge Instructions: Thank you for choosing [facility] to provide your oncology and hematology care.  If you have a lab appointment with the Cancer Center, please go directly to the Cancer Center and check in at the registration area.  Wear comfortable clothing and clothing appropriate for easy access to any Portacath or PICC line.   We strive to give you quality time with your provider. You may need to reschedule your appointment if you arrive late (15 or more minutes).  Arriving late affects you and other patients whose appointments are after yours.  Also, if you miss three or more appointments without notifying the office, you may be dismissed from the clinic at the provider's discretion.      For prescription refill requests, have your pharmacy contact our office and allow 72 hours for refills to be completed.    Today you received the following chemotherapy and/or immunotherapy agents taxol      To help prevent nausea and vomiting after your treatment, we encourage you to take your nausea medication as directed.  BELOW ARE SYMPTOMS THAT SHOULD BE REPORTED IMMEDIATELY: *FEVER GREATER THAN 100.4 F (38 C) OR HIGHER *CHILLS OR SWEATING *NAUSEA AND VOMITING THAT IS NOT CONTROLLED WITH YOUR NAUSEA MEDICATION *UNUSUAL SHORTNESS OF BREATH *UNUSUAL BRUISING OR BLEEDING *URINARY PROBLEMS (pain or burning when urinating, or frequent urination) *BOWEL PROBLEMS (unusual diarrhea, constipation, pain near the anus) TENDERNESS IN MOUTH AND THROAT WITH OR WITHOUT PRESENCE OF ULCERS (sore throat, sores in mouth, or a toothache) UNUSUAL RASH, SWELLING OR PAIN  UNUSUAL VAGINAL DISCHARGE OR ITCHING   Items with * indicate a potential emergency and should be followed up as soon as possible or go to the Emergency Department if any problems should occur.  Please show the CHEMOTHERAPY ALERT CARD or IMMUNOTHERAPY ALERT CARD at check-in to the  Emergency Department and triage nurse.  Should you have questions after your visit or need to cancel or reschedule your appointment, please contact [place] [facility]  [phone] and follow the prompts.  Office hours are 8:00 a.m. to 4:30 p.m. Monday - Friday. Please note that voicemails left after 4:00 p.m. may not be returned until the following business day.  We are closed weekends and major holidays. You have access to a nurse at all times for urgent questions. Please call the main number to the clinic [phone] and follow the prompts.  For any non-urgent questions, you may also contact your provider using MyChart. We now offer e-Visits for anyone 32 and older to request care online for non-urgent symptoms. For details visit [email or web address].   Also download the MyChart app! Go to the app store, search "MyChart", open the app, select , and log in with your MyChart username and password.

## 2023-05-15 NOTE — Assessment & Plan Note (Signed)
Chemotherapy plan as listed above

## 2023-05-15 NOTE — Assessment & Plan Note (Signed)
Off Eliquis 2.5mg  BID due to GI bleeding

## 2023-05-15 NOTE — Assessment & Plan Note (Signed)
Due to radiation/immunotherapy.  Prednisone tapering course was inturrupted during his admission due to GI bleeding.  Resumed on prednisone 10mg  daily due to recurrence of SOB/cough.

## 2023-05-15 NOTE — Assessment & Plan Note (Signed)
Chronic hypokalemia Cotinue KCL to BID. Marland Kitchen

## 2023-05-15 NOTE — Assessment & Plan Note (Signed)
Grade 2, he prefers to hold of neuropathy treatments.  observation.

## 2023-05-15 NOTE — Assessment & Plan Note (Addendum)
Lab Results  Component Value Date   HGB 8.8 (L) 05/15/2023   TIBC 353 04/25/2023   IRONPCTSAT 35 04/25/2023   FERRITIN 357 (H) 04/25/2023  Ferritin maybe falsely elevated . Anemia is likely due to chemotherapy and possible iron deficiency.  Off iron supplementation due to GI bleeding.  Hb has improved. Retic panel is improved. Will arrange him to get one more Venofer treatment next week

## 2023-05-19 ENCOUNTER — Encounter: Payer: Self-pay | Admitting: Oncology

## 2023-05-21 ENCOUNTER — Ambulatory Visit
Admission: RE | Admit: 2023-05-21 | Discharge: 2023-05-21 | Disposition: A | Discharge status: Home / Self Care | Payer: No Typology Code available for payment source | Source: Ambulatory Visit | Attending: Radiation Oncology | Admitting: Radiation Oncology

## 2023-05-21 DIAGNOSIS — J9811 Atelectasis: Secondary | ICD-10-CM | POA: Diagnosis not present

## 2023-05-21 DIAGNOSIS — N200 Calculus of kidney: Secondary | ICD-10-CM | POA: Diagnosis not present

## 2023-05-21 DIAGNOSIS — C787 Secondary malignant neoplasm of liver and intrahepatic bile duct: Secondary | ICD-10-CM | POA: Insufficient documentation

## 2023-05-21 DIAGNOSIS — K8020 Calculus of gallbladder without cholecystitis without obstruction: Secondary | ICD-10-CM | POA: Insufficient documentation

## 2023-05-21 DIAGNOSIS — M899 Disorder of bone, unspecified: Secondary | ICD-10-CM | POA: Insufficient documentation

## 2023-05-21 DIAGNOSIS — E279 Disorder of adrenal gland, unspecified: Secondary | ICD-10-CM | POA: Insufficient documentation

## 2023-05-21 DIAGNOSIS — C159 Malignant neoplasm of esophagus, unspecified: Secondary | ICD-10-CM | POA: Insufficient documentation

## 2023-05-21 DIAGNOSIS — Z923 Personal history of irradiation: Secondary | ICD-10-CM | POA: Diagnosis not present

## 2023-05-21 LAB — GLUCOSE, CAPILLARY: Glucose-Capillary: 79 mg/dL (ref 70–99)

## 2023-05-21 MED ORDER — FLUDEOXYGLUCOSE F - 18 (FDG) INJECTION
10.5000 | Freq: Once | INTRAVENOUS | Status: AC
  Administered 2023-05-21: 10.83 via INTRAVENOUS

## 2023-05-22 ENCOUNTER — Inpatient Hospital Stay: Payer: No Typology Code available for payment source

## 2023-05-22 ENCOUNTER — Other Ambulatory Visit: Payer: Self-pay

## 2023-05-22 ENCOUNTER — Other Ambulatory Visit: Payer: Self-pay | Admitting: Radiation Oncology

## 2023-05-22 VITALS — BP 129/83 | HR 97 | Temp 97.5°F | Resp 20

## 2023-05-22 DIAGNOSIS — Z5112 Encounter for antineoplastic immunotherapy: Secondary | ICD-10-CM | POA: Diagnosis not present

## 2023-05-22 DIAGNOSIS — D62 Acute posthemorrhagic anemia: Secondary | ICD-10-CM

## 2023-05-22 DIAGNOSIS — C159 Malignant neoplasm of esophagus, unspecified: Secondary | ICD-10-CM

## 2023-05-22 DIAGNOSIS — D500 Iron deficiency anemia secondary to blood loss (chronic): Secondary | ICD-10-CM

## 2023-05-22 DIAGNOSIS — C801 Malignant (primary) neoplasm, unspecified: Secondary | ICD-10-CM

## 2023-05-22 LAB — RETIC PANEL
Immature Retic Fract: 38 % — ABNORMAL HIGH (ref 2.3–15.9)
RBC.: 3.62 MIL/uL — ABNORMAL LOW (ref 4.22–5.81)
Retic Count, Absolute: 27.5 10*3/uL (ref 19.0–186.0)
Retic Ct Pct: 0.8 % (ref 0.4–3.1)
Reticulocyte Hemoglobin: 28.6 pg (ref >27.9)

## 2023-05-22 LAB — CBC WITH DIFFERENTIAL (CANCER CENTER ONLY)
Abs Immature Granulocytes: 0.02 10*3/uL (ref 0.00–0.07)
Basophils Absolute: 0.1 10*3/uL (ref 0.0–0.1)
Basophils Relative: 1 %
Eosinophils Absolute: 0.1 10*3/uL (ref 0.0–0.5)
Eosinophils Relative: 1 %
HCT: 32.2 % — ABNORMAL LOW (ref 39.0–52.0)
Hemoglobin: 9.8 g/dL — ABNORMAL LOW (ref 13.0–17.0)
Immature Granulocytes: 0 %
Lymphocytes Relative: 21 %
Lymphs Abs: 1.1 10*3/uL (ref 0.7–4.0)
MCH: 27.4 pg (ref 26.0–34.0)
MCHC: 30.4 g/dL (ref 30.0–36.0)
MCV: 89.9 fL (ref 80.0–100.0)
Monocytes Absolute: 0.4 10*3/uL (ref 0.1–1.0)
Monocytes Relative: 9 %
Neutro Abs: 3.5 10*3/uL (ref 1.7–7.7)
Neutrophils Relative %: 68 %
Platelet Count: 280 10*3/uL (ref 150–400)
RBC: 3.58 MIL/uL — ABNORMAL LOW (ref 4.22–5.81)
RDW: 20.3 % — ABNORMAL HIGH (ref 11.5–15.5)
WBC Count: 5.1 10*3/uL (ref 4.0–10.5)
nRBC: 0 % (ref 0.0–0.2)

## 2023-05-22 LAB — CMP (CANCER CENTER ONLY)
ALT: 28 U/L (ref 0–44)
AST: 26 U/L (ref 15–41)
Albumin: 3.5 g/dL (ref 3.5–5.0)
Alkaline Phosphatase: 56 U/L (ref 38–126)
Anion gap: 10 (ref 5–15)
BUN: 9 mg/dL (ref 8–23)
CO2: 22 mmol/L (ref 22–32)
Calcium: 9 mg/dL (ref 8.9–10.3)
Chloride: 106 mmol/L (ref 98–111)
Creatinine: 0.96 mg/dL (ref 0.61–1.24)
GFR, Estimated: >60 mL/min (ref >60)
Glucose, Bld: 91 mg/dL (ref 70–99)
Potassium: 3.8 mmol/L (ref 3.5–5.1)
Sodium: 138 mmol/L (ref 135–145)
Total Bilirubin: 0.7 mg/dL (ref 0.3–1.2)
Total Protein: 7.2 g/dL (ref 6.5–8.1)

## 2023-05-22 LAB — PROTEIN, URINE, RANDOM: Total Protein, Urine: 51 mg/dL

## 2023-05-22 MED ORDER — RAMUCIRUMAB CHEMO INJECTION 500 MG/50ML
8.0000 mg/kg | Freq: Once | INTRAVENOUS | Status: AC
  Administered 2023-05-22: 800 mg via INTRAVENOUS
  Filled 2023-05-22: qty 30

## 2023-05-22 MED ORDER — DIPHENHYDRAMINE HCL 50 MG/ML IJ SOLN
50.0000 mg | Freq: Once | INTRAMUSCULAR | Status: AC
  Administered 2023-05-22: 50 mg via INTRAVENOUS
  Filled 2023-05-22: qty 1

## 2023-05-22 MED ORDER — SODIUM CHLORIDE 0.9 % IV SOLN
80.0000 mg/m2 | Freq: Once | INTRAVENOUS | Status: AC
  Administered 2023-05-22: 174 mg via INTRAVENOUS
  Filled 2023-05-22: qty 29

## 2023-05-22 MED ORDER — HEPARIN SOD (PORK) LOCK FLUSH 100 UNIT/ML IV SOLN
500.0000 [IU] | Freq: Once | INTRAVENOUS | Status: DC | PRN
Start: 2023-05-22 — End: 2023-05-22
  Filled 2023-05-22: qty 5

## 2023-05-22 MED ORDER — DEXAMETHASONE SODIUM PHOSPHATE 10 MG/ML IJ SOLN
10.0000 mg | Freq: Once | INTRAMUSCULAR | Status: AC
  Administered 2023-05-22: 10 mg via INTRAVENOUS
  Filled 2023-05-22: qty 1

## 2023-05-22 MED ORDER — SODIUM CHLORIDE 0.9% FLUSH
10.0000 mL | INTRAVENOUS | Status: DC | PRN
  Filled 2023-05-22: qty 10

## 2023-05-22 MED ORDER — FAMOTIDINE IN NACL 20-0.9 MG/50ML-% IV SOLN
20.0000 mg | Freq: Once | INTRAVENOUS | Status: AC
Start: 2023-05-22 — End: 2023-05-22
  Administered 2023-05-22: 20 mg via INTRAVENOUS
  Filled 2023-05-22: qty 50

## 2023-05-22 MED ORDER — IRON SUCROSE 20 MG/ML IV SOLN
200.0000 mg | Freq: Once | INTRAVENOUS | Status: AC
  Administered 2023-05-22: 200 mg via INTRAVENOUS
  Filled 2023-05-22: qty 10

## 2023-05-22 MED ORDER — SODIUM CHLORIDE 0.9 % IV SOLN
Freq: Once | INTRAVENOUS | Status: AC
  Filled 2023-05-22: qty 250

## 2023-05-22 NOTE — Patient Instructions (Signed)
[place] [facility]  Discharge Instructions: Thank you for choosing [facility] to provide your oncology and hematology care.  If you have a lab appointment with the Cancer Center, please go directly to the Cancer Center and check in at the registration area.  Wear comfortable clothing and clothing appropriate for easy access to any Portacath or PICC line.   We strive to give you quality time with your provider. You may need to reschedule your appointment if you arrive late (15 or more minutes).  Arriving late affects you and other patients whose appointments are after yours.  Also, if you miss three or more appointments without notifying the office, you may be dismissed from the clinic at the provider's discretion.      For prescription refill requests, have your pharmacy contact our office and allow 72 hours for refills to be completed.    Today you received the following chemotherapy and/or immunotherapy agents Cyramza & Taxol      To help prevent nausea and vomiting after your treatment, we encourage you to take your nausea medication as directed.  BELOW ARE SYMPTOMS THAT SHOULD BE REPORTED IMMEDIATELY: *FEVER GREATER THAN 100.4 F (38 C) OR HIGHER *CHILLS OR SWEATING *NAUSEA AND VOMITING THAT IS NOT CONTROLLED WITH YOUR NAUSEA MEDICATION *UNUSUAL SHORTNESS OF BREATH *UNUSUAL BRUISING OR BLEEDING *URINARY PROBLEMS (pain or burning when urinating, or frequent urination) *BOWEL PROBLEMS (unusual diarrhea, constipation, pain near the anus) TENDERNESS IN MOUTH AND THROAT WITH OR WITHOUT PRESENCE OF ULCERS (sore throat, sores in mouth, or a toothache) UNUSUAL RASH, SWELLING OR PAIN  UNUSUAL VAGINAL DISCHARGE OR ITCHING   Items with * indicate a potential emergency and should be followed up as soon as possible or go to the Emergency Department if any problems should occur.  Please show the CHEMOTHERAPY ALERT CARD or IMMUNOTHERAPY ALERT CARD at  check-in to the Emergency Department and triage nurse.  Should you have questions after your visit or need to cancel or reschedule your appointment, please contact [place] [facility]  [phone] and follow the prompts.  Office hours are 8:00 a.m. to 4:30 p.m. Monday - Friday. Please note that voicemails left after 4:00 p.m. may not be returned until the following business day.  We are closed weekends and major holidays. You have access to a nurse at all times for urgent questions. Please call the main number to the clinic [phone] and follow the prompts.  For any non-urgent questions, you may also contact your provider using MyChart. We now offer e-Visits for anyone 23 and older to request care online for non-urgent symptoms. For details visit [email or web address].   Also download the MyChart app! Go to the app store, search "MyChart", open the app, select [place], and log in with your MyChart username and password.

## 2023-05-23 ENCOUNTER — Inpatient Hospital Stay: Payer: No Typology Code available for payment source

## 2023-05-23 ENCOUNTER — Inpatient Hospital Stay: Payer: No Typology Code available for payment source | Admitting: Oncology

## 2023-05-23 LAB — CEA: CEA: 72.4 ng/mL — ABNORMAL HIGH (ref 0.0–4.7)

## 2023-05-26 DIAGNOSIS — C158 Malignant neoplasm of overlapping sites of esophagus: Secondary | ICD-10-CM | POA: Diagnosis not present

## 2023-05-27 ENCOUNTER — Other Ambulatory Visit: Payer: Self-pay | Admitting: Hospice and Palliative Medicine

## 2023-05-28 ENCOUNTER — Encounter: Payer: Self-pay | Admitting: Oncology

## 2023-05-28 DIAGNOSIS — Z515 Encounter for palliative care: Secondary | ICD-10-CM | POA: Diagnosis not present

## 2023-05-28 DIAGNOSIS — Z008 Encounter for other general examination: Secondary | ICD-10-CM | POA: Diagnosis not present

## 2023-05-28 LAB — PROTEIN ELECTRO, RANDOM URINE
Albumin ELP, Urine: 47.4 %
Alpha-1-Globulin, U: 6 %
Alpha-2-Globulin, U: 8.6 %
Beta Globulin, U: 26.3 %
Gamma Globulin, U: 11.7 %
Total Protein, Urine: 73.2 mg/dL

## 2023-05-29 ENCOUNTER — Other Ambulatory Visit: Payer: No Typology Code available for payment source

## 2023-05-29 ENCOUNTER — Ambulatory Visit
Admission: RE | Admit: 2023-05-29 | Discharge: 2023-05-29 | Disposition: A | Discharge status: Home / Self Care | Payer: No Typology Code available for payment source | Source: Ambulatory Visit | Attending: Radiation Oncology | Admitting: Radiation Oncology

## 2023-05-29 VITALS — BP 110/94 | HR 128 | Temp 97.0°F | Resp 20 | Ht 69.0 in

## 2023-05-29 DIAGNOSIS — C155 Malignant neoplasm of lower third of esophagus: Secondary | ICD-10-CM | POA: Diagnosis present

## 2023-05-29 DIAGNOSIS — R0789 Other chest pain: Secondary | ICD-10-CM | POA: Diagnosis not present

## 2023-05-29 DIAGNOSIS — C801 Malignant (primary) neoplasm, unspecified: Secondary | ICD-10-CM

## 2023-05-29 DIAGNOSIS — C787 Secondary malignant neoplasm of liver and intrahepatic bile duct: Secondary | ICD-10-CM | POA: Diagnosis not present

## 2023-05-29 DIAGNOSIS — E279 Disorder of adrenal gland, unspecified: Secondary | ICD-10-CM | POA: Diagnosis not present

## 2023-05-29 DIAGNOSIS — C7951 Secondary malignant neoplasm of bone: Secondary | ICD-10-CM | POA: Diagnosis not present

## 2023-05-29 DIAGNOSIS — C158 Malignant neoplasm of overlapping sites of esophagus: Secondary | ICD-10-CM | POA: Diagnosis not present

## 2023-05-29 DIAGNOSIS — Z79899 Other long term (current) drug therapy: Secondary | ICD-10-CM | POA: Diagnosis not present

## 2023-05-29 NOTE — Progress Notes (Signed)
Radiation Oncology Follow up Note  Name: Jonathan [name]   Date:   05/29/2023 MRN:  [id number] DOB: 07-22-1956    This 67 y.o. 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 Recently, he has been experiencing bleeding from the esophageal adenocarcinoma. He describes a sensation of tightness in the chest, particularly when consuming cold beverages. He also reports occasional headaches, but denies any back pain. The patient's recent PET CT scan showed progressive hypermetabolic activity in the distal esophagus, new and enlarging metastatic hepatic lesions, an enlarged and progressive hypermetabolic right adrenal gland mass, and bone lesions in T12 and L2 vertebral body and left sacrum. There is also a consideration for possible brain lesions, for which an MRI will be ordered..  COMPLICATIONS OF TREATMENT: none  FOLLOW UP COMPLIANCE: keeps appointments   PHYSICAL EXAM:  BP (!) 110/94   Pulse (!) 128   Temp (!) 97 F (36.1 C)   Resp 20   Ht 5\' 9"  (1.753 m)   BMI 29.98 kg/m  Well-developed well-nourished patient in NAD. HEENT reveals PERLA, EOMI, discs not visualized.  Oral cavity is clear. No oral mucosal lesions are identified. Neck is clear without evidence of cervical or supraclavicular adenopathy. Lungs are clear to A&P. Cardiac examination is essentially unremarkable with regular rate and rhythm without murmur rub or thrill. Abdomen is benign with no organomegaly or masses noted. Motor sensory and DTR levels are equal and symmetric in the upper and lower extremities. Cranial nerves II through XII are grossly intact. Proprioception is intact. No peripheral adenopathy or  edema is identified. No motor or sensory levels are noted. Crude visual fields are within normal range.  RADIOLOGY RESULTS: PET CT scan reviewed MRI of brain ordered  PLAN: Distal Esophageal Adenocarcinoma Stage IV Progressive disease with recent bleeding. PET CT shows hypermetabolic activity in distal esophagus, new and enlarging hepatic metastases, progressive right adrenal gland mass, and bone lesions in T12, L2 vertebral body, and left sacrum. Possible brain lesions. -Plan palliative radiation therapy (30 gray in 15 fractions). -Order brain MRI to evaluate for possible brain lesions.  Liver Metastases Stable with some decrease in size. -Continue current management.  Adrenal Gland Mass Progressive hypermetabolic right adrenal gland mass. -Continue current management.  Bone Metastases New bone lesions in T12, L2 vertebral body, and left sacrum. -Continue current management.    Carmina Miller, MD

## 2023-05-30 ENCOUNTER — Other Ambulatory Visit: Payer: No Typology Code available for payment source

## 2023-05-30 ENCOUNTER — Ambulatory Visit: Payer: No Typology Code available for payment source

## 2023-05-30 ENCOUNTER — Ambulatory Visit: Payer: Self-pay | Admitting: *Deleted

## 2023-05-30 NOTE — Patient Outreach (Signed)
  Care Coordination   Initial Visit Note   05/30/2023 Name: Jonathan [name] MRN: [id number] DOB: April 12, 1956  Jonathan [name] is a 67 y.o. year old male who sees [name], MD for primary care. I spoke with  Katharine Look by phone today.  What matters to the patients health and wellness today?  Spoke with patient, state he is doing well.  Looking forward to restarting radiation, but will have to wait until after MRI is complete next week.  He has several appointments coming up at the cancer center, does not require transportation assistance.  Denies any urgent concerns, encouraged to contact this care manager with questions.      Goals Addressed             This Visit's Progress    COMPLETED: Care Coordination activities - no follow up needed       Interventions Today    Flowsheet Row Most Recent Value  Chronic Disease   Chronic disease during today's visit Other  [esophageal cancer]  General Interventions   General Interventions Discussed/Reviewed General Interventions Reviewed, Doctor Visits  Doctor Visits Discussed/Reviewed Doctor Visits Reviewed, PCP, Specialist  PCP/Specialist Visits Compliance with follow-up visit  Education Interventions   Education Provided Provided Education  Provided Verbal Education On Nutrition, Medication, When to see the doctor  Nutrition Interventions   Nutrition Discussed/Reviewed Nutrition Reviewed, Supplemental nutrition, Adding fruits and vegetables, Increasing proteins  Pharmacy Interventions   Pharmacy Dicussed/Reviewed Medication Adherence, Pharmacy Topics Reviewed, Affording Medications              SDOH assessments and interventions completed:  Yes  SDOH Interventions Today    Flowsheet Row Most Recent Value  SDOH Interventions   Food Insecurity Interventions Intervention Not Indicated  Housing Interventions Intervention Not Indicated  Transportation Interventions Intervention Not Indicated  Utilities  Interventions Intervention Not Indicated        Care Coordination Interventions:  Yes, provided   Follow up plan: No further intervention required.   Encounter Outcome:  Patient Visit Completed   Kemper Durie RN, MSN, CCM [place]  [facility], [facility] RN Care Coordinator Direct Dial: [phone] / Main [phone] Fax [phone] Email: [email or web address]: [email or web address]

## 2023-06-02 DIAGNOSIS — Z510 Encounter for antineoplastic radiation therapy: Secondary | ICD-10-CM | POA: Diagnosis not present

## 2023-06-02 DIAGNOSIS — Z79899 Other long term (current) drug therapy: Secondary | ICD-10-CM | POA: Diagnosis not present

## 2023-06-02 DIAGNOSIS — C158 Malignant neoplasm of overlapping sites of esophagus: Secondary | ICD-10-CM | POA: Diagnosis not present

## 2023-06-02 DIAGNOSIS — E279 Disorder of adrenal gland, unspecified: Secondary | ICD-10-CM | POA: Insufficient documentation

## 2023-06-02 DIAGNOSIS — C787 Secondary malignant neoplasm of liver and intrahepatic bile duct: Secondary | ICD-10-CM | POA: Diagnosis not present

## 2023-06-02 DIAGNOSIS — R0789 Other chest pain: Secondary | ICD-10-CM | POA: Insufficient documentation

## 2023-06-02 DIAGNOSIS — C155 Malignant neoplasm of lower third of esophagus: Secondary | ICD-10-CM | POA: Diagnosis not present

## 2023-06-02 DIAGNOSIS — C7951 Secondary malignant neoplasm of bone: Secondary | ICD-10-CM | POA: Diagnosis not present

## 2023-06-03 ENCOUNTER — Encounter: Payer: Self-pay | Admitting: Oncology

## 2023-06-03 DIAGNOSIS — M62830 Muscle spasm of back: Secondary | ICD-10-CM | POA: Diagnosis not present

## 2023-06-03 DIAGNOSIS — M9901 Segmental and somatic dysfunction of cervical region: Secondary | ICD-10-CM | POA: Diagnosis not present

## 2023-06-03 DIAGNOSIS — M9903 Segmental and somatic dysfunction of lumbar region: Secondary | ICD-10-CM | POA: Diagnosis not present

## 2023-06-03 DIAGNOSIS — M9902 Segmental and somatic dysfunction of thoracic region: Secondary | ICD-10-CM | POA: Diagnosis not present

## 2023-06-04 ENCOUNTER — Ambulatory Visit: Admission: RE | Admit: 2023-06-04 | Payer: No Typology Code available for payment source | Source: Ambulatory Visit

## 2023-06-04 ENCOUNTER — Ambulatory Visit
Admission: RE | Admit: 2023-06-04 | Discharge: 2023-06-04 | Disposition: A | Discharge status: Home / Self Care | Payer: No Typology Code available for payment source | Source: Ambulatory Visit | Attending: Radiation Oncology | Admitting: Radiation Oncology

## 2023-06-04 DIAGNOSIS — C155 Malignant neoplasm of lower third of esophagus: Secondary | ICD-10-CM | POA: Insufficient documentation

## 2023-06-04 DIAGNOSIS — G9389 Other specified disorders of brain: Secondary | ICD-10-CM | POA: Diagnosis not present

## 2023-06-04 MED ORDER — GADOBUTROL 1 MMOL/ML IV SOLN
9.0000 mL | Freq: Once | INTRAVENOUS | Status: AC | PRN
  Administered 2023-06-04: 9 mL via INTRAVENOUS

## 2023-06-05 ENCOUNTER — Ambulatory Visit
Admission: RE | Admit: 2023-06-05 | Discharge: 2023-06-05 | Disposition: A | Discharge status: Home / Self Care | Payer: No Typology Code available for payment source | Source: Ambulatory Visit | Attending: Radiation Oncology | Admitting: Radiation Oncology

## 2023-06-05 ENCOUNTER — Inpatient Hospital Stay (HOSPITAL_BASED_OUTPATIENT_CLINIC_OR_DEPARTMENT_OTHER): Payer: No Typology Code available for payment source | Admitting: Oncology

## 2023-06-05 ENCOUNTER — Encounter: Payer: Self-pay | Admitting: Oncology

## 2023-06-05 ENCOUNTER — Inpatient Hospital Stay: Payer: No Typology Code available for payment source

## 2023-06-05 ENCOUNTER — Other Ambulatory Visit: Payer: Self-pay

## 2023-06-05 ENCOUNTER — Inpatient Hospital Stay: Payer: No Typology Code available for payment source | Attending: Oncology

## 2023-06-05 ENCOUNTER — Telehealth: Payer: Self-pay | Admitting: *Deleted

## 2023-06-05 VITALS — BP 124/89 | HR 109 | Temp 97.3°F | Resp 18 | Wt 198.8 lb

## 2023-06-05 DIAGNOSIS — J8410 Pulmonary fibrosis, unspecified: Secondary | ICD-10-CM | POA: Diagnosis not present

## 2023-06-05 DIAGNOSIS — Z5111 Encounter for antineoplastic chemotherapy: Secondary | ICD-10-CM | POA: Insufficient documentation

## 2023-06-05 DIAGNOSIS — C7972 Secondary malignant neoplasm of left adrenal gland: Secondary | ICD-10-CM | POA: Insufficient documentation

## 2023-06-05 DIAGNOSIS — Z79899 Other long term (current) drug therapy: Secondary | ICD-10-CM | POA: Insufficient documentation

## 2023-06-05 DIAGNOSIS — G620 Drug-induced polyneuropathy: Secondary | ICD-10-CM | POA: Insufficient documentation

## 2023-06-05 DIAGNOSIS — I700 Atherosclerosis of aorta: Secondary | ICD-10-CM | POA: Diagnosis not present

## 2023-06-05 DIAGNOSIS — C155 Malignant neoplasm of lower third of esophagus: Secondary | ICD-10-CM | POA: Insufficient documentation

## 2023-06-05 DIAGNOSIS — R0981 Nasal congestion: Secondary | ICD-10-CM | POA: Insufficient documentation

## 2023-06-05 DIAGNOSIS — E876 Hypokalemia: Secondary | ICD-10-CM | POA: Insufficient documentation

## 2023-06-05 DIAGNOSIS — K2091 Esophagitis, unspecified with bleeding: Secondary | ICD-10-CM | POA: Insufficient documentation

## 2023-06-05 DIAGNOSIS — Z5112 Encounter for antineoplastic immunotherapy: Secondary | ICD-10-CM | POA: Diagnosis not present

## 2023-06-05 DIAGNOSIS — Z7952 Long term (current) use of systemic steroids: Secondary | ICD-10-CM | POA: Insufficient documentation

## 2023-06-05 DIAGNOSIS — R0602 Shortness of breath: Secondary | ICD-10-CM | POA: Diagnosis not present

## 2023-06-05 DIAGNOSIS — R319 Hematuria, unspecified: Secondary | ICD-10-CM | POA: Insufficient documentation

## 2023-06-05 DIAGNOSIS — K760 Fatty (change of) liver, not elsewhere classified: Secondary | ICD-10-CM | POA: Diagnosis not present

## 2023-06-05 DIAGNOSIS — T451X5A Adverse effect of antineoplastic and immunosuppressive drugs, initial encounter: Secondary | ICD-10-CM

## 2023-06-05 DIAGNOSIS — Z79633 Long term (current) use of mitotic inhibitor: Secondary | ICD-10-CM | POA: Insufficient documentation

## 2023-06-05 DIAGNOSIS — C7931 Secondary malignant neoplasm of brain: Secondary | ICD-10-CM | POA: Diagnosis not present

## 2023-06-05 DIAGNOSIS — J984 Other disorders of lung: Secondary | ICD-10-CM | POA: Diagnosis not present

## 2023-06-05 DIAGNOSIS — K8020 Calculus of gallbladder without cholecystitis without obstruction: Secondary | ICD-10-CM | POA: Diagnosis not present

## 2023-06-05 DIAGNOSIS — C158 Malignant neoplasm of overlapping sites of esophagus: Secondary | ICD-10-CM | POA: Diagnosis not present

## 2023-06-05 DIAGNOSIS — I2510 Atherosclerotic heart disease of native coronary artery without angina pectoris: Secondary | ICD-10-CM | POA: Diagnosis not present

## 2023-06-05 DIAGNOSIS — Z8249 Family history of ischemic heart disease and other diseases of the circulatory system: Secondary | ICD-10-CM | POA: Insufficient documentation

## 2023-06-05 DIAGNOSIS — C159 Malignant neoplasm of esophagus, unspecified: Secondary | ICD-10-CM | POA: Diagnosis not present

## 2023-06-05 DIAGNOSIS — K224 Dyskinesia of esophagus: Secondary | ICD-10-CM | POA: Insufficient documentation

## 2023-06-05 DIAGNOSIS — R059 Cough, unspecified: Secondary | ICD-10-CM | POA: Insufficient documentation

## 2023-06-05 DIAGNOSIS — C7971 Secondary malignant neoplasm of right adrenal gland: Secondary | ICD-10-CM | POA: Diagnosis not present

## 2023-06-05 DIAGNOSIS — D62 Acute posthemorrhagic anemia: Secondary | ICD-10-CM | POA: Insufficient documentation

## 2023-06-05 DIAGNOSIS — C7951 Secondary malignant neoplasm of bone: Secondary | ICD-10-CM | POA: Diagnosis not present

## 2023-06-05 DIAGNOSIS — R000 Tachycardia, unspecified: Secondary | ICD-10-CM | POA: Insufficient documentation

## 2023-06-05 DIAGNOSIS — Z79890 Hormone replacement therapy (postmenopausal): Secondary | ICD-10-CM | POA: Insufficient documentation

## 2023-06-05 DIAGNOSIS — Z510 Encounter for antineoplastic radiation therapy: Secondary | ICD-10-CM | POA: Diagnosis not present

## 2023-06-05 DIAGNOSIS — Z803 Family history of malignant neoplasm of breast: Secondary | ICD-10-CM | POA: Insufficient documentation

## 2023-06-05 DIAGNOSIS — N200 Calculus of kidney: Secondary | ICD-10-CM | POA: Insufficient documentation

## 2023-06-05 DIAGNOSIS — Z515 Encounter for palliative care: Secondary | ICD-10-CM | POA: Insufficient documentation

## 2023-06-05 DIAGNOSIS — C787 Secondary malignant neoplasm of liver and intrahepatic bile duct: Secondary | ICD-10-CM | POA: Insufficient documentation

## 2023-06-05 DIAGNOSIS — Z8042 Family history of malignant neoplasm of prostate: Secondary | ICD-10-CM | POA: Insufficient documentation

## 2023-06-05 DIAGNOSIS — M533 Sacrococcygeal disorders, not elsewhere classified: Secondary | ICD-10-CM | POA: Insufficient documentation

## 2023-06-05 DIAGNOSIS — Z86718 Personal history of other venous thrombosis and embolism: Secondary | ICD-10-CM | POA: Insufficient documentation

## 2023-06-05 DIAGNOSIS — R5383 Other fatigue: Secondary | ICD-10-CM | POA: Insufficient documentation

## 2023-06-05 DIAGNOSIS — D500 Iron deficiency anemia secondary to blood loss (chronic): Secondary | ICD-10-CM

## 2023-06-05 DIAGNOSIS — I82621 Acute embolism and thrombosis of deep veins of right upper extremity: Secondary | ICD-10-CM | POA: Diagnosis not present

## 2023-06-05 DIAGNOSIS — R160 Hepatomegaly, not elsewhere classified: Secondary | ICD-10-CM | POA: Insufficient documentation

## 2023-06-05 DIAGNOSIS — K922 Gastrointestinal hemorrhage, unspecified: Secondary | ICD-10-CM | POA: Diagnosis not present

## 2023-06-05 LAB — CMP (CANCER CENTER ONLY)
ALT: 21 U/L (ref 0–44)
AST: 24 U/L (ref 15–41)
Albumin: 3.6 g/dL (ref 3.5–5.0)
Alkaline Phosphatase: 63 U/L (ref 38–126)
Anion gap: 9 (ref 5–15)
BUN: 9 mg/dL (ref 8–23)
CO2: 23 mmol/L (ref 22–32)
Calcium: 9.5 mg/dL (ref 8.9–10.3)
Chloride: 107 mmol/L (ref 98–111)
Creatinine: 1.11 mg/dL (ref 0.61–1.24)
GFR, Estimated: >60 mL/min (ref >60)
Glucose, Bld: 94 mg/dL (ref 70–99)
Potassium: 3.8 mmol/L (ref 3.5–5.1)
Sodium: 139 mmol/L (ref 135–145)
Total Bilirubin: 0.5 mg/dL (ref <1.2)
Total Protein: 7.3 g/dL (ref 6.5–8.1)

## 2023-06-05 LAB — CBC WITH DIFFERENTIAL (CANCER CENTER ONLY)
Abs Immature Granulocytes: 0.02 10*3/uL (ref 0.00–0.07)
Basophils Absolute: 0.1 10*3/uL (ref 0.0–0.1)
Basophils Relative: 2 %
Eosinophils Absolute: 0 10*3/uL (ref 0.0–0.5)
Eosinophils Relative: 1 %
HCT: 36.3 % — ABNORMAL LOW (ref 39.0–52.0)
Hemoglobin: 11.1 g/dL — ABNORMAL LOW (ref 13.0–17.0)
Immature Granulocytes: 0 %
Lymphocytes Relative: 29 %
Lymphs Abs: 1.5 10*3/uL (ref 0.7–4.0)
MCH: 27.4 pg (ref 26.0–34.0)
MCHC: 30.6 g/dL (ref 30.0–36.0)
MCV: 89.6 fL (ref 80.0–100.0)
Monocytes Absolute: 0.9 10*3/uL (ref 0.1–1.0)
Monocytes Relative: 18 %
Neutro Abs: 2.5 10*3/uL (ref 1.7–7.7)
Neutrophils Relative %: 50 %
Platelet Count: 274 10*3/uL (ref 150–400)
RBC: 4.05 MIL/uL — ABNORMAL LOW (ref 4.22–5.81)
RDW: 19.7 % — ABNORMAL HIGH (ref 11.5–15.5)
WBC Count: 5.1 10*3/uL (ref 4.0–10.5)
nRBC: 0 % (ref 0.0–0.2)

## 2023-06-05 LAB — RAD ONC ARIA SESSION SUMMARY
Course Elapsed Days: 0
Plan Fractions Treated to Date: 1
Plan Prescribed Dose Per Fraction: 2 Gy
Plan Total Fractions Prescribed: 15
Plan Total Prescribed Dose: 30 Gy
Reference Point Dosage Given to Date: 2 Gy
Reference Point Session Dosage Given: 2 Gy
Session Number: 1

## 2023-06-05 LAB — PROTEIN, URINE, RANDOM: Total Protein, Urine: 66 mg/dL

## 2023-06-05 LAB — TSH: TSH: 9.817 u[IU]/mL — ABNORMAL HIGH (ref 0.350–4.500)

## 2023-06-05 MED ORDER — SODIUM CHLORIDE 0.9 % IV SOLN
Freq: Once | INTRAVENOUS | Status: AC
  Filled 2023-06-05: qty 250

## 2023-06-05 MED ORDER — PREDNISONE 10 MG PO TABS: 10.0000 mg | ORAL_TABLET | Freq: Every day | ORAL | 1 refills | Status: DC

## 2023-06-05 MED ORDER — POTASSIUM CHLORIDE CRYS ER 20 MEQ PO TBCR: 40.0000 meq | EXTENDED_RELEASE_TABLET | Freq: Two times a day (BID) | ORAL | 1 refills | Status: DC

## 2023-06-05 MED ORDER — DIPHENHYDRAMINE HCL 50 MG/ML IJ SOLN
50.0000 mg | Freq: Once | INTRAMUSCULAR | Status: AC
Start: 2023-06-05 — End: 2023-06-05
  Administered 2023-06-05: 50 mg via INTRAVENOUS
  Filled 2023-06-05: qty 1

## 2023-06-05 MED ORDER — SODIUM CHLORIDE 0.9 % IV SOLN
80.0000 mg/m2 | Freq: Once | INTRAVENOUS | Status: AC
  Administered 2023-06-05: 174 mg via INTRAVENOUS
  Filled 2023-06-05: qty 29

## 2023-06-05 MED ORDER — HEPARIN SOD (PORK) LOCK FLUSH 100 UNIT/ML IV SOLN
500.0000 [IU] | Freq: Once | INTRAVENOUS | Status: AC | PRN
  Administered 2023-06-05: 500 [IU]
  Filled 2023-06-05: qty 5

## 2023-06-05 MED ORDER — RAMUCIRUMAB CHEMO INJECTION 500 MG/50ML
8.0000 mg/kg | Freq: Once | INTRAVENOUS | Status: AC
  Administered 2023-06-05: 800 mg via INTRAVENOUS
  Filled 2023-06-05: qty 30

## 2023-06-05 MED ORDER — FAMOTIDINE IN NACL 20-0.9 MG/50ML-% IV SOLN
20.0000 mg | Freq: Once | INTRAVENOUS | Status: AC
  Administered 2023-06-05: 20 mg via INTRAVENOUS
  Filled 2023-06-05: qty 50

## 2023-06-05 MED ORDER — DEXAMETHASONE SODIUM PHOSPHATE 10 MG/ML IJ SOLN
10.0000 mg | Freq: Once | INTRAMUSCULAR | Status: AC
  Administered 2023-06-05: 10 mg via INTRAVENOUS
  Filled 2023-06-05: qty 1

## 2023-06-05 MED ORDER — SCOPOLAMINE 1 MG/3DAYS TD PT72: 1.0000 | MEDICATED_PATCH | TRANSDERMAL | 0 refills | Status: DC

## 2023-06-05 NOTE — Assessment & Plan Note (Addendum)
Lab Results  Component Value Date   HGB 11.1 (L) 06/05/2023   TIBC 353 04/25/2023   IRONPCTSAT 35 04/25/2023   FERRITIN 357 (H) 04/25/2023  Ferritin maybe falsely elevated . Anemia is likely due to chemotherapy and possible iron deficiency.  Off iron supplementation Hb has improved. Retic panel is improved.

## 2023-06-05 NOTE — Assessment & Plan Note (Addendum)
Stage IV esophageal adenocarcinoma, liver metastatic disease, HER 2 negative. KRAS G12V, TMB 5.3, MSI Stable.  CPS 65%  1st line  FOLFOX , palliative RT --> 12/2022 CT progression--> switch to 5-FU + Nivolumab Q2 weeks  --> 02/2023 PET progression- pneumonitis--> 03/27/23  3rd line  Taxol and Cyramza --> 10/4 CT during admission showed slight decrease of liver lesion size. --> 10/30 Radonc obtained PET -liver lesion 10cm, bone mets. ? Progression.  Labs are reviewed and discussed with patient. Tolerates chemotherapy well.  Proceed with C3 D1 Taxol and Cytamza, 1 week lab Taxol  Refer to [facility] for expert opinion.  Follow up with palliative care  GI bleeding due to esophageal cancer, Dr. Rushie Chestnut will offer palliative RT

## 2023-06-05 NOTE — Telephone Encounter (Signed)
Patient called reporting that [facility] is NOT in his insurance network and is asking if there is somewhere else that would be in  network for him to be referred to for Clinical trial or second opinion

## 2023-06-05 NOTE — Assessment & Plan Note (Signed)
Off Eliquis 2.5mg  BID due to GI bleeding

## 2023-06-05 NOTE — Assessment & Plan Note (Signed)
Chemotherapy plan as listed above 

## 2023-06-05 NOTE — Assessment & Plan Note (Signed)
Chronic hypokalemia Cotinue KCL to BID. Marland Kitchen

## 2023-06-05 NOTE — Progress Notes (Signed)
Pt here for follow up. Pt states that he was suppose to start radiation today. He will go back to radiation to confirm if he will still get today.

## 2023-06-05 NOTE — Assessment & Plan Note (Signed)
Grade 2, he prefers to hold of neuropathy treatments.  observation.

## 2023-06-05 NOTE — Telephone Encounter (Signed)
Referral faxed to Dr. Meryl Dare at [facility]   Ph: [phone] Fax: [phone]

## 2023-06-05 NOTE — Assessment & Plan Note (Addendum)
Due to radiation/immunotherapy.  Prednisone tapering course was inturrupted during his admission due to GI bleeding.  continue prednisone 10mg  daily due to recurrence of SOB/cough.  Mucus production, trials of scopolamine patch. Discussed side effects.

## 2023-06-05 NOTE — Progress Notes (Signed)
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 KRAS G12V, TMB 5.3, MSI Stable.  CPS 65%  1st line  FOLFOX , palliative RT --> 12/2022 CT progression--> switch to 5-FU + Nivolumab Q2 weeks  --> 02/2023 PET progression- pneumonitis--> 03/27/23  3rd line  Taxol and Cyramza --> 10/4 CT during admission showed slight decrease of liver lesion size. --> 10/30 Radonc obtained PET -liver lesion 10cm, bone mets. ? Progression.  Labs are reviewed and discussed with patient. Tolerates chemotherapy well.  Proceed with C3 D1 Taxol and Cytamza, 1 week lab Taxol  Refer to [facility] for expert opinion.    GI bleeding due to esophageal cancer, Dr. Rushie Chestnut will offer palliative RT  Encounter for antineoplastic chemotherapy Chemotherapy plan as listed above.   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  Prednisone tapering course was inturrupted during his admission due to GI bleeding.  continue prednisone 10mg  daily due to recurrence of SOB/cough.  Mucus production, trials of scopolamine patch. Discussed side effects.    Hypokalemia Chronic hypokalemia Cotinue KCL to BID. Marland Kitchen      Deep venous thrombosis (HCC) Off Eliquis 2.5mg  BID due to GI bleeding  Chemotherapy-induced neuropathy (HCC) Grade 2, he prefers to hold of neuropathy treatments.  observation.   IDA (iron deficiency anemia) Lab Results  Component Value Date   HGB 11.1 (L) 06/05/2023   TIBC 353 04/25/2023   IRONPCTSAT 35 04/25/2023    FERRITIN 357 (H) 04/25/2023  Ferritin maybe falsely elevated . Anemia is likely due to chemotherapy and possible iron deficiency.  Off iron supplementation Hb has improved. Retic panel is improved.   Orders Placed This Encounter  Procedures   Retic Panel    Standing Status:   Future    Standing Expiration Date:   06/11/2024   Ambulatory Referral to Palliative Care    Referral Priority:   Routine    Referral Type:   Consultation    Referral Reason:   Advance Care Planning    Number of Visits Requested:   1   Ambulatory referral to Hematology / Oncology    Referral Priority:   Routine    Referral Type:   Consultation    Referral Reason:   Specialty Services Required    Requested Specialty:   Oncology    Number of Visits Requested:   1    Follow-up LOS  All questions were answered. The patient knows to call the clinic with any problems, questions or concerns.  Rickard Patience, MD, PhD [facility] Hematology Oncology 06/05/2023   HISTORY OF PRESENTING ILLNESS:  Jonathan [name] 67 y.o. male presents to establish care for esophageal adenocarcinoma I have reviewed his chart and materials related to his cancer extensively and collaborated history with the patient. Summary of oncologic history is as follows: Oncology History  Adenocarcinoma of esophagus (HCC)  08/28/2022 Initial Diagnosis   Adenocarcinoma of esophagus   -Patient has noticed worsening of "food stuck/fullness" sensation since November 2023.  Patient had a barium swallow study which commented on marked mucosal irregularity in the distal esophagus with Broaddus  base mural filling defect highly suspicious for malignancy.  Patient establish care with gastroenterology. -08/23/2022, EGD showed gastritis and partially obstructing malignant esophageal tumor in the lower third of the esophagus. Esophagus mass biopsy showed adenocarcinoma.  PD-L1 TPS 65%, HER2 negative.  [facility] NGS showed KRASG12V, CDKN2A, ARID1A, TP53, TMB 5.3, MSI  stable.  Stomach biopsy showed gastric mucosa with no specific histology abnormality.  No significant intestinal metaplastic, dysplastic, granular atrophy or increased inflammation.     08/28/2022 Imaging   CT chest abdomen pelvis with contrast showed 1. Distal esophageal primary with gastrohepatic ligament nodal metastasis. 2. 2 right-sided pulmonary nodules, the largest of which measures 5 mm and is new since 2015. Pulmonary metastasis not be excluded. 3. Anterior right lower lobe volume loss and minimal soft tissue density, favoring atelectasis or scar. Recommend attention on follow-up. 4. Hepatic steatosis 5. Cholelithiasis 6. Left nephrolithiasis 7. Coronary artery atherosclerosis. Aortic Atherosclerosis   08/28/2022 Cancer Staging   Staging form: Esophagus - Adenocarcinoma, AJCC 8th Edition - Clinical stage from 08/28/2022: Stage IVB (cT3, cN1, cM1) - Signed by Rickard Patience, MD on 10/04/2022 Stage prefix: Initial diagnosis   09/06/2022 Imaging   PET scan showed 1. Esophageal primary with gastrohepatic ligament nodal metastasis,as on CT. 2. Focus of hypermetabolism which is favored to registered to the posterior hepatic dome, in the region of subtle heterogeneity on prior diagnostic CT. Suboptimally evaluated secondary to underlying steatosis. Recommend further evaluation with pre and post contrast abdominal MRI to confirm probable metastasis. 3. Incidental findings, including: Left nephrolithiasis.Cholelithiasis. Coronary artery atherosclerosis. Aortic Atherosclerosis    09/14/2022 - 09/14/2022 Chemotherapy   Patient is on Treatment Plan : ESOPHAGUS Carboplatin + Paclitaxel Weekly X 6 Weeks with XRT     09/23/2022 Imaging   MRI abdomen with and without contrast showed 1. Mildly T2 hyperintense segment VII hepatic lesion measuring 2.7 cm with imaging characteristics compatible with metastatic disease. 2. Tiny focus of delayed enhancement in the inferior right lobe of the liver segment VI  measuring 8 mm with ill-defined increased T2 signal and subtle corresponding reduced diffusivity, also suspicious for metastatic disease. 3. Partially visualized distal esophageal wall thickening compatible with the patient's known primary neoplasm. 4. Similar size of the 11 mm gastrohepatic ligament lymph node mildly metabolic on prior PET-CT and compatible with local nodal disease involvement. 5. 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 Recommend follow up pre and post-contrast MRI/MRCP in 1 year. 6. Diffuse hepatic steatosis.   10/10/2022 Procedure   LIVER MASS; CT-GUIDED BIOPSY:  - MODERATE TO POORLY DIFFERENTIATED ADENOCARCINOMA MORPHOLOGICALLY  CONSISTENT WITH METASTASIS FROM PATIENT'S KNOWN ESOPHAGEAL  ADENOCARCINOMA.    10/17/2022 Procedure   Medi port placed by Dr. Wyn Quaker   10/21/2022 - 01/15/2023 Chemotherapy   Patient is on Treatment Plan : ESOPHAGEAL ADENOCARCINOMA FOLFOX q14d x 6 cycles     01/15/2023 Imaging   PET showed  1. Interval progression in metastatic esophageal carcinoma as evidenced by hypermetabolic lymph nodes in the neck, chest, abdomen and pelvis, an enlarging right hepatic lobe metastasis, new bilateral adrenal metastases and new osseous metastases. 2. Cholelithiasis. 3. Left renal stones. 4. Aortic atherosclerosis (ICD10-I70.0). Coronary artery calcification.    01/27/2023 - 03/14/2023 Chemotherapy   Patient is on Treatment Plan : GASTROESOPHAGEAL FOLFOX + Nivolumab q14d     03/20/2023 Imaging   CT chest abdomen pelvis w contrast showed 1. Interval progression of metastatic disease with multiple new and enlarged  hypodense lesions throughout the liver. 2. Significant enlargement of bilateral adrenal metastases. 3. Subtle sclerosis of an osseous metastasis of the right femoral neck. Other previously FDG avid osseous metastases of the left femoral neck and right sixth rib not  appreciated by CT. 4. No persistently enlarged lymph nodes. 5. Interval development of bandlike consolidation and fibrosis of the paramedian right lung, particularly of the right lower lobe, as well as additional scattered irregular opacities throughout the right lung. 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 6. Similar circumferential wall thickening throughout the mid to lower esophagus, consistent with known primary esophageal adenocarcinoma. 7. Nonobstructive bilateral nephrolithiasis. Aortic Atherosclerosis   03/27/2023 -  Chemotherapy   Patient is on Treatment Plan : GASTROESOPHAGEAL Ramucirumab D1, 15 + Paclitaxel D1,8,15 q28d     05/02/2023 - 05/08/2023 Hospital Admission   Admission due to coffee-ground emesis. EGD 10/7 showed bleeding from esophageal cancer, esophagitis due to chemo and radiation. Applied Hemospray. Eliquis was stopped. Acute blood loss anemia, received IV venofer treatments x 3 doses    Patient presents to establish care.  He is not taking PPI. He has intentionally lost some weight. Family history positive for father and paternal uncle with prostate cancer and sister with breast cancer. Denies any routine alcohol use  Right interval jugular vein occlusive DVT, started on Eliquis starter package on 10/28/2022, swelling of neck has improved.   CXR showed Interval development of bilateral perihilar interstitial opacities  He was treated with Azithromycin and Doxycycline to cover atypical infections.   INTERVAL HISTORY Jonathan [name] is a 67 y.o. male who has above history reviewed by me today presents for follow up visit for Stage IV esophageal adenocarcinoma.  No additionally hematemesis episode.  Prednisone 10mg  resumed and his cough/sob symptoms are slightly better.  He still has a lot of mucus production. Post nasal drip which also triggers cough.  He takes PPI.  He denies any SOB, abdominal pain.  + neuropathy of  finger tips, toes/bottom of feet. "Rubbery"    MEDICAL HISTORY:  Past Medical History:  Diagnosis Date   Arthritis    Complication of anesthesia    OCCURRED ONCE YEARS AGO 1981   Esophageal mass    Hypertension    Hypothyroidism    PONV (postoperative nausea and vomiting)     SURGICAL HISTORY: Past Surgical History:  Procedure Laterality Date   COLONOSCOPY     ESOPHAGOGASTRODUODENOSCOPY N/A 08/23/2022   Procedure: ESOPHAGOGASTRODUODENOSCOPY (EGD);  Surgeon: Jaynie Collins, DO;  Location[facility] ENDOSCOPY;  Service: Gastroenterology;  Laterality: N/A;   ESOPHAGOGASTRODUODENOSCOPY (EGD) WITH PROPOFOL N/A 05/05/2023   Procedure: ESOPHAGOGASTRODUODENOSCOPY (EGD) WITH PROPOFOL;  Surgeon: Jaynie Collins, DO;  Location: [facility] ENDOSCOPY;  Service: Gastroenterology;  Laterality: N/A;   EUS N/A 09/12/2022   Procedure: FULL UPPER ENDOSCOPIC ULTRASOUND (EUS) RADIAL;  Surgeon: Bearl Mulberry, MD;  Location: [facility] ENDOSCOPY;  Service: Gastroenterology;  Laterality: N/A;   FINGER SURGERY     HEMOSTASIS CONTROL  05/05/2023   Procedure: HEMOSTASIS CONTROL;  Surgeon: Jaynie Collins, DO;  Location: [facility] ENDOSCOPY;  Service: Gastroenterology;;   PORTA CATH INSERTION N/A 10/17/2022   Procedure: PORTA CATH INSERTION;  Surgeon: Annice Needy, MD;  Location: [facility] INVASIVE CV LAB;  Service: Cardiovascular;  Laterality: N/A;   TENDON REPAIR IN LEFT KNEE      SOCIAL HISTORY: Social History   Socioeconomic History   Marital status: Single    Spouse name: Not on file   Number of  children: Not on file   Years of education: Not on file   Highest education level: Not on file  Occupational History   Not on file  Tobacco Use   Smoking status: Never   Smokeless tobacco: Never  Vaping Use   Vaping status: Never Used  Substance and Sexual Activity   Alcohol use: Yes    Comment: OCCASIONALLY   Drug use: Never   Sexual activity: Not on file  Other Topics Concern   Not on file   Social History Narrative   Not on file   Social Determinants of Health   Financial Resource Strain: Low Risk  (10/29/2022)   [facility] from [facility]   Overall Financial Resource Strain (CARDIA)    Difficulty of Paying Living Expenses: Not hard at all  Food Insecurity: No Food Insecurity (05/30/2023)   Hunger Vital Sign    Worried About Running Out of Food in the Last Year: Never true    Ran Out of Food in the Last Year: Never true  Transportation Needs: No Transportation Needs (05/30/2023)   PRAPARE - Administrator, Civil Service (Medical): No    Lack of Transportation (Non-Medical): No  Physical Activity: Not on file  Stress: Not on file  Social Connections: Not on file  Intimate Partner Violence: Not At Risk (05/30/2023)   Humiliation, Afraid, Rape, and Kick questionnaire    Fear of Current or Ex-Partner: No    Emotionally Abused: No    Physically Abused: No    Sexually Abused: No    FAMILY HISTORY: Family History  Problem Relation Age of Onset   Heart attack Mother    Prostate cancer Father    Breast cancer Sister    Prostate cancer Paternal Uncle     ALLERGIES:  has No Known Allergies.  MEDICATIONS:  Current Outpatient Medications  Medication Sig Dispense Refill   acetaminophen (TYLENOL) 650 MG CR tablet Take 1,300 mg by mouth every 8 (eight) hours as needed for pain.     azelastine (ASTELIN) 0.1 % nasal spray Place 1 spray into both nostrils 2 (two) times daily. Use in each nostril as directed 30 mL 1   benzonatate (TESSALON) 200 MG capsule Take 1 capsule (200 mg total) by mouth 3 (three) times daily as needed for cough. 20 capsule 5   chlorhexidine (PERIDEX) 0.12 % solution USE AS DIRECTED TAKE  15  ML  IN  THE  MOUTH  OR  THROAT  TWICE  DAILY 473 mL 0   Cholecalciferol (VITAMIN D3) 125 MCG (5000 UT) CAPS Take 5,000 Units by mouth daily.     levothyroxine (SYNTHROID) 125 MCG tablet Take 125 mcg by mouth  every morning.     Multiple Vitamins-Minerals (MULTIVITAMIN WITH MINERALS) tablet Take 1 tablet by mouth daily.     ondansetron (ZOFRAN) 8 MG tablet Take 1 tablet (8 mg total) by mouth every 8 (eight) hours as needed for nausea or vomiting. Start on the third day after chemotherapy.     pantoprazole (PROTONIX) 40 MG tablet Take 1 tablet (40 mg total) by mouth 2 (two) times daily. 60 tablet 2   prochlorperazine (COMPAZINE) 10 MG tablet Take 1 tablet (10 mg total) by mouth every 6 (six) hours as needed for nausea or vomiting.     scopolamine (TRANSDERM-SCOP) 1 MG/3DAYS Place 1 patch (1.5 mg total) onto the skin every 3 (three) days. 10 patch 0   potassium chloride SA (KLOR-CON M) 20  MEQ tablet Take 2 tablets (40 mEq total) by mouth 2 (two) times daily. 60 tablet 1   predniSONE (DELTASONE) 10 MG tablet Take 1 tablet (10 mg total) by mouth daily with breakfast. 30 tablet 1   No current facility-administered medications for this visit.   Facility-Administered Medications Ordered in Other Visits  Medication Dose Route Frequency Provider Last Rate Last Admin   PACLitaxel (TAXOL) 174 mg in sodium chloride 0.9 % 250 mL chemo infusion (</= 80mg /m2)  80 mg/m2 (Treatment Plan Recorded) Intravenous Once Rickard Patience, MD       ramucirumab [facility]) 800 mg in sodium chloride 0.9 % 170 mL chemo infusion  8 mg/kg (Treatment Plan Recorded) Intravenous Once Rickard Patience, MD        Review of Systems  Constitutional:  Negative for appetite change, chills, fatigue and fever.  HENT:   Negative for hearing loss and voice change.        Nasal congestion, postnasal drip.   Eyes:  Negative for eye problems and icterus.  Respiratory:  Negative for chest tightness, cough and shortness of breath.   Cardiovascular:  Negative for chest pain and leg swelling.  Gastrointestinal:  Negative for abdominal distention, abdominal pain, nausea and vomiting.  Endocrine: Negative for hot flashes.  Genitourinary:  Negative for difficulty  urinating, dysuria and frequency.   Musculoskeletal:  Negative for arthralgias.  Skin:  Negative for itching and rash.  Neurological:  Positive for numbness. Negative for light-headedness.  Hematological:  Negative for adenopathy. Does not bruise/bleed easily.  Psychiatric/Behavioral:  Negative for confusion.      PHYSICAL EXAMINATION: ECOG PERFORMANCE STATUS: 0 - Asymptomatic  Vitals:   06/05/23 0843 06/05/23 0851  BP: (!) 132/99 124/89  Pulse: (!) 109   Resp: 18   Temp: (!) 97.3 F (36.3 C)    Filed Weights   06/05/23 0843  Weight: 198 lb 12.8 oz (90.2 kg)    Physical Exam Constitutional:      General: He is not in acute distress.    Appearance: He is obese. He is not diaphoretic.  HENT:     Head: Normocephalic and atraumatic.  Eyes:     General: No scleral icterus. Cardiovascular:     Rate and Rhythm: Normal rate and regular rhythm.  Pulmonary:     Effort: Pulmonary effort is normal. No respiratory distress.     Breath sounds: No wheezing.  Abdominal:     General: There is no distension.     Palpations: Abdomen is soft.  Musculoskeletal:        General: Normal range of motion.     Cervical back: Normal range of motion and neck supple.  Skin:    General: Skin is warm and dry.     Findings: No erythema.  Neurological:     Mental Status: He is alert and oriented to person, place, and time. Mental status is at baseline.     Cranial Nerves: No cranial nerve deficit.     Motor: No abnormal muscle tone.  Psychiatric:        Mood and Affect: Mood and affect normal.      LABORATORY DATA:  I have reviewed the data as listed    Latest Ref Rng & Units 06/05/2023    8:00 AM 05/22/2023    7:58 AM 05/15/2023    8:00 AM  CBC  WBC 4.0 - 10.5 K/uL 5.1  5.1  6.0   Hemoglobin 13.0 - 17.0 g/dL 56.2  9.8  8.8  Hematocrit 39.0 - 52.0 % 36.3  32.2  29.9   Platelets 150 - 400 K/uL 274  280  262       Latest Ref Rng & Units 06/05/2023    8:00 AM 05/22/2023    7:58 AM  05/15/2023    8:00 AM  CMP  Glucose 70 - 99 mg/dL 94  91  88   BUN 8 - 23 mg/dL 9  9  5    Creatinine 0.61 - 1.24 mg/dL 1.61  0.96  0.45   Sodium 135 - 145 mmol/L 139  138  138   Potassium 3.5 - 5.1 mmol/L 3.8  3.8  3.7   Chloride 98 - 111 mmol/L 107  106  109   CO2 22 - 32 mmol/L 23  22  23    Calcium 8.9 - 10.3 mg/dL 9.5  9.0  8.6   Total Protein 6.5 - 8.1 g/dL 7.3  7.2  6.7   Total Bilirubin <1.2 mg/dL 0.5  0.7  0.4   Alkaline Phos 38 - 126 U/L 63  56  49   AST 15 - 41 U/L 24  26  28    ALT 0 - 44 U/L 21  28  25       RADIOGRAPHIC STUDIES: I have personally reviewed the radiological images as listed and agreed with the findings in the report. MR Brain W Wo Contrast  Result Date: 06/04/2023 CLINICAL DATA:  Brain metastases suspected EXAM: MRI HEAD WITHOUT AND WITH CONTRAST TECHNIQUE: Multiplanar, multiecho pulse sequences of the brain and surrounding structures were obtained without and with intravenous contrast. CONTRAST:  9mL GADAVIST GADOBUTROL 1 MMOL/ML IV SOLN COMPARISON:  04/18/2021 MRI head IAC protocol, correlation is also made with 05/21/2023 PET-CT the FINDINGS: Brain: No restricted diffusion to suggest acute or subacute infarct. No abnormal parenchymal or meningeal enhancement. No acute hemorrhage, mass, mass effect, or midline shift. No hydrocephalus or extra-axial collection. Pituitary and craniocervical junction within normal limits. 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 Normal cerebral volume for age. Scattered T2 hyperintense signal in the periventricular white matter, likely the sequela of mild chronic small vessel ischemic disease. Vascular: Normal arterial flow voids. Normal arterial and venous enhancement. Skull and upper cervical spine: Normal marrow signal. Sinuses/Orbits: Clear paranasal sinuses. No acute finding in the orbits. Other: Fluid in  the left mastoid air cells. IMPRESSION: No acute intracranial process. No evidence of intracranial metastatic disease. Electronically Signed   By: Wiliam Ke M.D.   On: 06/04/2023 18:31   NM PET Image Initial (PI) Skull Base To Thigh (F-18 FDG)  Result Date: 05/28/2023 CLINICAL DATA:  Subsequent treatment strategy for metastatic esophageal cancer. EXAM: NUCLEAR MEDICINE PET SKULL BASE TO THIGH TECHNIQUE: 10.83 mCi F-18 FDG was injected intravenously. Full-ring PET imaging was performed from the skull base to thigh after the radiotracer. CT data was obtained and used for attenuation correction and anatomic localization. Fasting blood glucose: 79 mg/dl COMPARISON:  Multiple prior imaging studies. The most recent PET-CT is 01/08/2023 FINDINGS: Mediastinal blood pool activity: SUV max 2.24 Liver activity: SUV max NA NECK: No hypermetabolic lymph nodes in the neck. Possible hypermetabolic brain lesions. MRI suggested for further evaluation. Incidental CT findings: Mild symmetric hypermetabolism both thyroid lobes likely due to thyroiditis. CHEST: Progressive hypermetabolism in the distal esophagus with SUV max of 9.8 a. findings consistent with progressive disease. Chronic medial right lower lobe atelectasis likely  radiation change. Moderate hypermetabolism with SUV max 5.57 but no obvious tumor. No enlarged or hypermetabolic mediastinal hilar lymph nodes. No worrisome pulmonary nodules on the CT scan. Incidental CT findings: Stable age advanced vascular calcifications. ABDOMEN/PELVIS: Significant progression of metastatic hepatic disease. Large coalescence right hepatic lobe mass measuring approximately 10 cm with largely rim like hypermetabolism with SUV max of 12.44. Three small hypermetabolic lesions in the left hepatic lobe consistent with metastatic disease. The largest lesion measures 16 mm and SUV max is 6.96. There is also a small lesion in segment 6 with SUV max of 8.61. Enlarging right adrenal gland  mass measuring 3.9 x 3.1 cm (previous 2.5 x 2.3 cm). SUV max is 10.39 (previously 4.6). FDG uptake in the anorectal area with SUV max of 7.55, similar to prior study and likely physiologic. Internal hemorrhoids are also possible. No enlarged or hypermetabolic abdominal or pelvic lymph nodes. Incidental CT findings: Stable gallstones, renal calculi and vascular calcifications. SKELETON: New hypermetabolic bone lesions are noted in the T12 and L2 vertebral bodies. T12 lesion has an SUV max of 6.87 and L1 lesion has an SUV max of 10.47 vaguely lytic lesions seen on the CT scan. There is also a hypermetabolic lesion in the left sacrum with SUV max of 5.06. Small hypermetabolic right sixth posterior rib lesion has an SUV max of 6.04. Incidental CT findings: None. IMPRESSION: 1. Progressive hypermetabolism in the distal esophagus consistent with progressive disease. 2. Significant progression of metastatic hepatic disease (new and enlarging lesions). 3. Enlarging and progressively hypermetabolic right adrenal gland mass. 4. New hypermetabolic bone lesions in the T12 and L2 vertebral bodies, left sacrum and right sixth posterior rib consistent with metastatic disease. 5. Possible hypermetabolic brain lesions. MRI suggested for further evaluation. 6. Chronic medial right lower lobe atelectasis likely radiation change. Moderate hypermetabolism but no obvious tumor. 7. Stable gallstones, renal calculi and vascular calcifications. Electronically Signed   By: Rudie Meyer M.D.   On: 05/28/2023 14:43

## 2023-06-05 NOTE — Patient Instructions (Signed)
[facility] - A DEPT OF [facility]  Discharge Instructions: Thank you for choosing [facility] to provide your oncology and hematology care.  If you have a lab appointment with the Cancer Center, please go directly to the Cancer Center and check in at the registration area.  Wear comfortable clothing and clothing appropriate for easy access to any Portacath or PICC line.   We strive to give you quality time with your provider. You may need to reschedule your appointment if you arrive late (15 or more minutes).  Arriving late affects you and other patients whose appointments are after yours.  Also, if you miss three or more appointments without notifying the office, you may be dismissed from the clinic at the provider's discretion.      For prescription refill requests, have your pharmacy contact our office and allow 72 hours for refills to be completed.    To help prevent nausea and vomiting after your treatment, we encourage you to take your nausea medication as directed.  BELOW ARE SYMPTOMS THAT SHOULD BE REPORTED IMMEDIATELY: *FEVER GREATER THAN 100.4 F (38 C) OR HIGHER *CHILLS OR SWEATING *NAUSEA AND VOMITING THAT IS NOT CONTROLLED WITH YOUR NAUSEA MEDICATION *UNUSUAL SHORTNESS OF BREATH *UNUSUAL BRUISING OR BLEEDING *URINARY PROBLEMS (pain or burning when urinating, or frequent urination) *BOWEL PROBLEMS (unusual diarrhea, constipation, pain near the anus) TENDERNESS IN MOUTH AND THROAT WITH OR WITHOUT PRESENCE OF ULCERS (sore throat, sores in mouth, or a toothache) UNUSUAL RASH, SWELLING OR PAIN  UNUSUAL VAGINAL DISCHARGE OR ITCHING   Items with * indicate a potential emergency and should be followed up as soon as possible or go to the Emergency Department if any problems should occur.  Please show the CHEMOTHERAPY ALERT CARD or IMMUNOTHERAPY ALERT CARD at check-in to the Emergency Department and triage nurse.  Should you have  questions after your visit or need to cancel or reschedule your appointment, please contact [facility] - A DEPT OF [facility]  [phone] and follow the prompts.  Office hours are 8:00 a.m. to 4:30 p.m. Monday - Friday. Please note that voicemails left after 4:00 p.m. may not be returned until the following business day.  We are closed weekends and major holidays. You have access to a nurse at all times for urgent questions. Please call the main number to the clinic [phone] and follow the prompts.  For any non-urgent questions, you may also contact your provider using MyChart. We now offer e-Visits for anyone 57 and older to request care online for non-urgent symptoms. For details visit [email or web address].   Also download the MyChart app! Go to the app store, search "MyChart", open the app, select [place], and log in with your MyChart username and password.

## 2023-06-06 ENCOUNTER — Ambulatory Visit: Payer: No Typology Code available for payment source

## 2023-06-06 ENCOUNTER — Ambulatory Visit
Admission: RE | Admit: 2023-06-06 | Discharge: 2023-06-06 | Disposition: A | Discharge status: Home / Self Care | Payer: No Typology Code available for payment source | Source: Ambulatory Visit | Attending: Radiation Oncology | Admitting: Radiation Oncology

## 2023-06-06 ENCOUNTER — Other Ambulatory Visit: Payer: Self-pay

## 2023-06-06 ENCOUNTER — Encounter: Payer: Self-pay | Admitting: Oncology

## 2023-06-06 ENCOUNTER — Other Ambulatory Visit: Payer: No Typology Code available for payment source

## 2023-06-06 ENCOUNTER — Inpatient Hospital Stay: Payer: No Typology Code available for payment source

## 2023-06-06 ENCOUNTER — Inpatient Hospital Stay (HOSPITAL_BASED_OUTPATIENT_CLINIC_OR_DEPARTMENT_OTHER): Payer: No Typology Code available for payment source | Admitting: Hospice and Palliative Medicine

## 2023-06-06 DIAGNOSIS — C159 Malignant neoplasm of esophagus, unspecified: Secondary | ICD-10-CM | POA: Diagnosis not present

## 2023-06-06 DIAGNOSIS — Z510 Encounter for antineoplastic radiation therapy: Secondary | ICD-10-CM | POA: Diagnosis not present

## 2023-06-06 DIAGNOSIS — C155 Malignant neoplasm of lower third of esophagus: Secondary | ICD-10-CM | POA: Diagnosis not present

## 2023-06-06 DIAGNOSIS — Z515 Encounter for palliative care: Secondary | ICD-10-CM

## 2023-06-06 DIAGNOSIS — Z5112 Encounter for antineoplastic immunotherapy: Secondary | ICD-10-CM | POA: Diagnosis not present

## 2023-06-06 LAB — RAD ONC ARIA SESSION SUMMARY
Course Elapsed Days: 1
Plan Fractions Treated to Date: 2
Plan Prescribed Dose Per Fraction: 2 Gy
Plan Total Fractions Prescribed: 15
Plan Total Prescribed Dose: 30 Gy
Reference Point Dosage Given to Date: 4 Gy
Reference Point Session Dosage Given: 2 Gy
Session Number: 2

## 2023-06-06 LAB — CEA: CEA: 94.7 ng/mL — ABNORMAL HIGH (ref 0.0–4.7)

## 2023-06-06 LAB — T4: T4, Total: 8.3 ug/dL (ref 4.5–12.0)

## 2023-06-06 NOTE — Progress Notes (Signed)
Palliative Medicine [facility] at [facility] [phone] [phone]   Name: Jonathan [name] Date: 06/06/2023 MRN: [id number]  DOB: 06/09/1956  Patient Care Team: [name], MD as PCP - General (Family Medicine) [name], RN as Oncology Nurse Navigator Rickard Patience, MD as Consulting Physician (Oncology)    REASON FOR CONSULTATION: Jonathan [name] is a 67 y.o. 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  He was referred to palliative care to address goals.  SOCIAL HISTORY:     reports that he has never smoked. He has never used smokeless tobacco. He reports current alcohol use. He reports that he does not use drugs.  Patient unmarried has no children.  Lives at home alone.  ADVANCE DIRECTIVES:  Not on file  CODE STATUS:   PAST MEDICAL HISTORY: Past Medical History:  Diagnosis Date   Arthritis    Complication of anesthesia    OCCURRED ONCE YEARS AGO 1981   Esophageal mass    Hypertension    Hypothyroidism    PONV (postoperative nausea and vomiting)     PAST SURGICAL HISTORY:  Past Surgical History:  Procedure Laterality Date   COLONOSCOPY     ESOPHAGOGASTRODUODENOSCOPY N/A 08/23/2022   Procedure: ESOPHAGOGASTRODUODENOSCOPY (EGD);  Surgeon: Jaynie Collins, DO;  Location: [facility] ENDOSCOPY;  Service: Gastroenterology;  Laterality: N/A;   ESOPHAGOGASTRODUODENOSCOPY (EGD) WITH PROPOFOL N/A 05/05/2023   Procedure: ESOPHAGOGASTRODUODENOSCOPY (EGD) WITH PROPOFOL;  Surgeon: Jaynie Collins, DO;  Location: [facility] ENDOSCOPY;  Service: Gastroenterology;  Laterality: N/A;   EUS N/A 09/12/2022   Procedure: FULL UPPER ENDOSCOPIC ULTRASOUND (EUS) RADIAL;  Surgeon: Bearl Mulberry, MD;  Location: [facility] ENDOSCOPY;  Service: Gastroenterology;  Laterality: N/A;   FINGER SURGERY      HEMOSTASIS CONTROL  05/05/2023   Procedure: HEMOSTASIS CONTROL;  Surgeon: Jaynie Collins, DO;  Location: [facility] ENDOSCOPY;  Service: Gastroenterology;;   PORTA CATH INSERTION N/A 10/17/2022   Procedure: PORTA CATH INSERTION;  Surgeon: Annice Needy, MD;  Location: [facility] INVASIVE CV LAB;  Service: Cardiovascular;  Laterality: N/A;   TENDON REPAIR IN LEFT KNEE      HEMATOLOGY/ONCOLOGY HISTORY:  Oncology History  Adenocarcinoma of esophagus (HCC)  08/28/2022 Initial Diagnosis   Adenocarcinoma of esophagus   -Patient has noticed worsening of "food stuck/fullness" sensation since November 2023.  Patient had a barium swallow study which commented on marked mucosal irregularity in the distal esophagus with Broaddus base mural filling defect highly suspicious for malignancy.  Patient establish care with gastroenterology. -08/23/2022, EGD showed gastritis and partially obstructing malignant esophageal tumor in the lower third of the esophagus. Esophagus mass biopsy showed adenocarcinoma.  PD-L1 TPS 65%, HER2 negative.  [facility] NGS showed KRASG12V, CDKN2A, ARID1A, TP53, TMB 5.3, MSI stable.  Stomach biopsy showed gastric mucosa with no specific histology abnormality.  No significant intestinal metaplastic, dysplastic, granular atrophy or increased inflammation.     08/28/2022 Imaging   CT chest abdomen pelvis with contrast showed 1. Distal esophageal primary with gastrohepatic ligament nodal metastasis. 2. 2 right-sided pulmonary nodules, the largest of which measures 5 mm and is new since 2015. Pulmonary metastasis not be excluded. 3. Anterior right lower lobe volume loss and minimal soft tissue density, favoring atelectasis or scar. Recommend attention on follow-up. 4. Hepatic steatosis 5. Cholelithiasis 6. Left nephrolithiasis 7. Coronary artery atherosclerosis. Aortic Atherosclerosis   08/28/2022 Cancer Staging  Staging form: Esophagus - Adenocarcinoma, AJCC 8th Edition - Clinical stage  from 08/28/2022: Stage IVB (cT3, cN1, cM1) - Signed by Rickard Patience, MD on 10/04/2022 Stage prefix: Initial diagnosis   09/06/2022 Imaging   PET scan showed 1. Esophageal primary with gastrohepatic ligament nodal metastasis,as on CT. 2. Focus of hypermetabolism which is favored to registered to the posterior hepatic dome, in the region of subtle heterogeneity on prior diagnostic CT. Suboptimally evaluated secondary to underlying steatosis. Recommend further evaluation with pre and post contrast abdominal MRI to confirm probable metastasis. 3. Incidental findings, including: Left nephrolithiasis.Cholelithiasis. Coronary artery atherosclerosis. Aortic Atherosclerosis    09/14/2022 - 09/14/2022 Chemotherapy   Patient is on Treatment Plan : ESOPHAGUS Carboplatin + Paclitaxel Weekly X 6 Weeks with XRT     09/23/2022 Imaging   MRI abdomen with and without contrast showed 1. Mildly T2 hyperintense segment VII hepatic lesion measuring 2.7 cm with imaging characteristics compatible with metastatic disease. 2. Tiny focus of delayed enhancement in the inferior right lobe of the liver segment VI measuring 8 mm with ill-defined increased T2 signal and subtle corresponding reduced diffusivity, also suspicious for metastatic disease. 3. Partially visualized distal esophageal wall thickening compatible with the patient's known primary neoplasm. 4. Similar size of the 11 mm gastrohepatic ligament lymph node mildly metabolic on prior PET-CT and compatible with local nodal disease involvement. 5. 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 Recommend follow up pre and post-contrast MRI/MRCP in 1 year. 6. Diffuse hepatic steatosis.   10/10/2022 Procedure   LIVER MASS; CT-GUIDED BIOPSY:  - MODERATE TO POORLY DIFFERENTIATED ADENOCARCINOMA MORPHOLOGICALLY  CONSISTENT WITH METASTASIS FROM PATIENT'S KNOWN ESOPHAGEAL  ADENOCARCINOMA.     10/17/2022 Procedure   Medi port placed by Dr. Wyn Quaker   10/21/2022 - 01/15/2023 Chemotherapy   Patient is on Treatment Plan : ESOPHAGEAL ADENOCARCINOMA FOLFOX q14d x 6 cycles     01/15/2023 Imaging   PET showed  1. Interval progression in metastatic esophageal carcinoma as evidenced by hypermetabolic lymph nodes in the neck, chest, abdomen and pelvis, an enlarging right hepatic lobe metastasis, new bilateral adrenal metastases and new osseous metastases. 2. Cholelithiasis. 3. Left renal stones. 4. Aortic atherosclerosis (ICD10-I70.0). Coronary artery calcification.    01/27/2023 - 03/14/2023 Chemotherapy   Patient is on Treatment Plan : GASTROESOPHAGEAL FOLFOX + Nivolumab q14d     03/20/2023 Imaging   CT chest abdomen pelvis w contrast showed 1. Interval progression of metastatic disease with multiple new and enlarged hypodense lesions throughout the liver. 2. Significant enlargement of bilateral adrenal metastases. 3. Subtle sclerosis of an osseous metastasis of the right femoral neck. Other previously FDG avid osseous metastases of the left femoral neck and right sixth rib not appreciated by CT. 4. No persistently enlarged lymph nodes. 5. Interval development of bandlike consolidation and fibrosis of the paramedian right lung, particularly of the right lower lobe, as well as additional scattered irregular opacities throughout the right lung. 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 6. Similar circumferential wall thickening throughout the mid to lower esophagus, consistent with known primary esophageal adenocarcinoma. 7. Nonobstructive bilateral nephrolithiasis. Aortic Atherosclerosis   03/27/2023 -  Chemotherapy   Patient is on Treatment Plan : GASTROESOPHAGEAL Ramucirumab D1, 15 + Paclitaxel D1,8,15 q28d     05/02/2023 - 05/08/2023 Hospital Admission   Admission due to coffee-ground emesis. EGD 10/7 showed bleeding from esophageal cancer,  esophagitis due to chemo  and radiation. Applied Hemospray. Eliquis was stopped. Acute blood loss anemia, received IV venofer treatments x 3 doses      ALLERGIES:  has No Known Allergies.  MEDICATIONS:  Current Outpatient Medications  Medication Sig Dispense Refill   acetaminophen (TYLENOL) 650 MG CR tablet Take 1,300 mg by mouth every 8 (eight) hours as needed for pain.     azelastine (ASTELIN) 0.1 % nasal spray Place 1 spray into both nostrils 2 (two) times daily. Use in each nostril as directed 30 mL 1   benzonatate (TESSALON) 200 MG capsule Take 1 capsule (200 mg total) by mouth 3 (three) times daily as needed for cough. 20 capsule 5   chlorhexidine (PERIDEX) 0.12 % solution USE AS DIRECTED TAKE  15  ML  IN  THE  MOUTH  OR  THROAT  TWICE  DAILY 473 mL 0   Cholecalciferol (VITAMIN D3) 125 MCG (5000 UT) CAPS Take 5,000 Units by mouth daily.     levothyroxine (SYNTHROID) 125 MCG tablet Take 125 mcg by mouth every morning.     Multiple Vitamins-Minerals (MULTIVITAMIN WITH MINERALS) tablet Take 1 tablet by mouth daily.     ondansetron (ZOFRAN) 8 MG tablet Take 1 tablet (8 mg total) by mouth every 8 (eight) hours as needed for nausea or vomiting. Start on the third day after chemotherapy.     pantoprazole (PROTONIX) 40 MG tablet Take 1 tablet (40 mg total) by mouth 2 (two) times daily. 60 tablet 2   potassium chloride SA (KLOR-CON M) 20 MEQ tablet Take 2 tablets (40 mEq total) by mouth 2 (two) times daily. 60 tablet 1   predniSONE (DELTASONE) 10 MG tablet Take 1 tablet (10 mg total) by mouth daily with breakfast. 30 tablet 1   prochlorperazine (COMPAZINE) 10 MG tablet Take 1 tablet (10 mg total) by mouth every 6 (six) hours as needed for nausea or vomiting.     scopolamine (TRANSDERM-SCOP) 1 MG/3DAYS Place 1 patch (1.5 mg total) onto the skin every 3 (three) days. 10 patch 0   No current facility-administered medications for this visit.    VITAL SIGNS: There were no vitals taken for this  visit. There were no vitals filed for this visit.  Estimated body mass index is 29.36 kg/m as calculated from the following:   Height as of 05/29/23: 5\' 9"  (1.753 m).   Weight as of 06/05/23: 198 lb 12.8 oz (90.2 kg).  LABS: CBC:    Component Value Date/Time   WBC 5.1 06/05/2023 0800   WBC 3.9 (L) 05/08/2023 0443   HGB 11.1 (L) 06/05/2023 0800   HCT 36.3 (L) 06/05/2023 0800   PLT 274 06/05/2023 0800   MCV 89.6 06/05/2023 0800   NEUTROABS 2.5 06/05/2023 0800   LYMPHSABS 1.5 06/05/2023 0800   MONOABS 0.9 06/05/2023 0800   EOSABS 0.0 06/05/2023 0800   BASOSABS 0.1 06/05/2023 0800   Comprehensive Metabolic Panel:    Component Value Date/Time   NA 139 06/05/2023 0800   K 3.8 06/05/2023 0800   CL 107 06/05/2023 0800   CO2 23 06/05/2023 0800   BUN 9 06/05/2023 0800   CREATININE 1.11 06/05/2023 0800   GLUCOSE 94 06/05/2023 0800   CALCIUM 9.5 06/05/2023 0800   AST 24 06/05/2023 0800   ALT 21 06/05/2023 0800   ALKPHOS 63 06/05/2023 0800   BILITOT 0.5 06/05/2023 0800   PROT 7.3 06/05/2023 0800   ALBUMIN 3.6 06/05/2023 0800    RADIOGRAPHIC STUDIES: MR Brain W Wo Contrast  Result  Date: 06/04/2023 CLINICAL DATA:  Brain metastases suspected EXAM: MRI HEAD WITHOUT AND WITH CONTRAST TECHNIQUE: Multiplanar, multiecho pulse sequences of the brain and surrounding structures were obtained without and with intravenous contrast. CONTRAST:  9mL GADAVIST GADOBUTROL 1 MMOL/ML IV SOLN COMPARISON:  04/18/2021 MRI head IAC protocol, correlation is also made with 05/21/2023 PET-CT the FINDINGS: Brain: No restricted diffusion to suggest acute or subacute infarct. No abnormal parenchymal or meningeal enhancement. No acute hemorrhage, mass, mass effect, or midline shift. No hydrocephalus or extra-axial collection. Pituitary and craniocervical junction within normal limits. Punctate focus of hemosiderin deposition in the medial left cerebellum, likely the sequela of remote hypertensive microhemorrhage,  which was present on the 2022 exam. Normal cerebral volume for age. Scattered T2 hyperintense signal in the periventricular white matter, likely the sequela of mild chronic small vessel ischemic disease. Vascular: Normal arterial flow voids. Normal arterial and venous enhancement. Skull and upper cervical spine: Normal marrow signal. Sinuses/Orbits: Clear paranasal sinuses. No acute finding in the orbits. Other: Fluid in the left mastoid air cells. IMPRESSION: No acute intracranial process. No evidence of intracranial metastatic disease. Electronically Signed   By: Wiliam Ke M.D.   On: 06/04/2023 18:31   NM PET Image Initial (PI) Skull Base To Thigh (F-18 FDG)  Result Date: 05/28/2023 CLINICAL DATA:  Subsequent treatment strategy for metastatic esophageal cancer. EXAM: NUCLEAR MEDICINE PET SKULL BASE TO THIGH TECHNIQUE: 10.83 mCi F-18 FDG was injected intravenously. Full-ring PET imaging was performed from the skull base to thigh after the radiotracer. CT data was obtained and used for attenuation correction and anatomic localization. Fasting blood glucose: 79 mg/dl COMPARISON:  Multiple prior imaging studies. The most recent PET-CT is 01/08/2023 FINDINGS: Mediastinal blood pool activity: SUV max 2.24 Liver activity: SUV max NA NECK: No hypermetabolic lymph nodes in the neck. Possible hypermetabolic brain lesions. MRI suggested for further evaluation. Incidental CT findings: Mild symmetric hypermetabolism both thyroid lobes likely due to thyroiditis. CHEST: Progressive hypermetabolism in the distal esophagus with SUV max of 9.8 a. findings consistent with progressive disease. Chronic medial right lower lobe atelectasis likely radiation change. Moderate hypermetabolism with SUV max 5.57 but no obvious tumor. No enlarged or hypermetabolic mediastinal hilar lymph nodes. No worrisome pulmonary nodules on the CT scan. Incidental CT findings: Stable age advanced vascular calcifications. ABDOMEN/PELVIS:  Significant progression of metastatic hepatic disease. Large coalescence right hepatic lobe mass measuring approximately 10 cm with largely rim like hypermetabolism with SUV max of 12.44. Three small hypermetabolic lesions in the left hepatic lobe consistent with metastatic disease. The largest lesion measures 16 mm and SUV max is 6.96. There is also a small lesion in segment 6 with SUV max of 8.61. Enlarging right adrenal gland mass measuring 3.9 x 3.1 cm (previous 2.5 x 2.3 cm). SUV max is 10.39 (previously 4.6). FDG uptake in the anorectal area with SUV max of 7.55, similar to prior study and likely physiologic. Internal hemorrhoids are also possible. No enlarged or hypermetabolic abdominal or pelvic lymph nodes. Incidental CT findings: Stable gallstones, renal calculi and vascular calcifications. SKELETON: New hypermetabolic bone lesions are noted in the T12 and L2 vertebral bodies. T12 lesion has an SUV max of 6.87 and L1 lesion has an SUV max of 10.47 vaguely lytic lesions seen on the CT scan. There is also a hypermetabolic lesion in the left sacrum with SUV max of 5.06. Small hypermetabolic right sixth posterior rib lesion has an SUV max of 6.04. Incidental CT findings: None. IMPRESSION: 1. Progressive hypermetabolism in  the distal esophagus consistent with progressive disease. 2. Significant progression of metastatic hepatic disease (new and enlarging lesions). 3. Enlarging and progressively hypermetabolic right adrenal gland mass. 4. New hypermetabolic bone lesions in the T12 and L2 vertebral bodies, left sacrum and right sixth posterior rib consistent with metastatic disease. 5. Possible hypermetabolic brain lesions. MRI suggested for further evaluation. 6. Chronic medial right lower lobe atelectasis likely radiation change. Moderate hypermetabolism but no obvious tumor. 7. Stable gallstones, renal calculi and vascular calcifications. Electronically Signed   By: Rudie Meyer M.D.   On: 05/28/2023 14:43     PERFORMANCE STATUS (ECOG) : 1 - Symptomatic but completely ambulatory  Review of Systems Unless otherwise noted, a complete review of systems is negative.  Physical Exam General: NAD Pulmonary: Unlabored Extremities: no edema, no joint deformities Skin: no rashes Neurological: Weakness but otherwise nonfocal  IMPRESSION: Met with patient today following his visit with nutrition.  Patient is been referred to [facility] for second opinion and possible consideration of clinical trials.  However, he says that [facility] is not within his insurance network.  Recommended that he see if [facility] or [facility] were within network.  Symptomatically, he describes esophageal spasms that follows shortly after eating and drinking.  He has been tried on scopolamine.  Discussed other options for medications such as Carafate or antispasmodic but patient would like to see if he improves with radiation prior to trying other medications.  Patient says that he recognizes that his cancer will ultimately prove terminal.  He is in agreement with current scope of treatment.  He says that he is finalizing his end-of-life decisions.  I sent him home with a MOST form to review.  Patient says that he is considering what to do when he reaches a point where he can no longer care for himself.  He currently lives at home alone and has limited social support.  We discussed future option of hospice involvement.  He is familiar with hospice through the care of family.  PLAN: -Continue current scope of treatment -MOST Form reviewed -RTC 2 to 3 weeks   Patient expressed understanding and was in agreement with this plan. He also understands that He can call the clinic at any time with any questions, concerns, or complaints.     Time Total: 25 minutes  Visit consisted of counseling and education dealing with the complex and emotionally intense issues of symptom management and palliative care in the setting of serious and potentially  life-threatening illness.Greater than 50%  of this time was spent counseling and coordinating care related to the above assessment and plan.  Signed by: Laurette Schimke, PhD, NP-C

## 2023-06-06 NOTE — Progress Notes (Signed)
Nutrition Follow-up:  Patient with esophageal cancer.  Receiving paclitaxel and cyramza.  Noted hospitalization for GI bleed from cancer. Patient started palliative radiation.  Met with patient following radiation.  Reports that his appetite has been decreased and not feeling good since the end of September and month of October.  Was able to eat some broccoli and cheese soup yesterday, cereal.  Vomited after having ice cream last night.  Still having excessive drainage/mucus.  Starting scopolamine patch.  Friend has brought him left over osmolite 1.5 and he is mixing chocolate milk in it and drinking along with other 350 calorie shakes.     Medications: reviewed  Labs: reviewed  Anthropometrics:   Weight 198 lb 12.8 oz on 11/7 217 lb 14.4 oz on 9/27  219 lb on 9/6 220 lb on 8/14 223 lb on 7/31 236 lb on 5/13 265 lb on 1/31   NUTRITION DIAGNOSIS: Inadequate oral intake ongoing  INTERVENTION:  Continue high calorie, high protein soft foods.   Continue 350 + calorie shakes    MONITORING, EVALUATION, GOAL: weight trends, intake   NEXT VISIT: Thursday, Dec 5 during infusion  Jonathan [name], RD, LDN Registered Dietitian [phone]

## 2023-06-07 DIAGNOSIS — G4733 Obstructive sleep apnea (adult) (pediatric): Secondary | ICD-10-CM | POA: Diagnosis not present

## 2023-06-09 ENCOUNTER — Other Ambulatory Visit: Payer: Self-pay

## 2023-06-09 ENCOUNTER — Encounter: Payer: Self-pay | Admitting: Oncology

## 2023-06-09 ENCOUNTER — Ambulatory Visit
Admission: RE | Admit: 2023-06-09 | Discharge: 2023-06-09 | Disposition: A | Discharge status: Home / Self Care | Payer: No Typology Code available for payment source | Source: Ambulatory Visit | Attending: Radiation Oncology | Admitting: Radiation Oncology

## 2023-06-09 DIAGNOSIS — Z510 Encounter for antineoplastic radiation therapy: Secondary | ICD-10-CM | POA: Diagnosis not present

## 2023-06-09 DIAGNOSIS — C155 Malignant neoplasm of lower third of esophagus: Secondary | ICD-10-CM | POA: Diagnosis not present

## 2023-06-09 LAB — RAD ONC ARIA SESSION SUMMARY
Course Elapsed Days: 4
Plan Fractions Treated to Date: 3
Plan Prescribed Dose Per Fraction: 2 Gy
Plan Total Fractions Prescribed: 15
Plan Total Prescribed Dose: 30 Gy
Reference Point Dosage Given to Date: 6 Gy
Reference Point Session Dosage Given: 2 Gy
Session Number: 3

## 2023-06-09 MED ORDER — PANTOPRAZOLE SODIUM 40 MG PO TBEC
40.0000 mg | DELAYED_RELEASE_TABLET | Freq: Two times a day (BID) | ORAL | 1 refills | Status: DC
  Filled 2023-06-09: qty 60, 30d supply, fill #0
  Filled 2023-07-17: qty 60, 30d supply, fill #1

## 2023-06-10 ENCOUNTER — Ambulatory Visit
Admission: RE | Admit: 2023-06-10 | Discharge: 2023-06-10 | Disposition: A | Discharge status: Home / Self Care | Payer: No Typology Code available for payment source | Source: Ambulatory Visit | Attending: Radiation Oncology | Admitting: Radiation Oncology

## 2023-06-10 ENCOUNTER — Other Ambulatory Visit: Payer: Self-pay

## 2023-06-10 ENCOUNTER — Telehealth: Payer: Self-pay | Admitting: *Deleted

## 2023-06-10 DIAGNOSIS — Z510 Encounter for antineoplastic radiation therapy: Secondary | ICD-10-CM | POA: Diagnosis not present

## 2023-06-10 DIAGNOSIS — C155 Malignant neoplasm of lower third of esophagus: Secondary | ICD-10-CM | POA: Diagnosis not present

## 2023-06-10 LAB — RAD ONC ARIA SESSION SUMMARY
Course Elapsed Days: 5
Plan Fractions Treated to Date: 4
Plan Prescribed Dose Per Fraction: 2 Gy
Plan Total Fractions Prescribed: 15
Plan Total Prescribed Dose: 30 Gy
Reference Point Dosage Given to Date: 8 Gy
Reference Point Session Dosage Given: 2 Gy
Session Number: 4

## 2023-06-10 NOTE — Telephone Encounter (Signed)
Call from [facility] stating that they are not in patient insurance network and requesting that he be referred somewhere else

## 2023-06-11 ENCOUNTER — Other Ambulatory Visit: Payer: Self-pay

## 2023-06-11 ENCOUNTER — Ambulatory Visit: Payer: No Typology Code available for payment source | Admitting: Radiation Oncology

## 2023-06-11 ENCOUNTER — Encounter: Payer: Self-pay | Admitting: Oncology

## 2023-06-11 ENCOUNTER — Ambulatory Visit
Admission: RE | Admit: 2023-06-11 | Discharge: 2023-06-11 | Disposition: A | Discharge status: Home / Self Care | Payer: No Typology Code available for payment source | Source: Ambulatory Visit | Attending: Radiation Oncology | Admitting: Radiation Oncology

## 2023-06-11 DIAGNOSIS — Z515 Encounter for palliative care: Secondary | ICD-10-CM | POA: Diagnosis not present

## 2023-06-11 DIAGNOSIS — C159 Malignant neoplasm of esophagus, unspecified: Secondary | ICD-10-CM | POA: Diagnosis not present

## 2023-06-11 DIAGNOSIS — C155 Malignant neoplasm of lower third of esophagus: Secondary | ICD-10-CM | POA: Diagnosis not present

## 2023-06-11 DIAGNOSIS — C158 Malignant neoplasm of overlapping sites of esophagus: Secondary | ICD-10-CM | POA: Diagnosis not present

## 2023-06-11 DIAGNOSIS — Z510 Encounter for antineoplastic radiation therapy: Secondary | ICD-10-CM | POA: Diagnosis not present

## 2023-06-11 DIAGNOSIS — Z5112 Encounter for antineoplastic immunotherapy: Secondary | ICD-10-CM | POA: Diagnosis not present

## 2023-06-11 LAB — RAD ONC ARIA SESSION SUMMARY
Course Elapsed Days: 6
Plan Fractions Treated to Date: 5
Plan Prescribed Dose Per Fraction: 2 Gy
Plan Total Fractions Prescribed: 15
Plan Total Prescribed Dose: 30 Gy
Reference Point Dosage Given to Date: 10 Gy
Reference Point Session Dosage Given: 2 Gy
Session Number: 5

## 2023-06-11 NOTE — Telephone Encounter (Signed)
Per [employer] site it looks like [facility]  is covered

## 2023-06-11 NOTE — Telephone Encounter (Signed)
Referral faxed to heme/onc at [facility].   ph: [phone]  fax: [phone]  Fax confirmation received

## 2023-06-12 ENCOUNTER — Other Ambulatory Visit: Payer: Self-pay | Admitting: Oncology

## 2023-06-12 ENCOUNTER — Inpatient Hospital Stay (HOSPITAL_BASED_OUTPATIENT_CLINIC_OR_DEPARTMENT_OTHER): Payer: No Typology Code available for payment source | Admitting: Hospice and Palliative Medicine

## 2023-06-12 ENCOUNTER — Ambulatory Visit
Admission: RE | Admit: 2023-06-12 | Discharge: 2023-06-12 | Disposition: A | Discharge status: Home / Self Care | Payer: No Typology Code available for payment source | Source: Ambulatory Visit | Attending: Radiation Oncology | Admitting: Radiation Oncology

## 2023-06-12 ENCOUNTER — Inpatient Hospital Stay: Payer: No Typology Code available for payment source

## 2023-06-12 ENCOUNTER — Other Ambulatory Visit: Payer: Self-pay

## 2023-06-12 VITALS — BP 149/100 | HR 92 | Temp 98.0°F | Resp 26 | Ht 69.0 in | Wt 194.6 lb

## 2023-06-12 DIAGNOSIS — C155 Malignant neoplasm of lower third of esophagus: Secondary | ICD-10-CM | POA: Diagnosis not present

## 2023-06-12 DIAGNOSIS — C159 Malignant neoplasm of esophagus, unspecified: Secondary | ICD-10-CM

## 2023-06-12 DIAGNOSIS — Z515 Encounter for palliative care: Secondary | ICD-10-CM | POA: Diagnosis not present

## 2023-06-12 DIAGNOSIS — Z510 Encounter for antineoplastic radiation therapy: Secondary | ICD-10-CM | POA: Diagnosis not present

## 2023-06-12 DIAGNOSIS — Z5112 Encounter for antineoplastic immunotherapy: Secondary | ICD-10-CM | POA: Diagnosis not present

## 2023-06-12 LAB — CMP (CANCER CENTER ONLY)
ALT: 20 U/L (ref 0–44)
AST: 24 U/L (ref 15–41)
Albumin: 3.7 g/dL (ref 3.5–5.0)
Alkaline Phosphatase: 61 U/L (ref 38–126)
Anion gap: 12 (ref 5–15)
BUN: 14 mg/dL (ref 8–23)
CO2: 24 mmol/L (ref 22–32)
Calcium: 9.4 mg/dL (ref 8.9–10.3)
Chloride: 101 mmol/L (ref 98–111)
Creatinine: 1.12 mg/dL (ref 0.61–1.24)
GFR, Estimated: >60 mL/min (ref >60)
Glucose, Bld: 100 mg/dL — ABNORMAL HIGH (ref 70–99)
Potassium: 3.3 mmol/L — ABNORMAL LOW (ref 3.5–5.1)
Sodium: 137 mmol/L (ref 135–145)
Total Bilirubin: 0.7 mg/dL (ref <1.2)
Total Protein: 7.4 g/dL (ref 6.5–8.1)

## 2023-06-12 LAB — CBC WITH DIFFERENTIAL (CANCER CENTER ONLY)
Abs Immature Granulocytes: 0.04 10*3/uL (ref 0.00–0.07)
Basophils Absolute: 0.1 10*3/uL (ref 0.0–0.1)
Basophils Relative: 1 %
Eosinophils Absolute: 0 10*3/uL (ref 0.0–0.5)
Eosinophils Relative: 0 %
HCT: 36.6 % — ABNORMAL LOW (ref 39.0–52.0)
Hemoglobin: 11.1 g/dL — ABNORMAL LOW (ref 13.0–17.0)
Immature Granulocytes: 1 %
Lymphocytes Relative: 10 %
Lymphs Abs: 0.8 10*3/uL (ref 0.7–4.0)
MCH: 27.2 pg (ref 26.0–34.0)
MCHC: 30.3 g/dL (ref 30.0–36.0)
MCV: 89.7 fL (ref 80.0–100.0)
Monocytes Absolute: 0.5 10*3/uL (ref 0.1–1.0)
Monocytes Relative: 6 %
Neutro Abs: 6.5 10*3/uL (ref 1.7–7.7)
Neutrophils Relative %: 82 %
Platelet Count: 302 10*3/uL (ref 150–400)
RBC: 4.08 MIL/uL — ABNORMAL LOW (ref 4.22–5.81)
RDW: 18.6 % — ABNORMAL HIGH (ref 11.5–15.5)
WBC Count: 7.9 10*3/uL (ref 4.0–10.5)
nRBC: 0 % (ref 0.0–0.2)

## 2023-06-12 LAB — RAD ONC ARIA SESSION SUMMARY
Course Elapsed Days: 7
Plan Fractions Treated to Date: 6
Plan Prescribed Dose Per Fraction: 2 Gy
Plan Total Fractions Prescribed: 15
Plan Total Prescribed Dose: 30 Gy
Reference Point Dosage Given to Date: 12 Gy
Reference Point Session Dosage Given: 2 Gy
Session Number: 6

## 2023-06-12 LAB — RETIC PANEL
Immature Retic Fract: 17 % — ABNORMAL HIGH (ref 2.3–15.9)
RBC.: 4.07 MIL/uL — ABNORMAL LOW (ref 4.22–5.81)
Retic Count, Absolute: 20.8 10*3/uL (ref 19.0–186.0)
Retic Ct Pct: 0.5 % (ref 0.4–3.1)
Reticulocyte Hemoglobin: 31.3 pg (ref >27.9)

## 2023-06-12 MED ORDER — SODIUM CHLORIDE 0.9 % IV SOLN
80.0000 mg/m2 | Freq: Once | INTRAVENOUS | Status: AC
  Administered 2023-06-12: 174 mg via INTRAVENOUS
  Filled 2023-06-12: qty 29

## 2023-06-12 MED ORDER — FAMOTIDINE IN NACL 20-0.9 MG/50ML-% IV SOLN
20.0000 mg | Freq: Once | INTRAVENOUS | Status: AC
  Administered 2023-06-12: 20 mg via INTRAVENOUS
  Filled 2023-06-12: qty 50

## 2023-06-12 MED ORDER — ONDANSETRON HCL 4 MG/2ML IJ SOLN
8.0000 mg | Freq: Once | INTRAMUSCULAR | Status: AC
  Administered 2023-06-12: 8 mg via INTRAVENOUS
  Filled 2023-06-12: qty 4

## 2023-06-12 MED ORDER — DIPHENHYDRAMINE HCL 50 MG/ML IJ SOLN
50.0000 mg | Freq: Once | INTRAMUSCULAR | Status: AC
  Administered 2023-06-12: 50 mg via INTRAVENOUS
  Filled 2023-06-12: qty 1

## 2023-06-12 MED ORDER — GLYCOPYRROLATE 1 MG PO TABS: 0.5000 mg | ORAL_TABLET | Freq: Two times a day (BID) | ORAL | 0 refills | Status: DC | PRN

## 2023-06-12 MED ORDER — SODIUM CHLORIDE 0.9 % IV SOLN
Freq: Once | INTRAVENOUS | Status: AC
  Filled 2023-06-12: qty 250

## 2023-06-12 MED ORDER — HEPARIN SOD (PORK) LOCK FLUSH 100 UNIT/ML IV SOLN
500.0000 [IU] | Freq: Once | INTRAVENOUS | Status: AC | PRN
Start: 2023-06-12 — End: 2023-06-12
  Administered 2023-06-12: 500 [IU]
  Filled 2023-06-12: qty 5

## 2023-06-12 MED ORDER — PROCHLORPERAZINE EDISYLATE 10 MG/2ML IJ SOLN
10.0000 mg | Freq: Once | INTRAMUSCULAR | Status: AC
  Administered 2023-06-12: 10 mg via INTRAVENOUS
  Filled 2023-06-12: qty 2

## 2023-06-12 MED ORDER — DEXAMETHASONE SODIUM PHOSPHATE 10 MG/ML IJ SOLN
10.0000 mg | Freq: Once | INTRAMUSCULAR | Status: AC
  Administered 2023-06-12: 10 mg via INTRAVENOUS
  Filled 2023-06-12: qty 1

## 2023-06-12 NOTE — Progress Notes (Signed)
Patient arrived today for labs and treatment. Upon arrival patient stating he feels nauseas and vomited this morning. He also has no appetite and overall feels "pretty bad and worse than normally after his treatments". Cathie Hoops, MD notified and iv compazine given per order. Nausea slightly improved post compazine. Cathie Hoops, MD notified RR 22-28 and HR >100 at rest. Ok to proceed with treatment per Cathie Hoops, MD. Borders, NP came to see patient regarding symptoms. 8 mg IV Zofran ordered per Borders, NP. Glycopyrrolate also going to be added to help with secretions per Borders, NP. Pt verified understanding and ok with plan. Pt stable at discharge and educated to call tomorrow if symptoms worsen or remain the same after new interventions.

## 2023-06-12 NOTE — Patient Instructions (Signed)

## 2023-06-12 NOTE — Progress Notes (Signed)
Palliative Medicine [facility] at [facility] Telephone:([phone] Fax:([phone]) [phone]   Name: Jonathan [name] Date: 06/12/2023 MRN: [id number]  DOB: Mar 30, 1956  Patient Care Team: [name], MD as PCP - General (Family Medicine) [name], RN as Oncology Nurse Navigator Rickard Patience, MD as Consulting Physician (Oncology)    REASON FOR CONSULTATION: Jonathan [name] is a 67 y.o. 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  He was referred to palliative care to address goals.  SOCIAL HISTORY:     reports that he has never smoked. He has never used smokeless tobacco. He reports current alcohol use. He reports that he does not use drugs.  Patient unmarried has no children.  Lives at home alone.  ADVANCE DIRECTIVES:  Not on file  CODE STATUS:   PAST MEDICAL HISTORY: Past Medical History:  Diagnosis Date   Arthritis    Complication of anesthesia    OCCURRED ONCE YEARS AGO 1981   Esophageal mass    Hypertension    Hypothyroidism    PONV (postoperative nausea and vomiting)     PAST SURGICAL HISTORY:  Past Surgical History:  Procedure Laterality Date   COLONOSCOPY     ESOPHAGOGASTRODUODENOSCOPY N/A 08/23/2022   Procedure: ESOPHAGOGASTRODUODENOSCOPY (EGD);  Surgeon: Jaynie Collins, DO;  Location: [facility] ENDOSCOPY;  Service: Gastroenterology;  Laterality: N/A;   ESOPHAGOGASTRODUODENOSCOPY (EGD) WITH PROPOFOL N/A 05/05/2023   Procedure: ESOPHAGOGASTRODUODENOSCOPY (EGD) WITH PROPOFOL;  Surgeon: Jaynie Collins, DO;  Location: [facility] ENDOSCOPY;  Service: Gastroenterology;  Laterality: N/A;   EUS N/A 09/12/2022   Procedure: FULL UPPER ENDOSCOPIC ULTRASOUND (EUS) RADIAL;  Surgeon: Bearl Mulberry, MD;  Location: [facility] ENDOSCOPY;  Service: Gastroenterology;  Laterality: N/A;   FINGER SURGERY      HEMOSTASIS CONTROL  05/05/2023   Procedure: HEMOSTASIS CONTROL;  Surgeon: Jaynie Collins, DO;  Location: [facility] ENDOSCOPY;  Service: Gastroenterology;;   PORTA CATH INSERTION N/A 10/17/2022   Procedure: PORTA CATH INSERTION;  Surgeon: Annice Needy, MD;  Location: [facility] INVASIVE CV LAB;  Service: Cardiovascular;  Laterality: N/A;   TENDON REPAIR IN LEFT KNEE      HEMATOLOGY/ONCOLOGY HISTORY:  Oncology History  Adenocarcinoma of esophagus (HCC)  08/28/2022 Initial Diagnosis   Adenocarcinoma of esophagus   -Patient has noticed worsening of "food stuck/fullness" sensation since November 2023.  Patient had a barium swallow study which commented on marked mucosal irregularity in the distal esophagus with Broaddus base mural filling defect highly suspicious for malignancy.  Patient establish care with gastroenterology. -08/23/2022, EGD showed gastritis and partially obstructing malignant esophageal tumor in the lower third of the esophagus. Esophagus mass biopsy showed adenocarcinoma.  PD-L1 TPS 65%, HER2 negative.  [facility] NGS showed KRASG12V, CDKN2A, ARID1A, TP53, TMB 5.3, MSI stable.  Stomach biopsy showed gastric mucosa with no specific histology abnormality.  No significant intestinal metaplastic, dysplastic, granular atrophy or increased inflammation.     08/28/2022 Imaging   CT chest abdomen pelvis with contrast showed 1. Distal esophageal primary with gastrohepatic ligament nodal metastasis. 2. 2 right-sided pulmonary nodules, the largest of which measures 5 mm and is new since 2015. Pulmonary metastasis not be excluded. 3. Anterior right lower lobe volume loss and minimal soft tissue density, favoring atelectasis or scar. Recommend attention on follow-up. 4. Hepatic steatosis 5. Cholelithiasis 6. Left nephrolithiasis 7. Coronary artery atherosclerosis. Aortic Atherosclerosis   08/28/2022 Cancer Staging  Staging form: Esophagus - Adenocarcinoma, AJCC 8th Edition - Clinical stage  from 08/28/2022: Stage IVB (cT3, cN1, cM1) - Signed by Rickard Patience, MD on 10/04/2022 Stage prefix: Initial diagnosis   09/06/2022 Imaging   PET scan showed 1. Esophageal primary with gastrohepatic ligament nodal metastasis,as on CT. 2. Focus of hypermetabolism which is favored to registered to the posterior hepatic dome, in the region of subtle heterogeneity on prior diagnostic CT. Suboptimally evaluated secondary to underlying steatosis. Recommend further evaluation with pre and post contrast abdominal MRI to confirm probable metastasis. 3. Incidental findings, including: Left nephrolithiasis.Cholelithiasis. Coronary artery atherosclerosis. Aortic Atherosclerosis    09/14/2022 - 09/14/2022 Chemotherapy   Patient is on Treatment Plan : ESOPHAGUS Carboplatin + Paclitaxel Weekly X 6 Weeks with XRT     09/23/2022 Imaging   MRI abdomen with and without contrast showed 1. Mildly T2 hyperintense segment VII hepatic lesion measuring 2.7 cm with imaging characteristics compatible with metastatic disease. 2. Tiny focus of delayed enhancement in the inferior right lobe of the liver segment VI measuring 8 mm with ill-defined increased T2 signal and subtle corresponding reduced diffusivity, also suspicious for metastatic disease. 3. Partially visualized distal esophageal wall thickening compatible with the patient's known primary neoplasm. 4. Similar size of the 11 mm gastrohepatic ligament lymph node mildly metabolic on prior PET-CT and compatible with local nodal disease involvement. 5. 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 Recommend follow up pre and post-contrast MRI/MRCP in 1 year. 6. Diffuse hepatic steatosis.   10/10/2022 Procedure   LIVER MASS; CT-GUIDED BIOPSY:  - MODERATE TO POORLY DIFFERENTIATED ADENOCARCINOMA MORPHOLOGICALLY  CONSISTENT WITH METASTASIS FROM PATIENT'S KNOWN ESOPHAGEAL  ADENOCARCINOMA.     10/17/2022 Procedure   Medi port placed by Dr. Wyn Quaker   10/21/2022 - 01/15/2023 Chemotherapy   Patient is on Treatment Plan : ESOPHAGEAL ADENOCARCINOMA FOLFOX q14d x 6 cycles     01/15/2023 Imaging   PET showed  1. Interval progression in metastatic esophageal carcinoma as evidenced by hypermetabolic lymph nodes in the neck, chest, abdomen and pelvis, an enlarging right hepatic lobe metastasis, new bilateral adrenal metastases and new osseous metastases. 2. Cholelithiasis. 3. Left renal stones. 4. Aortic atherosclerosis (ICD10-I70.0). Coronary artery calcification.    01/27/2023 - 03/14/2023 Chemotherapy   Patient is on Treatment Plan : GASTROESOPHAGEAL FOLFOX + Nivolumab q14d     03/20/2023 Imaging   CT chest abdomen pelvis w contrast showed 1. Interval progression of metastatic disease with multiple new and enlarged hypodense lesions throughout the liver. 2. Significant enlargement of bilateral adrenal metastases. 3. Subtle sclerosis of an osseous metastasis of the right femoral neck. Other previously FDG avid osseous metastases of the left femoral neck and right sixth rib not appreciated by CT. 4. No persistently enlarged lymph nodes. 5. Interval development of bandlike consolidation and fibrosis of the paramedian right lung, particularly of the right lower lobe, as well as additional scattered irregular opacities throughout the right lung. 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 6. Similar circumferential wall thickening throughout the mid to lower esophagus, consistent with known primary esophageal adenocarcinoma. 7. Nonobstructive bilateral nephrolithiasis. Aortic Atherosclerosis   03/27/2023 -  Chemotherapy   Patient is on Treatment Plan : GASTROESOPHAGEAL Ramucirumab D1, 15 + Paclitaxel D1,8,15 q28d     05/02/2023 - 05/08/2023 Hospital Admission   Admission due to coffee-ground emesis. EGD 10/7 showed bleeding from esophageal cancer,  esophagitis due to chemo  and radiation. Applied Hemospray. Eliquis was stopped. Acute blood loss anemia, received IV venofer treatments x 3 doses      ALLERGIES:  has No Known Allergies.  MEDICATIONS:  Current Outpatient Medications  Medication Sig Dispense Refill   acetaminophen (TYLENOL) 650 MG CR tablet Take 1,300 mg by mouth every 8 (eight) hours as needed for pain.     azelastine (ASTELIN) 0.1 % nasal spray Place 1 spray into both nostrils 2 (two) times daily. Use in each nostril as directed 30 mL 1   benzonatate (TESSALON) 200 MG capsule Take 1 capsule (200 mg total) by mouth 3 (three) times daily as needed for cough. 20 capsule 5   chlorhexidine (PERIDEX) 0.12 % solution USE AS DIRECTED TAKE  15  ML  IN  THE  MOUTH  OR  THROAT  TWICE  DAILY 473 mL 0   Cholecalciferol (VITAMIN D3) 125 MCG (5000 UT) CAPS Take 5,000 Units by mouth daily.     levothyroxine (SYNTHROID) 125 MCG tablet Take 125 mcg by mouth every morning.     Multiple Vitamins-Minerals (MULTIVITAMIN WITH MINERALS) tablet Take 1 tablet by mouth daily.     ondansetron (ZOFRAN) 8 MG tablet Take 1 tablet (8 mg total) by mouth every 8 (eight) hours as needed for nausea or vomiting. Start on the third day after chemotherapy.     pantoprazole (PROTONIX) 40 MG tablet Take 1 tablet (40 mg total) by mouth 2 (two) times daily. 60 tablet 2   pantoprazole (PROTONIX) 40 MG tablet Take 1 tablet (40 mg total) by mouth 2 (two) times daily. 60 tablet 1   potassium chloride SA (KLOR-CON M) 20 MEQ tablet Take 2 tablets (40 mEq total) by mouth 2 (two) times daily. 60 tablet 1   predniSONE (DELTASONE) 10 MG tablet Take 1 tablet (10 mg total) by mouth daily with breakfast. 30 tablet 1   prochlorperazine (COMPAZINE) 10 MG tablet Take 1 tablet (10 mg total) by mouth every 6 (six) hours as needed for nausea or vomiting.     scopolamine (TRANSDERM-SCOP) 1 MG/3DAYS Place 1 patch (1.5 mg total) onto the skin every 3 (three) days. 10 patch 0   No current  facility-administered medications for this visit.   Facility-Administered Medications Ordered in Other Visits  Medication Dose Route Frequency Provider Last Rate Last Admin   PACLitaxel (TAXOL) 174 mg in sodium chloride 0.9 % 250 mL chemo infusion (</= 80mg /m2)  80 mg/m2 (Treatment Plan Recorded) Intravenous Once Rickard Patience, MD 279 mL/hr at 06/12/23 1508 174 mg at 06/12/23 1508    VITAL SIGNS: There were no vitals taken for this visit. There were no vitals filed for this visit.  Estimated body mass index is 28.73 kg/m as calculated from the following:   Height as of an earlier encounter on 06/12/23: 5\' 9"  (1.753 m).   Weight as of an earlier encounter on 06/12/23: 194 lb 8.9 oz (88.3 kg).  LABS: CBC:    Component Value Date/Time   WBC 7.9 06/12/2023 1234   WBC 3.9 (L) 05/08/2023 0443   HGB 11.1 (L) 06/12/2023 1234   HCT 36.6 (L) 06/12/2023 1234   PLT 302 06/12/2023 1234   MCV 89.7 06/12/2023 1234   NEUTROABS 6.5 06/12/2023 1234   LYMPHSABS 0.8 06/12/2023 1234   MONOABS 0.5 06/12/2023 1234   EOSABS 0.0 06/12/2023 1234   BASOSABS 0.1 06/12/2023 1234   Comprehensive Metabolic Panel:    Component Value Date/Time   NA 137 06/12/2023 1234   K 3.3 (  L) 06/12/2023 1234   CL 101 06/12/2023 1234   CO2 24 06/12/2023 1234   BUN 14 06/12/2023 1234   CREATININE 1.12 06/12/2023 1234   GLUCOSE 100 (H) 06/12/2023 1234   CALCIUM 9.4 06/12/2023 1234   AST 24 06/12/2023 1234   ALT 20 06/12/2023 1234   ALKPHOS 61 06/12/2023 1234   BILITOT 0.7 06/12/2023 1234   PROT 7.4 06/12/2023 1234   ALBUMIN 3.7 06/12/2023 1234    RADIOGRAPHIC STUDIES: MR Brain W Wo Contrast  Result Date: 06/04/2023 CLINICAL DATA:  Brain metastases suspected EXAM: MRI HEAD WITHOUT AND WITH CONTRAST TECHNIQUE: Multiplanar, multiecho pulse sequences of the brain and surrounding structures were obtained without and with intravenous contrast. CONTRAST:  9mL GADAVIST GADOBUTROL 1 MMOL/ML IV SOLN COMPARISON:  04/18/2021 MRI  head IAC protocol, correlation is also made with 05/21/2023 PET-CT the FINDINGS: Brain: No restricted diffusion to suggest acute or subacute infarct. No abnormal parenchymal or meningeal enhancement. No acute hemorrhage, mass, mass effect, or midline shift. No hydrocephalus or extra-axial collection. Pituitary and craniocervical junction within normal limits. 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 Normal cerebral volume for age. Scattered T2 hyperintense signal in the periventricular white matter, likely the sequela of mild chronic small vessel ischemic disease. Vascular: Normal arterial flow voids. Normal arterial and venous enhancement. Skull and upper cervical spine: Normal marrow signal. Sinuses/Orbits: Clear paranasal sinuses. No acute finding in the orbits. Other: Fluid in the left mastoid air cells. IMPRESSION: No acute intracranial process. No evidence of intracranial metastatic disease. Electronically Signed   By: Wiliam Ke M.D.   On: 06/04/2023 18:31   NM PET Image Initial (PI) Skull Base To Thigh (F-18 FDG)  Result Date: 05/28/2023 CLINICAL DATA:  Subsequent treatment strategy for metastatic esophageal cancer. EXAM: NUCLEAR MEDICINE PET SKULL BASE TO THIGH TECHNIQUE: 10.83 mCi F-18 FDG was injected intravenously. Full-ring PET imaging was performed from the skull base to thigh after the radiotracer. CT data was obtained and used for attenuation correction and anatomic localization. Fasting blood glucose: 79 mg/dl COMPARISON:  Multiple prior imaging studies. The most recent PET-CT is 01/08/2023 FINDINGS: Mediastinal blood pool activity: SUV max 2.24 Liver activity: SUV max NA NECK: No hypermetabolic lymph nodes in the neck. Possible hypermetabolic brain lesions. MRI suggested for further evaluation. Incidental CT findings: Mild symmetric hypermetabolism both thyroid lobes  likely due to thyroiditis. CHEST: Progressive hypermetabolism in the distal esophagus with SUV max of 9.8 a. findings consistent with progressive disease. Chronic medial right lower lobe atelectasis likely radiation change. Moderate hypermetabolism with SUV max 5.57 but no obvious tumor. No enlarged or hypermetabolic mediastinal hilar lymph nodes. No worrisome pulmonary nodules on the CT scan. Incidental CT findings: Stable age advanced vascular calcifications. ABDOMEN/PELVIS: Significant progression of metastatic hepatic disease. Large coalescence right hepatic lobe mass measuring approximately 10 cm with largely rim like hypermetabolism with SUV max of 12.44. Three small hypermetabolic lesions in the left hepatic lobe consistent with metastatic disease. The largest lesion measures 16 mm and SUV max is 6.96. There is also a small lesion in segment 6 with SUV max of 8.61. Enlarging right adrenal gland mass measuring 3.9 x 3.1 cm (previous 2.5 x 2.3 cm). SUV max is 10.39 (previously 4.6). FDG uptake in the anorectal area with SUV max of 7.55, similar to prior study and likely physiologic. Internal hemorrhoids are also possible. No enlarged or hypermetabolic abdominal or pelvic  lymph nodes. Incidental CT findings: Stable gallstones, renal calculi and vascular calcifications. SKELETON: New hypermetabolic bone lesions are noted in the T12 and L2 vertebral bodies. T12 lesion has an SUV max of 6.87 and L1 lesion has an SUV max of 10.47 vaguely lytic lesions seen on the CT scan. There is also a hypermetabolic lesion in the left sacrum with SUV max of 5.06. Small hypermetabolic right sixth posterior rib lesion has an SUV max of 6.04. Incidental CT findings: None. IMPRESSION: 1. Progressive hypermetabolism in the distal esophagus consistent with progressive disease. 2. Significant progression of metastatic hepatic disease (new and enlarging lesions). 3. Enlarging and progressively hypermetabolic right adrenal gland mass. 4.  New hypermetabolic bone lesions in the T12 and L2 vertebral bodies, left sacrum and right sixth posterior rib consistent with metastatic disease. 5. Possible hypermetabolic brain lesions. MRI suggested for further evaluation. 6. Chronic medial right lower lobe atelectasis likely radiation change. Moderate hypermetabolism but no obvious tumor. 7. Stable gallstones, renal calculi and vascular calcifications. Electronically Signed   By: Rudie Meyer M.D.   On: 05/28/2023 14:43    PERFORMANCE STATUS (ECOG) : 1 - Symptomatic but completely ambulatory  Review of Systems Unless otherwise noted, a complete review of systems is negative.  Physical Exam General: NAD Pulmonary: Unlabored Extremities: no edema, no joint deformities Skin: no rashes Neurological: Weakness but otherwise nonfocal  IMPRESSION: Patient was an add-on to my clinic schedule today to address excess salivary production/drainage.  Patient seen in infusion.  Patient recently started on scopolamine by Dr. Cathie Hoops for excess salivary production in setting of esophageal cancer/radiation.  However, patient says that he only used the scopolamine for 2 to 3 days prior to removing the patch as it made him feel worsening weakness.  Discussed trial of glycopyrrolate instead as it is generally not associated with neurotoxicity.  Patient does have nausea today.  Will have RN give dose of ondansetron.  PLAN: -Continue current scope of treatment -MOST Form previously reviewed -Trial of glycopyrrolate -RTC 2 to 3 weeks  Case and plan discussed with Dr. Cathie Hoops  Patient expressed understanding and was in agreement with this plan. He also understands that He can call the clinic at any time with any questions, concerns, or complaints.     Time Total: 15 minutes  Visit consisted of counseling and education dealing with the complex and emotionally intense issues of symptom management and palliative care in the setting of serious and potentially  life-threatening illness.Greater than 50%  of this time was spent counseling and coordinating care related to the above assessment and plan.  Signed by: Laurette Schimke, PhD, NP-C

## 2023-06-13 ENCOUNTER — Ambulatory Visit
Admission: RE | Admit: 2023-06-13 | Discharge: 2023-06-13 | Disposition: A | Discharge status: Home / Self Care | Payer: No Typology Code available for payment source | Source: Ambulatory Visit | Attending: Radiation Oncology | Admitting: Radiation Oncology

## 2023-06-13 ENCOUNTER — Other Ambulatory Visit: Payer: Self-pay

## 2023-06-13 DIAGNOSIS — Z510 Encounter for antineoplastic radiation therapy: Secondary | ICD-10-CM | POA: Diagnosis not present

## 2023-06-13 DIAGNOSIS — C155 Malignant neoplasm of lower third of esophagus: Secondary | ICD-10-CM | POA: Diagnosis not present

## 2023-06-13 DIAGNOSIS — C158 Malignant neoplasm of overlapping sites of esophagus: Secondary | ICD-10-CM | POA: Diagnosis not present

## 2023-06-13 LAB — RAD ONC ARIA SESSION SUMMARY
Course Elapsed Days: 8
Plan Fractions Treated to Date: 7
Plan Prescribed Dose Per Fraction: 2 Gy
Plan Total Fractions Prescribed: 15
Plan Total Prescribed Dose: 30 Gy
Reference Point Dosage Given to Date: 14 Gy
Reference Point Session Dosage Given: 2 Gy
Session Number: 7

## 2023-06-16 ENCOUNTER — Ambulatory Visit
Admission: RE | Admit: 2023-06-16 | Discharge: 2023-06-16 | Disposition: A | Discharge status: Home / Self Care | Payer: No Typology Code available for payment source | Source: Ambulatory Visit | Attending: Radiation Oncology | Admitting: Radiation Oncology

## 2023-06-16 ENCOUNTER — Other Ambulatory Visit: Payer: Self-pay

## 2023-06-16 DIAGNOSIS — C155 Malignant neoplasm of lower third of esophagus: Secondary | ICD-10-CM | POA: Diagnosis not present

## 2023-06-16 DIAGNOSIS — Z510 Encounter for antineoplastic radiation therapy: Secondary | ICD-10-CM | POA: Diagnosis not present

## 2023-06-16 LAB — RAD ONC ARIA SESSION SUMMARY
Course Elapsed Days: 11
Plan Fractions Treated to Date: 8
Plan Prescribed Dose Per Fraction: 2 Gy
Plan Total Fractions Prescribed: 15
Plan Total Prescribed Dose: 30 Gy
Reference Point Dosage Given to Date: 16 Gy
Reference Point Session Dosage Given: 2 Gy
Session Number: 8

## 2023-06-17 ENCOUNTER — Ambulatory Visit
Admission: RE | Admit: 2023-06-17 | Discharge: 2023-06-17 | Disposition: A | Discharge status: Home / Self Care | Payer: No Typology Code available for payment source | Source: Ambulatory Visit | Attending: Radiation Oncology | Admitting: Radiation Oncology

## 2023-06-17 ENCOUNTER — Other Ambulatory Visit: Payer: Self-pay

## 2023-06-17 DIAGNOSIS — Z510 Encounter for antineoplastic radiation therapy: Secondary | ICD-10-CM | POA: Diagnosis not present

## 2023-06-17 DIAGNOSIS — C155 Malignant neoplasm of lower third of esophagus: Secondary | ICD-10-CM | POA: Diagnosis not present

## 2023-06-17 LAB — RAD ONC ARIA SESSION SUMMARY
Course Elapsed Days: 12
Plan Fractions Treated to Date: 9
Plan Prescribed Dose Per Fraction: 2 Gy
Plan Total Fractions Prescribed: 15
Plan Total Prescribed Dose: 30 Gy
Reference Point Dosage Given to Date: 18 Gy
Reference Point Session Dosage Given: 2 Gy
Session Number: 9

## 2023-06-18 ENCOUNTER — Ambulatory Visit
Admission: RE | Admit: 2023-06-18 | Discharge: 2023-06-18 | Disposition: A | Discharge status: Home / Self Care | Payer: No Typology Code available for payment source | Source: Ambulatory Visit | Attending: Radiation Oncology | Admitting: Radiation Oncology

## 2023-06-18 ENCOUNTER — Encounter: Payer: Self-pay | Admitting: Oncology

## 2023-06-18 ENCOUNTER — Other Ambulatory Visit: Payer: Self-pay

## 2023-06-18 DIAGNOSIS — C155 Malignant neoplasm of lower third of esophagus: Secondary | ICD-10-CM | POA: Diagnosis not present

## 2023-06-18 DIAGNOSIS — Z510 Encounter for antineoplastic radiation therapy: Secondary | ICD-10-CM | POA: Diagnosis not present

## 2023-06-18 LAB — RAD ONC ARIA SESSION SUMMARY
Course Elapsed Days: 13
Plan Fractions Treated to Date: 10
Plan Prescribed Dose Per Fraction: 2 Gy
Plan Total Fractions Prescribed: 15
Plan Total Prescribed Dose: 30 Gy
Reference Point Dosage Given to Date: 20 Gy
Reference Point Session Dosage Given: 2 Gy
Session Number: 10

## 2023-06-19 ENCOUNTER — Ambulatory Visit: Payer: No Typology Code available for payment source

## 2023-06-19 ENCOUNTER — Inpatient Hospital Stay: Payer: No Typology Code available for payment source

## 2023-06-19 ENCOUNTER — Inpatient Hospital Stay (HOSPITAL_BASED_OUTPATIENT_CLINIC_OR_DEPARTMENT_OTHER): Payer: No Typology Code available for payment source | Admitting: Oncology

## 2023-06-19 ENCOUNTER — Ambulatory Visit
Admission: RE | Admit: 2023-06-19 | Discharge: 2023-06-19 | Disposition: A | Discharge status: Home / Self Care | Payer: No Typology Code available for payment source | Source: Ambulatory Visit | Attending: Radiation Oncology | Admitting: Radiation Oncology

## 2023-06-19 ENCOUNTER — Other Ambulatory Visit: Payer: Self-pay

## 2023-06-19 ENCOUNTER — Ambulatory Visit
Admission: RE | Admit: 2023-06-19 | Discharge: 2023-06-19 | Disposition: A | Discharge status: Home / Self Care | Payer: No Typology Code available for payment source | Source: Ambulatory Visit | Attending: Oncology | Admitting: Oncology

## 2023-06-19 ENCOUNTER — Other Ambulatory Visit: Payer: No Typology Code available for payment source

## 2023-06-19 ENCOUNTER — Encounter: Payer: Self-pay | Admitting: Oncology

## 2023-06-19 VITALS — BP 130/92 | HR 110 | Temp 96.0°F | Resp 18 | Wt 189.6 lb

## 2023-06-19 VITALS — HR 109

## 2023-06-19 DIAGNOSIS — E876 Hypokalemia: Secondary | ICD-10-CM

## 2023-06-19 DIAGNOSIS — C787 Secondary malignant neoplasm of liver and intrahepatic bile duct: Secondary | ICD-10-CM | POA: Diagnosis not present

## 2023-06-19 DIAGNOSIS — C159 Malignant neoplasm of esophagus, unspecified: Secondary | ICD-10-CM | POA: Insufficient documentation

## 2023-06-19 DIAGNOSIS — Z5112 Encounter for antineoplastic immunotherapy: Secondary | ICD-10-CM | POA: Diagnosis not present

## 2023-06-19 DIAGNOSIS — C155 Malignant neoplasm of lower third of esophagus: Secondary | ICD-10-CM | POA: Diagnosis not present

## 2023-06-19 DIAGNOSIS — D500 Iron deficiency anemia secondary to blood loss (chronic): Secondary | ICD-10-CM

## 2023-06-19 DIAGNOSIS — R0602 Shortness of breath: Secondary | ICD-10-CM | POA: Insufficient documentation

## 2023-06-19 DIAGNOSIS — Z510 Encounter for antineoplastic radiation therapy: Secondary | ICD-10-CM | POA: Diagnosis not present

## 2023-06-19 DIAGNOSIS — Z5111 Encounter for antineoplastic chemotherapy: Secondary | ICD-10-CM

## 2023-06-19 DIAGNOSIS — I82621 Acute embolism and thrombosis of deep veins of right upper extremity: Secondary | ICD-10-CM | POA: Diagnosis not present

## 2023-06-19 DIAGNOSIS — J3489 Other specified disorders of nose and nasal sinuses: Secondary | ICD-10-CM

## 2023-06-19 DIAGNOSIS — R319 Hematuria, unspecified: Secondary | ICD-10-CM

## 2023-06-19 DIAGNOSIS — J984 Other disorders of lung: Secondary | ICD-10-CM

## 2023-06-19 DIAGNOSIS — T451X5A Adverse effect of antineoplastic and immunosuppressive drugs, initial encounter: Secondary | ICD-10-CM

## 2023-06-19 DIAGNOSIS — C7951 Secondary malignant neoplasm of bone: Secondary | ICD-10-CM | POA: Diagnosis not present

## 2023-06-19 DIAGNOSIS — G620 Drug-induced polyneuropathy: Secondary | ICD-10-CM

## 2023-06-19 DIAGNOSIS — R918 Other nonspecific abnormal finding of lung field: Secondary | ICD-10-CM | POA: Diagnosis not present

## 2023-06-19 LAB — RAD ONC ARIA SESSION SUMMARY
Course Elapsed Days: 14
Plan Fractions Treated to Date: 11
Plan Prescribed Dose Per Fraction: 2 Gy
Plan Total Fractions Prescribed: 15
Plan Total Prescribed Dose: 30 Gy
Reference Point Dosage Given to Date: 22 Gy
Reference Point Session Dosage Given: 2 Gy
Session Number: 11

## 2023-06-19 LAB — URINALYSIS, COMPLETE (UACMP) WITH MICROSCOPIC
Bilirubin Urine: NEGATIVE
Glucose, UA: NEGATIVE mg/dL
Ketones, ur: NEGATIVE mg/dL
Leukocytes,Ua: NEGATIVE
Nitrite: NEGATIVE
Protein, ur: 100 mg/dL — AB
RBC / HPF: >50 RBC/hpf (ref 0–5)
Specific Gravity, Urine: 1.021 (ref 1.005–1.030)
Squamous Epithelial / HPF: 0 /[HPF] (ref 0–5)
pH: 7 (ref 5.0–8.0)

## 2023-06-19 LAB — CBC WITH DIFFERENTIAL (CANCER CENTER ONLY)
Abs Immature Granulocytes: 0.01 10*3/uL (ref 0.00–0.07)
Basophils Absolute: 0.1 10*3/uL (ref 0.0–0.1)
Basophils Relative: 2 %
Eosinophils Absolute: 0 10*3/uL (ref 0.0–0.5)
Eosinophils Relative: 1 %
HCT: 35.7 % — ABNORMAL LOW (ref 39.0–52.0)
Hemoglobin: 11.1 g/dL — ABNORMAL LOW (ref 13.0–17.0)
Immature Granulocytes: 0 %
Lymphocytes Relative: 26 %
Lymphs Abs: 0.6 10*3/uL — ABNORMAL LOW (ref 0.7–4.0)
MCH: 27.5 pg (ref 26.0–34.0)
MCHC: 31.1 g/dL (ref 30.0–36.0)
MCV: 88.6 fL (ref 80.0–100.0)
Monocytes Absolute: 0.2 10*3/uL (ref 0.1–1.0)
Monocytes Relative: 9 %
Neutro Abs: 1.5 10*3/uL — ABNORMAL LOW (ref 1.7–7.7)
Neutrophils Relative %: 62 %
Platelet Count: 270 10*3/uL (ref 150–400)
RBC: 4.03 MIL/uL — ABNORMAL LOW (ref 4.22–5.81)
RDW: 18.1 % — ABNORMAL HIGH (ref 11.5–15.5)
WBC Count: 2.3 10*3/uL — ABNORMAL LOW (ref 4.0–10.5)
nRBC: 0 % (ref 0.0–0.2)

## 2023-06-19 LAB — CMP (CANCER CENTER ONLY)
ALT: 24 U/L (ref 0–44)
AST: 27 U/L (ref 15–41)
Albumin: 3.6 g/dL (ref 3.5–5.0)
Alkaline Phosphatase: 61 U/L (ref 38–126)
Anion gap: 15 (ref 5–15)
BUN: 12 mg/dL (ref 8–23)
CO2: 24 mmol/L (ref 22–32)
Calcium: 9.4 mg/dL (ref 8.9–10.3)
Chloride: 102 mmol/L (ref 98–111)
Creatinine: 0.93 mg/dL (ref 0.61–1.24)
GFR, Estimated: >60 mL/min (ref >60)
Glucose, Bld: 115 mg/dL — ABNORMAL HIGH (ref 70–99)
Potassium: 3.4 mmol/L — ABNORMAL LOW (ref 3.5–5.1)
Sodium: 141 mmol/L (ref 135–145)
Total Bilirubin: 0.7 mg/dL (ref <1.2)
Total Protein: 7.3 g/dL (ref 6.5–8.1)

## 2023-06-19 LAB — RESPIRATORY PANEL BY PCR

## 2023-06-19 MED ORDER — ONDANSETRON HCL 8 MG PO TABS: 8.0000 mg | ORAL_TABLET | Freq: Three times a day (TID) | ORAL | 1 refills | Status: DC | PRN

## 2023-06-19 MED ORDER — SODIUM CHLORIDE 0.9 % IV SOLN
Freq: Once | INTRAVENOUS | Status: DC
  Filled 2023-06-19: qty 250

## 2023-06-19 MED ORDER — FAMOTIDINE IN NACL 20-0.9 MG/50ML-% IV SOLN: 20.0000 mg | Freq: Once | INTRAVENOUS | Status: DC

## 2023-06-19 MED ORDER — PREDNISONE 20 MG PO TABS: 40.0000 mg | ORAL_TABLET | Freq: Every day | ORAL | 0 refills | Status: DC

## 2023-06-19 MED ORDER — HEPARIN SOD (PORK) LOCK FLUSH 100 UNIT/ML IV SOLN
500.0000 [IU] | Freq: Once | INTRAVENOUS | Status: AC | PRN
  Administered 2023-06-19: 500 [IU]
  Filled 2023-06-19: qty 5

## 2023-06-19 MED ORDER — DEXAMETHASONE SODIUM PHOSPHATE 10 MG/ML IJ SOLN: 10.0000 mg | Freq: Once | INTRAMUSCULAR | Status: DC

## 2023-06-19 MED ORDER — SODIUM CHLORIDE 0.9 % IV SOLN: 8.0000 mg/kg | Freq: Once | INTRAVENOUS | Status: DC

## 2023-06-19 MED ORDER — DIPHENHYDRAMINE HCL 50 MG/ML IJ SOLN: 50.0000 mg | Freq: Once | INTRAMUSCULAR | Status: DC

## 2023-06-19 MED ORDER — PROCHLORPERAZINE MALEATE 10 MG PO TABS: 10.0000 mg | ORAL_TABLET | Freq: Four times a day (QID) | ORAL | 1 refills | Status: DC | PRN

## 2023-06-19 MED ORDER — IOHEXOL 350 MG/ML SOLN
100.0000 mL | Freq: Once | INTRAVENOUS | Status: AC | PRN
  Administered 2023-06-19: 100 mL via INTRAVENOUS

## 2023-06-19 MED ORDER — PACLITAXEL CHEMO INJECTION 300 MG/50ML: 80.0000 mg/m2 | Freq: Once | INTRAVENOUS | Status: DC

## 2023-06-19 NOTE — Assessment & Plan Note (Signed)
Check UA, urine culture.

## 2023-06-19 NOTE — Assessment & Plan Note (Signed)
Grade 2, he prefers to hold of neuropathy treatments.  observation.

## 2023-06-19 NOTE — Assessment & Plan Note (Signed)
Lab Results  Component Value Date   HGB 11.1 (L) 06/19/2023   TIBC 353 04/25/2023   IRONPCTSAT 35 04/25/2023   FERRITIN 357 (H) 04/25/2023  Ferritin maybe falsely elevated . Anemia is likely due to chemotherapy and possible iron deficiency.  Hb has improved. Retic panel is improved.

## 2023-06-19 NOTE — Progress Notes (Signed)
Nutrition Follow-up:   Patient with esophageal cancer.  Receiving paclitaxel and ramucirumab but holding today.    Met with patient and decreased appetite.  Takes a few bites and feels full. Intake has been decreased  for the last few months.  Has increased secretions, pneumonitis.  Planning CT of chest to rule out PET today.  Also noticed blood in urine.  MD aware and planning work up.      Medications: glycopyrrolate.    Labs: reviewed  Anthropometrics:   Weight 189 lb 9.6 oz today  198 lb 12.8 oz on 11/7 217 lb 14.4 oz on 9/27 219 lb on 9/6 220 lb on 8/14 223 lb on 7/31 236 lb on 5/13 265 lb on 1/31  14% weight loss in the last 3 months, significant   NUTRITION DIAGNOSIS: Inadequate oral intake ongoing  MALNUTRITION DIAGNOSIS:    INTERVENTION:  Asking about protein powders.  Samples of unjury given along with carnation breakfast essentials packets.  On prednisone currently, may increase appetite    MONITORING, EVALUATION, GOAL: weight trends, intake   NEXT VISIT: Thursday, Dec 5 during infusion  Haley Roza B. Freida Busman, RD, LDN Registered Dietitian [phone]

## 2023-06-19 NOTE — Assessment & Plan Note (Signed)
Chronic hypokalemia Cotinue KCL to BID. Marland Kitchen

## 2023-06-19 NOTE — Assessment & Plan Note (Signed)
Off Eliquis 2.5mg  BID due to GI bleeding

## 2023-06-19 NOTE — Progress Notes (Signed)
1107: Pt returns from bathroom, stating that there was red blood in his urine and he believes this started last night. Per Dr. Cathie Hoops cancel/hold treatment today and collect a UA and a urine culture.  Pt aware.  1215: pt sent to medical mall for CT. Pt stable at discharge.

## 2023-06-19 NOTE — Assessment & Plan Note (Addendum)
Due to radiation/immunotherapy.  SOB cough is worse.  continue prednisone, I recommend to increase prednisone 40mg  daily

## 2023-06-19 NOTE — Assessment & Plan Note (Signed)
Chemotherapy plan as listed above 

## 2023-06-19 NOTE — Assessment & Plan Note (Signed)
Probably due to pneumonitis. Recommend him to increase steroid.  Given that he has metastatic disease, history of DVT not on anticoagulation due to recent GI bleeding, I will obtain CT chest angiogram PE protocol to rule out PE.

## 2023-06-19 NOTE — Progress Notes (Signed)
Hematology/Oncology Progress note [phone] Fax:([phone]) [phone]       CHIEF COMPLAINTS/PURPOSE OF CONSULTATION:  Stage IV Esophageal adenocarcinoma  ASSESSMENT & PLAN:   Cancer Staging  Adenocarcinoma of esophagus [facility]) Staging form: Esophagus - Adenocarcinoma, AJCC 8th Edition - Clinical stage from 08/28/2022: Stage IVB (cT3, cN1, cM1) - Signed by Rickard Patience, MD on 10/04/2022   Adenocarcinoma of esophagus (HCC) Stage IV esophageal adenocarcinoma, liver metastatic disease, HER 2 negative. KRAS G12V, TMB 5.3, MSI Stable.  CPS 65%  1st line  FOLFOX , palliative RT --> 12/2022 CT progression--> switch to 5-FU + Nivolumab Q2 weeks  --> 02/2023 PET progression- pneumonitis--> 03/27/23  3rd line  Taxol and Cyramza --> 10/4 CT during admission showed slight decrease of liver lesion size. --> 10/30 PET obtained by radonc -liver lesion 10cm, bone mets. ? Progression.  Labs are reviewed and discussed with patient. hold C3 D15 Taxol and Cytamza - due to tachycardia and hematuria Refer to [facility] for expert opinion. He declined.  Follow up with palliative care  GI bleeding due to esophageal cancer,  palliative RT Liver mass was slightly smaller [8cm] on 05/02/2023,  size appears to be bigger on PET done on 05/28/23. Will repeat CT abdomen pelvis to clarify.   Hypokalemia Chronic hypokalemia Cotinue KCL to BID. Marland Kitchen      Encounter for antineoplastic chemotherapy Chemotherapy plan as listed above.   Deep venous thrombosis (HCC) Off Eliquis 2.5mg  BID due to GI bleeding  Chemotherapy-induced neuropathy (HCC) Grade 2, he prefers to hold of neuropathy treatments.  observation.   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  SOB cough is worse.  continue prednisone, I recommend to increase prednisone 40mg  daily     IDA (iron deficiency anemia) Lab Results  Component Value Date   HGB 11.1 (L) 06/19/2023   TIBC 353 04/25/2023   IRONPCTSAT 35 04/25/2023   FERRITIN 357 (H)  04/25/2023  Ferritin maybe falsely elevated . Anemia is likely due to chemotherapy and possible iron deficiency.  Hb has improved. Retic panel is improved.  SOB (shortness of breath) Probably due to pneumonitis. Recommend him to increase steroid.  Given that he has metastatic disease, history of DVT not on anticoagulation due to recent GI bleeding, I will obtain CT chest angiogram PE protocol to rule out PE.   Hematuria Check UA, urine culture.    Orders Placed This Encounter  Procedures   Respiratory (~20 pathogens) panel by PCR    Standing Status:   Future    Number of Occurrences:   1    Standing Expiration Date:   06/18/2024   CT Angio Chest Pulmonary Embolism (PE) W or WO Contrast    Standing Status:   Future    Number of Occurrences:   1    Standing Expiration Date:   06/18/2024    Order Specific Question:   If indicated for the ordered procedure, I authorize the administration of contrast media per Radiology protocol    Answer:   Yes    Order Specific Question:   Does the patient have a contrast media/X-ray dye allergy?    Answer:   No    Order Specific Question:   Preferred imaging location?    Answer:   [facility]   CT ABDOMEN PELVIS W CONTRAST    Standing Status:   Future    Number of Occurrences:   1    Standing Expiration Date:   06/18/2024    Order Specific Question:   If indicated for  the ordered procedure, I authorize the administration of contrast media per Radiology protocol    Answer:   Yes    Order Specific Question:   Does the patient have a contrast media/X-ray dye allergy?    Answer:   No    Order Specific Question:   Preferred imaging location?    Answer:   [facility]    Order Specific Question:   If indicated for the ordered procedure, I authorize the administration of oral contrast media per Radiology protocol    Answer:   Yes    Follow-up LOS  All questions were answered. The patient knows to call the clinic with any problems,  questions or concerns.  Rickard Patience, MD, PhD [facility] Hematology Oncology 06/19/2023   HISTORY OF PRESENTING ILLNESS:  Jonathan [name] 67 y.o. male presents to establish care for esophageal adenocarcinoma I have reviewed his chart and materials related to his cancer extensively and collaborated history with the patient. Summary of oncologic history is as follows: Oncology History  Adenocarcinoma of esophagus (HCC)  08/28/2022 Initial Diagnosis   Adenocarcinoma of esophagus   -Patient has noticed worsening of "food stuck/fullness" sensation since November 2023.  Patient had a barium swallow study which commented on marked mucosal irregularity in the distal esophagus with Broaddus base mural filling defect highly suspicious for malignancy.  Patient establish care with gastroenterology. -08/23/2022, EGD showed gastritis and partially obstructing malignant esophageal tumor in the lower third of the esophagus. Esophagus mass biopsy showed adenocarcinoma.  PD-L1 TPS 65%, HER2 negative.  [facility] NGS showed KRASG12V, CDKN2A, ARID1A, TP53, TMB 5.3, MSI stable.  Stomach biopsy showed gastric mucosa with no specific histology abnormality.  No significant intestinal metaplastic, dysplastic, granular atrophy or increased inflammation.     08/28/2022 Imaging   CT chest abdomen pelvis with contrast showed 1. Distal esophageal primary with gastrohepatic ligament nodal metastasis. 2. 2 right-sided pulmonary nodules, the largest of which measures 5 mm and is new since 2015. Pulmonary metastasis not be excluded. 3. Anterior right lower lobe volume loss and minimal soft tissue density, favoring atelectasis or scar. Recommend attention on follow-up. 4. Hepatic steatosis 5. Cholelithiasis 6. Left nephrolithiasis 7. Coronary artery atherosclerosis. Aortic Atherosclerosis   08/28/2022 Cancer Staging   Staging form: Esophagus - Adenocarcinoma, AJCC 8th Edition - Clinical stage from 08/28/2022: Stage IVB (cT3,  cN1, cM1) - Signed by Rickard Patience, MD on 10/04/2022 Stage prefix: Initial diagnosis   09/06/2022 Imaging   PET scan showed 1. Esophageal primary with gastrohepatic ligament nodal metastasis,as on CT. 2. Focus of hypermetabolism which is favored to registered to the posterior hepatic dome, in the region of subtle heterogeneity on prior diagnostic CT. Suboptimally evaluated secondary to underlying steatosis. Recommend further evaluation with pre and post contrast abdominal MRI to confirm probable metastasis. 3. Incidental findings, including: Left nephrolithiasis.Cholelithiasis. Coronary artery atherosclerosis. Aortic Atherosclerosis    09/14/2022 - 09/14/2022 Chemotherapy   Patient is on Treatment Plan : ESOPHAGUS Carboplatin + Paclitaxel Weekly X 6 Weeks with XRT     09/23/2022 Imaging   MRI abdomen with and without contrast showed 1. Mildly T2 hyperintense segment VII hepatic lesion measuring 2.7 cm with imaging characteristics compatible with metastatic disease. 2. Tiny focus of delayed enhancement in the inferior right lobe of the liver segment VI measuring 8 mm with ill-defined increased T2 signal and subtle corresponding reduced diffusivity, also suspicious for metastatic disease. 3. Partially visualized distal esophageal wall thickening compatible with the patient's known primary neoplasm. 4. Similar size of  the 11 mm gastrohepatic ligament lymph node mildly metabolic on prior PET-CT and compatible with local nodal disease involvement. 5. 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 Recommend follow up pre and post-contrast MRI/MRCP in 1 year. 6. Diffuse hepatic steatosis.   10/10/2022 Procedure   LIVER MASS; CT-GUIDED BIOPSY:  - MODERATE TO POORLY DIFFERENTIATED ADENOCARCINOMA MORPHOLOGICALLY  CONSISTENT WITH METASTASIS FROM PATIENT'S KNOWN ESOPHAGEAL  ADENOCARCINOMA.    10/17/2022 Procedure   Medi port  placed by Dr. Wyn Quaker   10/21/2022 - 01/15/2023 Chemotherapy   Patient is on Treatment Plan : ESOPHAGEAL ADENOCARCINOMA FOLFOX q14d x 6 cycles     01/15/2023 Imaging   PET showed  1. Interval progression in metastatic esophageal carcinoma as evidenced by hypermetabolic lymph nodes in the neck, chest, abdomen and pelvis, an enlarging right hepatic lobe metastasis, new bilateral adrenal metastases and new osseous metastases. 2. Cholelithiasis. 3. Left renal stones. 4. Aortic atherosclerosis (ICD10-I70.0). Coronary artery calcification.    01/27/2023 - 03/14/2023 Chemotherapy   Patient is on Treatment Plan : GASTROESOPHAGEAL FOLFOX + Nivolumab q14d     03/20/2023 Imaging   CT chest abdomen pelvis w contrast showed 1. Interval progression of metastatic disease with multiple new and enlarged hypodense lesions throughout the liver. 2. Significant enlargement of bilateral adrenal metastases. 3. Subtle sclerosis of an osseous metastasis of the right femoral neck. Other previously FDG avid osseous metastases of the left femoral neck and right sixth rib not appreciated by CT. 4. No persistently enlarged lymph nodes. 5. Interval development of bandlike consolidation and fibrosis of the paramedian right lung, particularly of the right lower lobe, as well as additional scattered irregular opacities throughout the right lung. 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 6. Similar circumferential wall thickening throughout the mid to lower esophagus, consistent with known primary esophageal adenocarcinoma. 7. Nonobstructive bilateral nephrolithiasis. Aortic Atherosclerosis   03/27/2023 -  Chemotherapy   Patient is on Treatment Plan : GASTROESOPHAGEAL Ramucirumab D1, 15 + Paclitaxel D1,8,15 q28d     05/02/2023 - 05/08/2023 Hospital Admission   Admission due to coffee-ground emesis. EGD 10/7 showed bleeding from esophageal cancer, esophagitis due to chemo and radiation.  Applied Hemospray. Eliquis was stopped. Acute blood loss anemia, received IV venofer treatments x 3 doses    Patient presents to establish care.  He is not taking PPI. He has intentionally lost some weight. Family history positive for father and paternal uncle with prostate cancer and sister with breast cancer. Denies any routine alcohol use  Right interval jugular vein occlusive DVT, started on Eliquis starter package on 10/28/2022, swelling of neck has improved.   CXR showed Interval development of bilateral perihilar interstitial opacities  He was treated with Azithromycin and Doxycycline to cover atypical infections.   INTERVAL HISTORY RALPHIE OSTLING is a 67 y.o. male who has above history reviewed by me today presents for follow up visit for Stage IV esophageal adenocarcinoma.  No additionally hematemesis episode.  He continues to have severe nasal congestion, postnatal drip, mucus production which causes nausea.  His appetite is not good and he has lost weight.  During the interval his SOB/cough is worse, so he increased his prednisone to 20mg  daily for a week and currently he takes 10mg  daily.  He takes PPI.  + neuropathy of finger tips, toes/bottom of feet. "Rubbery"  He has started on palliative radiation to esophageal mass.  When he was  at infusion center, he mentioned to RN that he noticed gross hematuria.    MEDICAL HISTORY:  Past Medical History:  Diagnosis Date   Arthritis    Complication of anesthesia    OCCURRED ONCE YEARS AGO 1981   Esophageal mass    Hypertension    Hypothyroidism    PONV (postoperative nausea and vomiting)     SURGICAL HISTORY: Past Surgical History:  Procedure Laterality Date   COLONOSCOPY     ESOPHAGOGASTRODUODENOSCOPY N/A 08/23/2022   Procedure: ESOPHAGOGASTRODUODENOSCOPY (EGD);  Surgeon: Jaynie Collins, DO;  Location: [facility] ENDOSCOPY;  Service: Gastroenterology;  Laterality: N/A;   ESOPHAGOGASTRODUODENOSCOPY (EGD) WITH PROPOFOL  N/A 05/05/2023   Procedure: ESOPHAGOGASTRODUODENOSCOPY (EGD) WITH PROPOFOL;  Surgeon: Jaynie Collins, DO;  Location[facility] ENDOSCOPY;  Service: Gastroenterology;  Laterality: N/A;   EUS N/A 09/12/2022   Procedure: FULL UPPER ENDOSCOPIC ULTRASOUND (EUS) RADIAL;  Surgeon: Bearl Mulberry, MD;  Location[facility] ENDOSCOPY;  Service: Gastroenterology;  Laterality: N/A;   FINGER SURGERY     HEMOSTASIS CONTROL  05/05/2023   Procedure: HEMOSTASIS CONTROL;  Surgeon: Jaynie Collins, DO;  Location: [facility] ENDOSCOPY;  Service: Gastroenterology;;   PORTA CATH INSERTION N/A 10/17/2022   Procedure: PORTA CATH INSERTION;  Surgeon: Annice Needy, MD;  Location: [facility] INVASIVE CV LAB;  Service: Cardiovascular;  Laterality: N/A;   TENDON REPAIR IN LEFT KNEE      SOCIAL HISTORY: Social History   Socioeconomic History   Marital status: Single    Spouse name: Not on file   Number of children: Not on file   Years of education: Not on file   Highest education level: Not on file  Occupational History   Not on file  Tobacco Use   Smoking status: Never   Smokeless tobacco: Never  Vaping Use   Vaping status: Never Used  Substance and Sexual Activity   Alcohol use: Yes    Comment: OCCASIONALLY   Drug use: Never   Sexual activity: Not on file  Other Topics Concern   Not on file  Social History Narrative   Not on file   Social Determinants of Health   Financial Resource Strain: Low Risk  (10/29/2022)   Received from [facility]   Overall Financial Resource Strain (CARDIA)    Difficulty of Paying Living Expenses: Not hard at all  Food Insecurity: No Food Insecurity (05/30/2023)   Hunger Vital Sign    Worried About Running Out of Food in the Last Year: Never true    Ran Out of Food in the Last Year: Never true  Transportation Needs: No Transportation Needs (05/30/2023)   PRAPARE - Administrator, Civil Service (Medical): No    Lack of  Transportation (Non-Medical): No  Physical Activity: Not on file  Stress: Not on file  Social Connections: Not on file  Intimate Partner Violence: Not At Risk (05/30/2023)   Humiliation, Afraid, Rape, and Kick questionnaire    Fear of Current or Ex-Partner: No    Emotionally Abused: No    Physically Abused: No    Sexually Abused: No    FAMILY HISTORY: Family History  Problem Relation Age of Onset   Heart attack Mother    Prostate cancer Father    Breast cancer Sister    Prostate cancer Paternal Uncle     ALLERGIES:  has No Known Allergies.  MEDICATIONS:  Current Outpatient Medications  Medication Sig Dispense Refill   acetaminophen (TYLENOL) 650 MG CR  tablet Take 1,300 mg by mouth every 8 (eight) hours as needed for pain.     azelastine (ASTELIN) 0.1 % nasal spray Place 1 spray into both nostrils 2 (two) times daily. Use in each nostril as directed 30 mL 1   benzonatate (TESSALON) 200 MG capsule Take 1 capsule (200 mg total) by mouth 3 (three) times daily as needed for cough. 20 capsule 5   chlorhexidine (PERIDEX) 0.12 % solution USE AS DIRECTED TAKE  15  ML  IN  THE  MOUTH  OR  THROAT  TWICE  DAILY 473 mL 0   Cholecalciferol (VITAMIN D3) 125 MCG (5000 UT) CAPS Take 5,000 Units by mouth daily.     glycopyrrolate (ROBINUL) 1 MG tablet Take 0.5 tablets (0.5 mg total) by mouth 2 (two) times daily as needed (excess secretions). 30 tablet 0   levothyroxine (SYNTHROID) 125 MCG tablet Take 125 mcg by mouth every morning.     Multiple Vitamins-Minerals (MULTIVITAMIN WITH MINERALS) tablet Take 1 tablet by mouth daily.     pantoprazole (PROTONIX) 40 MG tablet Take 1 tablet (40 mg total) by mouth 2 (two) times daily. 60 tablet 1   potassium chloride SA (KLOR-CON M) 20 MEQ tablet Take 2 tablets (40 mEq total) by mouth 2 (two) times daily. 60 tablet 1   predniSONE (DELTASONE) 20 MG tablet Take 2 tablets (40 mg total) by mouth daily with breakfast. 60 tablet 0   ondansetron (ZOFRAN) 8 MG tablet  Take 1 tablet (8 mg total) by mouth every 8 (eight) hours as needed for nausea or vomiting. Start on the third day after chemotherapy. 90 tablet 1   prochlorperazine (COMPAZINE) 10 MG tablet Take 1 tablet (10 mg total) by mouth every 6 (six) hours as needed for nausea or vomiting. 90 tablet 1   No current facility-administered medications for this visit.    Review of Systems  Constitutional:  Negative for appetite change, chills, fatigue and fever.  HENT:   Negative for hearing loss and voice change.        Nasal congestion, postnasal drip.   Eyes:  Negative for eye problems and icterus.  Respiratory:  Negative for chest tightness, cough and shortness of breath.   Cardiovascular:  Negative for chest pain and leg swelling.  Gastrointestinal:  Negative for abdominal distention, abdominal pain, nausea and vomiting.  Endocrine: Negative for hot flashes.  Genitourinary:  Negative for difficulty urinating, dysuria and frequency.   Musculoskeletal:  Negative for arthralgias.  Skin:  Negative for itching and rash.  Neurological:  Positive for numbness. Negative for light-headedness.  Hematological:  Negative for adenopathy. Does not bruise/bleed easily.  Psychiatric/Behavioral:  Negative for confusion.      PHYSICAL EXAMINATION: ECOG PERFORMANCE STATUS: 0 - Asymptomatic  Vitals:   06/19/23 0956 06/19/23 1009  BP: (!) 134/99 (!) 130/92  Pulse: (!) 110   Resp: 18   Temp: (!) 96 F (35.6 C)   SpO2: 99%    Filed Weights   06/19/23 0956  Weight: 189 lb 9.6 oz (86 kg)    Physical Exam Constitutional:      General: He is not in acute distress.    Appearance: He is obese. He is not diaphoretic.  HENT:     Head: Normocephalic and atraumatic.  Eyes:     General: No scleral icterus. Cardiovascular:     Rate and Rhythm: Normal rate and regular rhythm.  Pulmonary:     Effort: Pulmonary effort is normal. No respiratory distress.  Breath sounds: No wheezing.  Abdominal:      General: There is no distension.     Palpations: Abdomen is soft.  Musculoskeletal:        General: Normal range of motion.     Cervical back: Normal range of motion and neck supple.  Skin:    General: Skin is warm and dry.     Findings: No erythema.  Neurological:     Mental Status: He is alert and oriented to person, place, and time. Mental status is at baseline.     Cranial Nerves: No cranial nerve deficit.     Motor: No abnormal muscle tone.  Psychiatric:        Mood and Affect: Mood and affect normal.      LABORATORY DATA:  I have reviewed the data as listed    Latest Ref Rng & Units 06/19/2023    9:42 AM 06/12/2023   12:34 PM 06/05/2023    8:00 AM  CBC  WBC 4.0 - 10.5 K/uL 2.3  7.9  5.1   Hemoglobin 13.0 - 17.0 g/dL 81.1  91.4  78.2   Hematocrit 39.0 - 52.0 % 35.7  36.6  36.3   Platelets 150 - 400 K/uL 270  302  274       Latest Ref Rng & Units 06/19/2023    9:42 AM 06/12/2023   12:34 PM 06/05/2023    8:00 AM  CMP  Glucose 70 - 99 mg/dL 956  213  94   BUN 8 - 23 mg/dL 12  14  9    Creatinine 0.61 - 1.24 mg/dL 0.86  5.78  4.69   Sodium 135 - 145 mmol/L 141  137  139   Potassium 3.5 - 5.1 mmol/L 3.4  3.3  3.8   Chloride 98 - 111 mmol/L 102  101  107   CO2 22 - 32 mmol/L 24  24  23    Calcium 8.9 - 10.3 mg/dL 9.4  9.4  9.5   Total Protein 6.5 - 8.1 g/dL 7.3  7.4  7.3   Total Bilirubin <1.2 mg/dL 0.7  0.7  0.5   Alkaline Phos 38 - 126 U/L 61  61  63   AST 15 - 41 U/L 27  24  24    ALT 0 - 44 U/L 24  20  21       RADIOGRAPHIC STUDIES: I have personally reviewed the radiological images as listed and agreed with the findings in the report. MR Brain W Wo Contrast  Result Date: 06/04/2023 CLINICAL DATA:  Brain metastases suspected EXAM: MRI HEAD WITHOUT AND WITH CONTRAST TECHNIQUE: Multiplanar, multiecho pulse sequences of the brain and surrounding structures were obtained without and with intravenous contrast. CONTRAST:  9mL GADAVIST GADOBUTROL 1 MMOL/ML IV SOLN  COMPARISON:  04/18/2021 MRI head IAC protocol, correlation is also made with 05/21/2023 PET-CT the FINDINGS: Brain: No restricted diffusion to suggest acute or subacute infarct. No abnormal parenchymal or meningeal enhancement. No acute hemorrhage, mass, mass effect, or midline shift. No hydrocephalus or extra-axial collection. Pituitary and craniocervical junction within normal limits. 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 Normal cerebral volume for age. Scattered T2 hyperintense signal in the periventricular white matter, likely the sequela of mild chronic small vessel ischemic disease. Vascular: Normal arterial flow voids. Normal arterial and venous enhancement. Skull and upper cervical spine: Normal marrow signal. Sinuses/Orbits: Clear paranasal sinuses. No  acute finding in the orbits. Other: Fluid in the left mastoid air cells. IMPRESSION: No acute intracranial process. No evidence of intracranial metastatic disease. Electronically Signed   By: Wiliam Ke M.D.   On: 06/04/2023 18:31   NM PET Image Initial (PI) Skull Base To Thigh (F-18 FDG)  Result Date: 05/28/2023 CLINICAL DATA:  Subsequent treatment strategy for metastatic esophageal cancer. EXAM: NUCLEAR MEDICINE PET SKULL BASE TO THIGH TECHNIQUE: 10.83 mCi F-18 FDG was injected intravenously. Full-ring PET imaging was performed from the skull base to thigh after the radiotracer. CT data was obtained and used for attenuation correction and anatomic localization. Fasting blood glucose: 79 mg/dl COMPARISON:  Multiple prior imaging studies. The most recent PET-CT is 01/08/2023 FINDINGS: Mediastinal blood pool activity: SUV max 2.24 Liver activity: SUV max NA NECK: No hypermetabolic lymph nodes in the neck. Possible hypermetabolic brain lesions. MRI suggested for further evaluation. Incidental CT findings: Mild symmetric  hypermetabolism both thyroid lobes likely due to thyroiditis. CHEST: Progressive hypermetabolism in the distal esophagus with SUV max of 9.8 a. findings consistent with progressive disease. Chronic medial right lower lobe atelectasis likely radiation change. Moderate hypermetabolism with SUV max 5.57 but no obvious tumor. No enlarged or hypermetabolic mediastinal hilar lymph nodes. No worrisome pulmonary nodules on the CT scan. Incidental CT findings: Stable age advanced vascular calcifications. ABDOMEN/PELVIS: Significant progression of metastatic hepatic disease. Large coalescence right hepatic lobe mass measuring approximately 10 cm with largely rim like hypermetabolism with SUV max of 12.44. Three small hypermetabolic lesions in the left hepatic lobe consistent with metastatic disease. The largest lesion measures 16 mm and SUV max is 6.96. There is also a small lesion in segment 6 with SUV max of 8.61. Enlarging right adrenal gland mass measuring 3.9 x 3.1 cm (previous 2.5 x 2.3 cm). SUV max is 10.39 (previously 4.6). FDG uptake in the anorectal area with SUV max of 7.55, similar to prior study and likely physiologic. Internal hemorrhoids are also possible. No enlarged or hypermetabolic abdominal or pelvic lymph nodes. Incidental CT findings: Stable gallstones, renal calculi and vascular calcifications. SKELETON: New hypermetabolic bone lesions are noted in the T12 and L2 vertebral bodies. T12 lesion has an SUV max of 6.87 and L1 lesion has an SUV max of 10.47 vaguely lytic lesions seen on the CT scan. There is also a hypermetabolic lesion in the left sacrum with SUV max of 5.06. Small hypermetabolic right sixth posterior rib lesion has an SUV max of 6.04. Incidental CT findings: None. IMPRESSION: 1. Progressive hypermetabolism in the distal esophagus consistent with progressive disease. 2. Significant progression of metastatic hepatic disease (new and enlarging lesions). 3. Enlarging and progressively  hypermetabolic right adrenal gland mass. 4. New hypermetabolic bone lesions in the T12 and L2 vertebral bodies, left sacrum and right sixth posterior rib consistent with metastatic disease. 5. Possible hypermetabolic brain lesions. MRI suggested for further evaluation. 6. Chronic medial right lower lobe atelectasis likely radiation change. Moderate hypermetabolism but no obvious tumor. 7. Stable gallstones, renal calculi and vascular calcifications. Electronically Signed   By: Rudie Meyer M.D.   On: 05/28/2023 14:43

## 2023-06-19 NOTE — Assessment & Plan Note (Addendum)
Stage IV esophageal adenocarcinoma, liver metastatic disease, HER 2 negative. KRAS G12V, TMB 5.3, MSI Stable.  CPS 65%  1st line  FOLFOX , palliative RT --> 12/2022 CT progression--> switch to 5-FU + Nivolumab Q2 weeks  --> 02/2023 PET progression- pneumonitis--> 03/27/23  3rd line  Taxol and Cyramza --> 10/4 CT during admission showed slight decrease of liver lesion size. --> 10/30 PET obtained by radonc -liver lesion 10cm, bone mets. ? Progression.  Labs are reviewed and discussed with patient. hold C3 D15 Taxol and Cytamza - due to tachycardia and hematuria Refer to [facility] for expert opinion. He declined.  Follow up with palliative care  GI bleeding due to esophageal cancer,  palliative RT Liver mass was slightly smaller [8cm] on 05/02/2023,  size appears to be bigger on PET done on 05/28/23. Will repeat CT abdomen pelvis to clarify.   Addendum CT showed progressive disease. New liver lesion 2.5 cm, enlarging liver masses. Increased right adrenal mass.

## 2023-06-20 ENCOUNTER — Other Ambulatory Visit: Payer: Self-pay | Admitting: Oncology

## 2023-06-20 ENCOUNTER — Other Ambulatory Visit: Payer: No Typology Code available for payment source

## 2023-06-20 ENCOUNTER — Other Ambulatory Visit: Payer: Self-pay

## 2023-06-20 ENCOUNTER — Encounter: Payer: Self-pay | Admitting: Oncology

## 2023-06-20 ENCOUNTER — Ambulatory Visit: Payer: No Typology Code available for payment source | Admitting: Oncology

## 2023-06-20 ENCOUNTER — Ambulatory Visit: Payer: No Typology Code available for payment source

## 2023-06-20 ENCOUNTER — Ambulatory Visit
Admission: RE | Admit: 2023-06-20 | Discharge: 2023-06-20 | Disposition: A | Discharge status: Home / Self Care | Payer: No Typology Code available for payment source | Source: Ambulatory Visit | Attending: Radiation Oncology | Admitting: Radiation Oncology

## 2023-06-20 DIAGNOSIS — Z510 Encounter for antineoplastic radiation therapy: Secondary | ICD-10-CM | POA: Diagnosis not present

## 2023-06-20 DIAGNOSIS — C159 Malignant neoplasm of esophagus, unspecified: Secondary | ICD-10-CM

## 2023-06-20 DIAGNOSIS — C155 Malignant neoplasm of lower third of esophagus: Secondary | ICD-10-CM | POA: Diagnosis not present

## 2023-06-20 LAB — RAD ONC ARIA SESSION SUMMARY
Course Elapsed Days: 15
Plan Fractions Treated to Date: 12
Plan Prescribed Dose Per Fraction: 2 Gy
Plan Total Fractions Prescribed: 15
Plan Total Prescribed Dose: 30 Gy
Reference Point Dosage Given to Date: 24 Gy
Reference Point Session Dosage Given: 2 Gy
Session Number: 12

## 2023-06-20 LAB — URINE CULTURE: Culture: <10000 — AB

## 2023-06-20 MED ORDER — LOPERAMIDE HCL 2 MG PO CAPS: ORAL_CAPSULE | ORAL | 0 refills | Status: DC

## 2023-06-20 NOTE — Progress Notes (Signed)
DISCONTINUE ON PATHWAY REGIMEN - Gastroesophageal     A cycle is every 28 days:     Ramucirumab      Paclitaxel   **Always confirm dose/schedule in your pharmacy ordering system**  REASON: Disease Progression PRIOR TREATMENT: [id number]: Ramucirumab 8 mg/kg Days 1, 15 + Paclitaxel 80 mg/m2 Days 1, 8, 15 q28 Days Until Progression or Unacceptable Toxicity TREATMENT RESPONSE: Progressive Disease (PD)  START OFF PATHWAY REGIMEN - Gastroesophageal   [id number] (Leucovorin IV D1 + Fluorouracil IV D1/CIV D1,2 + Irinotecan IV D1) q14 Days:   A cycle is every 14 days:     Irinotecan      Leucovorin      Fluorouracil      Fluorouracil   **Always confirm dose/schedule in your pharmacy ordering system**  Patient Characteristics: Distant Metastases (cM1/pM1) / Locally Recurrent Disease, Adenocarcinoma - Esophageal, GE Junction, and Gastric, Third Line and Beyond, HER2 Negative/Unknown and MSS/pMMR or MSI Unknown Therapeutic Status: Distant Metastases (No Additional Staging) Histology: Adenocarcinoma Disease Classification: Esophageal 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

## 2023-06-23 ENCOUNTER — Ambulatory Visit
Admission: RE | Admit: 2023-06-23 | Discharge: 2023-06-23 | Disposition: A | Discharge status: Home / Self Care | Payer: No Typology Code available for payment source | Source: Ambulatory Visit | Attending: Radiation Oncology | Admitting: Radiation Oncology

## 2023-06-23 ENCOUNTER — Other Ambulatory Visit: Payer: Self-pay

## 2023-06-23 DIAGNOSIS — Z515 Encounter for palliative care: Secondary | ICD-10-CM | POA: Diagnosis not present

## 2023-06-23 DIAGNOSIS — Z008 Encounter for other general examination: Secondary | ICD-10-CM | POA: Diagnosis not present

## 2023-06-23 DIAGNOSIS — C155 Malignant neoplasm of lower third of esophagus: Secondary | ICD-10-CM | POA: Diagnosis not present

## 2023-06-23 DIAGNOSIS — Z510 Encounter for antineoplastic radiation therapy: Secondary | ICD-10-CM | POA: Diagnosis not present

## 2023-06-23 DIAGNOSIS — C158 Malignant neoplasm of overlapping sites of esophagus: Secondary | ICD-10-CM | POA: Diagnosis not present

## 2023-06-23 DIAGNOSIS — R630 Anorexia: Secondary | ICD-10-CM | POA: Diagnosis not present

## 2023-06-23 LAB — RAD ONC ARIA SESSION SUMMARY
Course Elapsed Days: 18
Plan Fractions Treated to Date: 13
Plan Prescribed Dose Per Fraction: 2 Gy
Plan Total Fractions Prescribed: 15
Plan Total Prescribed Dose: 30 Gy
Reference Point Dosage Given to Date: 26 Gy
Reference Point Session Dosage Given: 2 Gy
Session Number: 13

## 2023-06-23 NOTE — Progress Notes (Signed)
Pharmacist Chemotherapy Monitoring - Initial Assessment    Anticipated start date: 06/30/23   The following has been reviewed per standard work regarding the patient's treatment regimen: The patient's diagnosis, treatment plan and drug doses, and organ/hematologic function Lab orders and baseline tests specific to treatment regimen  The treatment plan start date, drug sequencing, and pre-medications Prior authorization status  Patient's documented medication list, including drug-drug interaction screen and prescriptions for anti-emetics and supportive care specific to the treatment regimen The drug concentrations, fluid compatibility, administration routes, and timing of the medications to be used The patient's access for treatment and lifetime cumulative dose history, if applicable  The patient's medication allergies and previous infusion related reactions, if applicable   Changes made to treatment plan:  N/A  Follow up needed:  N/A   Ebony Hail, Pharm.D., CPP 06/23/2023@3 :37 PM

## 2023-06-24 ENCOUNTER — Ambulatory Visit
Admission: RE | Admit: 2023-06-24 | Discharge: 2023-06-24 | Disposition: A | Discharge status: Home / Self Care | Payer: No Typology Code available for payment source | Source: Ambulatory Visit | Attending: Radiation Oncology | Admitting: Radiation Oncology

## 2023-06-24 ENCOUNTER — Other Ambulatory Visit: Payer: Self-pay

## 2023-06-24 DIAGNOSIS — C155 Malignant neoplasm of lower third of esophagus: Secondary | ICD-10-CM | POA: Diagnosis not present

## 2023-06-24 DIAGNOSIS — Z510 Encounter for antineoplastic radiation therapy: Secondary | ICD-10-CM | POA: Diagnosis not present

## 2023-06-24 LAB — RAD ONC ARIA SESSION SUMMARY
Course Elapsed Days: 19
Plan Fractions Treated to Date: 14
Plan Prescribed Dose Per Fraction: 2 Gy
Plan Total Fractions Prescribed: 15
Plan Total Prescribed Dose: 30 Gy
Reference Point Dosage Given to Date: 28 Gy
Reference Point Session Dosage Given: 2 Gy
Session Number: 14

## 2023-06-25 ENCOUNTER — Other Ambulatory Visit: Payer: Self-pay

## 2023-06-25 ENCOUNTER — Encounter: Payer: Self-pay | Admitting: Oncology

## 2023-06-25 ENCOUNTER — Ambulatory Visit
Admission: RE | Admit: 2023-06-25 | Discharge: 2023-06-25 | Discharge status: Home / Self Care | Payer: No Typology Code available for payment source | Source: Ambulatory Visit | Attending: Radiation Oncology | Admitting: Radiation Oncology

## 2023-06-25 DIAGNOSIS — C155 Malignant neoplasm of lower third of esophagus: Secondary | ICD-10-CM | POA: Diagnosis not present

## 2023-06-25 DIAGNOSIS — Z510 Encounter for antineoplastic radiation therapy: Secondary | ICD-10-CM | POA: Diagnosis not present

## 2023-06-25 LAB — RAD ONC ARIA SESSION SUMMARY
Course Elapsed Days: 20
Plan Fractions Treated to Date: 15
Plan Prescribed Dose Per Fraction: 2 Gy
Plan Total Fractions Prescribed: 15
Plan Total Prescribed Dose: 30 Gy
Reference Point Dosage Given to Date: 30 Gy
Reference Point Session Dosage Given: 2 Gy
Session Number: 15

## 2023-06-27 ENCOUNTER — Other Ambulatory Visit: Payer: No Typology Code available for payment source

## 2023-06-30 ENCOUNTER — Inpatient Hospital Stay: Payer: No Typology Code available for payment source

## 2023-06-30 ENCOUNTER — Inpatient Hospital Stay (HOSPITAL_BASED_OUTPATIENT_CLINIC_OR_DEPARTMENT_OTHER): Payer: No Typology Code available for payment source | Admitting: Oncology

## 2023-06-30 ENCOUNTER — Other Ambulatory Visit: Payer: No Typology Code available for payment source

## 2023-06-30 ENCOUNTER — Other Ambulatory Visit: Payer: Self-pay

## 2023-06-30 ENCOUNTER — Ambulatory Visit: Payer: No Typology Code available for payment source

## 2023-06-30 ENCOUNTER — Inpatient Hospital Stay: Payer: No Typology Code available for payment source | Attending: Oncology

## 2023-06-30 ENCOUNTER — Encounter: Payer: Self-pay | Admitting: Oncology

## 2023-06-30 VITALS — BP 119/92 | HR 109 | Temp 97.0°F | Resp 18 | Wt 188.0 lb

## 2023-06-30 DIAGNOSIS — K8020 Calculus of gallbladder without cholecystitis without obstruction: Secondary | ICD-10-CM | POA: Insufficient documentation

## 2023-06-30 DIAGNOSIS — I82621 Acute embolism and thrombosis of deep veins of right upper extremity: Secondary | ICD-10-CM | POA: Diagnosis not present

## 2023-06-30 DIAGNOSIS — C7931 Secondary malignant neoplasm of brain: Secondary | ICD-10-CM | POA: Insufficient documentation

## 2023-06-30 DIAGNOSIS — D62 Acute posthemorrhagic anemia: Secondary | ICD-10-CM | POA: Insufficient documentation

## 2023-06-30 DIAGNOSIS — J984 Other disorders of lung: Secondary | ICD-10-CM

## 2023-06-30 DIAGNOSIS — I700 Atherosclerosis of aorta: Secondary | ICD-10-CM | POA: Insufficient documentation

## 2023-06-30 DIAGNOSIS — K2091 Esophagitis, unspecified with bleeding: Secondary | ICD-10-CM | POA: Insufficient documentation

## 2023-06-30 DIAGNOSIS — G620 Drug-induced polyneuropathy: Secondary | ICD-10-CM

## 2023-06-30 DIAGNOSIS — K314 Gastric diverticulum: Secondary | ICD-10-CM | POA: Insufficient documentation

## 2023-06-30 DIAGNOSIS — E876 Hypokalemia: Secondary | ICD-10-CM | POA: Diagnosis not present

## 2023-06-30 DIAGNOSIS — I2510 Atherosclerotic heart disease of native coronary artery without angina pectoris: Secondary | ICD-10-CM | POA: Insufficient documentation

## 2023-06-30 DIAGNOSIS — N200 Calculus of kidney: Secondary | ICD-10-CM | POA: Diagnosis not present

## 2023-06-30 DIAGNOSIS — T451X5A Adverse effect of antineoplastic and immunosuppressive drugs, initial encounter: Secondary | ICD-10-CM | POA: Diagnosis not present

## 2023-06-30 DIAGNOSIS — C159 Malignant neoplasm of esophagus, unspecified: Secondary | ICD-10-CM | POA: Diagnosis not present

## 2023-06-30 DIAGNOSIS — Z5111 Encounter for antineoplastic chemotherapy: Secondary | ICD-10-CM | POA: Diagnosis not present

## 2023-06-30 DIAGNOSIS — J8410 Pulmonary fibrosis, unspecified: Secondary | ICD-10-CM | POA: Diagnosis not present

## 2023-06-30 DIAGNOSIS — Z86718 Personal history of other venous thrombosis and embolism: Secondary | ICD-10-CM | POA: Insufficient documentation

## 2023-06-30 DIAGNOSIS — J8489 Other specified interstitial pulmonary diseases: Secondary | ICD-10-CM | POA: Diagnosis not present

## 2023-06-30 DIAGNOSIS — C7972 Secondary malignant neoplasm of left adrenal gland: Secondary | ICD-10-CM | POA: Diagnosis not present

## 2023-06-30 DIAGNOSIS — Z79899 Other long term (current) drug therapy: Secondary | ICD-10-CM | POA: Insufficient documentation

## 2023-06-30 DIAGNOSIS — Z803 Family history of malignant neoplasm of breast: Secondary | ICD-10-CM | POA: Insufficient documentation

## 2023-06-30 DIAGNOSIS — C7951 Secondary malignant neoplasm of bone: Secondary | ICD-10-CM | POA: Insufficient documentation

## 2023-06-30 DIAGNOSIS — Z79890 Hormone replacement therapy (postmenopausal): Secondary | ICD-10-CM | POA: Insufficient documentation

## 2023-06-30 DIAGNOSIS — Z8042 Family history of malignant neoplasm of prostate: Secondary | ICD-10-CM | POA: Insufficient documentation

## 2023-06-30 DIAGNOSIS — K449 Diaphragmatic hernia without obstruction or gangrene: Secondary | ICD-10-CM | POA: Insufficient documentation

## 2023-06-30 DIAGNOSIS — K760 Fatty (change of) liver, not elsewhere classified: Secondary | ICD-10-CM | POA: Insufficient documentation

## 2023-06-30 DIAGNOSIS — M4316 Spondylolisthesis, lumbar region: Secondary | ICD-10-CM | POA: Insufficient documentation

## 2023-06-30 DIAGNOSIS — J479 Bronchiectasis, uncomplicated: Secondary | ICD-10-CM | POA: Diagnosis not present

## 2023-06-30 DIAGNOSIS — Z66 Do not resuscitate: Secondary | ICD-10-CM | POA: Diagnosis not present

## 2023-06-30 DIAGNOSIS — C7971 Secondary malignant neoplasm of right adrenal gland: Secondary | ICD-10-CM | POA: Diagnosis not present

## 2023-06-30 DIAGNOSIS — M48061 Spinal stenosis, lumbar region without neurogenic claudication: Secondary | ICD-10-CM | POA: Insufficient documentation

## 2023-06-30 DIAGNOSIS — Z7952 Long term (current) use of systemic steroids: Secondary | ICD-10-CM | POA: Insufficient documentation

## 2023-06-30 DIAGNOSIS — C7800 Secondary malignant neoplasm of unspecified lung: Secondary | ICD-10-CM | POA: Insufficient documentation

## 2023-06-30 DIAGNOSIS — M51369 Other intervertebral disc degeneration, lumbar region without mention of lumbar back pain or lower extremity pain: Secondary | ICD-10-CM | POA: Insufficient documentation

## 2023-06-30 DIAGNOSIS — K2970 Gastritis, unspecified, without bleeding: Secondary | ICD-10-CM | POA: Diagnosis not present

## 2023-06-30 DIAGNOSIS — C787 Secondary malignant neoplasm of liver and intrahepatic bile duct: Secondary | ICD-10-CM | POA: Diagnosis not present

## 2023-06-30 DIAGNOSIS — I444 Left anterior fascicular block: Secondary | ICD-10-CM | POA: Insufficient documentation

## 2023-06-30 DIAGNOSIS — Z8249 Family history of ischemic heart disease and other diseases of the circulatory system: Secondary | ICD-10-CM | POA: Insufficient documentation

## 2023-06-30 DIAGNOSIS — R000 Tachycardia, unspecified: Secondary | ICD-10-CM

## 2023-06-30 DIAGNOSIS — R9431 Abnormal electrocardiogram [ECG] [EKG]: Secondary | ICD-10-CM | POA: Diagnosis not present

## 2023-06-30 DIAGNOSIS — C155 Malignant neoplasm of lower third of esophagus: Secondary | ICD-10-CM | POA: Diagnosis not present

## 2023-06-30 DIAGNOSIS — I7121 Aneurysm of the ascending aorta, without rupture: Secondary | ICD-10-CM | POA: Insufficient documentation

## 2023-06-30 DIAGNOSIS — M47816 Spondylosis without myelopathy or radiculopathy, lumbar region: Secondary | ICD-10-CM | POA: Insufficient documentation

## 2023-06-30 LAB — CBC WITH DIFFERENTIAL (CANCER CENTER ONLY)
Abs Immature Granulocytes: 0.08 10*3/uL — ABNORMAL HIGH (ref 0.00–0.07)
Basophils Absolute: 0 10*3/uL (ref 0.0–0.1)
Basophils Relative: 0 %
Eosinophils Absolute: 0 10*3/uL (ref 0.0–0.5)
Eosinophils Relative: 0 %
HCT: 39.7 % (ref 39.0–52.0)
Hemoglobin: 12.2 g/dL — ABNORMAL LOW (ref 13.0–17.0)
Immature Granulocytes: 1 %
Lymphocytes Relative: 10 %
Lymphs Abs: 1.2 10*3/uL (ref 0.7–4.0)
MCH: 27.7 pg (ref 26.0–34.0)
MCHC: 30.7 g/dL (ref 30.0–36.0)
MCV: 90 fL (ref 80.0–100.0)
Monocytes Absolute: 1.1 10*3/uL — ABNORMAL HIGH (ref 0.1–1.0)
Monocytes Relative: 9 %
Neutro Abs: 9.2 10*3/uL — ABNORMAL HIGH (ref 1.7–7.7)
Neutrophils Relative %: 80 %
Platelet Count: 256 10*3/uL (ref 150–400)
RBC: 4.41 MIL/uL (ref 4.22–5.81)
RDW: 18.3 % — ABNORMAL HIGH (ref 11.5–15.5)
WBC Count: 11.6 10*3/uL — ABNORMAL HIGH (ref 4.0–10.5)
nRBC: 0 % (ref 0.0–0.2)

## 2023-06-30 LAB — CMP (CANCER CENTER ONLY)
ALT: 27 U/L (ref 0–44)
AST: 25 U/L (ref 15–41)
Albumin: 3.6 g/dL (ref 3.5–5.0)
Alkaline Phosphatase: 57 U/L (ref 38–126)
Anion gap: 12 (ref 5–15)
BUN: 18 mg/dL (ref 8–23)
CO2: 23 mmol/L (ref 22–32)
Calcium: 9.1 mg/dL (ref 8.9–10.3)
Chloride: 104 mmol/L (ref 98–111)
Creatinine: 1 mg/dL (ref 0.61–1.24)
GFR, Estimated: >60 mL/min (ref >60)
Glucose, Bld: 112 mg/dL — ABNORMAL HIGH (ref 70–99)
Potassium: 3.3 mmol/L — ABNORMAL LOW (ref 3.5–5.1)
Sodium: 139 mmol/L (ref 135–145)
Total Bilirubin: 0.5 mg/dL (ref <1.2)
Total Protein: 7.2 g/dL (ref 6.5–8.1)

## 2023-06-30 MED ORDER — DEXAMETHASONE SODIUM PHOSPHATE 10 MG/ML IJ SOLN
10.0000 mg | Freq: Once | INTRAMUSCULAR | Status: AC
Start: 2023-06-30 — End: 2023-06-30
  Administered 2023-06-30: 10 mg via INTRAVENOUS
  Filled 2023-06-30: qty 1

## 2023-06-30 MED ORDER — PALONOSETRON HCL INJECTION 0.25 MG/5ML
0.2500 mg | Freq: Once | INTRAVENOUS | Status: AC
  Administered 2023-06-30: 0.25 mg via INTRAVENOUS

## 2023-06-30 MED ORDER — SODIUM CHLORIDE 0.9 % IV SOLN
400.0000 mg/m2 | Freq: Once | INTRAVENOUS | Status: AC
  Administered 2023-06-30: 820 mg via INTRAVENOUS
  Filled 2023-06-30: qty 41

## 2023-06-30 MED ORDER — SODIUM CHLORIDE 0.9 % IV SOLN
150.0000 mg/m2 | Freq: Once | INTRAVENOUS | Status: AC
  Administered 2023-06-30: 300 mg via INTRAVENOUS
  Filled 2023-06-30: qty 15

## 2023-06-30 MED ORDER — ATROPINE SULFATE 1 MG/ML IV SOLN
0.5000 mg | Freq: Once | INTRAVENOUS | Status: AC
  Administered 2023-06-30: 0.5 mg via INTRAVENOUS
  Filled 2023-06-30: qty 1

## 2023-06-30 MED ORDER — STERILE WATER FOR INJECTION IJ SOLN
5.0000 mL | Freq: Four times a day (QID) | OROMUCOSAL | 1 refills | Status: DC | PRN
  Filled 2023-06-30: qty 480, 12d supply, fill #0

## 2023-06-30 MED ORDER — SODIUM CHLORIDE 0.9 % IV SOLN
2400.0000 mg/m2 | INTRAVENOUS | Status: DC
  Administered 2023-06-30: 5000 mg via INTRAVENOUS
  Filled 2023-06-30: qty 100

## 2023-06-30 MED ORDER — SODIUM CHLORIDE 0.9 % IV SOLN
INTRAVENOUS | Status: DC
  Filled 2023-06-30: qty 250

## 2023-06-30 NOTE — Assessment & Plan Note (Signed)
Chemotherapy plan as listed above 

## 2023-06-30 NOTE — Assessment & Plan Note (Addendum)
Due to radiation/immunotherapy.  SOB cough symptoms are improved.  continue prednisone 40mg  daily

## 2023-06-30 NOTE — Assessment & Plan Note (Addendum)
Stage IV esophageal adenocarcinoma, liver metastatic disease, HER 2 negative. KRAS G12V, TMB 5.3, MSI Stable.  CPS 65%  1st line  FOLFOX , palliative RT --> 12/2022 CT progression--> switch to 5-FU + Nivolumab Q2 weeks  --> 02/2023 PET progression- pneumonitis--> 03/27/23  3rd line  Taxol and Cyramza --> 10/4 CT during admission showed slight decrease of liver lesion size. --> 10/30 PET obtained by radonc -liver lesion 10cm, bone mets. ? Progression.  Refer to [facility] for expert opinion. He declined.  Follow up with palliative care  GI bleeding due to esophageal cancer,  palliative RT CT showed progressive disease. New liver lesion 2.5 cm, enlarging liver masses. Increased right adrenal mass. - disease progression. Switch to next line treatment, recommend FOLFIRI.  Rationale and side effects were reviewed with patient.  Patient agrees with the plan Labs are reviewed and discussed with patient. Proceed with FOLFIRI. Discussed instruction of Imodium

## 2023-06-30 NOTE — Assessment & Plan Note (Signed)
Off Eliquis 2.5mg  BID due to GI bleeding

## 2023-06-30 NOTE — Assessment & Plan Note (Signed)
Chronic hypokalemia Cotinue KCL to BID. Marland Kitchen

## 2023-06-30 NOTE — Progress Notes (Signed)
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 KRAS G12V, TMB 5.3, MSI Stable.  CPS 65%  1st line  FOLFOX , palliative RT --> 12/2022 CT progression--> switch to 5-FU + Nivolumab Q2 weeks  --> 02/2023 PET progression- pneumonitis--> 03/27/23  3rd line  Taxol and Cyramza --> 10/4 CT during admission showed slight decrease of liver lesion size. --> 10/30 PET obtained by radonc -liver lesion 10cm, bone mets. ? Progression.  Refer to [facility] for expert opinion. He declined.  Follow up with palliative care  GI bleeding due to esophageal cancer,  palliative RT CT showed progressive disease. New liver lesion 2.5 cm, enlarging liver masses. Increased right adrenal mass. - disease progression. Switch to next line treatment, recommend FOLFIRI.  Rationale and side effects were reviewed with patient.  Patient agrees with the plan Labs are reviewed and discussed with patient. Proceed with FOLFIRI. Discussed instruction of Imodium   Encounter for antineoplastic chemotherapy Chemotherapy plan as listed above.   Hypokalemia Chronic hypokalemia Cotinue KCL to BID. Marland Kitchen      Chemotherapy-induced neuropathy (HCC) Grade 2, he prefers to hold of neuropathy treatments.  observation.   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  SOB cough symptoms are improved.  continue prednisone 40mg  daily     Deep venous thrombosis (HCC) Off Eliquis 2.5mg  BID due to GI bleeding   Orders Placed This Encounter  Procedures   CEA    Standing  Status:   Future    Standing Expiration Date:   07/13/2024   CBC with Differential (Cancer Center Only)    Standing Status:   Future    Standing Expiration Date:   07/13/2024   CMP (Cancer Center only)    Standing Status:   Future    Standing Expiration Date:   07/13/2024   CEA    Standing Status:   Future    Standing Expiration Date:   07/27/2024   CBC with Differential (Cancer Center Only)    Standing Status:   Future    Standing Expiration Date:   07/27/2024   CMP (Cancer Center only)    Standing Status:   Future    Standing Expiration Date:   07/27/2024   CEA    Standing Status:   Future    Standing Expiration Date:   08/10/2024   CBC with Differential (Cancer Center Only)    Standing Status:   Future    Standing Expiration Date:   08/10/2024   CMP (Cancer Center only)    Standing Status:   Future    Standing Expiration Date:   08/10/2024   CBC with Differential (Cancer Center Only)    Standing Status:   Future    Standing Expiration Date:   06/29/2024   CMP (Cancer Center only)    Standing Status:   Future    Standing Expiration Date:   06/29/2024   EKG 12-Lead    Standing Status:   Future    Number of Occurrences:   1    Standing Expiration Date:   06/29/2024    Follow-up LOS  All questions were answered. The  patient knows to call the clinic with any problems, questions or concerns.  Rickard Patience, MD, PhD [facility] Hematology Oncology 06/30/2023   HISTORY OF PRESENTING ILLNESS:  Jonathan [name] 67 y.o. male presents to establish care for esophageal adenocarcinoma I have reviewed his chart and materials related to his cancer extensively and collaborated history with the patient. Summary of oncologic history is as follows: Oncology History  Adenocarcinoma of esophagus (HCC)  08/28/2022 Initial Diagnosis   Adenocarcinoma of esophagus   -Patient has noticed worsening of "food stuck/fullness" sensation since November 2023.  Patient had a barium swallow study which  commented on marked mucosal irregularity in the distal esophagus with Broaddus base mural filling defect highly suspicious for malignancy.  Patient establish care with gastroenterology. -08/23/2022, EGD showed gastritis and partially obstructing malignant esophageal tumor in the lower third of the esophagus. Esophagus mass biopsy showed adenocarcinoma.  PD-L1 TPS 65%, HER2 negative.  [facility] NGS showed KRASG12V, CDKN2A, ARID1A, TP53, TMB 5.3, MSI stable.  Stomach biopsy showed gastric mucosa with no specific histology abnormality.  No significant intestinal metaplastic, dysplastic, granular atrophy or increased inflammation.     08/28/2022 Imaging   CT chest abdomen pelvis with contrast showed 1. Distal esophageal primary with gastrohepatic ligament nodal metastasis. 2. 2 right-sided pulmonary nodules, the largest of which measures 5 mm and is new since 2015. Pulmonary metastasis not be excluded. 3. Anterior right lower lobe volume loss and minimal soft tissue density, favoring atelectasis or scar. Recommend attention on follow-up. 4. Hepatic steatosis 5. Cholelithiasis 6. Left nephrolithiasis 7. Coronary artery atherosclerosis. Aortic Atherosclerosis   08/28/2022 Cancer Staging   Staging form: Esophagus - Adenocarcinoma, AJCC 8th Edition - Clinical stage from 08/28/2022: Stage IVB (cT3, cN1, cM1) - Signed by Rickard Patience, MD on 10/04/2022 Stage prefix: Initial diagnosis   09/06/2022 Imaging   PET scan showed 1. Esophageal primary with gastrohepatic ligament nodal metastasis,as on CT. 2. Focus of hypermetabolism which is favored to registered to the posterior hepatic dome, in the region of subtle heterogeneity on prior diagnostic CT. Suboptimally evaluated secondary to underlying steatosis. Recommend further evaluation with pre and post contrast abdominal MRI to confirm probable metastasis. 3. Incidental findings, including: Left nephrolithiasis.Cholelithiasis. Coronary artery atherosclerosis. Aortic  Atherosclerosis    09/23/2022 Imaging   MRI abdomen with and without contrast showed 1. Mildly T2 hyperintense segment VII hepatic lesion measuring 2.7 cm with imaging characteristics compatible with metastatic disease. 2. Tiny focus of delayed enhancement in the inferior right lobe of the liver segment VI measuring 8 mm with ill-defined increased T2 signal and subtle corresponding reduced diffusivity, also suspicious for metastatic disease. 3. Partially visualized distal esophageal wall thickening compatible with the patient's known primary neoplasm. 4. Similar size of the 11 mm gastrohepatic ligament lymph node mildly metabolic on prior PET-CT and compatible with local nodal disease involvement. 5. 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 Recommend follow up pre and post-contrast MRI/MRCP in 1 year. 6. Diffuse hepatic steatosis.   10/10/2022 Procedure   LIVER MASS; CT-GUIDED BIOPSY:  - MODERATE TO POORLY DIFFERENTIATED ADENOCARCINOMA MORPHOLOGICALLY  CONSISTENT WITH METASTASIS FROM PATIENT'S KNOWN ESOPHAGEAL  ADENOCARCINOMA.    10/17/2022 Procedure   Medi port placed by Dr. Wyn Quaker   10/21/2022 - 01/15/2023 Chemotherapy   Patient is on Treatment Plan : ESOPHAGEAL ADENOCARCINOMA FOLFOX q14d x 6 cycles     01/15/2023 Imaging   PET showed  1. Interval progression in  metastatic esophageal carcinoma as evidenced by hypermetabolic lymph nodes in the neck, chest, abdomen and pelvis, an enlarging right hepatic lobe metastasis, new bilateral adrenal metastases and new osseous metastases. 2. Cholelithiasis. 3. Left renal stones. 4. Aortic atherosclerosis (ICD10-I70.0). Coronary artery calcification.    01/27/2023 - 03/14/2023 Chemotherapy   Patient is on Treatment Plan : GASTROESOPHAGEAL FOLFOX + Nivolumab q14d     03/20/2023 Imaging   CT chest abdomen pelvis w contrast showed 1. Interval progression of  metastatic disease with multiple new and enlarged hypodense lesions throughout the liver. 2. Significant enlargement of bilateral adrenal metastases. 3. Subtle sclerosis of an osseous metastasis of the right femoral neck. Other previously FDG avid osseous metastases of the left femoral neck and right sixth rib not appreciated by CT. 4. No persistently enlarged lymph nodes. 5. Interval development of bandlike consolidation and fibrosis of the paramedian right lung, particularly of the right lower lobe, as well as additional scattered irregular opacities throughout the right lung. 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 6. Similar circumferential wall thickening throughout the mid to lower esophagus, consistent with known primary esophageal adenocarcinoma. 7. Nonobstructive bilateral nephrolithiasis. Aortic Atherosclerosis   03/27/2023 - 06/19/2023 Chemotherapy   Patient is on Treatment Plan : GASTROESOPHAGEAL Ramucirumab D1, 15 + Paclitaxel D1,8,15 q28d     05/02/2023 - 05/08/2023 Hospital Admission   Admission due to coffee-ground emesis. EGD 10/7 showed bleeding from esophageal cancer, esophagitis due to chemo and radiation. Applied Hemospray. Eliquis was stopped. Acute blood loss anemia, received IV venofer treatments x 3 doses    05/28/2023 Imaging   1. Progressive hypermetabolism in the distal esophagus consistent with progressive disease. 2. Significant progression of metastatic hepatic disease (new and enlarging lesions). 3. Enlarging and progressively hypermetabolic right adrenal gland mass. 4. New hypermetabolic bone lesions in the T12 and L2 vertebral bodies, left sacrum and right sixth posterior rib consistent with metastatic disease. 5. Possible hypermetabolic brain lesions. MRI suggested for further evaluation. 6. Chronic medial right lower lobe atelectasis likely radiation change. Moderate hypermetabolism but no obvious tumor. 7. Stable  gallstones, renal calculi and vascular calcifications.   06/19/2023 Imaging   CT chest angiogram w contrast, CT abdomen pelvis w contrast  1. Negative for pulmonary embolus. 2. Persistent lower esophageal wall thickening with progressive hepatic and right adrenal metastatic disease, compatible with stage IV esophageal carcinoma, as on PET 05/21/2023. 3. 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  4. 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 5. 4.0 cm ascending aortic aneurysm. Recommend annual imaging follow up by CTA or MRA. This recommendation follows 2010 ACCF/AHA/AATS/ACR/ASA/SCA/SCAI/SIR/STS/SVM Guidelines for the Diagnosis and Management of Patients with Thoracic Aortic Disease.Circulation. 2010; 121: [id number]. Aortic aneurysm NOS (ICD10-I71.9). 6. Cholelithiasis. 7. Small left renal stone. 8. Aortic atherosclerosis (ICD10-I70.0). Left and descending coronary artery calcification.   06/30/2023 -  Chemotherapy   Patient is on Treatment Plan : COLORECTAL FOLFIRI q14d     Patient presents to establish care.  He is not taking PPI. He has intentionally lost some weight. Family history positive for father and paternal uncle with prostate cancer and sister with breast cancer. Denies any routine alcohol use  Right interval jugular vein occlusive DVT, started on Eliquis starter package on 10/28/2022, swelling of neck has improved.   CXR showed Interval development of bilateral perihilar interstitial opacities  He was treated with Azithromycin  and Doxycycline to cover atypical infections.   INTERVAL HISTORY Jonathan [name] is a 67 y.o. male who has above history reviewed by me today presents for follow up visit for Stage IV esophageal adenocarcinoma.  No additionally hematemesis episode.  He  continues to have severe nasal congestion, postnatal drip, mucus production which causes nausea.  His appetite is not good and he has lost weight.  During the interval his SOB/cough is better, currently on Prednisone 40mg  daily. He takes PPI.  + neuropathy of finger tips, toes/bottom of feet. "Rubbery"  He has started on palliative radiation to esophageal mass.  No additional episodes of Hematuria   MEDICAL HISTORY:  Past Medical History:  Diagnosis Date   Arthritis    Complication of anesthesia    OCCURRED ONCE YEARS AGO 1981   Esophageal mass    Hypertension    Hypothyroidism    PONV (postoperative nausea and vomiting)     SURGICAL HISTORY: Past Surgical History:  Procedure Laterality Date   COLONOSCOPY     ESOPHAGOGASTRODUODENOSCOPY N/A 08/23/2022   Procedure: ESOPHAGOGASTRODUODENOSCOPY (EGD);  Surgeon: Jaynie Collins, DO;  Location: [facility] ENDOSCOPY;  Service: Gastroenterology;  Laterality: N/A;   ESOPHAGOGASTRODUODENOSCOPY (EGD) WITH PROPOFOL N/A 05/05/2023   Procedure: ESOPHAGOGASTRODUODENOSCOPY (EGD) WITH PROPOFOL;  Surgeon: Jaynie Collins, DO;  Location: [facility] ENDOSCOPY;  Service: Gastroenterology;  Laterality: N/A;   EUS N/A 09/12/2022   Procedure: FULL UPPER ENDOSCOPIC ULTRASOUND (EUS) RADIAL;  Surgeon: Bearl Mulberry, MD;  Location: [facility] ENDOSCOPY;  Service: Gastroenterology;  Laterality: N/A;   FINGER SURGERY     HEMOSTASIS CONTROL  05/05/2023   Procedure: HEMOSTASIS CONTROL;  Surgeon: Jaynie Collins, DO;  Location: [facility] ENDOSCOPY;  Service: Gastroenterology;;   PORTA CATH INSERTION N/A 10/17/2022   Procedure: PORTA CATH INSERTION;  Surgeon: Annice Needy, MD;  Location: [facility] INVASIVE CV LAB;  Service: Cardiovascular;  Laterality: N/A;   TENDON REPAIR IN LEFT KNEE      SOCIAL HISTORY: Social History   Socioeconomic History   Marital status: Single    Spouse name: Not on file   Number of children: Not on file   Years of education: Not on file    Highest education level: Not on file  Occupational History   Not on file  Tobacco Use   Smoking status: Never   Smokeless tobacco: Never  Vaping Use   Vaping status: Never Used  Substance and Sexual Activity   Alcohol use: Yes    Comment: OCCASIONALLY   Drug use: Never   Sexual activity: Not on file  Other Topics Concern   Not on file  Social History Narrative   Not on file   Social Determinants of Health   Financial Resource Strain: Low Risk  (10/29/2022)   [facility]   Overall Financial Resource Strain (CARDIA)    Difficulty of Paying Living Expenses: Not hard at all  Food Insecurity: No Food Insecurity (05/30/2023)   Hunger Vital Sign    Worried About Running Out of Food in the Last Year: Never true    Ran Out of Food in the Last Year: Never true  Transportation Needs: No Transportation Needs (05/30/2023)   PRAPARE - Administrator, Civil Service (Medical): No    Lack of Transportation (Non-Medical): No  Physical Activity: Not on file  Stress: Not on file  Social Connections: Not on file  Intimate Partner Violence: Not At Risk (05/30/2023)   Humiliation, Afraid,  Rape, and Kick questionnaire    Fear of Current or Ex-Partner: No    Emotionally Abused: No    Physically Abused: No    Sexually Abused: No    FAMILY HISTORY: Family History  Problem Relation Age of Onset   Heart attack Mother    Prostate cancer Father    Breast cancer Sister    Prostate cancer Paternal Uncle     ALLERGIES:  has No Known Allergies.  MEDICATIONS:  Current Outpatient Medications  Medication Sig Dispense Refill   acetaminophen (TYLENOL) 650 MG CR tablet Take 1,300 mg by mouth every 8 (eight) hours as needed for pain.     azelastine (ASTELIN) 0.1 % nasal spray Place 1 spray into both nostrils 2 (two) times daily. Use in each nostril as directed 30 mL 1   benzonatate (TESSALON) 200 MG capsule Take 1 capsule (200  mg total) by mouth 3 (three) times daily as needed for cough. 20 capsule 5   chlorhexidine (PERIDEX) 0.12 % solution USE AS DIRECTED TAKE  15  ML  IN  THE  MOUTH  OR  THROAT  TWICE  DAILY 473 mL 0   Cholecalciferol (VITAMIN D3) 125 MCG (5000 UT) CAPS Take 5,000 Units by mouth daily.     glycopyrrolate (ROBINUL) 1 MG tablet Take 0.5 tablets (0.5 mg total) by mouth 2 (two) times daily as needed (excess secretions). 30 tablet 0   levothyroxine (SYNTHROID) 125 MCG tablet Take 125 mcg by mouth every morning.     loperamide (IMODIUM) 2 MG capsule Take 2 tabs by mouth with first loose stool, then 1 tab with each additional loose stool as needed. Do not exceed 8 tabs in a 24-hour period 90 capsule 0   Multiple Vitamins-Minerals (MULTIVITAMIN WITH MINERALS) tablet Take 1 tablet by mouth daily.     ondansetron (ZOFRAN) 8 MG tablet Take 1 tablet (8 mg total) by mouth every 8 (eight) hours as needed for nausea or vomiting. Start on the third day after chemotherapy. 90 tablet 1   pantoprazole (PROTONIX) 40 MG tablet Take 1 tablet (40 mg total) by mouth 2 (two) times daily. 60 tablet 1   potassium chloride SA (KLOR-CON M) 20 MEQ tablet Take 2 tablets (40 mEq total) by mouth 2 (two) times daily. 60 tablet 1   predniSONE (DELTASONE) 20 MG tablet Take 2 tablets (40 mg total) by mouth daily with breakfast. 60 tablet 0   prochlorperazine (COMPAZINE) 10 MG tablet Take 1 tablet (10 mg total) by mouth every 6 (six) hours as needed for nausea or vomiting. 90 tablet 1   magic mouthwash (multi-ingredient) oral suspension Swish and spit 5-10 mLs by mouth 4 (four) times daily as needed. 480 mL 1   No current facility-administered medications for this visit.    Review of Systems  Constitutional:  Negative for appetite change, chills, fatigue and fever.  HENT:   Negative for hearing loss and voice change.        Nasal congestion, postnasal drip.   Eyes:  Negative for eye problems and icterus.  Respiratory:  Negative for  chest tightness, cough and shortness of breath.   Cardiovascular:  Negative for chest pain and leg swelling.  Gastrointestinal:  Negative for abdominal distention, abdominal pain, nausea and vomiting.  Endocrine: Negative for hot flashes.  Genitourinary:  Negative for difficulty urinating, dysuria and frequency.   Musculoskeletal:  Negative for arthralgias.  Skin:  Negative for itching and rash.  Neurological:  Positive for numbness. Negative for light-headedness.  Hematological:  Negative for adenopathy. Does not bruise/bleed easily.  Psychiatric/Behavioral:  Negative for confusion.      PHYSICAL EXAMINATION: ECOG PERFORMANCE STATUS: 0 - Asymptomatic  Vitals:   06/30/23 0909 06/30/23 0923  BP: (!) 134/90 (!) 119/92  Pulse: (!) 109   Resp: 18   Temp: (!) 97 F (36.1 C)   SpO2: 99%    Filed Weights   06/30/23 0909  Weight: 188 lb (85.3 kg)    Physical Exam Constitutional:      General: He is not in acute distress.    Appearance: He is obese. He is not diaphoretic.  HENT:     Head: Normocephalic and atraumatic.  Eyes:     General: No scleral icterus. Cardiovascular:     Rate and Rhythm: Normal rate and regular rhythm.  Pulmonary:     Effort: Pulmonary effort is normal. No respiratory distress.     Breath sounds: No wheezing.  Abdominal:     General: There is no distension.     Palpations: Abdomen is soft.  Musculoskeletal:        General: Normal range of motion.     Cervical back: Normal range of motion and neck supple.  Skin:    General: Skin is warm and dry.     Findings: No erythema.  Neurological:     Mental Status: He is alert and oriented to person, place, and time. Mental status is at baseline.     Cranial Nerves: No cranial nerve deficit.     Motor: No abnormal muscle tone.  Psychiatric:        Mood and Affect: Mood and affect normal.      LABORATORY DATA:  I have reviewed the data as listed    Latest Ref Rng & Units 06/30/2023    8:45 AM  06/19/2023    9:42 AM 06/12/2023   12:34 PM  CBC  WBC 4.0 - 10.5 K/uL 11.6  2.3  7.9   Hemoglobin 13.0 - 17.0 g/dL 78.2  95.6  21.3   Hematocrit 39.0 - 52.0 % 39.7  35.7  36.6   Platelets 150 - 400 K/uL 256  270  302       Latest Ref Rng & Units 06/30/2023    8:45 AM 06/19/2023    9:42 AM 06/12/2023   12:34 PM  CMP  Glucose 70 - 99 mg/dL 086  578  469   BUN 8 - 23 mg/dL 18  12  14    Creatinine 0.61 - 1.24 mg/dL 6.29  5.28  4.13   Sodium 135 - 145 mmol/L 139  141  137   Potassium 3.5 - 5.1 mmol/L 3.3  3.4  3.3   Chloride 98 - 111 mmol/L 104  102  101   CO2 22 - 32 mmol/L 23  24  24    Calcium 8.9 - 10.3 mg/dL 9.1  9.4  9.4   Total Protein 6.5 - 8.1 g/dL 7.2  7.3  7.4   Total Bilirubin <1.2 mg/dL 0.5  0.7  0.7   Alkaline Phos 38 - 126 U/L 57  61  61   AST 15 - 41 U/L 25  27  24    ALT 0 - 44 U/L 27  24  20       RADIOGRAPHIC STUDIES: I have personally reviewed the radiological images as listed and agreed with the findings in the report. CT Angio Chest Pulmonary Embolism (PE) W or WO Contrast  Result Date: 06/19/2023 CLINICAL DATA:  Esophageal cancer being treated with chemotherapy. Marked shortness of breath. Blood in urine. * Tracking Code: BO * EXAM: CT ANGIOGRAPHY CHEST CT ABDOMEN AND PELVIS WITH CONTRAST TECHNIQUE: Multidetector CT imaging of the chest was performed using the standard protocol during bolus administration of intravenous contrast. Multiplanar CT image reconstructions and MIPs were obtained to evaluate the vascular anatomy. Multidetector CT imaging of the abdomen and pelvis was performed using the standard protocol during bolus administration of intravenous contrast. RADIATION DOSE REDUCTION: This exam was performed according to the departmental dose-optimization program which includes automated exposure control, adjustment of the mA and/or kV according to patient size and/or use of iterative reconstruction technique. CONTRAST:  OMNIPAQUE IOHEXOL 350 MG/ML SOLN  COMPARISON:  PET 05/21/2023, CT chest abdomen pelvis 05/02/2023. FINDINGS: CTA CHEST FINDINGS Cardiovascular: Right IJ Port-A-Cath terminates in the low SVC. Negative for pulmonary embolus. Atherosclerotic calcification of the aorta and left anterior descending coronary artery. Ascending aorta measures up to 4.0 cm (7/63). Heart is at the upper limits of normal in size to mildly enlarged. No pericardial effusion. Mediastinum/Nodes: No pathologically enlarged mediastinal, hilar or axillary lymph nodes. Similar lower esophageal wall thickening. Lungs/Pleura: Minimal patchy mid and lower lung zone predominant coarsened interstitial and subpleural ground-glass with bronchiectasis, similar to 05/02/2023. Suspect post radiation scarring in the medial right lower lobe. No pleural fluid. Airway is unremarkable. Musculoskeletal: Degenerative changes in the spine. No worrisome lytic or sclerotic lesions. Review of the MIP images confirms the above findings. CT ABDOMEN and PELVIS FINDINGS Hepatobiliary: New and enlarging hepatic metastases are seen predominantly in the right lobe of the liver. New mass in the dome of the right hepatic lobe measures 2.5 x 2.8 cm (2/9). Stones in the gallbladder. No biliary ductal dilatation. Pancreas: Negative. Spleen: Negative. Adrenals/Urinary Tract: Heterogeneous right adrenal mass measures 4.1 x 4.6 cm, increased slightly from 3.4 x 4.3 cm. Left adrenal gland and right kidney are unremarkable. Low-attenuation lesions in the left kidney. No specific follow-up necessary. Small left renal stone. Ureters are decompressed. Bladder is low in volume. Stomach/Bowel: Small hiatal hernia. Small posterior gastric diverticulum. Stomach, small bowel, appendix and colon are otherwise unremarkable. Vascular/Lymphatic: Atherosclerotic calcification of the aorta. No pathologically enlarged lymph nodes. Reproductive: Prostate is visualized. Other: No free fluid.  Mesenteries and peritoneum are unremarkable.  Musculoskeletal: Degenerative changes in the spine. Dextroconvex scoliosis. Review of the MIP images confirms the above findings. IMPRESSION: 1. Negative for pulmonary embolus. 2. Persistent lower esophageal wall thickening with progressive hepatic and right adrenal metastatic disease, compatible with stage IV esophageal carcinoma, as on PET 05/21/2023. 3. 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 4. 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 5. 4.0 cm ascending aortic aneurysm. Recommend annual imaging followup by CTA or MRA. This recommendation follows 2010 ACCF/AHA/AATS/ACR/ASA/SCA/SCAI/SIR/STS/SVM Guidelines for the Diagnosis and Management of Patients with Thoracic Aortic Disease. Circulation. 2010; 121: O130-Q657. Aortic aneurysm NOS (ICD10-I71.9). 6. Cholelithiasis. 7. Small left renal stone. 8. Aortic atherosclerosis (ICD10-I70.0). Left and descending coronary artery calcification. Electronically Signed   By: Leanna Battles M.D.   On: 06/19/2023 13:37   CT ABDOMEN PELVIS W CONTRAST  Result Date: 06/19/2023 CLINICAL DATA:  Esophageal cancer being treated with chemotherapy. Marked shortness of breath. Blood in urine. * Tracking Code: BO * EXAM: CT ANGIOGRAPHY CHEST CT ABDOMEN AND PELVIS WITH CONTRAST TECHNIQUE: Multidetector CT imaging of the chest was performed using  the standard protocol during bolus administration of intravenous contrast. Multiplanar CT image reconstructions and MIPs were obtained to evaluate the vascular anatomy. Multidetector CT imaging of the abdomen and pelvis was performed using the standard protocol during bolus administration of intravenous contrast. RADIATION DOSE REDUCTION: This exam was performed according to the departmental dose-optimization program which includes automated exposure  control, adjustment of the mA and/or kV according to patient size and/or use of iterative reconstruction technique. CONTRAST:  OMNIPAQUE IOHEXOL 350 MG/ML SOLN COMPARISON:  PET 05/21/2023, CT chest abdomen pelvis 05/02/2023. FINDINGS: CTA CHEST FINDINGS Cardiovascular: Right IJ Port-A-Cath terminates in the low SVC. Negative for pulmonary embolus. Atherosclerotic calcification of the aorta and left anterior descending coronary artery. Ascending aorta measures up to 4.0 cm (7/63). Heart is at the upper limits of normal in size to mildly enlarged. No pericardial effusion. Mediastinum/Nodes: No pathologically enlarged mediastinal, hilar or axillary lymph nodes. Similar lower esophageal wall thickening. Lungs/Pleura: Minimal patchy mid and lower lung zone predominant coarsened interstitial and subpleural ground-glass with bronchiectasis, similar to 05/02/2023. Suspect post radiation scarring in the medial right lower lobe. No pleural fluid. Airway is unremarkable. Musculoskeletal: Degenerative changes in the spine. No worrisome lytic or sclerotic lesions. Review of the MIP images confirms the above findings. CT ABDOMEN and PELVIS FINDINGS Hepatobiliary: New and enlarging hepatic metastases are seen predominantly in the right lobe of the liver. New mass in the dome of the right hepatic lobe measures 2.5 x 2.8 cm (2/9). Stones in the gallbladder. No biliary ductal dilatation. Pancreas: Negative. Spleen: Negative. Adrenals/Urinary Tract: Heterogeneous right adrenal mass measures 4.1 x 4.6 cm, increased slightly from 3.4 x 4.3 cm. Left adrenal gland and right kidney are unremarkable. Low-attenuation lesions in the left kidney. No specific follow-up necessary. Small left renal stone. Ureters are decompressed. Bladder is low in volume. Stomach/Bowel: Small hiatal hernia. Small posterior gastric diverticulum. Stomach, small bowel, appendix and colon are otherwise unremarkable. Vascular/Lymphatic: Atherosclerotic  calcification of the aorta. No pathologically enlarged lymph nodes. Reproductive: Prostate is visualized. Other: No free fluid.  Mesenteries and peritoneum are unremarkable. Musculoskeletal: Degenerative changes in the spine. Dextroconvex scoliosis. Review of the MIP images confirms the above findings. IMPRESSION: 1. Negative for pulmonary embolus. 2. Persistent lower esophageal wall thickening with progressive hepatic and right adrenal metastatic disease, compatible with stage IV esophageal carcinoma, as on PET 05/21/2023. 3. 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 4. 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 5. 4.0 cm ascending aortic aneurysm. Recommend annual imaging followup by CTA or MRA. This recommendation follows 2010 ACCF/AHA/AATS/ACR/ASA/SCA/SCAI/SIR/STS/SVM Guidelines for the Diagnosis and Management of Patients with Thoracic Aortic Disease. Circulation. 2010; 121: [id number]. Aortic aneurysm NOS (ICD10-I71.9). 6. Cholelithiasis. 7. Small left renal stone. 8. Aortic atherosclerosis (ICD10-I70.0). Left and descending coronary artery calcification. Electronically Signed   By: Leanna Battles M.D.   On: 06/19/2023 13:37   MR Brain W Wo Contrast  Result Date: 06/04/2023 CLINICAL DATA:  Brain metastases suspected EXAM: MRI HEAD WITHOUT AND WITH CONTRAST TECHNIQUE: Multiplanar, multiecho pulse sequences of the brain and surrounding structures were obtained without and with intravenous contrast. CONTRAST:  9mL GADAVIST GADOBUTROL 1 MMOL/ML IV SOLN COMPARISON:  04/18/2021 MRI head IAC protocol, correlation is also made with 05/21/2023 PET-CT the FINDINGS: Brain: No restricted diffusion to suggest acute or subacute infarct. No abnormal parenchymal or meningeal enhancement. No acute hemorrhage, mass, mass effect, or  midline shift. No hydrocephalus or extra-axial collection. Pituitary and craniocervical junction within normal limits. 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 Normal cerebral volume for age. Scattered T2 hyperintense signal in the periventricular white matter, likely the sequela of mild chronic small vessel ischemic disease. Vascular: Normal arterial flow voids. Normal arterial and venous enhancement. Skull and upper cervical spine: Normal marrow signal. Sinuses/Orbits: Clear paranasal sinuses. No acute finding in the orbits. Other: Fluid in the left mastoid air cells. IMPRESSION: No acute intracranial process. No evidence of intracranial metastatic disease. Electronically Signed   By: Wiliam Ke M.D.   On: 06/04/2023 18:31

## 2023-06-30 NOTE — Assessment & Plan Note (Signed)
Grade 2, he prefers to hold of neuropathy treatments.  observation.

## 2023-06-30 NOTE — Patient Instructions (Signed)
[facility] - A DEPT OF [facility]  Discharge Instructions: Thank you for choosing [facility] to provide your oncology and hematology care.  If you have a lab appointment with the Cancer Center, please go directly to the Cancer Center and check in at the registration area.  Wear comfortable clothing and clothing appropriate for easy access to any Portacath or PICC line.   We strive to give you quality time with your provider. You may need to reschedule your appointment if you arrive late (15 or more minutes).  Arriving late affects you and other patients whose appointments are after yours.  Also, if you miss three or more appointments without notifying the office, you may be dismissed from the clinic at the provider's discretion.      For prescription refill requests, have your pharmacy contact our office and allow 72 hours for refills to be completed.    Today you received the following chemotherapy and/or immunotherapy agents irinotecan/fluorouracil    To help prevent nausea and vomiting after your treatment, we encourage you to take your nausea medication as directed.  BELOW ARE SYMPTOMS THAT SHOULD BE REPORTED IMMEDIATELY: *FEVER GREATER THAN 100.4 F (38 C) OR HIGHER *CHILLS OR SWEATING *NAUSEA AND VOMITING THAT IS NOT CONTROLLED WITH YOUR NAUSEA MEDICATION *UNUSUAL SHORTNESS OF BREATH *UNUSUAL BRUISING OR BLEEDING *URINARY PROBLEMS (pain or burning when urinating, or frequent urination) *BOWEL PROBLEMS (unusual diarrhea, constipation, pain near the anus) TENDERNESS IN MOUTH AND THROAT WITH OR WITHOUT PRESENCE OF ULCERS (sore throat, sores in mouth, or a toothache) UNUSUAL RASH, SWELLING OR PAIN  UNUSUAL VAGINAL DISCHARGE OR ITCHING   Items with * indicate a potential emergency and should be followed up as soon as possible or go to the Emergency Department if any problems should occur.  Please show the CHEMOTHERAPY ALERT CARD or  IMMUNOTHERAPY ALERT CARD at check-in to the Emergency Department and triage nurse.  Should you have questions after your visit or need to cancel or reschedule your appointment, please contact [facility] - A DEPT OF [facility]  [phone] and follow the prompts.  Office hours are 8:00 a.m. to 4:30 p.m. Monday - Friday. Please note that voicemails left after 4:00 p.m. may not be returned until the following business day.  We are closed weekends and major holidays. You have access to a nurse at all times for urgent questions. Please call the main number to the clinic [phone] and follow the prompts.  For any non-urgent questions, you may also contact your provider using MyChart. We now offer e-Visits for anyone 14 and older to request care online for non-urgent symptoms. For details visit [email or web address].   Also download the MyChart app! Go to the app store, search "MyChart", open the app, select [facility], and log in with your MyChart username and password.

## 2023-07-01 LAB — CEA: CEA: 115 ng/mL — ABNORMAL HIGH (ref 0.0–4.7)

## 2023-07-02 ENCOUNTER — Inpatient Hospital Stay: Payer: No Typology Code available for payment source

## 2023-07-02 VITALS — BP 122/89 | HR 114

## 2023-07-02 DIAGNOSIS — R0982 Postnasal drip: Secondary | ICD-10-CM | POA: Diagnosis not present

## 2023-07-02 DIAGNOSIS — J340 Abscess, furuncle and carbuncle of nose: Secondary | ICD-10-CM | POA: Diagnosis not present

## 2023-07-02 DIAGNOSIS — Z5111 Encounter for antineoplastic chemotherapy: Secondary | ICD-10-CM | POA: Diagnosis not present

## 2023-07-02 DIAGNOSIS — C159 Malignant neoplasm of esophagus, unspecified: Secondary | ICD-10-CM

## 2023-07-02 MED ORDER — HEPARIN SOD (PORK) LOCK FLUSH 100 UNIT/ML IV SOLN
500.0000 [IU] | Freq: Once | INTRAVENOUS | Status: AC | PRN
  Administered 2023-07-02: 500 [IU]
  Filled 2023-07-02: qty 5

## 2023-07-02 MED ORDER — SODIUM CHLORIDE 0.9% FLUSH
10.0000 mL | INTRAVENOUS | Status: DC | PRN
  Administered 2023-07-02: 10 mL
  Filled 2023-07-02: qty 10

## 2023-07-03 ENCOUNTER — Ambulatory Visit: Payer: No Typology Code available for payment source | Admitting: Oncology

## 2023-07-03 ENCOUNTER — Inpatient Hospital Stay: Payer: No Typology Code available for payment source | Admitting: Hospice and Palliative Medicine

## 2023-07-03 ENCOUNTER — Other Ambulatory Visit: Payer: No Typology Code available for payment source

## 2023-07-03 ENCOUNTER — Inpatient Hospital Stay: Payer: No Typology Code available for payment source

## 2023-07-03 ENCOUNTER — Ambulatory Visit: Payer: No Typology Code available for payment source

## 2023-07-03 NOTE — Progress Notes (Signed)
Nutrition Follow-up:  Patient with esophageal cancer. Patient receiving folfiri.    Spoke with patient via phone.  Reports that appetite is still about the same.  Says that he does not have a desire to eat.  Foods are going down ok just does not want to eat.  Tried the carnation breakfast essentials mixed with milk and was able to drink the vanilla.  Says that he is eating some candy at times.  Thinks he might can eat some chicken wings and planning oatmeal.    Medications: reviewed  Labs: glucose 112  Anthropometrics:   Weight 188 lb on 12/2 189 lb 9.6 oz on 11/21 198 lb 12/8 oz on 11/7 217 lb 14.4 oz on 9/27 219 lb on 9/6 220 lb on 8/14 223 lb on 7/31 236 lb on 5/13 265 lb on 1/31  14% weight loss in the last 4 months, significant   NUTRITION DIAGNOSIS: Inadequate oral intake ongoing    INTERVENTION:  Continue carnation breakfast essentials mixed with milk.  Can add ice cream to it for additional calories Consider trial of appetite stimulant.  Message sent to MD Encouraged patient to eat by the clock q 2 hours    MONITORING, EVALUATION, GOAL: weight trends, intake   NEXT VISIT: Thursday, Dec 19 phone call  Danae Oland B. Freida Busman, RD, LDN Registered Dietitian [phone]

## 2023-07-07 ENCOUNTER — Inpatient Hospital Stay: Payer: No Typology Code available for payment source

## 2023-07-07 ENCOUNTER — Other Ambulatory Visit: Payer: Self-pay

## 2023-07-07 ENCOUNTER — Inpatient Hospital Stay (HOSPITAL_BASED_OUTPATIENT_CLINIC_OR_DEPARTMENT_OTHER): Payer: No Typology Code available for payment source | Admitting: Oncology

## 2023-07-07 ENCOUNTER — Inpatient Hospital Stay (HOSPITAL_BASED_OUTPATIENT_CLINIC_OR_DEPARTMENT_OTHER): Payer: No Typology Code available for payment source | Admitting: Hospice and Palliative Medicine

## 2023-07-07 ENCOUNTER — Encounter: Payer: Self-pay | Admitting: Oncology

## 2023-07-07 VITALS — BP 130/94 | HR 120 | Temp 96.0°F | Resp 18 | Wt 179.2 lb

## 2023-07-07 DIAGNOSIS — I82621 Acute embolism and thrombosis of deep veins of right upper extremity: Secondary | ICD-10-CM | POA: Diagnosis not present

## 2023-07-07 DIAGNOSIS — G620 Drug-induced polyneuropathy: Secondary | ICD-10-CM | POA: Diagnosis not present

## 2023-07-07 DIAGNOSIS — C159 Malignant neoplasm of esophagus, unspecified: Secondary | ICD-10-CM

## 2023-07-07 DIAGNOSIS — G4733 Obstructive sleep apnea (adult) (pediatric): Secondary | ICD-10-CM | POA: Diagnosis not present

## 2023-07-07 DIAGNOSIS — Z515 Encounter for palliative care: Secondary | ICD-10-CM | POA: Diagnosis not present

## 2023-07-07 DIAGNOSIS — R110 Nausea: Secondary | ICD-10-CM | POA: Diagnosis not present

## 2023-07-07 DIAGNOSIS — R000 Tachycardia, unspecified: Secondary | ICD-10-CM

## 2023-07-07 DIAGNOSIS — E876 Hypokalemia: Secondary | ICD-10-CM | POA: Diagnosis not present

## 2023-07-07 DIAGNOSIS — R634 Abnormal weight loss: Secondary | ICD-10-CM

## 2023-07-07 DIAGNOSIS — T451X5A Adverse effect of antineoplastic and immunosuppressive drugs, initial encounter: Secondary | ICD-10-CM

## 2023-07-07 DIAGNOSIS — Z008 Encounter for other general examination: Secondary | ICD-10-CM | POA: Diagnosis not present

## 2023-07-07 DIAGNOSIS — R9431 Abnormal electrocardiogram [ECG] [EKG]: Secondary | ICD-10-CM

## 2023-07-07 DIAGNOSIS — R0981 Nasal congestion: Secondary | ICD-10-CM

## 2023-07-07 DIAGNOSIS — R630 Anorexia: Secondary | ICD-10-CM | POA: Diagnosis not present

## 2023-07-07 DIAGNOSIS — J984 Other disorders of lung: Secondary | ICD-10-CM

## 2023-07-07 DIAGNOSIS — Z5111 Encounter for antineoplastic chemotherapy: Secondary | ICD-10-CM | POA: Diagnosis not present

## 2023-07-07 LAB — CMP (CANCER CENTER ONLY)
ALT: 26 U/L (ref 0–44)
AST: 29 U/L (ref 15–41)
Albumin: 3.5 g/dL (ref 3.5–5.0)
Alkaline Phosphatase: 55 U/L (ref 38–126)
Anion gap: 16 — ABNORMAL HIGH (ref 5–15)
BUN: 18 mg/dL (ref 8–23)
CO2: 22 mmol/L (ref 22–32)
Calcium: 9.4 mg/dL (ref 8.9–10.3)
Chloride: 99 mmol/L (ref 98–111)
Creatinine: 1.09 mg/dL (ref 0.61–1.24)
GFR, Estimated: >60 mL/min (ref >60)
Glucose, Bld: 114 mg/dL — ABNORMAL HIGH (ref 70–99)
Potassium: 3.1 mmol/L — ABNORMAL LOW (ref 3.5–5.1)
Sodium: 137 mmol/L (ref 135–145)
Total Bilirubin: 1 mg/dL (ref <1.2)
Total Protein: 7.3 g/dL (ref 6.5–8.1)

## 2023-07-07 LAB — CBC WITH DIFFERENTIAL (CANCER CENTER ONLY)
Abs Immature Granulocytes: 0.03 10*3/uL (ref 0.00–0.07)
Basophils Absolute: 0 10*3/uL (ref 0.0–0.1)
Basophils Relative: 0 %
Eosinophils Absolute: 0 10*3/uL (ref 0.0–0.5)
Eosinophils Relative: 1 %
HCT: 40.5 % (ref 39.0–52.0)
Hemoglobin: 12.9 g/dL — ABNORMAL LOW (ref 13.0–17.0)
Immature Granulocytes: 1 %
Lymphocytes Relative: 15 %
Lymphs Abs: 0.8 10*3/uL (ref 0.7–4.0)
MCH: 27.8 pg (ref 26.0–34.0)
MCHC: 31.9 g/dL (ref 30.0–36.0)
MCV: 87.3 fL (ref 80.0–100.0)
Monocytes Absolute: 0.2 10*3/uL (ref 0.1–1.0)
Monocytes Relative: 4 %
Neutro Abs: 4.2 10*3/uL (ref 1.7–7.7)
Neutrophils Relative %: 79 %
Platelet Count: 197 10*3/uL (ref 150–400)
RBC: 4.64 MIL/uL (ref 4.22–5.81)
RDW: 17.4 % — ABNORMAL HIGH (ref 11.5–15.5)
WBC Count: 5.3 10*3/uL (ref 4.0–10.5)
nRBC: 0 % (ref 0.0–0.2)

## 2023-07-07 LAB — MAGNESIUM: Magnesium: 2.2 mg/dL (ref 1.7–2.4)

## 2023-07-07 MED ORDER — HEPARIN SOD (PORK) LOCK FLUSH 100 UNIT/ML IV SOLN
500.0000 [IU] | Freq: Once | INTRAVENOUS | Status: AC
  Administered 2023-07-07: 500 [IU] via INTRAVENOUS
  Filled 2023-07-07: qty 5

## 2023-07-07 MED ORDER — ONDANSETRON HCL 4 MG/2ML IJ SOLN
8.0000 mg | Freq: Once | INTRAMUSCULAR | Status: AC
  Administered 2023-07-07: 8 mg via INTRAVENOUS
  Filled 2023-07-07: qty 4

## 2023-07-07 MED ORDER — POTASSIUM CHLORIDE IN NACL 20-0.9 MEQ/L-% IV SOLN
Freq: Once | INTRAVENOUS | Status: AC
Start: 2023-07-07 — End: 2023-07-07
  Filled 2023-07-07: qty 1000

## 2023-07-07 NOTE — Assessment & Plan Note (Signed)
Likely due to poor oral intake, deconditoning, dehydration. He gets IVF hydration today.  Sinus Tachycardia. 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

## 2023-07-07 NOTE — Assessment & Plan Note (Signed)
Off Eliquis 2.5mg  BID due to GI bleeding

## 2023-07-07 NOTE — Assessment & Plan Note (Signed)
Due to poor oral intake.  IVF 1L NS today,  Follow up with nutritionist

## 2023-07-07 NOTE — Progress Notes (Signed)
Palliative Medicine [facility] at [facility] [phone] [phone]   Name: Jonathan [name] Date: 07/07/2023 MRN: [id number]  DOB: 1955/11/02  Patient Care Team: [name], MD as PCP - General (Family Medicine) [name], RN as Oncology Nurse Navigator Rickard Patience, MD as Consulting Physician (Oncology)    REASON FOR CONSULTATION: Jonathan [name] is a 67 y.o. 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  He was referred to palliative care to address goals.  SOCIAL HISTORY:     reports that he has never smoked. He has never used smokeless tobacco. He reports current alcohol use. He reports that he does not use drugs.  Patient unmarried has no children.  Lives at home alone.  ADVANCE DIRECTIVES:  Not on file  CODE STATUS: DNR/DNI (DNR order signed on 07/07/23)  PAST MEDICAL HISTORY: Past Medical History:  Diagnosis Date   Arthritis    Complication of anesthesia    OCCURRED ONCE YEARS AGO 1981   Esophageal mass    Hypertension    Hypothyroidism    PONV (postoperative nausea and vomiting)     PAST SURGICAL HISTORY:  Past Surgical History:  Procedure Laterality Date   COLONOSCOPY     ESOPHAGOGASTRODUODENOSCOPY N/A 08/23/2022   Procedure: ESOPHAGOGASTRODUODENOSCOPY (EGD);  Surgeon: Jaynie Collins, DO;  Location: [facility] ENDOSCOPY;  Service: Gastroenterology;  Laterality: N/A;   ESOPHAGOGASTRODUODENOSCOPY (EGD) WITH PROPOFOL N/A 05/05/2023   Procedure: ESOPHAGOGASTRODUODENOSCOPY (EGD) WITH PROPOFOL;  Surgeon: Jaynie Collins, DO;  Location: [facility] ENDOSCOPY;  Service: Gastroenterology;  Laterality: N/A;   EUS N/A 09/12/2022   Procedure: FULL UPPER ENDOSCOPIC ULTRASOUND (EUS) RADIAL;  Surgeon: Bearl Mulberry, MD;  Location: [facility] ENDOSCOPY;  Service: Gastroenterology;   Laterality: N/A;   FINGER SURGERY     HEMOSTASIS CONTROL  05/05/2023   Procedure: HEMOSTASIS CONTROL;  Surgeon: Jaynie Collins, DO;  Location: [facility] ENDOSCOPY;  Service: Gastroenterology;;   PORTA CATH INSERTION N/A 10/17/2022   Procedure: PORTA CATH INSERTION;  Surgeon: Annice Needy, MD;  Location: [facility] INVASIVE CV LAB;  Service: Cardiovascular;  Laterality: N/A;   TENDON REPAIR IN LEFT KNEE      HEMATOLOGY/ONCOLOGY HISTORY:  Oncology History  Adenocarcinoma of esophagus (HCC)  08/28/2022 Initial Diagnosis   Adenocarcinoma of esophagus   -Patient has noticed worsening of "food stuck/fullness" sensation since November 2023.  Patient had a barium swallow study which commented on marked mucosal irregularity in the distal esophagus with Broaddus base mural filling defect highly suspicious for malignancy.  Patient establish care with gastroenterology. -08/23/2022, EGD showed gastritis and partially obstructing malignant esophageal tumor in the lower third of the esophagus. Esophagus mass biopsy showed adenocarcinoma.  PD-L1 TPS 65%, HER2 negative.  [facility] NGS showed KRASG12V, CDKN2A, ARID1A, TP53, TMB 5.3, MSI stable.  Stomach biopsy showed gastric mucosa with no specific histology abnormality.  No significant intestinal metaplastic, dysplastic, granular atrophy or increased inflammation.     08/28/2022 Imaging   CT chest abdomen pelvis with contrast showed 1. Distal esophageal primary with gastrohepatic ligament nodal metastasis. 2. 2 right-sided pulmonary nodules, the largest of which measures 5 mm and is new since 2015. Pulmonary metastasis not be excluded. 3. Anterior right lower lobe volume loss and minimal soft tissue density, favoring atelectasis or scar. Recommend attention on follow-up. 4. Hepatic steatosis 5. Cholelithiasis 6. Left nephrolithiasis 7. Coronary artery atherosclerosis. Aortic Atherosclerosis  08/28/2022 Cancer Staging   Staging form: Esophagus - Adenocarcinoma,  AJCC 8th Edition - Clinical stage from 08/28/2022: Stage IVB (cT3, cN1, cM1) - Signed by Rickard Patience, MD on 10/04/2022 Stage prefix: Initial diagnosis   09/06/2022 Imaging   PET scan showed 1. Esophageal primary with gastrohepatic ligament nodal metastasis,as on CT. 2. Focus of hypermetabolism which is favored to registered to the posterior hepatic dome, in the region of subtle heterogeneity on prior diagnostic CT. Suboptimally evaluated secondary to underlying steatosis. Recommend further evaluation with pre and post contrast abdominal MRI to confirm probable metastasis. 3. Incidental findings, including: Left nephrolithiasis.Cholelithiasis. Coronary artery atherosclerosis. Aortic Atherosclerosis    09/23/2022 Imaging   MRI abdomen with and without contrast showed 1. Mildly T2 hyperintense segment VII hepatic lesion measuring 2.7 cm with imaging characteristics compatible with metastatic disease. 2. Tiny focus of delayed enhancement in the inferior right lobe of the liver segment VI measuring 8 mm with ill-defined increased T2 signal and subtle corresponding reduced diffusivity, also suspicious for metastatic disease. 3. Partially visualized distal esophageal wall thickening compatible with the patient's known primary neoplasm. 4. Similar size of the 11 mm gastrohepatic ligament lymph node mildly metabolic on prior PET-CT and compatible with local nodal disease involvement. 5. 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 Recommend follow up pre and post-contrast MRI/MRCP in 1 year. 6. Diffuse hepatic steatosis.   10/10/2022 Procedure   LIVER MASS; CT-GUIDED BIOPSY:  - MODERATE TO POORLY DIFFERENTIATED ADENOCARCINOMA MORPHOLOGICALLY  CONSISTENT WITH METASTASIS FROM PATIENT'S KNOWN ESOPHAGEAL  ADENOCARCINOMA.    10/17/2022 Procedure   Medi port placed by Dr. Wyn Quaker   10/21/2022 - 01/15/2023 Chemotherapy   Patient is on  Treatment Plan : ESOPHAGEAL ADENOCARCINOMA FOLFOX q14d x 6 cycles     01/15/2023 Imaging   PET showed  1. Interval progression in metastatic esophageal carcinoma as evidenced by hypermetabolic lymph nodes in the neck, chest, abdomen and pelvis, an enlarging right hepatic lobe metastasis, new bilateral adrenal metastases and new osseous metastases. 2. Cholelithiasis. 3. Left renal stones. 4. Aortic atherosclerosis (ICD10-I70.0). Coronary artery calcification.    01/27/2023 - 03/14/2023 Chemotherapy   Patient is on Treatment Plan : GASTROESOPHAGEAL FOLFOX + Nivolumab q14d     03/20/2023 Imaging   CT chest abdomen pelvis w contrast showed 1. Interval progression of metastatic disease with multiple new and enlarged hypodense lesions throughout the liver. 2. Significant enlargement of bilateral adrenal metastases. 3. Subtle sclerosis of an osseous metastasis of the right femoral neck. Other previously FDG avid osseous metastases of the left femoral neck and right sixth rib not appreciated by CT. 4. No persistently enlarged lymph nodes. 5. Interval development of bandlike consolidation and fibrosis of the paramedian right lung, particularly of the right lower lobe, as well as additional scattered irregular opacities throughout the right lung. 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 6. Similar circumferential wall thickening throughout the mid to lower esophagus, consistent with known primary esophageal adenocarcinoma. 7. Nonobstructive bilateral nephrolithiasis. Aortic Atherosclerosis   03/27/2023 - 06/19/2023 Chemotherapy   Patient is on Treatment Plan : GASTROESOPHAGEAL Ramucirumab D1, 15 + Paclitaxel D1,8,15 q28d     05/02/2023 - 05/08/2023 Hospital Admission   Admission due to coffee-ground emesis. EGD 10/7 showed bleeding from esophageal cancer, esophagitis due to chemo and radiation. Applied Hemospray. Eliquis was stopped. Acute blood loss  anemia, received IV venofer treatments x 3 doses  05/28/2023 Imaging   1. Progressive hypermetabolism in the distal esophagus consistent with progressive disease. 2. Significant progression of metastatic hepatic disease (new and enlarging lesions). 3. Enlarging and progressively hypermetabolic right adrenal gland mass. 4. New hypermetabolic bone lesions in the T12 and L2 vertebral bodies, left sacrum and right sixth posterior rib consistent with metastatic disease. 5. Possible hypermetabolic brain lesions. MRI suggested for further evaluation. 6. Chronic medial right lower lobe atelectasis likely radiation change. Moderate hypermetabolism but no obvious tumor. 7. Stable gallstones, renal calculi and vascular calcifications.   06/19/2023 Imaging   CT chest angiogram w contrast, CT abdomen pelvis w contrast  1. Negative for pulmonary embolus. 2. Persistent lower esophageal wall thickening with progressive hepatic and right adrenal metastatic disease, compatible with stage IV esophageal carcinoma, as on PET 05/21/2023. 3. 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  4. 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 5. 4.0 cm ascending aortic aneurysm. Recommend annual imaging follow up by CTA or MRA. This recommendation follows 2010 ACCF/AHA/AATS/ACR/ASA/SCA/SCAI/SIR/STS/SVM Guidelines for the Diagnosis and Management of Patients with Thoracic Aortic Disease.Circulation. 2010; 121: H474-Q595. Aortic aneurysm NOS (ICD10-I71.9). 6. Cholelithiasis. 7. Small left renal stone. 8. Aortic atherosclerosis (ICD10-I70.0). Left and descending coronary artery calcification.   06/30/2023 -  Chemotherapy   Patient is on Treatment Plan : COLORECTAL FOLFIRI q14d       ALLERGIES:  has No Known Allergies.  MEDICATIONS:   Current Outpatient Medications  Medication Sig Dispense Refill   acetaminophen (TYLENOL) 650 MG CR tablet Take 1,300 mg by mouth every 8 (eight) hours as needed for pain.     azelastine (ASTELIN) 0.1 % nasal spray Place 1 spray into both nostrils 2 (two) times daily. Use in each nostril as directed 30 mL 1   benzonatate (TESSALON) 200 MG capsule Take 1 capsule (200 mg total) by mouth 3 (three) times daily as needed for cough. 20 capsule 5   chlorhexidine (PERIDEX) 0.12 % solution USE AS DIRECTED TAKE  15  ML  IN  THE  MOUTH  OR  THROAT  TWICE  DAILY 473 mL 0   Cholecalciferol (VITAMIN D3) 125 MCG (5000 UT) CAPS Take 5,000 Units by mouth daily.     glycopyrrolate (ROBINUL) 1 MG tablet Take 0.5 tablets (0.5 mg total) by mouth 2 (two) times daily as needed (excess secretions). 30 tablet 0   levothyroxine (SYNTHROID) 125 MCG tablet Take 125 mcg by mouth every morning.     loperamide (IMODIUM) 2 MG capsule Take 2 tabs by mouth with first loose stool, then 1 tab with each additional loose stool as needed. Do not exceed 8 tabs in a 24-hour period 90 capsule 0   magic mouthwash (multi-ingredient) oral suspension Swish and spit 5-10 mLs by mouth 4 (four) times daily as needed. 480 mL 1   Multiple Vitamins-Minerals (MULTIVITAMIN WITH MINERALS) tablet Take 1 tablet by mouth daily.     ondansetron (ZOFRAN) 8 MG tablet Take 1 tablet (8 mg total) by mouth every 8 (eight) hours as needed for nausea or vomiting. Start on the third day after chemotherapy. 90 tablet 1   pantoprazole (PROTONIX) 40 MG tablet Take 1 tablet (40 mg total) by mouth 2 (two) times daily. 60 tablet 1   potassium chloride SA (KLOR-CON M) 20 MEQ tablet Take 2 tablets (40 mEq total) by mouth 2 (two) times daily. 60 tablet 1  predniSONE (DELTASONE) 20 MG tablet Take 2 tablets (40 mg total) by mouth daily with breakfast. 60 tablet 0   prochlorperazine (COMPAZINE) 10 MG tablet Take 1 tablet (10 mg total) by mouth every 6 (six) hours as needed for  nausea or vomiting. 90 tablet 1   No current facility-administered medications for this visit.   Facility-Administered Medications Ordered in Other Visits  Medication Dose Route Frequency Provider Last Rate Last Admin   0.9 % NaCl with KCl 20 mEq/ L  infusion   Intravenous Once Rickard Patience, MD 999 mL/hr at 07/07/23 1000 New Bag at 07/07/23 1000    VITAL SIGNS: There were no vitals taken for this visit. There were no vitals filed for this visit.  Estimated body mass index is 26.46 kg/m as calculated from the following:   Height as of 06/12/23: 5\' 9"  (1.753 m).   Weight as of an earlier encounter on 07/07/23: 179 lb 3.2 oz (81.3 kg).  LABS: CBC:    Component Value Date/Time   WBC 5.3 07/07/2023 0826   WBC 3.9 (L) 05/08/2023 0443   HGB 12.9 (L) 07/07/2023 0826   HCT 40.5 07/07/2023 0826   PLT 197 07/07/2023 0826   MCV 87.3 07/07/2023 0826   NEUTROABS 4.2 07/07/2023 0826   LYMPHSABS 0.8 07/07/2023 0826   MONOABS 0.2 07/07/2023 0826   EOSABS 0.0 07/07/2023 0826   BASOSABS 0.0 07/07/2023 0826   Comprehensive Metabolic Panel:    Component Value Date/Time   NA 137 07/07/2023 0826   K 3.1 (L) 07/07/2023 0826   CL 99 07/07/2023 0826   CO2 22 07/07/2023 0826   BUN 18 07/07/2023 0826   CREATININE 1.09 07/07/2023 0826   GLUCOSE 114 (H) 07/07/2023 0826   CALCIUM 9.4 07/07/2023 0826   AST 29 07/07/2023 0826   ALT 26 07/07/2023 0826   ALKPHOS 55 07/07/2023 0826   BILITOT 1.0 07/07/2023 0826   PROT 7.3 07/07/2023 0826   ALBUMIN 3.5 07/07/2023 0826    RADIOGRAPHIC STUDIES: CT Angio Chest Pulmonary Embolism (PE) W or WO Contrast  Result Date: 06/19/2023 CLINICAL DATA:  Esophageal cancer being treated with chemotherapy. Marked shortness of breath. Blood in urine. * Tracking Code: BO * EXAM: CT ANGIOGRAPHY CHEST CT ABDOMEN AND PELVIS WITH CONTRAST TECHNIQUE: Multidetector CT imaging of the chest was performed using the standard protocol during bolus administration of intravenous  contrast. Multiplanar CT image reconstructions and MIPs were obtained to evaluate the vascular anatomy. Multidetector CT imaging of the abdomen and pelvis was performed using the standard protocol during bolus administration of intravenous contrast. RADIATION DOSE REDUCTION: This exam was performed according to the departmental dose-optimization program which includes automated exposure control, adjustment of the mA and/or kV according to patient size and/or use of iterative reconstruction technique. CONTRAST:  OMNIPAQUE IOHEXOL 350 MG/ML SOLN COMPARISON:  PET 05/21/2023, CT chest abdomen pelvis 05/02/2023. FINDINGS: CTA CHEST FINDINGS Cardiovascular: Right IJ Port-A-Cath terminates in the low SVC. Negative for pulmonary embolus. Atherosclerotic calcification of the aorta and left anterior descending coronary artery. Ascending aorta measures up to 4.0 cm (7/63). Heart is at the upper limits of normal in size to mildly enlarged. No pericardial effusion. Mediastinum/Nodes: No pathologically enlarged mediastinal, hilar or axillary lymph nodes. Similar lower esophageal wall thickening. Lungs/Pleura: Minimal patchy mid and lower lung zone predominant coarsened interstitial and subpleural ground-glass with bronchiectasis, similar to 05/02/2023. Suspect post radiation scarring in the medial right lower lobe. No pleural fluid. Airway is unremarkable. Musculoskeletal: Degenerative changes in the spine.  No worrisome lytic or sclerotic lesions. Review of the MIP images confirms the above findings. CT ABDOMEN and PELVIS FINDINGS Hepatobiliary: New and enlarging hepatic metastases are seen predominantly in the right lobe of the liver. New mass in the dome of the right hepatic lobe measures 2.5 x 2.8 cm (2/9). Stones in the gallbladder. No biliary ductal dilatation. Pancreas: Negative. Spleen: Negative. Adrenals/Urinary Tract: Heterogeneous right adrenal mass measures 4.1 x 4.6 cm, increased slightly from 3.4 x 4.3 cm. Left  adrenal gland and right kidney are unremarkable. Low-attenuation lesions in the left kidney. No specific follow-up necessary. Small left renal stone. Ureters are decompressed. Bladder is low in volume. Stomach/Bowel: Small hiatal hernia. Small posterior gastric diverticulum. Stomach, small bowel, appendix and colon are otherwise unremarkable. Vascular/Lymphatic: Atherosclerotic calcification of the aorta. No pathologically enlarged lymph nodes. Reproductive: Prostate is visualized. Other: No free fluid.  Mesenteries and peritoneum are unremarkable. Musculoskeletal: Degenerative changes in the spine. Dextroconvex scoliosis. Review of the MIP images confirms the above findings. IMPRESSION: 1. Negative for pulmonary embolus. 2. Persistent lower esophageal wall thickening with progressive hepatic and right adrenal metastatic disease, compatible with stage IV esophageal carcinoma, as on PET 05/21/2023. 3. 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 4. 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 5. 4.0 cm ascending aortic aneurysm. Recommend annual imaging followup by CTA or MRA. This recommendation follows 2010 ACCF/AHA/AATS/ACR/ASA/SCA/SCAI/SIR/STS/SVM Guidelines for the Diagnosis and Management of Patients with Thoracic Aortic Disease. Circulation. 2010; 121: [id number]. Aortic aneurysm NOS (ICD10-I71.9). 6. Cholelithiasis. 7. Small left renal stone. 8. Aortic atherosclerosis (ICD10-I70.0). Left and descending coronary artery calcification. Electronically Signed   By: Leanna Battles M.D.   On: 06/19/2023 13:37   CT ABDOMEN PELVIS W CONTRAST  Result Date: 06/19/2023 CLINICAL DATA:  Esophageal cancer being treated with chemotherapy. Marked shortness of breath. Blood in urine. * Tracking Code: BO * EXAM: CT ANGIOGRAPHY CHEST CT  ABDOMEN AND PELVIS WITH CONTRAST TECHNIQUE: Multidetector CT imaging of the chest was performed using the standard protocol during bolus administration of intravenous contrast. Multiplanar CT image reconstructions and MIPs were obtained to evaluate the vascular anatomy. Multidetector CT imaging of the abdomen and pelvis was performed using the standard protocol during bolus administration of intravenous contrast. RADIATION DOSE REDUCTION: This exam was performed according to the departmental dose-optimization program which includes automated exposure control, adjustment of the mA and/or kV according to patient size and/or use of iterative reconstruction technique. CONTRAST:  OMNIPAQUE IOHEXOL 350 MG/ML SOLN COMPARISON:  PET 05/21/2023, CT chest abdomen pelvis 05/02/2023. FINDINGS: CTA CHEST FINDINGS Cardiovascular: Right IJ Port-A-Cath terminates in the low SVC. Negative for pulmonary embolus. Atherosclerotic calcification of the aorta and left anterior descending coronary artery. Ascending aorta measures up to 4.0 cm (7/63). Heart is at the upper limits of normal in size to mildly enlarged. No pericardial effusion. Mediastinum/Nodes: No pathologically enlarged mediastinal, hilar or axillary lymph nodes. Similar lower esophageal wall thickening. Lungs/Pleura: Minimal patchy mid and lower lung zone predominant coarsened interstitial and subpleural ground-glass with bronchiectasis, similar to 05/02/2023. Suspect post radiation scarring in the medial right lower lobe. No pleural fluid. Airway is unremarkable. Musculoskeletal: Degenerative changes in the spine. No worrisome lytic or sclerotic lesions. Review of the MIP images confirms the above findings. CT ABDOMEN and PELVIS FINDINGS Hepatobiliary: New and enlarging hepatic metastases are seen predominantly in the right lobe of the liver. New mass  in the dome of the right hepatic lobe measures 2.5 x 2.8 cm (2/9). Stones in the gallbladder. No biliary ductal  dilatation. Pancreas: Negative. Spleen: Negative. Adrenals/Urinary Tract: Heterogeneous right adrenal mass measures 4.1 x 4.6 cm, increased slightly from 3.4 x 4.3 cm. Left adrenal gland and right kidney are unremarkable. Low-attenuation lesions in the left kidney. No specific follow-up necessary. Small left renal stone. Ureters are decompressed. Bladder is low in volume. Stomach/Bowel: Small hiatal hernia. Small posterior gastric diverticulum. Stomach, small bowel, appendix and colon are otherwise unremarkable. Vascular/Lymphatic: Atherosclerotic calcification of the aorta. No pathologically enlarged lymph nodes. Reproductive: Prostate is visualized. Other: No free fluid.  Mesenteries and peritoneum are unremarkable. Musculoskeletal: Degenerative changes in the spine. Dextroconvex scoliosis. Review of the MIP images confirms the above findings. IMPRESSION: 1. Negative for pulmonary embolus. 2. Persistent lower esophageal wall thickening with progressive hepatic and right adrenal metastatic disease, compatible with stage IV esophageal carcinoma, as on PET 05/21/2023. 3. 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 4. 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 5. 4.0 cm ascending aortic aneurysm. Recommend annual imaging followup by CTA or MRA. This recommendation follows 2010 ACCF/AHA/AATS/ACR/ASA/SCA/SCAI/SIR/STS/SVM Guidelines for the Diagnosis and Management of Patients with Thoracic Aortic Disease. Circulation. 2010; 121: [id number]. Aortic aneurysm NOS (ICD10-I71.9). 6. Cholelithiasis. 7. Small left renal stone. 8. Aortic atherosclerosis (ICD10-I70.0). Left and descending coronary artery calcification. Electronically Signed   By: Leanna Battles M.D.   On: 06/19/2023 13:37    PERFORMANCE STATUS (ECOG) : 1 - Symptomatic but  completely ambulatory  Review of Systems Unless otherwise noted, a complete review of systems is negative.  Physical Exam General: NAD Pulmonary: Unlabored Extremities: no edema, no joint deformities Skin: no rashes Neurological: Weakness but otherwise nonfocal  IMPRESSION: Follow-up visit.  Patient seen in infusion.  CT scan 06/19/2023 showed progressive hepatic and right adrenal metastatic disease.  Patient was rotated second line FOLFIRI and has received first cycle.  Overall, patient says he is feeling poorly.  He is weak with minimal oral intake/appetite.  No improvement in postnasal drip/oral secretions with glycopyrrolate.  He feels like his postnasal drip is triggering his nausea.  Only taking antiemetics intermittently and recommended that he liberalize antiemetic regimen.  Patient tearful as he discussed needing to find a plan for end-of-life care as he lives at home alone.  We discussed future involvement of hospice, although he recognizes that they are not at home 24/7.  He may eventually benefit from hospice IPU.  Discussed CODE STATUS.  Patient stated clearly and repeatedly that he would not be interested in resuscitation prior to have his life prolonged artificially on machines.  He was in agreement DNR/DNI.  I signed a DNR order for him to take home.  PLAN: -Continue current scope of treatment -DNR/DNI -[facility] -Follow-up telephone visit 1 month  Case and plan discussed with Dr. Cathie Hoops  Patient expressed understanding and was in agreement with this plan. He also understands that He can call the clinic at any time with any questions, concerns, or complaints.     Time Total: 15 minutes  Visit consisted of counseling and education dealing with the complex and emotionally intense issues of symptom management and palliative care in the setting of serious and potentially life-threatening illness.Greater than 50%  of this time was spent counseling and coordinating care  related to the above assessment and plan.  Signed by: Laurette Schimke, PhD, NP-C

## 2023-07-07 NOTE — Assessment & Plan Note (Signed)
Grade 2, he prefers to hold of neuropathy treatments.  observation.

## 2023-07-07 NOTE — Assessment & Plan Note (Addendum)
Stage IV esophageal adenocarcinoma, liver metastatic disease, HER 2 negative. KRAS G12V, TMB 5.3, MSI Stable.  CPS 65%  1st line  FOLFOX , palliative RT --> 12/2022 CT progression--> switch to 5-FU + Nivolumab Q2 weeks  --> 02/2023 PET progression- pneumonitis--> 03/27/23  3rd line  Taxol and Cyramza --> 10/4 CT during admission showed slight decrease of liver lesion size. --> 10/30 PET obtained by radonc -liver lesion 10cm, bone mets. ? Progression.  Refer to [facility] for expert opinion. He declined.  Follow up with palliative care  GI bleeding due to esophageal cancer,  palliative RT CT showed progressive disease. New liver lesion 2.5 cm, enlarging liver masses. Increased right adrenal mass. - disease progression. Switch to next line treatment, recommend FOLFIRI.  Rationale and side effects were reviewed with patient.  Patient agrees with the plan Labs are reviewed and discussed with patient. He tolerated cycle 1  FOLFIRI with moderate difficulties He is getting weaker, lost more weight. Prognosis is poor. I recommend to continue monitor closely, and we may stop chemotherapy his condition declines further.  He will see palliative care today for further discussion of code status, end of life choices. Marland Kitchen

## 2023-07-07 NOTE — Progress Notes (Signed)
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 KRAS G12V, TMB 5.3, MSI Stable.  CPS 65%  1st line  FOLFOX , palliative RT --> 12/2022 CT progression--> switch to 5-FU + Nivolumab Q2 weeks  --> 02/2023 PET progression- pneumonitis--> 03/27/23  3rd line  Taxol and Cyramza --> 10/4 CT during admission showed slight decrease of liver lesion size. --> 10/30 PET obtained by radonc -liver lesion 10cm, bone mets. ? Progression.  Refer to [facility] for expert opinion. He declined.  Follow up with palliative care  GI bleeding due to esophageal cancer,  palliative RT CT showed progressive disease. New liver lesion 2.5 cm, enlarging liver masses. Increased right adrenal mass. - disease progression. Switch to next line treatment, recommend FOLFIRI.  Rationale and side effects were reviewed with patient.  Patient agrees with the plan Labs are reviewed and discussed with patient. He tolerated cycle 1  FOLFIRI with moderate difficulties He is getting weaker, lost more weight. Prognosis is poor. I recommend to continue monitor closely, and we may stop chemotherapy his condition declines further.  He will see palliative care today for further discussion of code status, end of life choices. .    Chemotherapy-induced neuropathy (HCC) Grade 2, he prefers to hold of neuropathy treatments.  observation.   Deep venous thrombosis (HCC) Off Eliquis 2.5mg  BID due to GI bleeding  Hypokalemia Chronic hypokalemia, normal magnesia level.  Cotinue KCL to  BID. 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  SOB cough symptoms are improved.  continue prednisone 40mg  daily     Sinus congestion Sinus congestion with postnasal drip which triggers cough. Recommend Flonase nasal spray as well as adding azelastine nasal spray Seen by ENT   Weight loss Due to poor oral intake.  IVF 1L NS today,  Follow up with nutritionist   Chemotherapy-induced nausea IV Zofran 8mg  x 1 today.  Continue home antiemetics PRN  Tachycardia Likely due to poor oral intake, deconditoning, dehydration. He gets IVF hydration today.  Sinus Tachycardia. 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 The patient knows to call the clinic with any problems, questions or concerns.  Rickard Patience, MD, PhD [facility] Hematology Oncology 07/07/2023   HISTORY OF PRESENTING ILLNESS:  Jonathan [name] 67 y.o. male presents to establish care for esophageal adenocarcinoma I have reviewed his chart and materials related to his cancer extensively and collaborated history with the patient. Summary of oncologic history is as follows: Oncology History  Adenocarcinoma of esophagus (HCC)  08/28/2022 Initial Diagnosis  Adenocarcinoma of esophagus   -Patient has noticed worsening of "food stuck/fullness" sensation since November 2023.  Patient had a barium swallow study which commented on marked mucosal irregularity in the distal esophagus with Broaddus base mural filling defect highly suspicious for malignancy.  Patient establish care  with gastroenterology. -08/23/2022, EGD showed gastritis and partially obstructing malignant esophageal tumor in the lower third of the esophagus. Esophagus mass biopsy showed adenocarcinoma.  PD-L1 TPS 65%, HER2 negative.  [facility] NGS showed KRASG12V, CDKN2A, ARID1A, TP53, TMB 5.3, MSI stable.  Stomach biopsy showed gastric mucosa with no specific histology abnormality.  No significant intestinal metaplastic, dysplastic, granular atrophy or increased inflammation.     08/28/2022 Imaging   CT chest abdomen pelvis with contrast showed 1. Distal esophageal primary with gastrohepatic ligament nodal metastasis. 2. 2 right-sided pulmonary nodules, the largest of which measures 5 mm and is new since 2015. Pulmonary metastasis not be excluded. 3. Anterior right lower lobe volume loss and minimal soft tissue density, favoring atelectasis or scar. Recommend attention on follow-up. 4. Hepatic steatosis 5. Cholelithiasis 6. Left nephrolithiasis 7. Coronary artery atherosclerosis. Aortic Atherosclerosis   08/28/2022 Cancer Staging   Staging form: Esophagus - Adenocarcinoma, AJCC 8th Edition - Clinical stage from 08/28/2022: Stage IVB (cT3, cN1, cM1) - Signed by Rickard Patience, MD on 10/04/2022 Stage prefix: Initial diagnosis   09/06/2022 Imaging   PET scan showed 1. Esophageal primary with gastrohepatic ligament nodal metastasis,as on CT. 2. Focus of hypermetabolism which is favored to registered to the posterior hepatic dome, in the region of subtle heterogeneity on prior diagnostic CT. Suboptimally evaluated secondary to underlying steatosis. Recommend further evaluation with pre and post contrast abdominal MRI to confirm probable metastasis. 3. Incidental findings, including: Left nephrolithiasis.Cholelithiasis. Coronary artery atherosclerosis. Aortic Atherosclerosis    09/23/2022 Imaging   MRI abdomen with and without contrast showed 1. Mildly T2 hyperintense segment VII hepatic lesion measuring 2.7 cm with  imaging characteristics compatible with metastatic disease. 2. Tiny focus of delayed enhancement in the inferior right lobe of the liver segment VI measuring 8 mm with ill-defined increased T2 signal and subtle corresponding reduced diffusivity, also suspicious for metastatic disease. 3. Partially visualized distal esophageal wall thickening compatible with the patient's known primary neoplasm. 4. Similar size of the 11 mm gastrohepatic ligament lymph node mildly metabolic on prior PET-CT and compatible with local nodal disease involvement. 5. 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 Recommend follow up pre and post-contrast MRI/MRCP in 1 year. 6. Diffuse hepatic steatosis.   10/10/2022 Procedure   LIVER MASS; CT-GUIDED BIOPSY:  - MODERATE TO POORLY DIFFERENTIATED ADENOCARCINOMA MORPHOLOGICALLY  CONSISTENT WITH METASTASIS FROM PATIENT'S KNOWN ESOPHAGEAL  ADENOCARCINOMA.    10/17/2022 Procedure   Medi port placed by Dr. Wyn Quaker   10/21/2022 - 01/15/2023 Chemotherapy   Patient is on Treatment Plan : ESOPHAGEAL ADENOCARCINOMA FOLFOX q14d x 6 cycles     01/15/2023 Imaging   PET showed  1. Interval progression in metastatic esophageal carcinoma as evidenced by hypermetabolic lymph nodes in the neck, chest, abdomen and pelvis, an enlarging right hepatic lobe metastasis, new bilateral adrenal metastases and new osseous metastases. 2. Cholelithiasis. 3. Left renal stones. 4. Aortic atherosclerosis (ICD10-I70.0). Coronary artery calcification.    01/27/2023 - 03/14/2023 Chemotherapy   Patient is on Treatment Plan : GASTROESOPHAGEAL FOLFOX + Nivolumab q14d     03/20/2023 Imaging   CT chest abdomen pelvis w contrast showed 1. Interval  progression of metastatic disease with multiple new and enlarged hypodense lesions throughout the liver. 2. Significant enlargement of bilateral adrenal metastases. 3. Subtle  sclerosis of an osseous metastasis of the right femoral neck. Other previously FDG avid osseous metastases of the left femoral neck and right sixth rib not appreciated by CT. 4. No persistently enlarged lymph nodes. 5. Interval development of bandlike consolidation and fibrosis of the paramedian right lung, particularly of the right lower lobe, as well as additional scattered irregular opacities throughout the right lung. 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 6. Similar circumferential wall thickening throughout the mid to lower esophagus, consistent with known primary esophageal adenocarcinoma. 7. Nonobstructive bilateral nephrolithiasis. Aortic Atherosclerosis   03/27/2023 - 06/19/2023 Chemotherapy   Patient is on Treatment Plan : GASTROESOPHAGEAL Ramucirumab D1, 15 + Paclitaxel D1,8,15 q28d     05/02/2023 - 05/08/2023 Hospital Admission   Admission due to coffee-ground emesis. EGD 10/7 showed bleeding from esophageal cancer, esophagitis due to chemo and radiation. Applied Hemospray. Eliquis was stopped. Acute blood loss anemia, received IV venofer treatments x 3 doses    05/28/2023 Imaging   1. Progressive hypermetabolism in the distal esophagus consistent with progressive disease. 2. Significant progression of metastatic hepatic disease (new and enlarging lesions). 3. Enlarging and progressively hypermetabolic right adrenal gland mass. 4. New hypermetabolic bone lesions in the T12 and L2 vertebral bodies, left sacrum and right sixth posterior rib consistent with metastatic disease. 5. Possible hypermetabolic brain lesions. MRI suggested for further evaluation. 6. Chronic medial right lower lobe atelectasis likely radiation change. Moderate hypermetabolism but no obvious tumor. 7. Stable gallstones, renal calculi and vascular calcifications.   06/19/2023 Imaging   CT chest angiogram w contrast, CT abdomen pelvis w contrast  1. Negative for  pulmonary embolus. 2. Persistent lower esophageal wall thickening with progressive hepatic and right adrenal metastatic disease, compatible with stage IV esophageal carcinoma, as on PET 05/21/2023. 3. 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  4. 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 5. 4.0 cm ascending aortic aneurysm. Recommend annual imaging follow up by CTA or MRA. This recommendation follows 2010 ACCF/AHA/AATS/ACR/ASA/SCA/SCAI/SIR/STS/SVM Guidelines for the Diagnosis and Management of Patients with Thoracic Aortic Disease.Circulation. 2010; 121: N829-F621. Aortic aneurysm NOS (ICD10-I71.9). 6. Cholelithiasis. 7. Small left renal stone. 8. Aortic atherosclerosis (ICD10-I70.0). Left and descending coronary artery calcification.   06/30/2023 -  Chemotherapy   Patient is on Treatment Plan : COLORECTAL FOLFIRI q14d     Patient presents to establish care.  He is not taking PPI. He has intentionally lost some weight. Family history positive for father and paternal uncle with prostate cancer and sister with breast cancer. Denies any routine alcohol use  Right interval jugular vein occlusive DVT, started on Eliquis starter package on 10/28/2022, swelling of neck has improved.   CXR showed Interval development of bilateral perihilar interstitial opacities  He was treated with Azithromycin and Doxycycline to cover atypical infections.   INTERVAL HISTORY Jonathan [name] is a 67 y.o. male who has above history reviewed by me today presents for follow up visit for Stage IV esophageal adenocarcinoma.  No additionally hematemesis episode.  He continues to have severe nasal congestion, postnatal drip, mucus production which causes nausea.  His appetite is poor and he has lost weight. He feels full after  couple bites.  currently on Prednisone 40mg  daily. Chronic  SOB/ cough. He takes PPI.  Some nausea today.   + neuropathy of finger tips, toes/bottom of feet. "Rubbery"  S/p palliative radiation to esophageal mass.  No chest pain.    MEDICAL HISTORY:  Past Medical History:  Diagnosis Date   Arthritis    Complication of anesthesia    OCCURRED ONCE YEARS AGO 1981   Esophageal mass    Hypertension    Hypothyroidism    PONV (postoperative nausea and vomiting)     SURGICAL HISTORY: Past Surgical History:  Procedure Laterality Date   COLONOSCOPY     ESOPHAGOGASTRODUODENOSCOPY N/A 08/23/2022   Procedure: ESOPHAGOGASTRODUODENOSCOPY (EGD);  Surgeon: Jaynie Collins, DO;  Location: [facility] ENDOSCOPY;  Service: Gastroenterology;  Laterality: N/A;   ESOPHAGOGASTRODUODENOSCOPY (EGD) WITH PROPOFOL N/A 05/05/2023   Procedure: ESOPHAGOGASTRODUODENOSCOPY (EGD) WITH PROPOFOL;  Surgeon: Jaynie Collins, DO;  Location: [facility] ENDOSCOPY;  Service: Gastroenterology;  Laterality: N/A;   EUS N/A 09/12/2022   Procedure: FULL UPPER ENDOSCOPIC ULTRASOUND (EUS) RADIAL;  Surgeon: Bearl Mulberry, MD;  Location: [facility] ENDOSCOPY;  Service: Gastroenterology;  Laterality: N/A;   FINGER SURGERY     HEMOSTASIS CONTROL  05/05/2023   Procedure: HEMOSTASIS CONTROL;  Surgeon: Jaynie Collins, DO;  Location: [facility] ENDOSCOPY;  Service: Gastroenterology;;   PORTA CATH INSERTION N/A 10/17/2022   Procedure: PORTA CATH INSERTION;  Surgeon: Annice Needy, MD;  Location: [facility] INVASIVE CV LAB;  Service: Cardiovascular;  Laterality: N/A;   TENDON REPAIR IN LEFT KNEE      SOCIAL HISTORY: Social History   Socioeconomic History   Marital status: Single    Spouse name: Not on file   Number of children: Not on file   Years of education: Not on file   Highest education level: Not on file  Occupational History   Not on file  Tobacco Use   Smoking status: Never   Smokeless tobacco: Never  Vaping Use   Vaping  status: Never Used  Substance and Sexual Activity   Alcohol use: Yes    Comment: OCCASIONALLY   Drug use: Never   Sexual activity: Not on file  Other Topics Concern   Not on file  Social History Narrative   Not on file   Social Determinants of Health   Financial Resource Strain: Low Risk  (10/29/2022)   [facility]   Overall Financial Resource Strain (CARDIA)    Difficulty of Paying Living Expenses: Not hard at all  Food Insecurity: No Food Insecurity (05/30/2023)   Hunger Vital Sign    Worried About Running Out of Food in the Last Year: Never true    Ran Out of Food in the Last Year: Never true  Transportation Needs: No Transportation Needs (05/30/2023)   PRAPARE - Administrator, Civil Service (Medical): No    Lack of Transportation (Non-Medical): No  Physical Activity: Not on file  Stress: Not on file  Social Connections: Not on file  Intimate Partner Violence: Not At Risk (05/30/2023)   Humiliation, Afraid, Rape, and Kick questionnaire    Fear of Current or Ex-Partner: No    Emotionally Abused: No    Physically Abused: No    Sexually Abused: No    FAMILY HISTORY: Family History  Problem Relation Age of Onset   Heart attack Mother    Prostate cancer Father    Breast cancer Sister    Prostate cancer Paternal Uncle     ALLERGIES:  has No Known Allergies.  MEDICATIONS:  Current Outpatient Medications  Medication Sig Dispense Refill   acetaminophen (TYLENOL) 650 MG CR tablet Take 1,300 mg by mouth every 8 (eight) hours as needed for pain.     azelastine (ASTELIN) 0.1 % nasal spray Place 1 spray into both nostrils 2 (two) times daily. Use in each nostril as directed 30 mL 1   benzonatate (TESSALON) 200 MG capsule Take 1 capsule (200 mg total) by mouth 3 (three) times daily as needed for cough. 20 capsule 5   chlorhexidine (PERIDEX) 0.12 % solution USE AS DIRECTED TAKE  15  ML  IN  THE  MOUTH  OR   THROAT  TWICE  DAILY 473 mL 0   Cholecalciferol (VITAMIN D3) 125 MCG (5000 UT) CAPS Take 5,000 Units by mouth daily.     glycopyrrolate (ROBINUL) 1 MG tablet Take 0.5 tablets (0.5 mg total) by mouth 2 (two) times daily as needed (excess secretions). 30 tablet 0   levothyroxine (SYNTHROID) 125 MCG tablet Take 125 mcg by mouth every morning.     loperamide (IMODIUM) 2 MG capsule Take 2 tabs by mouth with first loose stool, then 1 tab with each additional loose stool as needed. Do not exceed 8 tabs in a 24-hour period 90 capsule 0   magic mouthwash (multi-ingredient) oral suspension Swish and spit 5-10 mLs by mouth 4 (four) times daily as needed. 480 mL 1   Multiple Vitamins-Minerals (MULTIVITAMIN WITH MINERALS) tablet Take 1 tablet by mouth daily.     ondansetron (ZOFRAN) 8 MG tablet Take 1 tablet (8 mg total) by mouth every 8 (eight) hours as needed for nausea or vomiting. Start on the third day after chemotherapy. 90 tablet 1   pantoprazole (PROTONIX) 40 MG tablet Take 1 tablet (40 mg total) by mouth 2 (two) times daily. 60 tablet 1   potassium chloride SA (KLOR-CON M) 20 MEQ tablet Take 2 tablets (40 mEq total) by mouth 2 (two) times daily. 60 tablet 1   predniSONE (DELTASONE) 20 MG tablet Take 2 tablets (40 mg total) by mouth daily with breakfast. 60 tablet 0   prochlorperazine (COMPAZINE) 10 MG tablet Take 1 tablet (10 mg total) by mouth every 6 (six) hours as needed for nausea or vomiting. 90 tablet 1   No current facility-administered medications for this visit.    Review of Systems  Constitutional:  Negative for appetite change, chills, fatigue and fever.  HENT:   Negative for hearing loss and voice change.        Nasal congestion, postnasal drip.   Eyes:  Negative for eye problems and icterus.  Respiratory:  Negative for chest tightness, cough and shortness of breath.   Cardiovascular:  Negative for chest pain and leg swelling.  Gastrointestinal:  Negative for abdominal distention,  abdominal pain, nausea and vomiting.  Endocrine: Negative for hot flashes.  Genitourinary:  Negative for difficulty urinating, dysuria and frequency.   Musculoskeletal:  Negative for arthralgias.  Skin:  Negative for itching and rash.  Neurological:  Positive for numbness. Negative for light-headedness.  Hematological:  Negative for adenopathy. Does not bruise/bleed easily.  Psychiatric/Behavioral:  Negative for confusion.      PHYSICAL EXAMINATION: ECOG PERFORMANCE STATUS: 0 - Asymptomatic  Vitals:   07/07/23 0844  BP: (!) 130/94  Pulse: (!) 120  Resp: 18  Temp: (!) 96 F (35.6 C)  SpO2: 97%   Filed Weights   07/07/23 0844  Weight: 179 lb 3.2 oz (81.3 kg)    Physical Exam Constitutional:  General: He is not in acute distress.    Appearance: He is obese. He is not diaphoretic.  HENT:     Head: Normocephalic and atraumatic.  Eyes:     General: No scleral icterus. Cardiovascular:     Rate and Rhythm: Normal rate and regular rhythm.  Pulmonary:     Effort: Pulmonary effort is normal. No respiratory distress.     Breath sounds: No wheezing.  Abdominal:     General: There is no distension.     Palpations: Abdomen is soft.  Musculoskeletal:        General: Normal range of motion.     Cervical back: Normal range of motion and neck supple.  Skin:    General: Skin is warm and dry.     Findings: No erythema.  Neurological:     Mental Status: He is alert and oriented to person, place, and time. Mental status is at baseline.     Cranial Nerves: No cranial nerve deficit.     Motor: No abnormal muscle tone.  Psychiatric:        Mood and Affect: Mood and affect normal.      LABORATORY DATA:  I have reviewed the data as listed    Latest Ref Rng & Units 07/07/2023    8:26 AM 06/30/2023    8:45 AM 06/19/2023    9:42 AM  CBC  WBC 4.0 - 10.5 K/uL 5.3  11.6  2.3   Hemoglobin 13.0 - 17.0 g/dL 16.1  09.6  04.5   Hematocrit 39.0 - 52.0 % 40.5  39.7  35.7   Platelets  150 - 400 K/uL 197  256  270       Latest Ref Rng & Units 07/07/2023    8:26 AM 06/30/2023    8:45 AM 06/19/2023    9:42 AM  CMP  Glucose 70 - 99 mg/dL 409  811  914   BUN 8 - 23 mg/dL 18  18  12    Creatinine 0.61 - 1.24 mg/dL 7.82  9.56  2.13   Sodium 135 - 145 mmol/L 137  139  141   Potassium 3.5 - 5.1 mmol/L 3.1  3.3  3.4   Chloride 98 - 111 mmol/L 99  104  102   CO2 22 - 32 mmol/L 22  23  24    Calcium 8.9 - 10.3 mg/dL 9.4  9.1  9.4   Total Protein 6.5 - 8.1 g/dL 7.3  7.2  7.3   Total Bilirubin <1.2 mg/dL 1.0  0.5  0.7   Alkaline Phos 38 - 126 U/L 55  57  61   AST 15 - 41 U/L 29  25  27    ALT 0 - 44 U/L 26  27  24       RADIOGRAPHIC STUDIES: I have personally reviewed the radiological images as listed and agreed with the findings in the report. CT Angio Chest Pulmonary Embolism (PE) W or WO Contrast  Result Date: 06/19/2023 CLINICAL DATA:  Esophageal cancer being treated with chemotherapy. Marked shortness of breath. Blood in urine. * Tracking Code: BO * EXAM: CT ANGIOGRAPHY CHEST CT ABDOMEN AND PELVIS WITH CONTRAST TECHNIQUE: Multidetector CT imaging of the chest was performed using the standard protocol during bolus administration of intravenous contrast. Multiplanar CT image reconstructions and MIPs were obtained to evaluate the vascular anatomy. Multidetector CT imaging of the abdomen and pelvis was performed using the standard protocol during bolus administration of intravenous contrast. RADIATION DOSE REDUCTION: This exam was  performed according to the departmental dose-optimization program which includes automated exposure control, adjustment of the mA and/or kV according to patient size and/or use of iterative reconstruction technique. CONTRAST:  OMNIPAQUE IOHEXOL 350 MG/ML SOLN COMPARISON:  PET 05/21/2023, CT chest abdomen pelvis 05/02/2023. FINDINGS: CTA CHEST FINDINGS Cardiovascular: Right IJ Port-A-Cath terminates in the low SVC. Negative for pulmonary embolus.  Atherosclerotic calcification of the aorta and left anterior descending coronary artery. Ascending aorta measures up to 4.0 cm (7/63). Heart is at the upper limits of normal in size to mildly enlarged. No pericardial effusion. Mediastinum/Nodes: No pathologically enlarged mediastinal, hilar or axillary lymph nodes. Similar lower esophageal wall thickening. Lungs/Pleura: Minimal patchy mid and lower lung zone predominant coarsened interstitial and subpleural ground-glass with bronchiectasis, similar to 05/02/2023. Suspect post radiation scarring in the medial right lower lobe. No pleural fluid. Airway is unremarkable. Musculoskeletal: Degenerative changes in the spine. No worrisome lytic or sclerotic lesions. Review of the MIP images confirms the above findings. CT ABDOMEN and PELVIS FINDINGS Hepatobiliary: New and enlarging hepatic metastases are seen predominantly in the right lobe of the liver. New mass in the dome of the right hepatic lobe measures 2.5 x 2.8 cm (2/9). Stones in the gallbladder. No biliary ductal dilatation. Pancreas: Negative. Spleen: Negative. Adrenals/Urinary Tract: Heterogeneous right adrenal mass measures 4.1 x 4.6 cm, increased slightly from 3.4 x 4.3 cm. Left adrenal gland and right kidney are unremarkable. Low-attenuation lesions in the left kidney. No specific follow-up necessary. Small left renal stone. Ureters are decompressed. Bladder is low in volume. Stomach/Bowel: Small hiatal hernia. Small posterior gastric diverticulum. Stomach, small bowel, appendix and colon are otherwise unremarkable. Vascular/Lymphatic: Atherosclerotic calcification of the aorta. No pathologically enlarged lymph nodes. Reproductive: Prostate is visualized. Other: No free fluid.  Mesenteries and peritoneum are unremarkable. Musculoskeletal: Degenerative changes in the spine. Dextroconvex scoliosis. Review of the MIP images confirms the above findings. IMPRESSION: 1. Negative for pulmonary embolus. 2. Persistent  lower esophageal wall thickening with progressive hepatic and right adrenal metastatic disease, compatible with stage IV esophageal carcinoma, as on PET 05/21/2023. 3. 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 4. 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 5. 4.0 cm ascending aortic aneurysm. Recommend annual imaging followup by CTA or MRA. This recommendation follows 2010 ACCF/AHA/AATS/ACR/ASA/SCA/SCAI/SIR/STS/SVM Guidelines for the Diagnosis and Management of Patients with Thoracic Aortic Disease. Circulation. 2010; 121: B147-W295. Aortic aneurysm NOS (ICD10-I71.9). 6. Cholelithiasis. 7. Small left renal stone. 8. Aortic atherosclerosis (ICD10-I70.0). Left and descending coronary artery calcification. Electronically Signed   By: Leanna Battles M.D.   On: 06/19/2023 13:37   CT ABDOMEN PELVIS W CONTRAST  Result Date: 06/19/2023 CLINICAL DATA:  Esophageal cancer being treated with chemotherapy. Marked shortness of breath. Blood in urine. * Tracking Code: BO * EXAM: CT ANGIOGRAPHY CHEST CT ABDOMEN AND PELVIS WITH CONTRAST TECHNIQUE: Multidetector CT imaging of the chest was performed using the standard protocol during bolus administration of intravenous contrast. Multiplanar CT image reconstructions and MIPs were obtained to evaluate the vascular anatomy. Multidetector CT imaging of the abdomen and pelvis was performed using the standard protocol during bolus administration of intravenous contrast. RADIATION DOSE REDUCTION: This exam was performed according to the departmental dose-optimization program which includes automated exposure control, adjustment of the mA and/or kV according to patient size and/or use of iterative reconstruction technique. CONTRAST:  OMNIPAQUE IOHEXOL 350 MG/ML SOLN COMPARISON:  PET  05/21/2023, CT chest abdomen pelvis 05/02/2023. FINDINGS: CTA CHEST FINDINGS Cardiovascular: Right IJ Port-A-Cath terminates in the low SVC. Negative for pulmonary embolus. Atherosclerotic calcification of the aorta and left anterior descending coronary artery. Ascending aorta measures up to 4.0 cm (7/63). Heart is at the upper limits of normal in size to mildly enlarged. No pericardial effusion. Mediastinum/Nodes: No pathologically enlarged mediastinal, hilar or axillary lymph nodes. Similar lower esophageal wall thickening. Lungs/Pleura: Minimal patchy mid and lower lung zone predominant coarsened interstitial and subpleural ground-glass with bronchiectasis, similar to 05/02/2023. Suspect post radiation scarring in the medial right lower lobe. No pleural fluid. Airway is unremarkable. Musculoskeletal: Degenerative changes in the spine. No worrisome lytic or sclerotic lesions. Review of the MIP images confirms the above findings. CT ABDOMEN and PELVIS FINDINGS Hepatobiliary: New and enlarging hepatic metastases are seen predominantly in the right lobe of the liver. New mass in the dome of the right hepatic lobe measures 2.5 x 2.8 cm (2/9). Stones in the gallbladder. No biliary ductal dilatation. Pancreas: Negative. Spleen: Negative. Adrenals/Urinary Tract: Heterogeneous right adrenal mass measures 4.1 x 4.6 cm, increased slightly from 3.4 x 4.3 cm. Left adrenal gland and right kidney are unremarkable. Low-attenuation lesions in the left kidney. No specific follow-up necessary. Small left renal stone. Ureters are decompressed. Bladder is low in volume. Stomach/Bowel: Small hiatal hernia. Small posterior gastric diverticulum. Stomach, small bowel, appendix and colon are otherwise unremarkable. Vascular/Lymphatic: Atherosclerotic calcification of the aorta. No pathologically enlarged lymph nodes. Reproductive: Prostate is visualized. Other: No free fluid.  Mesenteries and peritoneum are unremarkable. Musculoskeletal:  Degenerative changes in the spine. Dextroconvex scoliosis. Review of the MIP images confirms the above findings. IMPRESSION: 1. Negative for pulmonary embolus. 2. Persistent lower esophageal wall thickening with progressive hepatic and right adrenal metastatic disease, compatible with stage IV esophageal carcinoma, as on PET 05/21/2023. 3. 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 4. 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 5. 4.0 cm ascending aortic aneurysm. Recommend annual imaging followup by CTA or MRA. This recommendation follows 2010 ACCF/AHA/AATS/ACR/ASA/SCA/SCAI/SIR/STS/SVM Guidelines for the Diagnosis and Management of Patients with Thoracic Aortic Disease. Circulation. 2010; 121: [id number]. Aortic aneurysm NOS (ICD10-I71.9). 6. Cholelithiasis. 7. Small left renal stone. 8. Aortic atherosclerosis (ICD10-I70.0). Left and descending coronary artery calcification. Electronically Signed   By: Leanna Battles M.D.   On: 06/19/2023 13:37

## 2023-07-07 NOTE — Assessment & Plan Note (Signed)
Chronic hypokalemia, normal magnesia level.  Cotinue KCL to BID. Marland Kitchen  He will receive IV KCL today

## 2023-07-07 NOTE — Assessment & Plan Note (Signed)
 Due to radiation/immunotherapy.  SOB cough symptoms are improved.  continue prednisone 40mg  daily

## 2023-07-07 NOTE — Assessment & Plan Note (Signed)
IV Zofran 8mg  x 1 today.  Continue home antiemetics PRN

## 2023-07-07 NOTE — Assessment & Plan Note (Signed)
Sinus congestion with postnasal drip which triggers cough. Recommend Flonase nasal spray as well as adding azelastine nasal spray Seen by ENT

## 2023-07-08 ENCOUNTER — Telehealth: Payer: Self-pay | Admitting: *Deleted

## 2023-07-08 DIAGNOSIS — M9902 Segmental and somatic dysfunction of thoracic region: Secondary | ICD-10-CM | POA: Diagnosis not present

## 2023-07-08 DIAGNOSIS — M9901 Segmental and somatic dysfunction of cervical region: Secondary | ICD-10-CM | POA: Diagnosis not present

## 2023-07-08 DIAGNOSIS — M9903 Segmental and somatic dysfunction of lumbar region: Secondary | ICD-10-CM | POA: Diagnosis not present

## 2023-07-08 DIAGNOSIS — M62830 Muscle spasm of back: Secondary | ICD-10-CM | POA: Diagnosis not present

## 2023-07-08 MED ORDER — OLANZAPINE 10 MG PO TABS: 5.0000 mg | ORAL_TABLET | Freq: Every evening | ORAL | 3 refills | Status: DC | PRN

## 2023-07-08 NOTE — Telephone Encounter (Signed)
Raynelle Fanning, NP with [facility] is asking to speak with Elouise Munroe, NP about his patient nausea and medicine for it. Please return her call [phone]

## 2023-07-08 NOTE — Telephone Encounter (Signed)
I spoke with Raynelle Fanning. Agreed with her recommendation for starting olanzapine prn at bedtime to see if this helps nausea. Rx sent.

## 2023-07-08 NOTE — Addendum Note (Signed)
Addended by: Laurette Schimke R on: 07/08/2023 11:10 AM   Modules accepted: Orders

## 2023-07-10 ENCOUNTER — Encounter: Payer: Self-pay | Admitting: Oncology

## 2023-07-14 ENCOUNTER — Inpatient Hospital Stay (HOSPITAL_BASED_OUTPATIENT_CLINIC_OR_DEPARTMENT_OTHER): Payer: No Typology Code available for payment source | Admitting: Oncology

## 2023-07-14 ENCOUNTER — Encounter: Payer: Self-pay | Admitting: Oncology

## 2023-07-14 ENCOUNTER — Ambulatory Visit: Payer: No Typology Code available for payment source

## 2023-07-14 ENCOUNTER — Inpatient Hospital Stay: Payer: No Typology Code available for payment source

## 2023-07-14 ENCOUNTER — Other Ambulatory Visit: Payer: No Typology Code available for payment source

## 2023-07-14 VITALS — BP 113/89 | HR 122 | Temp 97.5°F | Resp 20 | Wt 180.5 lb

## 2023-07-14 DIAGNOSIS — E876 Hypokalemia: Secondary | ICD-10-CM | POA: Diagnosis not present

## 2023-07-14 DIAGNOSIS — T451X5A Adverse effect of antineoplastic and immunosuppressive drugs, initial encounter: Secondary | ICD-10-CM | POA: Diagnosis not present

## 2023-07-14 DIAGNOSIS — R000 Tachycardia, unspecified: Secondary | ICD-10-CM | POA: Diagnosis not present

## 2023-07-14 DIAGNOSIS — M899 Disorder of bone, unspecified: Secondary | ICD-10-CM | POA: Diagnosis not present

## 2023-07-14 DIAGNOSIS — R634 Abnormal weight loss: Secondary | ICD-10-CM | POA: Diagnosis not present

## 2023-07-14 DIAGNOSIS — I82621 Acute embolism and thrombosis of deep veins of right upper extremity: Secondary | ICD-10-CM | POA: Diagnosis not present

## 2023-07-14 DIAGNOSIS — C159 Malignant neoplasm of esophagus, unspecified: Secondary | ICD-10-CM

## 2023-07-14 DIAGNOSIS — Z5111 Encounter for antineoplastic chemotherapy: Secondary | ICD-10-CM | POA: Diagnosis not present

## 2023-07-14 DIAGNOSIS — J984 Other disorders of lung: Secondary | ICD-10-CM | POA: Diagnosis not present

## 2023-07-14 DIAGNOSIS — G620 Drug-induced polyneuropathy: Secondary | ICD-10-CM

## 2023-07-14 DIAGNOSIS — R29898 Other symptoms and signs involving the musculoskeletal system: Secondary | ICD-10-CM | POA: Diagnosis not present

## 2023-07-14 LAB — CMP (CANCER CENTER ONLY)
ALT: 21 U/L (ref 0–44)
AST: 24 U/L (ref 15–41)
Albumin: 3.8 g/dL (ref 3.5–5.0)
Alkaline Phosphatase: 56 U/L (ref 38–126)
Anion gap: 14 (ref 5–15)
BUN: 15 mg/dL (ref 8–23)
CO2: 24 mmol/L (ref 22–32)
Calcium: 9.7 mg/dL (ref 8.9–10.3)
Chloride: 103 mmol/L (ref 98–111)
Creatinine: 1.11 mg/dL (ref 0.61–1.24)
GFR, Estimated: >60 mL/min (ref >60)
Glucose, Bld: 106 mg/dL — ABNORMAL HIGH (ref 70–99)
Potassium: 3.4 mmol/L — ABNORMAL LOW (ref 3.5–5.1)
Sodium: 141 mmol/L (ref 135–145)
Total Bilirubin: 0.9 mg/dL (ref <1.2)
Total Protein: 7.3 g/dL (ref 6.5–8.1)

## 2023-07-14 LAB — CBC WITH DIFFERENTIAL (CANCER CENTER ONLY)
Abs Immature Granulocytes: 0.01 10*3/uL (ref 0.00–0.07)
Basophils Absolute: 0 10*3/uL (ref 0.0–0.1)
Basophils Relative: 0 %
Eosinophils Absolute: 0 10*3/uL (ref 0.0–0.5)
Eosinophils Relative: 0 %
HCT: 39.6 % (ref 39.0–52.0)
Hemoglobin: 12.3 g/dL — ABNORMAL LOW (ref 13.0–17.0)
Immature Granulocytes: 0 %
Lymphocytes Relative: 17 %
Lymphs Abs: 0.8 10*3/uL (ref 0.7–4.0)
MCH: 27.5 pg (ref 26.0–34.0)
MCHC: 31.1 g/dL (ref 30.0–36.0)
MCV: 88.6 fL (ref 80.0–100.0)
Monocytes Absolute: 0.4 10*3/uL (ref 0.1–1.0)
Monocytes Relative: 9 %
Neutro Abs: 3.5 10*3/uL (ref 1.7–7.7)
Neutrophils Relative %: 74 %
Platelet Count: 236 10*3/uL (ref 150–400)
RBC: 4.47 MIL/uL (ref 4.22–5.81)
RDW: 18 % — ABNORMAL HIGH (ref 11.5–15.5)
WBC Count: 4.7 10*3/uL (ref 4.0–10.5)
nRBC: 0 % (ref 0.0–0.2)

## 2023-07-14 MED ORDER — SODIUM CHLORIDE 0.9% FLUSH
10.0000 mL | Freq: Once | INTRAVENOUS | Status: AC
Start: 2023-07-14 — End: 2023-07-14
  Administered 2023-07-14: 10 mL via INTRAVENOUS
  Filled 2023-07-14: qty 10

## 2023-07-14 MED ORDER — HEPARIN SOD (PORK) LOCK FLUSH 100 UNIT/ML IV SOLN
500.0000 [IU] | Freq: Once | INTRAVENOUS | Status: AC
  Administered 2023-07-14: 500 [IU] via INTRAVENOUS
  Filled 2023-07-14: qty 5

## 2023-07-14 MED ORDER — SODIUM CHLORIDE 0.9 % IV SOLN
Freq: Once | INTRAVENOUS | Status: AC
Start: 2023-07-14 — End: 2023-07-14
  Filled 2023-07-14: qty 250

## 2023-07-14 NOTE — Assessment & Plan Note (Addendum)
Chronic hypokalemia, normal magnesia level.  Cotinue KCL to BID. Marland Kitchen

## 2023-07-14 NOTE — Assessment & Plan Note (Signed)
 Due to radiation/immunotherapy.  SOB cough symptoms are improved.  continue prednisone 40mg  daily

## 2023-07-14 NOTE — Progress Notes (Signed)
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 KRAS G12V, TMB 5.3, MSI Stable.  CPS 65%  1st line  FOLFOX , palliative RT --> 12/2022 CT progression--> switch to 5-FU + Nivolumab Q2 weeks  --> 02/2023 PET progression- pneumonitis--> 03/27/23  3rd line  Taxol and Cyramza --> 10/4 CT during admission showed slight decrease of liver lesion size. --> 10/30 PET obtained by radonc -liver lesion 10cm, bone mets. ? Progression.  Refer to [facility] for expert opinion. He declined.  Follow up with palliative care  GI bleeding due to esophageal cancer, s/p  palliative RT CT showed progressive disease. New liver lesion 2.5 cm, enlarging liver masses. Increased right adrenal mass. - disease progression.  Labs are reviewed and discussed with patient. S/p cycle 1  FOLFIRI with moderate difficulties Hold treatment due to tachycardia   Hypokalemia Chronic hypokalemia, normal magnesia level.  Cotinue KCL to BID. 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  SOB cough symptoms are improved.  continue prednisone 40mg  daily     Tachycardia Likely due to poor oral intake, deconditoning, dehydration. He gets IVF hydration today.  Sinus Tachycardia. 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  IVF 1L NS today,  Follow up with nutritionist   Weakness of both legs Refer to physical therapy He has spine lesions, will obtain MRI thoracic lumbar spine w w contrast    Chemotherapy-induced neuropathy (HCC) Grade 2, he prefers to hold of neuropathy treatments.  observation.   Deep venous thrombosis (HCC) Off Eliquis 2.5mg  BID due to GI bleeding  Encounter for antineoplastic chemotherapy Chemotherapy plan as listed above.    Orders Placed This Encounter  Procedures   MR Thoracic Spine W Wo Contrast    Standing Status:   Future    Expected Date:   07/21/2023    Expiration Date:   07/13/2024    GRA to provide read?:   Yes    If indicated for the ordered procedure, I authorize the administration of contrast media per Radiology protocol:   Yes    What is the patient's sedation requirement?:   No Sedation    Use SRS Protocol?:   No    Does the patient have a pacemaker or implanted devices?:   No    Preferred imaging location?:   [facility] (table limit - 550lbs)   MR Lumbar Spine W Wo Contrast    Standing Status:   Future    Expected Date:   07/21/2023    Expiration Date:   07/13/2024    If indicated for the ordered procedure, I authorize the administration of contrast media per Radiology protocol:   Yes    What is the patient's sedation requirement?:   No Sedation    Does the patient have a pacemaker or implanted devices?:   No    Use SRS Protocol?:   No    Preferred imaging location?:   [facility] (table limit - 550lbs)   CEA    Standing Status:   Future  Expected Date:   07/28/2023    Expiration Date:   07/27/2024   CBC with Differential (Cancer Center Only)    Standing Status:   Future    Expected Date:   07/28/2023    Expiration Date:   07/27/2024   CMP (Cancer Center only)    Standing Status:   Future    Expected Date:   07/28/2023    Expiration Date:   07/27/2024   Ambulatory referral to Physical Therapy    Referral Priority:   Routine    Referral Type:   Physical Medicine    Referral Reason:   Specialty Services Required    Requested Specialty:   Physical Therapy    Number of Visits Requested:   1     Follow-up LOS  All questions were answered. The patient knows to call the clinic with any problems, questions or concerns.  Rickard Patience, MD, PhD [facility] Hematology Oncology 07/14/2023   HISTORY OF PRESENTING ILLNESS:  Jonathan [name] 67 y.o. male presents to establish care for esophageal adenocarcinoma I have reviewed his chart and materials related to his cancer extensively and collaborated history with the patient. Summary of oncologic history is as follows: Oncology History  Adenocarcinoma of esophagus (HCC)  08/28/2022 Initial Diagnosis   Adenocarcinoma of esophagus   -Patient has noticed worsening of "food stuck/fullness" sensation since November 2023.  Patient had a barium swallow study which commented on marked mucosal irregularity in the distal esophagus with Broaddus base mural filling defect highly suspicious for malignancy.  Patient establish care with gastroenterology. -08/23/2022, EGD showed gastritis and partially obstructing malignant esophageal tumor in the lower third of the esophagus. Esophagus mass biopsy showed adenocarcinoma.  PD-L1 TPS 65%, HER2 negative.  [facility] NGS showed KRASG12V, CDKN2A, ARID1A, TP53, TMB 5.3, MSI stable.  Stomach biopsy showed gastric mucosa with no specific histology abnormality.  No significant intestinal metaplastic, dysplastic, granular atrophy or increased inflammation.     08/28/2022 Imaging   CT chest abdomen pelvis with contrast showed 1. Distal esophageal primary with gastrohepatic ligament nodal metastasis. 2. 2 right-sided pulmonary nodules, the largest of which measures 5 mm and is new since 2015. Pulmonary metastasis not be excluded. 3. Anterior right lower lobe volume loss and minimal soft tissue density, favoring atelectasis or scar. Recommend attention on follow-up. 4. Hepatic steatosis 5. Cholelithiasis 6. Left nephrolithiasis 7. Coronary artery atherosclerosis. Aortic Atherosclerosis   08/28/2022 Cancer Staging    Staging form: Esophagus - Adenocarcinoma, AJCC 8th Edition - Clinical stage from 08/28/2022: Stage IVB (cT3, cN1, cM1) - Signed by Rickard Patience, MD on 10/04/2022 Stage prefix: Initial diagnosis   09/06/2022 Imaging   PET scan showed 1. Esophageal primary with gastrohepatic ligament nodal metastasis,as on CT. 2. Focus of hypermetabolism which is favored to registered to the posterior hepatic dome, in the region of subtle heterogeneity on prior diagnostic CT. Suboptimally evaluated secondary to underlying steatosis. Recommend further evaluation with pre and post contrast abdominal MRI to confirm probable metastasis. 3. Incidental findings, including: Left nephrolithiasis.Cholelithiasis. Coronary artery atherosclerosis. Aortic Atherosclerosis    09/23/2022 Imaging   MRI abdomen with and without contrast showed 1. Mildly T2 hyperintense segment VII hepatic lesion measuring 2.7 cm with imaging characteristics compatible with metastatic disease. 2. Tiny focus of delayed enhancement in the inferior right lobe of the liver segment VI measuring 8 mm with ill-defined increased T2 signal and subtle corresponding reduced diffusivity, also suspicious for metastatic disease. 3. Partially visualized distal esophageal wall thickening compatible with the patient's  known primary neoplasm. 4. Similar size of the 11 mm gastrohepatic ligament lymph node mildly metabolic on prior PET-CT and compatible with local nodal disease involvement. 5. 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 Recommend follow up pre and post-contrast MRI/MRCP in 1 year. 6. Diffuse hepatic steatosis.   10/10/2022 Procedure   LIVER MASS; CT-GUIDED BIOPSY:  - MODERATE TO POORLY DIFFERENTIATED ADENOCARCINOMA MORPHOLOGICALLY  CONSISTENT WITH METASTASIS FROM PATIENT'S KNOWN ESOPHAGEAL  ADENOCARCINOMA.    10/17/2022 Procedure   Medi port placed by Dr. Wyn Quaker   10/21/2022  - 01/15/2023 Chemotherapy   Patient is on Treatment Plan : ESOPHAGEAL ADENOCARCINOMA FOLFOX q14d x 6 cycles     01/15/2023 Imaging   PET showed  1. Interval progression in metastatic esophageal carcinoma as evidenced by hypermetabolic lymph nodes in the neck, chest, abdomen and pelvis, an enlarging right hepatic lobe metastasis, new bilateral adrenal metastases and new osseous metastases. 2. Cholelithiasis. 3. Left renal stones. 4. Aortic atherosclerosis (ICD10-I70.0). Coronary artery calcification.    01/27/2023 - 03/14/2023 Chemotherapy   Patient is on Treatment Plan : GASTROESOPHAGEAL FOLFOX + Nivolumab q14d     03/20/2023 Imaging   CT chest abdomen pelvis w contrast showed 1. Interval progression of metastatic disease with multiple new and enlarged hypodense lesions throughout the liver. 2. Significant enlargement of bilateral adrenal metastases. 3. Subtle sclerosis of an osseous metastasis of the right femoral neck. Other previously FDG avid osseous metastases of the left femoral neck and right sixth rib not appreciated by CT. 4. No persistently enlarged lymph nodes. 5. Interval development of bandlike consolidation and fibrosis of the paramedian right lung, particularly of the right lower lobe, as well as additional scattered irregular opacities throughout the right lung. 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 6. Similar circumferential wall thickening throughout the mid to lower esophagus, consistent with known primary esophageal adenocarcinoma. 7. Nonobstructive bilateral nephrolithiasis. Aortic Atherosclerosis   03/27/2023 - 06/19/2023 Chemotherapy   Patient is on Treatment Plan : GASTROESOPHAGEAL Ramucirumab D1, 15 + Paclitaxel D1,8,15 q28d     05/02/2023 - 05/08/2023 Hospital Admission   Admission due to coffee-ground emesis. EGD 10/7 showed bleeding from esophageal cancer, esophagitis due to chemo and radiation. Applied Hemospray.  Eliquis was stopped. Acute blood loss anemia, received IV venofer treatments x 3 doses    05/28/2023 Imaging   1. Progressive hypermetabolism in the distal esophagus consistent with progressive disease. 2. Significant progression of metastatic hepatic disease (new and enlarging lesions). 3. Enlarging and progressively hypermetabolic right adrenal gland mass. 4. New hypermetabolic bone lesions in the T12 and L2 vertebral bodies, left sacrum and right sixth posterior rib consistent with metastatic disease. 5. Possible hypermetabolic brain lesions. MRI suggested for further evaluation. 6. Chronic medial right lower lobe atelectasis likely radiation change. Moderate hypermetabolism but no obvious tumor. 7. Stable gallstones, renal calculi and vascular calcifications.   06/19/2023 Imaging   CT chest angiogram w contrast, CT abdomen pelvis w contrast  1. Negative for pulmonary embolus. 2. Persistent lower esophageal wall thickening with progressive hepatic and right adrenal metastatic disease, compatible with stage IV esophageal carcinoma, as on PET 05/21/2023. 3. 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  4. 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 5. 4.0 cm ascending aortic aneurysm. Recommend annual imaging follow up by  CTA or MRA. This recommendation follows 2010 ACCF/AHA/AATS/ACR/ASA/SCA/SCAI/SIR/STS/SVM Guidelines for the Diagnosis and Management of Patients with Thoracic Aortic Disease.Circulation. 2010; 121: [id number]. Aortic aneurysm NOS (ICD10-I71.9). 6. Cholelithiasis. 7. Small left renal stone. 8. Aortic atherosclerosis (ICD10-I70.0). Left and descending coronary artery calcification.   06/30/2023 -  Chemotherapy   Patient is on Treatment Plan : COLORECTAL FOLFIRI q14d     Patient presents  to establish care.  He is not taking PPI. He has intentionally lost some weight. Family history positive for father and paternal uncle with prostate cancer and sister with breast cancer. Denies any routine alcohol use  Right interval jugular vein occlusive DVT, started on Eliquis starter package on 10/28/2022, swelling of neck has improved.   CXR showed Interval development of bilateral perihilar interstitial opacities  He was treated with Azithromycin and Doxycycline to cover atypical infections.   INTERVAL HISTORY ZAYIR GANTERT is a 67 y.o. male who has above history reviewed by me today presents for follow up visit for Stage IV esophageal adenocarcinoma.  No additionally hematemesis episode.  He continues to have severe nasal congestion, postnatal drip, mucus production which causes nausea.  Appetite is slightly better after started on olanzapine.   currently on Prednisone 40mg  daily. Chronic SOB/ cough. He takes PPI.   + neuropathy of finger tips, toes/bottom of feet. "Rubbery" + weakness of bilateral lower extremities.  S/p palliative radiation to esophageal mass.     MEDICAL HISTORY:  Past Medical History:  Diagnosis Date   Arthritis    Complication of anesthesia    OCCURRED ONCE YEARS AGO 1981   Esophageal mass    Hypertension    Hypothyroidism    PONV (postoperative nausea and vomiting)     SURGICAL HISTORY: Past Surgical History:  Procedure Laterality Date   COLONOSCOPY     ESOPHAGOGASTRODUODENOSCOPY N/A 08/23/2022   Procedure: ESOPHAGOGASTRODUODENOSCOPY (EGD);  Surgeon: Jaynie Collins, DO;  Location: [facility] ENDOSCOPY;  Service: Gastroenterology;  Laterality: N/A;   ESOPHAGOGASTRODUODENOSCOPY (EGD) WITH PROPOFOL N/A 05/05/2023   Procedure: ESOPHAGOGASTRODUODENOSCOPY (EGD) WITH PROPOFOL;  Surgeon: Jaynie Collins, DO;  Location[facility] ENDOSCOPY;  Service: Gastroenterology;  Laterality: N/A;   EUS N/A 09/12/2022   Procedure: FULL UPPER ENDOSCOPIC ULTRASOUND  (EUS) RADIAL;  Surgeon: Bearl Mulberry, MD;  Location: [facility] ENDOSCOPY;  Service: Gastroenterology;  Laterality: N/A;   FINGER SURGERY     HEMOSTASIS CONTROL  05/05/2023   Procedure: HEMOSTASIS CONTROL;  Surgeon: Jaynie Collins, DO;  Location: [facility] ENDOSCOPY;  Service: Gastroenterology;;   PORTA CATH INSERTION N/A 10/17/2022   Procedure: PORTA CATH INSERTION;  Surgeon: Annice Needy, MD;  Location: [facility] INVASIVE CV LAB;  Service: Cardiovascular;  Laterality: N/A;   TENDON REPAIR IN LEFT KNEE      SOCIAL HISTORY: Social History   Socioeconomic History   Marital status: Single    Spouse name: Not on file   Number of children: Not on file   Years of education: Not on file   Highest education level: Not on file  Occupational History   Not on file  Tobacco Use   Smoking status: Never   Smokeless tobacco: Never  Vaping Use   Vaping status: Never Used  Substance and Sexual Activity   Alcohol use: Yes    Comment: OCCASIONALLY   Drug use: Never   Sexual activity: Not on file  Other Topics Concern   Not on file  Social History Narrative   Not on file   Social Drivers of Health   Financial  Resource Strain: Low Risk  (10/29/2022)   [facility]   Overall Financial Resource Strain (CARDIA)    Difficulty of Paying Living Expenses: Not hard at all  Food Insecurity: No Food Insecurity (05/30/2023)   Hunger Vital Sign    Worried About Running Out of Food in the Last Year: Never true    Ran Out of Food in the Last Year: Never true  Transportation Needs: No Transportation Needs (05/30/2023)   PRAPARE - Administrator, Civil Service (Medical): No    Lack of Transportation (Non-Medical): No  Physical Activity: Not on file  Stress: Not on file  Social Connections: Not on file  Intimate Partner Violence: Not At Risk (05/30/2023)   Humiliation, Afraid, Rape, and Kick questionnaire    Fear of Current or  Ex-Partner: No    Emotionally Abused: No    Physically Abused: No    Sexually Abused: No    FAMILY HISTORY: Family History  Problem Relation Age of Onset   Heart attack Mother    Prostate cancer Father    Breast cancer Sister    Prostate cancer Paternal Uncle     ALLERGIES:  has no known allergies.  MEDICATIONS:  Current Outpatient Medications  Medication Sig Dispense Refill   acetaminophen (TYLENOL) 650 MG CR tablet Take 1,300 mg by mouth every 8 (eight) hours as needed for pain.     chlorhexidine (PERIDEX) 0.12 % solution USE AS DIRECTED TAKE  15  ML  IN  THE  MOUTH  OR  THROAT  TWICE  DAILY 473 mL 0   gentamicin ointment (GARAMYCIN) 0.1 % Apply 1 Application topically 2 (two) times daily as needed.     levothyroxine (SYNTHROID) 125 MCG tablet Take 125 mcg by mouth every morning.     magic mouthwash (multi-ingredient) oral suspension Swish and spit 5-10 mLs by mouth 4 (four) times daily as needed. 480 mL 1   OLANZapine (ZYPREXA) 10 MG tablet Take 0.5-1 tablets (5-10 mg total) by mouth at bedtime as needed (nausea). 30 tablet 3   pantoprazole (PROTONIX) 40 MG tablet Take 1 tablet (40 mg total) by mouth 2 (two) times daily. 60 tablet 1   potassium chloride SA (KLOR-CON M) 20 MEQ tablet Take 2 tablets (40 mEq total) by mouth 2 (two) times daily. 60 tablet 1   predniSONE (DELTASONE) 20 MG tablet Take 2 tablets (40 mg total) by mouth daily with breakfast. 60 tablet 0   azelastine (ASTELIN) 0.1 % nasal spray Place 1 spray into both nostrils 2 (two) times daily. Use in each nostril as directed (Patient not taking: Reported on 07/14/2023) 30 mL 1   benzonatate (TESSALON) 200 MG capsule Take 1 capsule (200 mg total) by mouth 3 (three) times daily as needed for cough. (Patient not taking: Reported on 07/14/2023) 20 capsule 5   Cholecalciferol (VITAMIN D3) 125 MCG (5000 UT) CAPS Take 5,000 Units by mouth daily. (Patient not taking: Reported on 07/14/2023)     glycopyrrolate (ROBINUL) 1 MG  tablet Take 0.5 tablets (0.5 mg total) by mouth 2 (two) times daily as needed (excess secretions). (Patient not taking: Reported on 07/14/2023) 30 tablet 0   loperamide (IMODIUM) 2 MG capsule Take 2 tabs by mouth with first loose stool, then 1 tab with each additional loose stool as needed. Do not exceed 8 tabs in a 24-hour period (Patient not taking: Reported on 07/14/2023) 90 capsule 0   Multiple Vitamins-Minerals (MULTIVITAMIN WITH  MINERALS) tablet Take 1 tablet by mouth daily. (Patient not taking: Reported on 07/14/2023)     ondansetron (ZOFRAN) 8 MG tablet Take 1 tablet (8 mg total) by mouth every 8 (eight) hours as needed for nausea or vomiting. Start on the third day after chemotherapy. (Patient not taking: Reported on 07/14/2023) 90 tablet 1   No current facility-administered medications for this visit.    Review of Systems  Constitutional:  Positive for appetite change and fatigue. Negative for chills and fever.  HENT:   Negative for hearing loss and voice change.        Nasal congestion, postnasal drip.   Eyes:  Negative for eye problems and icterus.  Respiratory:  Negative for chest tightness, cough and shortness of breath.   Cardiovascular:  Negative for chest pain and leg swelling.  Gastrointestinal:  Positive for nausea. Negative for abdominal distention, abdominal pain and vomiting.  Endocrine: Negative for hot flashes.  Genitourinary:  Negative for difficulty urinating, dysuria and frequency.   Musculoskeletal:  Negative for arthralgias.  Skin:  Negative for itching and rash.  Neurological:  Positive for extremity weakness and numbness. Negative for light-headedness.  Hematological:  Negative for adenopathy. Does not bruise/bleed easily.  Psychiatric/Behavioral:  Negative for confusion.      PHYSICAL EXAMINATION: ECOG PERFORMANCE STATUS: 0 - Asymptomatic  Vitals:   07/14/23 1046  BP: 113/89  Pulse: (!) 122  Resp: 20  Temp: (!) 97.5 F (36.4 C)  SpO2: 98%   Filed  Weights   07/14/23 1046  Weight: 180 lb 8 oz (81.9 kg)    Physical Exam Constitutional:      General: He is not in acute distress.    Appearance: He is obese. He is not diaphoretic.  HENT:     Head: Normocephalic and atraumatic.  Eyes:     General: No scleral icterus. Cardiovascular:     Rate and Rhythm: Normal rate and regular rhythm.  Pulmonary:     Effort: Pulmonary effort is normal. No respiratory distress.     Breath sounds: No wheezing.  Abdominal:     General: There is no distension.     Palpations: Abdomen is soft.  Musculoskeletal:        General: Normal range of motion.     Cervical back: Normal range of motion and neck supple.  Skin:    General: Skin is warm and dry.     Findings: No erythema.  Neurological:     Mental Status: He is alert and oriented to person, place, and time. Mental status is at baseline.     Cranial Nerves: No cranial nerve deficit.     Motor: No abnormal muscle tone.  Psychiatric:        Mood and Affect: Mood and affect normal.      LABORATORY DATA:  I have reviewed the data as listed    Latest Ref Rng & Units 07/14/2023   10:12 AM 07/07/2023    8:26 AM 06/30/2023    8:45 AM  CBC  WBC 4.0 - 10.5 K/uL 4.7  5.3  11.6   Hemoglobin 13.0 - 17.0 g/dL 54.0  98.1  19.1   Hematocrit 39.0 - 52.0 % 39.6  40.5  39.7   Platelets 150 - 400 K/uL 236  197  256       Latest Ref Rng & Units 07/14/2023   10:12 AM 07/07/2023    8:26 AM 06/30/2023    8:45 AM  CMP  Glucose 70 - 99  mg/dL 951  884  166   BUN 8 - 23 mg/dL 15  18  18    Creatinine 0.61 - 1.24 mg/dL 0.63  0.16  0.10   Sodium 135 - 145 mmol/L 141  137  139   Potassium 3.5 - 5.1 mmol/L 3.4  3.1  3.3   Chloride 98 - 111 mmol/L 103  99  104   CO2 22 - 32 mmol/L 24  22  23    Calcium 8.9 - 10.3 mg/dL 9.7  9.4  9.1   Total Protein 6.5 - 8.1 g/dL 7.3  7.3  7.2   Total Bilirubin <1.2 mg/dL 0.9  1.0  0.5   Alkaline Phos 38 - 126 U/L 56  55  57   AST 15 - 41 U/L 24  29  25    ALT 0 - 44 U/L 21   26  27       RADIOGRAPHIC STUDIES: I have personally reviewed the radiological images as listed and agreed with the findings in the report. CT Angio Chest Pulmonary Embolism (PE) W or WO Contrast Result Date: 06/19/2023 CLINICAL DATA:  Esophageal cancer being treated with chemotherapy. Marked shortness of breath. Blood in urine. * Tracking Code: BO * EXAM: CT ANGIOGRAPHY CHEST CT ABDOMEN AND PELVIS WITH CONTRAST TECHNIQUE: Multidetector CT imaging of the chest was performed using the standard protocol during bolus administration of intravenous contrast. Multiplanar CT image reconstructions and MIPs were obtained to evaluate the vascular anatomy. Multidetector CT imaging of the abdomen and pelvis was performed using the standard protocol during bolus administration of intravenous contrast. RADIATION DOSE REDUCTION: This exam was performed according to the departmental dose-optimization program which includes automated exposure control, adjustment of the mA and/or kV according to patient size and/or use of iterative reconstruction technique. CONTRAST:  OMNIPAQUE IOHEXOL 350 MG/ML SOLN COMPARISON:  PET 05/21/2023, CT chest abdomen pelvis 05/02/2023. FINDINGS: CTA CHEST FINDINGS Cardiovascular: Right IJ Port-A-Cath terminates in the low SVC. Negative for pulmonary embolus. Atherosclerotic calcification of the aorta and left anterior descending coronary artery. Ascending aorta measures up to 4.0 cm (7/63). Heart is at the upper limits of normal in size to mildly enlarged. No pericardial effusion. Mediastinum/Nodes: No pathologically enlarged mediastinal, hilar or axillary lymph nodes. Similar lower esophageal wall thickening. Lungs/Pleura: Minimal patchy mid and lower lung zone predominant coarsened interstitial and subpleural ground-glass with bronchiectasis, similar to 05/02/2023. Suspect post radiation scarring in the medial right lower lobe. No pleural fluid. Airway is unremarkable. Musculoskeletal:  Degenerative changes in the spine. No worrisome lytic or sclerotic lesions. Review of the MIP images confirms the above findings. CT ABDOMEN and PELVIS FINDINGS Hepatobiliary: New and enlarging hepatic metastases are seen predominantly in the right lobe of the liver. New mass in the dome of the right hepatic lobe measures 2.5 x 2.8 cm (2/9). Stones in the gallbladder. No biliary ductal dilatation. Pancreas: Negative. Spleen: Negative. Adrenals/Urinary Tract: Heterogeneous right adrenal mass measures 4.1 x 4.6 cm, increased slightly from 3.4 x 4.3 cm. Left adrenal gland and right kidney are unremarkable. Low-attenuation lesions in the left kidney. No specific follow-up necessary. Small left renal stone. Ureters are decompressed. Bladder is low in volume. Stomach/Bowel: Small hiatal hernia. Small posterior gastric diverticulum. Stomach, small bowel, appendix and colon are otherwise unremarkable. Vascular/Lymphatic: Atherosclerotic calcification of the aorta. No pathologically enlarged lymph nodes. Reproductive: Prostate is visualized. Other: No free fluid.  Mesenteries and peritoneum are unremarkable. Musculoskeletal: Degenerative changes in the spine. Dextroconvex scoliosis. Review of the MIP images confirms  the above findings. IMPRESSION: 1. Negative for pulmonary embolus. 2. Persistent lower esophageal wall thickening with progressive hepatic and right adrenal metastatic disease, compatible with stage IV esophageal carcinoma, as on PET 05/21/2023. 3. 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 4. 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 5. 4.0 cm ascending aortic aneurysm. Recommend annual imaging followup by CTA or MRA. This recommendation follows 2010 ACCF/AHA/AATS/ACR/ASA/SCA/SCAI/SIR/STS/SVM Guidelines for the Diagnosis  and Management of Patients with Thoracic Aortic Disease. Circulation. 2010; 121: O130-Q657. Aortic aneurysm NOS (ICD10-I71.9). 6. Cholelithiasis. 7. Small left renal stone. 8. Aortic atherosclerosis (ICD10-I70.0). Left and descending coronary artery calcification. Electronically Signed   By: Leanna Battles M.D.   On: 06/19/2023 13:37   CT ABDOMEN PELVIS W CONTRAST Result Date: 06/19/2023 CLINICAL DATA:  Esophageal cancer being treated with chemotherapy. Marked shortness of breath. Blood in urine. * Tracking Code: BO * EXAM: CT ANGIOGRAPHY CHEST CT ABDOMEN AND PELVIS WITH CONTRAST TECHNIQUE: Multidetector CT imaging of the chest was performed using the standard protocol during bolus administration of intravenous contrast. Multiplanar CT image reconstructions and MIPs were obtained to evaluate the vascular anatomy. Multidetector CT imaging of the abdomen and pelvis was performed using the standard protocol during bolus administration of intravenous contrast. RADIATION DOSE REDUCTION: This exam was performed according to the departmental dose-optimization program which includes automated exposure control, adjustment of the mA and/or kV according to patient size and/or use of iterative reconstruction technique. CONTRAST:  OMNIPAQUE IOHEXOL 350 MG/ML SOLN COMPARISON:  PET 05/21/2023, CT chest abdomen pelvis 05/02/2023. FINDINGS: CTA CHEST FINDINGS Cardiovascular: Right IJ Port-A-Cath terminates in the low SVC. Negative for pulmonary embolus. Atherosclerotic calcification of the aorta and left anterior descending coronary artery. Ascending aorta measures up to 4.0 cm (7/63). Heart is at the upper limits of normal in size to mildly enlarged. No pericardial effusion. Mediastinum/Nodes: No pathologically enlarged mediastinal, hilar or axillary lymph nodes. Similar lower esophageal wall thickening. Lungs/Pleura: Minimal patchy mid and lower lung zone predominant coarsened interstitial and subpleural ground-glass  with bronchiectasis, similar to 05/02/2023. Suspect post radiation scarring in the medial right lower lobe. No pleural fluid. Airway is unremarkable. Musculoskeletal: Degenerative changes in the spine. No worrisome lytic or sclerotic lesions. Review of the MIP images confirms the above findings. CT ABDOMEN and PELVIS FINDINGS Hepatobiliary: New and enlarging hepatic metastases are seen predominantly in the right lobe of the liver. New mass in the dome of the right hepatic lobe measures 2.5 x 2.8 cm (2/9). Stones in the gallbladder. No biliary ductal dilatation. Pancreas: Negative. Spleen: Negative. Adrenals/Urinary Tract: Heterogeneous right adrenal mass measures 4.1 x 4.6 cm, increased slightly from 3.4 x 4.3 cm. Left adrenal gland and right kidney are unremarkable. Low-attenuation lesions in the left kidney. No specific follow-up necessary. Small left renal stone. Ureters are decompressed. Bladder is low in volume. Stomach/Bowel: Small hiatal hernia. Small posterior gastric diverticulum. Stomach, small bowel, appendix and colon are otherwise unremarkable. Vascular/Lymphatic: Atherosclerotic calcification of the aorta. No pathologically enlarged lymph nodes. Reproductive: Prostate is visualized. Other: No free fluid.  Mesenteries and peritoneum are unremarkable. Musculoskeletal: Degenerative changes in the spine. Dextroconvex scoliosis. Review of the MIP images confirms the above findings. IMPRESSION: 1. Negative for pulmonary embolus. 2. Persistent lower esophageal wall thickening with progressive hepatic and right adrenal metastatic disease, compatible with stage IV esophageal carcinoma, as on PET 05/21/2023. 3. Osseous metastatic disease, better seen  on PET 05/21/2023. 4. 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 5. 4.0 cm ascending aortic  aneurysm. Recommend annual imaging followup by CTA or MRA. This recommendation follows 2010 ACCF/AHA/AATS/ACR/ASA/SCA/SCAI/SIR/STS/SVM Guidelines for the Diagnosis and Management of Patients with Thoracic Aortic Disease. Circulation. 2010; 121: Z610-R604. Aortic aneurysm NOS (ICD10-I71.9). 6. Cholelithiasis. 7. Small left renal stone. 8. Aortic atherosclerosis (ICD10-I70.0). Left and descending coronary artery calcification. Electronically Signed   By: Leanna Battles M.D.   On: 06/19/2023 13:37

## 2023-07-14 NOTE — Progress Notes (Signed)
Pt here for follow up. Pt scheduled to see cardiology on 12/31.

## 2023-07-14 NOTE — Assessment & Plan Note (Signed)
Grade 2, he prefers to hold of neuropathy treatments.  observation.

## 2023-07-14 NOTE — Assessment & Plan Note (Signed)
 Likely due to poor oral intake, deconditoning, dehydration. He gets IVF hydration today.  Sinus Tachycardia. 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

## 2023-07-14 NOTE — Assessment & Plan Note (Addendum)
Refer to physical therapy He has spine lesions, will obtain MRI thoracic lumbar spine w w contrast

## 2023-07-14 NOTE — Assessment & Plan Note (Signed)
 Due to poor oral intake.  IVF 1L NS today,  Follow up with nutritionist

## 2023-07-14 NOTE — Assessment & Plan Note (Signed)
Chemotherapy plan as listed above 

## 2023-07-14 NOTE — Assessment & Plan Note (Signed)
Off Eliquis 2.5mg  BID due to GI bleeding

## 2023-07-14 NOTE — Progress Notes (Signed)
Patient completed 1L of NS fluid. Rechecked his HR and it was 95. Had the patient to stand and it went up to 110. Patient started to move and it continued to rise. He also stated that his legs begin to shake. RN wheeled patient down for safety concerns.

## 2023-07-14 NOTE — Assessment & Plan Note (Addendum)
Stage IV esophageal adenocarcinoma, liver metastatic disease, HER 2 negative. KRAS G12V, TMB 5.3, MSI Stable.  CPS 65%  1st line  FOLFOX , palliative RT --> 12/2022 CT progression--> switch to 5-FU + Nivolumab Q2 weeks  --> 02/2023 PET progression- pneumonitis--> 03/27/23  3rd line  Taxol and Cyramza --> 10/4 CT during admission showed slight decrease of liver lesion size. --> 10/30 PET obtained by radonc -liver lesion 10cm, bone mets. ? Progression.  Refer to [facility] for expert opinion. He declined.  Follow up with palliative care  GI bleeding due to esophageal cancer, s/p  palliative RT CT showed progressive disease. New liver lesion 2.5 cm, enlarging liver masses. Increased right adrenal mass. - disease progression.  Labs are reviewed and discussed with patient. S/p cycle 1  FOLFIRI with moderate difficulties Hold treatment due to tachycardia

## 2023-07-15 LAB — CEA: CEA: 110 ng/mL — ABNORMAL HIGH (ref 0.0–4.7)

## 2023-07-16 ENCOUNTER — Inpatient Hospital Stay: Payer: No Typology Code available for payment source

## 2023-07-17 ENCOUNTER — Inpatient Hospital Stay: Payer: No Typology Code available for payment source

## 2023-07-17 ENCOUNTER — Encounter: Payer: Self-pay | Admitting: Oncology

## 2023-07-17 DIAGNOSIS — Z008 Encounter for other general examination: Secondary | ICD-10-CM | POA: Diagnosis not present

## 2023-07-17 DIAGNOSIS — R630 Anorexia: Secondary | ICD-10-CM | POA: Diagnosis not present

## 2023-07-17 DIAGNOSIS — Z515 Encounter for palliative care: Secondary | ICD-10-CM | POA: Diagnosis not present

## 2023-07-17 NOTE — Progress Notes (Signed)
Nutrition Follow-up:  Patient with esophageal cancer.  Patient receiving folfiri, currently on hold.  Having tachycardia and planning on seeing cardiology  Spoke with patient via phone.  Reports that his appetite has improved a little after starting olanzapine.  Drank a protein shake this am for breakfast.  Yesterday able to drink a protein drink, eat soup and butterfinger.  Has had pizza recently and made a coke float.  Drinking carnation breakfast essentials shakes.      Medications: olanzapine  Labs: K 3.4, glucose 106  Anthropometrics:   Weight 180 lb 8 oz on 12/16 188 lb on 12/2 189 lb 9.6 oz on 11/21 198 lb on 11/7 217 lb on 9/27 219 lb on 9/6 220 lb on 8/14 236 lb on 5/13 265 lb on 1/31   NUTRITION DIAGNOSIS: Inadequate oral intake ongoing   INTERVENTION:  Continue carnation breakfast essentials. Knows he can add ice cream for additional calories and protein Maximize calories and protein in every bite.  Discussed ways to do this.  Continue olanzapine    MONITORING, EVALUATION, GOAL: weight trends, intake   NEXT VISIT: Thursday, Jan 9th phone call  Eryka Dolinger B. Freida Busman, RD, LDN Registered Dietitian [phone]

## 2023-07-17 NOTE — Addendum Note (Signed)
Addended by: Rickard Patience on: 07/17/2023 08:45 PM   Modules accepted: Orders

## 2023-07-18 ENCOUNTER — Inpatient Hospital Stay: Payer: No Typology Code available for payment source | Admitting: Oncology

## 2023-07-18 ENCOUNTER — Inpatient Hospital Stay: Payer: No Typology Code available for payment source

## 2023-07-21 ENCOUNTER — Other Ambulatory Visit: Payer: No Typology Code available for payment source

## 2023-07-21 ENCOUNTER — Ambulatory Visit
Admission: RE | Admit: 2023-07-21 | Discharge: 2023-07-21 | Disposition: A | Discharge status: Home / Self Care | Payer: No Typology Code available for payment source | Source: Ambulatory Visit | Attending: Oncology | Admitting: Oncology

## 2023-07-21 ENCOUNTER — Ambulatory Visit: Payer: No Typology Code available for payment source

## 2023-07-21 DIAGNOSIS — R29898 Other symptoms and signs involving the musculoskeletal system: Secondary | ICD-10-CM | POA: Diagnosis not present

## 2023-07-21 DIAGNOSIS — M899 Disorder of bone, unspecified: Secondary | ICD-10-CM

## 2023-07-21 DIAGNOSIS — C159 Malignant neoplasm of esophagus, unspecified: Secondary | ICD-10-CM | POA: Diagnosis not present

## 2023-07-21 DIAGNOSIS — M48061 Spinal stenosis, lumbar region without neurogenic claudication: Secondary | ICD-10-CM | POA: Diagnosis not present

## 2023-07-21 DIAGNOSIS — M5134 Other intervertebral disc degeneration, thoracic region: Secondary | ICD-10-CM | POA: Diagnosis not present

## 2023-07-21 DIAGNOSIS — M51379 Other intervertebral disc degeneration, lumbosacral region without mention of lumbar back pain or lower extremity pain: Secondary | ICD-10-CM | POA: Diagnosis not present

## 2023-07-21 MED ORDER — GADOBUTROL 1 MMOL/ML IV SOLN
7.5000 mL | Freq: Once | INTRAVENOUS | Status: AC | PRN
  Administered 2023-07-21: 7.5 mL via INTRAVENOUS

## 2023-07-25 ENCOUNTER — Ambulatory Visit: Payer: No Typology Code available for payment source

## 2023-07-25 VITALS — BP 118/88 | HR 102 | Ht 69.0 in | Wt 179.6 lb

## 2023-07-25 DIAGNOSIS — R000 Tachycardia, unspecified: Secondary | ICD-10-CM

## 2023-07-25 DIAGNOSIS — R0602 Shortness of breath: Secondary | ICD-10-CM

## 2023-07-25 MED ORDER — METOPROLOL TARTRATE 25 MG PO TABS: 25.0000 mg | ORAL_TABLET | Freq: Two times a day (BID) | ORAL | 3 refills | Status: DC

## 2023-07-25 NOTE — Assessment & Plan Note (Addendum)
Sinus tachycardia appears physiological response in the setting of deconditioning, anemia, poor oral intake.  Will obtain transthoracic echocardiogram to rule out any significant underlying cardiac structural and functional issues given ongoing chemotherapy and reported shortness of breath as below.  Will empirically start him on low-dose metoprolol tartrate 25 mg twice daily to avoid significant tachycardia response so as to allow him continuing with his chemotherapy sessions.

## 2023-07-25 NOTE — Assessment & Plan Note (Addendum)
Mild shortness of breath with exertion since recent onset of chemotherapy, weight loss, anemia, previously diagnosed pneumonitis..  Deconditioning and sinus tachycardia likely contributing to this.  Will rule out any significant underlying cardiac structural and functional issues with a transthoracic echocardiogram. He request this to be done at [facility] which is closer to his home.

## 2023-07-25 NOTE — Progress Notes (Signed)
Cardiology Consultation:    Date:  07/25/2023   ID:  Jonathan [name], DOB [birth year], MRN [id number]  PCP:  [name], MD  Cardiologist:  Marlyn Corporal Zedric Deroy, MD   Referring MD: Rickard Patience, MD   No chief complaint on file.    ASSESSMENT AND PLAN:   [name] 66 year old male with stage IV esophageal adenocarcinoma with mets to the liver undergoing palliative radiation therapy and chemotherapy, chronic anemia, hypothyroidism, weight loss, suspected DVT of Port-A-Cath site earlier this summer on anticoagulation with Eliquis briefly for 3 months [discontinued anticoagulation in November in the setting of upper GI bleed] here further evaluation of sinus tachycardia noted on physical exam and EKG and led to withholding of chemotherapy session recently. He does not have any active cardiac symptoms other than generalized tiredness and fatigue with exertion associated with mild shortness of breath.  Denies any chest pain or palpitations.  Problem List Items Addressed This Visit     Shortness of breath   Mild shortness of breath with exertion since recent onset of chemotherapy, weight loss, anemia, previously diagnosed pneumonitis..  Deconditioning and sinus tachycardia likely contributing to this.  Will rule out any significant underlying cardiac structural and functional issues with a transthoracic echocardiogram. He request this to be done at [facility] which is closer to his home.      Tachycardia - Primary (Chronic)   Sinus tachycardia appears physiological response in the setting of deconditioning, anemia, poor oral intake.  Will obtain transthoracic echocardiogram to rule out any significant underlying cardiac structural and functional issues given ongoing chemotherapy and reported shortness of breath as below.  Will empirically start him on low-dose metoprolol tartrate 25 mg twice daily to avoid significant tachycardia response so as to allow him  continuing with his chemotherapy sessions.      Relevant Orders   EKG 12-Lead (Completed)   ECHOCARDIOGRAM COMPLETE    Will review his echocardiogram results once available.  Further follow-up based on test results.   History of Present Illness:    Jonathan [name] is a 67 y.o. male who is being seen today for the evaluation of tachycardia at the request of Rickard Patience, MD.   Pleasant gentleman with history of stage IV esophageal adenocarcinoma with mets to the liver s/p palliative radiation therapy and undergoing chemotherapy, DVT of the portacath access briefly on Eliquis for 3 months( discontinue due to GI bleed in November/2024), weight loss, hypothyroidism [on levothyroxine for over 8 years] No prior history of CAD, CHF, CVA.  Pleasant gentleman here for the visit by himself.  Lives near [place] area.  Lives by himself at home.  His sister lives about a mile away.  He does not have children.  He retired after working at [employer] for over 20 years.  Referred by Dr Cathie Hoops for further evaluation of tachycardia, with elevated heart rates at baseline and further higher sinus tachycardia about 120 bpm, late to recent chemotherapy session to be suspended.  Today at visit he mentions he does not have any significant symptoms of chest pain, palpitations.  He in general feels tired since the onset of chemotherapy and feels slightly out of breath after exertion.  Denies any symptoms of shortness of breath at rest.  Denies any orthopnea, paroxysmal nocturnal dyspnea.  Denies any pedal edema.  Denies any blood in urine or stools recently.  Had brief episode of blood in urine few weeks ago that resolved.  Denies any dysphagia.  Mentions overall appetite has  reduced.  Has been losing weight over the past many months since the onset of chemotherapy.  EKG in the clinic today shows sinus tachycardia heart rate 102/min, PR interval normal 132 ms, QRS duration 96 ms, left axis consistent with left anterior  vascular block.  No significant change in comparison to Prior EKG from 07/03/2023 notes sinus tachycardia with heart rate 105/min, left axis with QRS 92 ms consistent with left anterior fascicular block.  Recent blood work from 07/14/2023 notes anemia with hemoglobin 12.3, hematocrit 39.6, appears chronic. WBC 4.7, platelets 236. CMP with sodium 141, potassium 3.4, BUN 15, creatinine 1.11, EGFR greater than 60 Magnesium 2.2  Last thyroid panel available to review is from 06/05/2023 TSH elevated 9.817,Total T48.3.  Total T4 normal 8.3  Past Medical History:  Diagnosis Date   Arthritis    Complication of anesthesia    OCCURRED ONCE YEARS AGO 1981   Deep venous thrombosis (HCC) 10/28/2022   Esophageal mass    Essential hypertension 05/01/2023   Hypertension    Hypothyroidism    PONV (postoperative nausea and vomiting)     Past Surgical History:  Procedure Laterality Date   COLONOSCOPY     ESOPHAGOGASTRODUODENOSCOPY N/A 08/23/2022   Procedure: ESOPHAGOGASTRODUODENOSCOPY (EGD);  Surgeon: Jaynie Collins, DO;  Location: [facility] ENDOSCOPY;  Service: Gastroenterology;  Laterality: N/A;   ESOPHAGOGASTRODUODENOSCOPY (EGD) WITH PROPOFOL N/A 05/05/2023   Procedure: ESOPHAGOGASTRODUODENOSCOPY (EGD) WITH PROPOFOL;  Surgeon: Jaynie Collins, DO;  Location: [facility] ENDOSCOPY;  Service: Gastroenterology;  Laterality: N/A;   EUS N/A 09/12/2022   Procedure: FULL UPPER ENDOSCOPIC ULTRASOUND (EUS) RADIAL;  Surgeon: Bearl Mulberry, MD;  Location: [facility] ENDOSCOPY;  Service: Gastroenterology;  Laterality: N/A;   FINGER SURGERY     HEMOSTASIS CONTROL  05/05/2023   Procedure: HEMOSTASIS CONTROL;  Surgeon: Jaynie Collins, DO;  Location: [facility] ENDOSCOPY;  Service: Gastroenterology;;   PORTA CATH INSERTION N/A 10/17/2022   Procedure: PORTA CATH INSERTION;  Surgeon: Annice Needy, MD;  Location: [facility] INVASIVE CV LAB;  Service: Cardiovascular;  Laterality: N/A;   TENDON REPAIR IN LEFT KNEE       Current Medications: Current Meds  Medication Sig   acetaminophen (TYLENOL) 650 MG CR tablet Take 1,300 mg by mouth every 8 (eight) hours as needed for pain.   azelastine (ASTELIN) 0.1 % nasal spray Place 1 spray into both nostrils 2 (two) times daily. Use in each nostril as directed   chlorhexidine (PERIDEX) 0.12 % solution USE AS DIRECTED TAKE  15  ML  IN  THE  MOUTH  OR  THROAT  TWICE  DAILY   gentamicin ointment (GARAMYCIN) 0.1 % Apply 1 Application topically 2 (two) times daily as needed.   levothyroxine (SYNTHROID) 125 MCG tablet Take 125 mcg by mouth every morning.   loperamide (IMODIUM) 2 MG capsule Take 2 tabs by mouth with first loose stool, then 1 tab with each additional loose stool as needed. Do not exceed 8 tabs in a 24-hour period   magic mouthwash (multi-ingredient) oral suspension Swish and spit 5-10 mLs by mouth 4 (four) times daily as needed.   metoprolol tartrate (LOPRESSOR) 25 MG tablet Take 1 tablet (25 mg total) by mouth 2 (two) times daily.   OLANZapine (ZYPREXA) 10 MG tablet Take 0.5-1 tablets (5-10 mg total) by mouth at bedtime as needed (nausea).   ondansetron (ZOFRAN) 8 MG tablet Take 1 tablet (8 mg total) by mouth every 8 (eight) hours as needed for nausea or vomiting. Start on the third day  after chemotherapy.   pantoprazole (PROTONIX) 40 MG tablet Take 1 tablet (40 mg total) by mouth 2 (two) times daily.   potassium chloride SA (KLOR-CON M) 20 MEQ tablet Take 2 tablets (40 mEq total) by mouth 2 (two) times daily.   predniSONE (DELTASONE) 20 MG tablet Take 2 tablets (40 mg total) by mouth daily with breakfast.   prochlorperazine (COMPAZINE) 10 MG tablet Take 10 mg by mouth every 6 (six) hours as needed.     Allergies:   Patient has no known allergies.   Social History   Socioeconomic History   Marital status: Single    Spouse name: Not on file   Number of children: Not on file   Years of education: Not on file   Highest education level: Not on file   Occupational History   Not on file  Tobacco Use   Smoking status: Never   Smokeless tobacco: Never  Vaping Use   Vaping status: Never Used  Substance and Sexual Activity   Alcohol use: Yes    Comment: OCCASIONALLY   Drug use: Never   Sexual activity: Not on file  Other Topics Concern   Not on file  Social History Narrative   Not on file   Social Drivers of Health   Financial Resource Strain: Low Risk  (10/29/2022)   [facility], [facility]   Overall Financial Resource Strain (CARDIA)    Difficulty of Paying Living Expenses: Not hard at all  Food Insecurity: No Food Insecurity (05/30/2023)   Hunger Vital Sign    Worried About Running Out of Food in the Last Year: Never true    Ran Out of Food in the Last Year: Never true  Transportation Needs: No Transportation Needs (05/30/2023)   PRAPARE - Administrator, Civil Service (Medical): No    Lack of Transportation (Non-Medical): No  Physical Activity: Not on file  Stress: Not on file  Social Connections: Not on file     Family History: The patient's family history includes Breast cancer in his sister; Heart attack in his mother; Prostate cancer in his father and paternal uncle. ROS:   Please see the history of present illness.    All 14 point review of systems negative except as described per history of present illness.  EKGs/Labs/Other Studies Reviewed:    The following studies were reviewed today:   EKG:  EKG Interpretation Date/Time:  Friday July 25 2023 09:32:52 EST Ventricular Rate:  102 PR Interval:  132 QRS Duration:  96 QT Interval:  352 QTC Calculation: 458 R Axis:   -59  Text Interpretation: Sinus tachycardia with Premature supraventricular complexes Left anterior fascicular block Abnormal ECG When compared with ECG of 30-Jun-2023 10:11, Premature supraventricular complexes are now Present Confirmed by Huntley Dec reddy [phone]) on  07/25/2023 8:32:38 AM    Recent Labs: 06/05/2023: TSH 9.817 07/07/2023: Magnesium 2.2 07/14/2023: ALT 21; BUN 15; Creatinine 1.11; Hemoglobin 12.3; Platelet Count 236; Potassium 3.4; Sodium 141  Recent Lipid Panel No results found for: "CHOL", "TRIG", "HDL", "CHOLHDL", "VLDL", "LDLCALC", "LDLDIRECT"  Physical Exam:    VS:  BP 118/88   Pulse (!) 102   Ht 5\' 9"  (1.753 m)   Wt 179 lb 9.6 oz (81.5 kg)   SpO2 98%   BMI 26.52 kg/m     Wt Readings from Last 3 Encounters:  07/25/23 179 lb 9.6 oz (81.5 kg)  07/14/23 180 lb 8 oz (81.9 kg)  07/07/23  179 lb 3.2 oz (81.3 kg)     GENERAL:  Well nourished, well developed in no acute distress NECK: No JVD; No carotid bruits Right infraclavicular Port-A-Cath present. CARDIAC: Regular rhythm, mildly tachycardic, S1 and S2 present, no murmurs, no rubs, no gallops CHEST:  Clear to auscultation without rales, wheezing or rhonchi  Extremities: No pitting pedal edema. Pulses bilaterally symmetric with radial 2+ and dorsalis pedis 2+ NEUROLOGIC:  Alert and oriented x 3  Medication Adjustments/Labs and Tests Ordered: Current medicines are reviewed at length with the patient today.  Concerns regarding medicines are outlined above.  Orders Placed This Encounter  Procedures   EKG 12-Lead   ECHOCARDIOGRAM COMPLETE   Meds ordered this encounter  Medications   metoprolol tartrate (LOPRESSOR) 25 MG tablet    Sig: Take 1 tablet (25 mg total) by mouth 2 (two) times daily.    Dispense:  180 tablet    Refill:  3    Signed, Arla Boutwell reddy Neasia Fleeman, MD, MPH, [facility]. 07/25/2023 9:16 AM    [facility]

## 2023-07-25 NOTE — Patient Instructions (Signed)
Medication Instructions:   Your physician has recommended you make the following change in your medication:  Start taking Metoprolol tartrate 25 mg twice a day.   *If you need a refill on your cardiac medications before your next appointment, please call your pharmacy*   Lab Work: None Ordered If you have labs (blood work) drawn today and your tests are completely normal, you will receive your results only by: MyChart Message (if you have MyChart) OR A paper copy in the mail If you have any lab test that is abnormal or we need to change your treatment, we will call you to review the results.   Testing/Procedures: Echocardiogram An echocardiogram is a test that uses sound waves (ultrasound) to produce images of the heart. Images from an echocardiogram can provide important information about: Heart size and shape. The size and thickness and movement of your heart's walls. Heart muscle function and strength. Heart valve function or if you have stenosis. Stenosis is when the heart valves are too narrow. If blood is flowing backward through the heart valves (regurgitation). A tumor or infectious growth around the heart valves. Areas of heart muscle that are not working well because of poor blood flow or injury from a heart attack. Aneurysm detection. An aneurysm is a weak or damaged part of an artery wall. The wall bulges out from the normal force of blood pumping through the body. Tell a health care provider about: Any allergies you have. All medicines you are taking, including vitamins, herbs, eye drops, creams, and over-the-counter medicines. Any blood disorders you have. Any surgeries you have had. Any medical conditions you have. Whether you are pregnant or may be pregnant. What are the risks? Generally, this is a safe test. However, problems may occur, including an allergic reaction to dye (contrast) that may be used during the test. What happens before the test? No specific  preparation is needed. You may eat and drink normally. What happens during the test?  You will take off your clothes from the waist up and put on a hospital gown. Electrodes or electrocardiogram (ECG)patches may be placed on your chest. The electrodes or patches are then connected to a device that monitors your heart rate and rhythm. You will lie down on a table for an ultrasound exam. A gel will be applied to your chest to help sound waves pass through your skin. A handheld device, called a transducer, will be pressed against your chest and moved over your heart. The transducer produces sound waves that travel to your heart and bounce back (or "echo" back) to the transducer. These sound waves will be captured in real-time and changed into images of your heart that can be viewed on a video monitor. The images will be recorded on a computer and reviewed by your health care provider. You may be asked to change positions or hold your breath for a short time. This makes it easier to get different views or better views of your heart. In some cases, you may receive contrast through an IV in one of your veins. This can improve the quality of the pictures from your heart. The procedure may vary among health care providers and hospitals. What can I expect after the test? You may return to your normal, everyday life, including diet, activities, and medicines, unless your health care provider tells you not to do that. Follow these instructions at home: It is up to you to get the results of your test. Ask your health care  provider, or the department that is doing the test, when your results will be ready. Keep all follow-up visits. This is important. Summary An echocardiogram is a test that uses sound waves (ultrasound) to produce images of the heart. Images from an echocardiogram can provide important information about the size and shape of your heart, heart muscle function, heart valve function, and other  possible heart problems. You do not need to do anything to prepare before this test. You may eat and drink normally. After the echocardiogram is completed, you may return to your normal, everyday life, unless your health care provider tells you not to do that. This information is not intended to replace advice given to you by your health care provider. Make sure you discuss any questions you have with your health care provider. Document Revised: 03/28/2021 Document Reviewed: 03/07/2020 Elsevier Patient Education  2023 [employer]       Follow-Up: At [facility], you and your health needs are our priority.  As part of our continuing mission to provide you with exceptional heart care, we have created designated Provider Care Teams.  These Care Teams include your primary Cardiologist (physician) and Advanced Practice Providers (APPs -  Physician Assistants and Nurse Practitioners) who all work together to provide you with the care you need, when you need it.  We recommend signing up for the patient portal called "MyChart".  Sign up information is provided on this After Visit Summary.  MyChart is used to connect with patients for Virtual Visits (Telemedicine).  Patients are able to view lab/test results, encounter notes, upcoming appointments, etc.  Non-urgent messages can be sent to your provider as well.   To learn more about what you can do with MyChart, go to [email or web address].    Your next appointment:   Follow up as needed

## 2023-07-28 ENCOUNTER — Ambulatory Visit: Payer: No Typology Code available for payment source

## 2023-07-28 ENCOUNTER — Ambulatory Visit: Payer: No Typology Code available for payment source | Admitting: Oncology

## 2023-07-28 ENCOUNTER — Other Ambulatory Visit: Payer: No Typology Code available for payment source

## 2023-07-29 ENCOUNTER — Ambulatory Visit: Payer: No Typology Code available for payment source | Admitting: Cardiology

## 2023-07-29 ENCOUNTER — Inpatient Hospital Stay: Payer: No Typology Code available for payment source

## 2023-07-29 ENCOUNTER — Encounter: Payer: Self-pay | Admitting: Oncology

## 2023-07-29 ENCOUNTER — Inpatient Hospital Stay (HOSPITAL_BASED_OUTPATIENT_CLINIC_OR_DEPARTMENT_OTHER): Payer: No Typology Code available for payment source | Admitting: Oncology

## 2023-07-29 VITALS — BP 140/94 | HR 80

## 2023-07-29 VITALS — BP 118/83 | HR 74 | Temp 97.9°F | Wt 178.9 lb

## 2023-07-29 DIAGNOSIS — E876 Hypokalemia: Secondary | ICD-10-CM

## 2023-07-29 DIAGNOSIS — G620 Drug-induced polyneuropathy: Secondary | ICD-10-CM

## 2023-07-29 DIAGNOSIS — T451X5A Adverse effect of antineoplastic and immunosuppressive drugs, initial encounter: Secondary | ICD-10-CM

## 2023-07-29 DIAGNOSIS — J984 Other disorders of lung: Secondary | ICD-10-CM

## 2023-07-29 DIAGNOSIS — R29898 Other symptoms and signs involving the musculoskeletal system: Secondary | ICD-10-CM | POA: Diagnosis not present

## 2023-07-29 DIAGNOSIS — Z5111 Encounter for antineoplastic chemotherapy: Secondary | ICD-10-CM | POA: Diagnosis not present

## 2023-07-29 DIAGNOSIS — C159 Malignant neoplasm of esophagus, unspecified: Secondary | ICD-10-CM

## 2023-07-29 DIAGNOSIS — R000 Tachycardia, unspecified: Secondary | ICD-10-CM | POA: Diagnosis not present

## 2023-07-29 DIAGNOSIS — I82621 Acute embolism and thrombosis of deep veins of right upper extremity: Secondary | ICD-10-CM | POA: Diagnosis not present

## 2023-07-29 DIAGNOSIS — R634 Abnormal weight loss: Secondary | ICD-10-CM | POA: Diagnosis not present

## 2023-07-29 LAB — CMP (CANCER CENTER ONLY)
ALT: 23 U/L (ref 0–44)
AST: 24 U/L (ref 15–41)
Albumin: 3.2 g/dL — ABNORMAL LOW (ref 3.5–5.0)
Alkaline Phosphatase: 74 U/L (ref 38–126)
Anion gap: 11 (ref 5–15)
BUN: 10 mg/dL (ref 8–23)
CO2: 24 mmol/L (ref 22–32)
Calcium: 9.1 mg/dL (ref 8.9–10.3)
Chloride: 103 mmol/L (ref 98–111)
Creatinine: 0.8 mg/dL (ref 0.61–1.24)
GFR, Estimated: >60 mL/min (ref >60)
Glucose, Bld: 115 mg/dL — ABNORMAL HIGH (ref 70–99)
Potassium: 3.4 mmol/L — ABNORMAL LOW (ref 3.5–5.1)
Sodium: 138 mmol/L (ref 135–145)
Total Bilirubin: 0.6 mg/dL (ref 0.0–1.2)
Total Protein: 6.8 g/dL (ref 6.5–8.1)

## 2023-07-29 LAB — CBC WITH DIFFERENTIAL (CANCER CENTER ONLY)
Abs Immature Granulocytes: 0.11 10*3/uL — ABNORMAL HIGH (ref 0.00–0.07)
Basophils Absolute: 0 10*3/uL (ref 0.0–0.1)
Basophils Relative: 0 %
Eosinophils Absolute: 0 10*3/uL (ref 0.0–0.5)
Eosinophils Relative: 0 %
HCT: 39.4 % (ref 39.0–52.0)
Hemoglobin: 12.2 g/dL — ABNORMAL LOW (ref 13.0–17.0)
Immature Granulocytes: 1 %
Lymphocytes Relative: 5 %
Lymphs Abs: 0.8 10*3/uL (ref 0.7–4.0)
MCH: 27.8 pg (ref 26.0–34.0)
MCHC: 31 g/dL (ref 30.0–36.0)
MCV: 89.7 fL (ref 80.0–100.0)
Monocytes Absolute: 0.8 10*3/uL (ref 0.1–1.0)
Monocytes Relative: 5 %
Neutro Abs: 13.4 10*3/uL — ABNORMAL HIGH (ref 1.7–7.7)
Neutrophils Relative %: 89 %
Platelet Count: 219 10*3/uL (ref 150–400)
RBC: 4.39 MIL/uL (ref 4.22–5.81)
RDW: 17.3 % — ABNORMAL HIGH (ref 11.5–15.5)
WBC Count: 15.1 10*3/uL — ABNORMAL HIGH (ref 4.0–10.5)
nRBC: 0 % (ref 0.0–0.2)

## 2023-07-29 MED ORDER — SODIUM CHLORIDE 0.9 % IV SOLN
400.0000 mg/m2 | Freq: Once | INTRAVENOUS | Status: AC
  Administered 2023-07-29: 796 mg via INTRAVENOUS
  Filled 2023-07-29: qty 39.8

## 2023-07-29 MED ORDER — SODIUM CHLORIDE 0.9 % IV SOLN
2400.0000 mg/m2 | INTRAVENOUS | Status: DC
  Administered 2023-07-29: 5000 mg via INTRAVENOUS
  Filled 2023-07-29: qty 100

## 2023-07-29 MED ORDER — ATROPINE SULFATE 1 MG/ML IV SOLN
0.5000 mg | Freq: Once | INTRAVENOUS | Status: AC
  Administered 2023-07-29: 0.5 mg via INTRAVENOUS
  Filled 2023-07-29: qty 1

## 2023-07-29 MED ORDER — DEXAMETHASONE SODIUM PHOSPHATE 10 MG/ML IJ SOLN
10.0000 mg | Freq: Once | INTRAMUSCULAR | Status: AC
  Administered 2023-07-29: 10 mg via INTRAVENOUS
  Filled 2023-07-29: qty 1

## 2023-07-29 MED ORDER — PANTOPRAZOLE SODIUM 40 MG PO TBEC: 40.0000 mg | DELAYED_RELEASE_TABLET | Freq: Two times a day (BID) | ORAL | 3 refills | Status: DC

## 2023-07-29 MED ORDER — PALONOSETRON HCL INJECTION 0.25 MG/5ML
0.2500 mg | Freq: Once | INTRAVENOUS | Status: AC
  Administered 2023-07-29: 0.25 mg via INTRAVENOUS
  Filled 2023-07-29: qty 5

## 2023-07-29 MED ORDER — SODIUM CHLORIDE 0.9 % IV SOLN
100.0000 mg/m2 | Freq: Once | INTRAVENOUS | Status: AC
  Administered 2023-07-29: 200 mg via INTRAVENOUS
  Filled 2023-07-29: qty 10

## 2023-07-29 MED ORDER — SODIUM CHLORIDE 0.9 % IV SOLN
INTRAVENOUS | Status: DC
Start: 2023-07-29 — End: 2023-07-29
  Filled 2023-07-29: qty 250

## 2023-07-29 NOTE — Patient Instructions (Signed)
 [facility] - A DEPT OF [facility]  Discharge Instructions: Thank you for choosing [facility] to provide your oncology and hematology care.  If you have a lab appointment with the Cancer Center, please go directly to the Cancer Center and check in at the registration area.  Wear comfortable clothing and clothing appropriate for easy access to any Portacath or PICC line.   We strive to give you quality time with your provider. You may need to reschedule your appointment if you arrive late (15 or more minutes).  Arriving late affects you and other patients whose appointments are after yours.  Also, if you miss three or more appointments without notifying the office, you may be dismissed from the clinic at the provider's discretion.      For prescription refill requests, have your pharmacy contact our office and allow 72 hours for refills to be completed.    Today you received the following chemotherapy and/or immunotherapy agents Irinotecan , Leucovorin  and Adrucil        To help prevent nausea and vomiting after your treatment, we encourage you to take your nausea medication as directed.  BELOW ARE SYMPTOMS THAT SHOULD BE REPORTED IMMEDIATELY: *FEVER GREATER THAN 100.4 F (38 C) OR HIGHER *CHILLS OR SWEATING *NAUSEA AND VOMITING THAT IS NOT CONTROLLED WITH YOUR NAUSEA MEDICATION *UNUSUAL SHORTNESS OF BREATH *UNUSUAL BRUISING OR BLEEDING *URINARY PROBLEMS (pain or burning when urinating, or frequent urination) *BOWEL PROBLEMS (unusual diarrhea, constipation, pain near the anus) TENDERNESS IN MOUTH AND THROAT WITH OR WITHOUT PRESENCE OF ULCERS (sore throat, sores in mouth, or a toothache) UNUSUAL RASH, SWELLING OR PAIN  UNUSUAL VAGINAL DISCHARGE OR ITCHING   Items with * indicate a potential emergency and should be followed up as soon as possible or go to the Emergency Department if any problems should occur.  Please show the CHEMOTHERAPY  ALERT CARD or IMMUNOTHERAPY ALERT CARD at check-in to the Emergency Department and triage nurse.  Should you have questions after your visit or need to cancel or reschedule your appointment, please contact [facility] - A DEPT OF [facility]  [phone] and follow the prompts.  Office hours are 8:00 a.m. to 4:30 p.m. Monday - Friday. Please note that voicemails left after 4:00 p.m. may not be returned until the following business day.  We are closed weekends and major holidays. You have access to a nurse at all times for urgent questions. Please call the main number to the clinic [phone] and follow the prompts.  For any non-urgent questions, you may also contact your provider using MyChart. We now offer e-Visits for anyone 26 and older to request care online for non-urgent symptoms. For details visit [email or web address].   Also download the MyChart app! Go to the app store, search MyChart, open the app, select [facility], and log in with your MyChart username and password.

## 2023-07-29 NOTE — Assessment & Plan Note (Signed)
Follow up with nutritionist.  

## 2023-07-29 NOTE — Assessment & Plan Note (Signed)
Grade 2, he prefers to hold of neuropathy treatments.  observation.

## 2023-07-29 NOTE — Progress Notes (Addendum)
 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 KRAS G12V, TMB 5.3, MSI Stable.  CPS 65%  1st line  FOLFOX , palliative RT --> 12/2022 CT progression--> switch to 5-FU + Nivolumab  Q2 weeks  --> 02/2023 PET progression- pneumonitis--> 03/27/23  3rd line  Taxol  and Cyramza  --> 10/4 CT during admission showed slight decrease of liver lesion size. --> 10/30 PET obtained by radonc -liver lesion 10cm, bone mets. ? Progression.  Refer to [facility] for expert opinion. He declined.  Follow up with palliative care  GI bleeding due to esophageal cancer, s/p  palliative RT CT showed progressive disease. New liver lesion 2.5 cm, enlarging liver masses. Increased right adrenal mass. - disease progression.  Labs are reviewed and discussed with patient. Proceed with cycle 2  FOLFIRI -irinotecan  dose reduction to 100mg /m2    Weakness of both legs Refer to physical therapy MRI thoracic lumbar spine w w contrast showed no cord compression.   Chemotherapy-induced neuropathy (HCC) Grade 2, he prefers to hold of neuropathy treatments.  observation.   Deep venous thrombosis (HCC) Off Eliquis  2.5mg  BID due to GI bleeding  Encounter for antineoplastic chemotherapy Chemotherapy plan as listed above.   Hypokalemia Chronic hypokalemia, normal magnesia level.  Cotinue KCL to 40meq BID. 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  SOB cough symptoms are improved.  continue prednisone  40mg  daily, recommend patient to taper down to 30mg  daily  next week    Tachycardia Likely due to poor oral intake, deconditoning, dehydration. Seen by cardiology and started on metoprolol   Tachycardia resolved.   Weight loss Follow up with nutritionist    Orders Placed This Encounter  Procedures   CBC with Differential (Cancer Center Only)    Standing Status:   Future    Expected Date:   08/05/2023    Expiration Date:   07/28/2024   CMP (Cancer Center only)    Standing Status:   Future    Expected Date:   08/05/2023    Expiration Date:   07/28/2024    Follow-up 2 weeks  All questions were answered. The patient knows to call the clinic with any problems, questions or concerns.  Call Babara, MD, PhD [facility] Hematology Oncology 07/29/2023   HISTORY OF PRESENTING ILLNESS:  Jonathan [name] 67 y.o. male presents to establish care for esophageal adenocarcinoma I have reviewed his chart and materials related to his cancer extensively and collaborated history with the patient. Summary of oncologic history is as follows: Oncology History  Adenocarcinoma of esophagus (HCC)  08/28/2022 Initial Diagnosis   Adenocarcinoma of esophagus   -Patient has noticed worsening of food stuck/fullness sensation since November 2023.  Patient had a barium swallow study which commented on marked mucosal irregularity in the distal esophagus with Broaddus base mural filling defect highly suspicious for malignancy.  Patient establish care with gastroenterology. -08/23/2022, EGD showed gastritis and partially obstructing malignant esophageal tumor in the lower third of the esophagus. Esophagus mass biopsy showed adenocarcinoma.  PD-L1 TPS 65%, HER2 negative.  [facility] NGS showed KRASG12V, CDKN2A, ARID1A, TP53, TMB 5.3, MSI stable.  Stomach biopsy showed gastric mucosa with no specific histology abnormality.  No significant intestinal metaplastic, dysplastic, granular atrophy or increased inflammation.     08/28/2022 Imaging   CT chest abdomen pelvis with  contrast showed 1. Distal esophageal primary with gastrohepatic ligament nodal metastasis. 2. 2 right-sided pulmonary nodules, the largest of which measures 5 mm and is new since 2015. Pulmonary metastasis not be excluded. 3. Anterior right lower lobe volume loss and minimal soft tissue density, favoring atelectasis or scar. Recommend attention on follow-up. 4. Hepatic steatosis 5. Cholelithiasis 6. Left nephrolithiasis 7. Coronary artery atherosclerosis. Aortic Atherosclerosis   08/28/2022 Cancer Staging   Staging form: Esophagus - Adenocarcinoma, AJCC 8th Edition - Clinical stage from 08/28/2022: Stage IVB (cT3, cN1, cM1) - Signed by Babara Call, MD on 10/04/2022 Stage prefix: Initial diagnosis   09/06/2022 Imaging   PET scan showed 1. Esophageal primary with gastrohepatic ligament nodal metastasis,as on CT. 2. Focus of hypermetabolism which is favored to registered to the posterior hepatic dome, in the region of subtle heterogeneity on prior diagnostic CT. Suboptimally evaluated secondary to underlying steatosis. Recommend further evaluation with pre and post contrast abdominal MRI to confirm probable metastasis. 3. Incidental findings, including: Left nephrolithiasis.Cholelithiasis. Coronary artery atherosclerosis. Aortic Atherosclerosis    09/23/2022 Imaging   MRI abdomen with and without contrast showed 1. Mildly T2 hyperintense segment VII hepatic lesion measuring 2.7 cm with imaging characteristics compatible with metastatic disease. 2. Tiny focus of delayed enhancement in the inferior right lobe of the liver segment VI measuring 8 mm with ill-defined increased T2 signal and subtle corresponding reduced diffusivity, also suspicious for metastatic disease. 3. Partially visualized distal esophageal wall thickening compatible with the patient's known primary neoplasm. 4. Similar size of the 11 mm gastrohepatic ligament lymph node mildly metabolic on prior PET-CT and compatible with local  nodal disease involvement. 5. 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 Recommend follow up pre and post-contrast MRI/MRCP in 1 year. 6. Diffuse hepatic steatosis.   10/10/2022 Procedure   LIVER MASS; CT-GUIDED BIOPSY:  - MODERATE TO POORLY DIFFERENTIATED ADENOCARCINOMA MORPHOLOGICALLY  CONSISTENT WITH METASTASIS FROM PATIENT'S KNOWN ESOPHAGEAL  ADENOCARCINOMA.    10/17/2022 Procedure   Medi port placed by Dr. Marea   10/21/2022 - 01/15/2023 Chemotherapy   Patient is on Treatment Plan : ESOPHAGEAL ADENOCARCINOMA FOLFOX q14d x 6 cycles     01/15/2023 Imaging   PET showed  1. Interval progression in metastatic esophageal carcinoma as evidenced by hypermetabolic lymph nodes in the neck, chest, abdomen and pelvis, an enlarging right hepatic lobe metastasis, new bilateral adrenal metastases and new osseous metastases. 2. Cholelithiasis. 3. Left renal stones. 4. Aortic atherosclerosis (ICD10-I70.0). Coronary artery calcification.    01/27/2023 - 03/14/2023 Chemotherapy   Patient is on Treatment Plan : GASTROESOPHAGEAL FOLFOX + Nivolumab  q14d     03/20/2023 Imaging   CT chest abdomen pelvis w contrast showed 1. Interval progression of metastatic disease with multiple new and enlarged hypodense lesions throughout the liver. 2. Significant enlargement of bilateral adrenal metastases. 3. Subtle sclerosis of an osseous metastasis of the right femoral neck. Other previously FDG avid osseous metastases of the left femoral neck and right sixth rib not appreciated by CT. 4. No persistently enlarged lymph nodes. 5. Interval development of bandlike consolidation and fibrosis of the paramedian right lung, particularly of the right lower lobe, as well as additional scattered irregular opacities throughout the right lung. Findings are  most consistent with development of radiation pneumonitis and fibrosis. 6. Similar  circumferential wall thickening throughout the mid to lower esophagus, consistent with known primary esophageal adenocarcinoma. 7. Nonobstructive bilateral nephrolithiasis. Aortic Atherosclerosis   03/27/2023 - 06/19/2023 Chemotherapy   Patient is on Treatment Plan : GASTROESOPHAGEAL Ramucirumab  D1, 15 + Paclitaxel  D1,8,15 q28d     05/02/2023 - 05/08/2023 Hospital Admission   Admission due to coffee-ground emesis. EGD 10/7 showed bleeding from esophageal cancer, esophagitis due to chemo and radiation. Applied Hemospray. Eliquis  was stopped. Acute blood loss anemia, received IV venofer  treatments x 3 doses    05/28/2023 Imaging   1. Progressive hypermetabolism in the distal esophagus consistent with progressive disease. 2. Significant progression of metastatic hepatic disease (new and enlarging lesions). 3. Enlarging and progressively hypermetabolic right adrenal gland mass. 4. New hypermetabolic bone lesions in the T12 and L2 vertebral bodies, left sacrum and right sixth posterior rib consistent with metastatic disease. 5. Possible hypermetabolic brain lesions. MRI suggested for further evaluation. 6. Chronic medial right lower lobe atelectasis likely radiation change. Moderate hypermetabolism but no obvious tumor. 7. Stable gallstones, renal calculi and vascular calcifications.   06/19/2023 Imaging   CT chest angiogram w contrast, CT abdomen pelvis w contrast  1. Negative for pulmonary embolus. 2. Persistent lower esophageal wall thickening with progressive hepatic and right adrenal metastatic disease, compatible with stage IV esophageal carcinoma, as on PET 05/21/2023. 3. 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  4. 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 5. 4.0 cm ascending aortic aneurysm.  Recommend annual imaging follow up by CTA or MRA. This recommendation follows 2010 ACCF/AHA/AATS/ACR/ASA/SCA/SCAI/SIR/STS/SVM Guidelines for the Diagnosis and Management of Patients with Thoracic Aortic Disease.Circulation. 2010; 121: [id number]. Aortic aneurysm NOS (ICD10-I71.9). 6. Cholelithiasis. 7. Small left renal stone. 8. Aortic atherosclerosis (ICD10-I70.0). Left and descending coronary artery calcification.   06/30/2023 -  Chemotherapy   Patient is on Treatment Plan : COLORECTAL FOLFIRI q14d     Patient presents to establish care.  He is not taking PPI. He has intentionally lost some weight. Family history positive for father and paternal uncle with prostate cancer and sister with breast cancer. Denies any routine alcohol use  Right interval jugular vein occlusive DVT, started on Eliquis  starter package on 10/28/2022, swelling of neck has improved.   CXR showed Interval development of bilateral perihilar interstitial opacities  He was treated with Azithromycin  and Doxycycline  to cover atypical infections.   INTERVAL HISTORY EZEKIAH MASSIE is a 67 y.o. male who has above history reviewed by me today presents for follow up visit for Stage IV esophageal adenocarcinoma.  No additionally hematemesis episode.  He continues to have nasal congestion, postnatal drip, mucus production which causes nausea.  Appetite is slightly better after started on olanzapine .   currently on Prednisone  40mg  daily. Chronic SOB/ cough. He takes PPI.   + neuropathy of finger tips, toes/bottom of feet. Rubbery + weakness of bilateral lower extremities.       MEDICAL HISTORY:  Past Medical History:  Diagnosis Date   Arthritis    Complication of anesthesia    OCCURRED ONCE YEARS AGO 1981   Deep venous thrombosis (HCC) 10/28/2022   Esophageal mass    Essential hypertension 05/01/2023   Hypertension    Hypothyroidism    PONV (postoperative nausea and vomiting)     SURGICAL HISTORY: Past  Surgical History:  Procedure  Laterality Date   COLONOSCOPY     ESOPHAGOGASTRODUODENOSCOPY N/A 08/23/2022   Procedure: ESOPHAGOGASTRODUODENOSCOPY (EGD);  Surgeon: Onita Elspeth Sharper, DO;  Location: [facility] ENDOSCOPY;  Service: Gastroenterology;  Laterality: N/A;   ESOPHAGOGASTRODUODENOSCOPY (EGD) WITH PROPOFOL  N/A 05/05/2023   Procedure: ESOPHAGOGASTRODUODENOSCOPY (EGD) WITH PROPOFOL ;  Surgeon: Onita Elspeth Sharper, DO;  Location[facility] ENDOSCOPY;  Service: Gastroenterology;  Laterality: N/A;   EUS N/A 09/12/2022   Procedure: FULL UPPER ENDOSCOPIC ULTRASOUND (EUS) RADIAL;  Surgeon: Queenie Asberry LABOR, MD;  Location: [facility] ENDOSCOPY;  Service: Gastroenterology;  Laterality: N/A;   FINGER SURGERY     HEMOSTASIS CONTROL  05/05/2023   Procedure: HEMOSTASIS CONTROL;  Surgeon: Onita Elspeth Sharper, DO;  Location: [facility] ENDOSCOPY;  Service: Gastroenterology;;   PORTA CATH INSERTION N/A 10/17/2022   Procedure: PORTA CATH INSERTION;  Surgeon: Marea Selinda RAMAN, MD;  Location: [facility] INVASIVE CV LAB;  Service: Cardiovascular;  Laterality: N/A;   TENDON REPAIR IN LEFT KNEE      SOCIAL HISTORY: Social History   Socioeconomic History   Marital status: Single    Spouse name: Not on file   Number of children: Not on file   Years of education: Not on file   Highest education level: Not on file  Occupational History   Not on file  Tobacco Use   Smoking status: Never   Smokeless tobacco: Never  Vaping Use   Vaping status: Never Used  Substance and Sexual Activity   Alcohol use: Yes    Comment: OCCASIONALLY   Drug use: Never   Sexual activity: Not on file  Other Topics Concern   Not on file  Social History Narrative   Not on file   Social Drivers of Health   Financial Resource Strain: Low Risk  (10/29/2022)   [facility]   Overall Financial Resource Strain (CARDIA)    Difficulty of Paying Living Expenses: Not hard at all  Food Insecurity:  No Food Insecurity (05/30/2023)   Hunger Vital Sign    Worried About Running Out of Food in the Last Year: Never true    Ran Out of Food in the Last Year: Never true  Transportation Needs: No Transportation Needs (05/30/2023)   PRAPARE - Administrator, Civil Service (Medical): No    Lack of Transportation (Non-Medical): No  Physical Activity: Not on file  Stress: Not on file  Social Connections: Not on file  Intimate Partner Violence: Not At Risk (05/30/2023)   Humiliation, Afraid, Rape, and Kick questionnaire    Fear of Current or Ex-Partner: No    Emotionally Abused: No    Physically Abused: No    Sexually Abused: No    FAMILY HISTORY: Family History  Problem Relation Age of Onset   Heart attack Mother    Prostate cancer Father    Breast cancer Sister    Prostate cancer Paternal Uncle     ALLERGIES:  has no known allergies.  MEDICATIONS:  Current Outpatient Medications  Medication Sig Dispense Refill   acetaminophen  (TYLENOL ) 650 MG CR tablet Take 1,300 mg by mouth every 8 (eight) hours as needed for pain.     azelastine  (ASTELIN ) 0.1 % nasal spray Place 1 spray into both nostrils 2 (two) times daily. Use in each nostril as directed 30 mL 1   chlorhexidine  (PERIDEX ) 0.12 % solution USE AS DIRECTED TAKE  15  ML  IN  THE  MOUTH  OR  THROAT  TWICE  DAILY 473  mL 0   gentamicin  ointment (GARAMYCIN ) 0.1 % Apply 1 Application topically 2 (two) times daily as needed.     levothyroxine  (SYNTHROID ) 125 MCG tablet Take 125 mcg by mouth every morning.     loperamide  (IMODIUM ) 2 MG capsule Take 2 tabs by mouth with first loose stool, then 1 tab with each additional loose stool as needed. Do not exceed 8 tabs in a 24-hour period 90 capsule 0   magic mouthwash (multi-ingredient) oral suspension Swish and spit 5-10 mLs by mouth 4 (four) times daily as needed. 480 mL 1   metoprolol  tartrate (LOPRESSOR ) 25 MG tablet Take 1 tablet (25 mg total) by mouth 2 (two) times daily. 180 tablet  3   OLANZapine  (ZYPREXA ) 10 MG tablet Take 0.5-1 tablets (5-10 mg total) by mouth at bedtime as needed (nausea). 30 tablet 3   ondansetron  (ZOFRAN ) 8 MG tablet Take 1 tablet (8 mg total) by mouth every 8 (eight) hours as needed for nausea or vomiting. Start on the third day after chemotherapy. 90 tablet 1   potassium chloride  SA (KLOR-CON  M) 20 MEQ tablet Take 2 tablets (40 mEq total) by mouth 2 (two) times daily. 60 tablet 1   predniSONE  (DELTASONE ) 20 MG tablet Take 2 tablets (40 mg total) by mouth daily with breakfast. 60 tablet 0   prochlorperazine  (COMPAZINE ) 10 MG tablet Take 10 mg by mouth every 6 (six) hours as needed.     pantoprazole  (PROTONIX ) 40 MG tablet Take 1 tablet (40 mg total) by mouth 2 (two) times daily. 60 tablet 3   No current facility-administered medications for this visit.   Facility-Administered Medications Ordered in Other Visits  Medication Dose Route Frequency Provider Last Rate Last Admin   0.9 %  sodium chloride  infusion   Intravenous Continuous Babara Call, MD   Stopped at 07/29/23 1203   fluorouracil  (ADRUCIL ) 5,000 mg in sodium chloride  0.9 % 150 mL chemo infusion  2,400 mg/m2 (Order-Specific) Intravenous 1 day or 1 dose Babara Call, MD   Infusion Verify at 07/29/23 1214    Review of Systems  Constitutional:  Positive for appetite change and fatigue. Negative for chills and fever.  HENT:   Negative for hearing loss and voice change.        Nasal congestion, postnasal drip.   Eyes:  Negative for eye problems and icterus.  Respiratory:  Negative for chest tightness, cough and shortness of breath.   Cardiovascular:  Negative for chest pain and leg swelling.  Gastrointestinal:  Positive for nausea. Negative for abdominal distention, abdominal pain and vomiting.  Endocrine: Negative for hot flashes.  Genitourinary:  Negative for difficulty urinating, dysuria and frequency.   Musculoskeletal:  Negative for arthralgias.  Skin:  Negative for itching and rash.   Neurological:  Positive for extremity weakness and numbness. Negative for light-headedness.  Hematological:  Negative for adenopathy. Does not bruise/bleed easily.  Psychiatric/Behavioral:  Negative for confusion.      PHYSICAL EXAMINATION: ECOG PERFORMANCE STATUS: 0 - Asymptomatic  Vitals:   07/29/23 0841  BP: 118/83  Pulse: 74  Temp: 97.9 F (36.6 C)  SpO2: 98%   Filed Weights   07/29/23 0841  Weight: 178 lb 14.4 oz (81.1 kg)    Physical Exam Constitutional:      General: He is not in acute distress.    Appearance: He is obese. He is not diaphoretic.  HENT:     Head: Normocephalic and atraumatic.  Eyes:     General: No scleral icterus. Cardiovascular:  Rate and Rhythm: Normal rate and regular rhythm.  Pulmonary:     Effort: Pulmonary effort is normal. No respiratory distress.     Breath sounds: No wheezing.  Abdominal:     General: There is no distension.     Palpations: Abdomen is soft.  Musculoskeletal:        General: Normal range of motion.     Cervical back: Normal range of motion and neck supple.  Skin:    General: Skin is warm and dry.     Findings: No erythema.  Neurological:     Mental Status: He is alert and oriented to person, place, and time. Mental status is at baseline.     Cranial Nerves: No cranial nerve deficit.     Motor: No abnormal muscle tone.  Psychiatric:        Mood and Affect: Mood and affect normal.      LABORATORY DATA:  I have reviewed the data as listed    Latest Ref Rng & Units 07/29/2023    7:58 AM 07/14/2023   10:12 AM 07/07/2023    8:26 AM  CBC  WBC 4.0 - 10.5 K/uL 15.1  4.7  5.3   Hemoglobin 13.0 - 17.0 g/dL 87.7  87.6  87.0   Hematocrit 39.0 - 52.0 % 39.4  39.6  40.5   Platelets 150 - 400 K/uL 219  236  197       Latest Ref Rng & Units 07/29/2023    7:58 AM 07/14/2023   10:12 AM 07/07/2023    8:26 AM  CMP  Glucose 70 - 99 mg/dL 884  893  885   BUN 8 - 23 mg/dL 10  15  18    Creatinine 0.61 - 1.24 mg/dL  9.19  8.88  8.90   Sodium 135 - 145 mmol/L 138  141  137   Potassium 3.5 - 5.1 mmol/L 3.4  3.4  3.1   Chloride 98 - 111 mmol/L 103  103  99   CO2 22 - 32 mmol/L 24  24  22    Calcium  8.9 - 10.3 mg/dL 9.1  9.7  9.4   Total Protein 6.5 - 8.1 g/dL 6.8  7.3  7.3   Total Bilirubin 0.0 - 1.2 mg/dL 0.6  0.9  1.0   Alkaline Phos 38 - 126 U/L 74  56  55   AST 15 - 41 U/L 24  24  29    ALT 0 - 44 U/L 23  21  26       RADIOGRAPHIC STUDIES: I have personally reviewed the radiological images as listed and agreed with the findings in the report. MR Lumbar Spine W Wo Contrast Result Date: 07/29/2023 CLINICAL DATA:  Esophageal cancer being treated with chemotherapy. New weakness in both legs. EXAM: MRI THORACIC AND LUMBAR SPINE WITHOUT AND WITH CONTRAST TECHNIQUE: Multiplanar and multiecho pulse sequences of the thoracic and lumbar spine were obtained without and with intravenous contrast. CONTRAST:  7.5mL GADAVIST  GADOBUTROL  1 MMOL/ML IV SOLN COMPARISON:  Head CT 05/21/2023 and body CT 06/19/2023 FINDINGS: MRI THORACIC SPINE FINDINGS Alignment:  Mild degenerative anterolisthesis at T2-3. Vertebrae: Accentuated fatty marrow at T4-T10, likely related to esophageal radiation. Small lesion in the T12 body, compatible with metastatic disease when compared to prior PET-CT. No underlying fracture. Small lesions in the T9 and T10 vertebral body could be additional metastatic deposits. No extraosseous tumor extension. Cord:  Normal signal and morphology. Paraspinal and other soft tissues: Known liver and right adrenal masses.  Disc levels: Generalized disc narrowing and desiccation with endplate and facet spurring. MRI LUMBAR SPINE FINDINGS Segmentation:  Standard. Alignment:  Slight L4-5 anterolisthesis.  Dextroscoliosis. Vertebrae: Infiltrative bone marrow signal abnormality at L2, correlating with PET CT. No extraosseous tumor or insufficiency fracture. No incidental bone infection. Conus medullaris: Extends to the T12-L1  level and appears normal. Normal appearance of the cauda equina . Paraspinal and other soft tissues: Known from visceral imaging. Disc levels: T12- L1: Spondylosis.  No neural impingement L1-L2: Disc height loss and spondylosis.  No neural impingement L2-L3: Disc narrowing and bulging with biforaminal protrusion. Mild facet spurring. L3-L4: Disc narrowing and circumferential bulging with central protrusion. Degenerative facet spurring on both sides. Mild triangular narrowing of the thecal sac. L4-L5: Degenerative facet spurring which is bulky. Mild anterolisthesis. The disc is narrowed and bulging with moderate left subarticular recess stenosis. The foramina are patent L5-S1:Disc narrowing and bulging with right inferior foraminal protrusion. Degenerative facet spurring on both sides. Mild right subarticular recess and foraminal narrowing. IMPRESSION: Thoracic spine: Limited metastatic disease seen at the T9, T10, and T12 bodies. No extraosseous tumor or cord signal abnormality to correlate with weakness. Lumbar spine: 1. L2 vertebral body metastasis, known from prior PET-CT. No extra-osseous extension, fracture, or other explanation for weakness. 2. Generalized lumbar spine degeneration as described. Diffusely patent spinal canal. Electronically Signed   By: Dorn Roulette M.D.   On: 07/29/2023 08:57   MR Thoracic Spine W Wo Contrast Result Date: 07/29/2023 CLINICAL DATA:  Esophageal cancer being treated with chemotherapy. New weakness in both legs. EXAM: MRI THORACIC AND LUMBAR SPINE WITHOUT AND WITH CONTRAST TECHNIQUE: Multiplanar and multiecho pulse sequences of the thoracic and lumbar spine were obtained without and with intravenous contrast. CONTRAST:  7.5mL GADAVIST  GADOBUTROL  1 MMOL/ML IV SOLN COMPARISON:  Head CT 05/21/2023 and body CT 06/19/2023 FINDINGS: MRI THORACIC SPINE FINDINGS Alignment:  Mild degenerative anterolisthesis at T2-3. Vertebrae: Accentuated fatty marrow at T4-T10, likely related to  esophageal radiation. Small lesion in the T12 body, compatible with metastatic disease when compared to prior PET-CT. No underlying fracture. Small lesions in the T9 and T10 vertebral body could be additional metastatic deposits. No extraosseous tumor extension. Cord:  Normal signal and morphology. Paraspinal and other soft tissues: Known liver and right adrenal masses. Disc levels: Generalized disc narrowing and desiccation with endplate and facet spurring. MRI LUMBAR SPINE FINDINGS Segmentation:  Standard. Alignment:  Slight L4-5 anterolisthesis.  Dextroscoliosis. Vertebrae: Infiltrative bone marrow signal abnormality at L2, correlating with PET CT. No extraosseous tumor or insufficiency fracture. No incidental bone infection. Conus medullaris: Extends to the T12-L1 level and appears normal. Normal appearance of the cauda equina . Paraspinal and other soft tissues: Known from visceral imaging. Disc levels: T12- L1: Spondylosis.  No neural impingement L1-L2: Disc height loss and spondylosis.  No neural impingement L2-L3: Disc narrowing and bulging with biforaminal protrusion. Mild facet spurring. L3-L4: Disc narrowing and circumferential bulging with central protrusion. Degenerative facet spurring on both sides. Mild triangular narrowing of the thecal sac. L4-L5: Degenerative facet spurring which is bulky. Mild anterolisthesis. The disc is narrowed and bulging with moderate left subarticular recess stenosis. The foramina are patent L5-S1:Disc narrowing and bulging with right inferior foraminal protrusion. Degenerative facet spurring on both sides. Mild right subarticular recess and foraminal narrowing. IMPRESSION: Thoracic spine: Limited metastatic disease seen at the T9, T10, and T12 bodies. No extraosseous tumor or cord signal abnormality to correlate with weakness. Lumbar spine: 1. L2 vertebral body metastasis, known from  prior PET-CT. No extra-osseous extension, fracture, or other explanation for weakness. 2.  Generalized lumbar spine degeneration as described. Diffusely patent spinal canal. Electronically Signed   By: Dorn Roulette M.D.   On: 07/29/2023 08:57

## 2023-07-29 NOTE — Assessment & Plan Note (Signed)
Due to radiation/immunotherapy.  SOB cough symptoms are improved.  continue prednisone 40mg  daily, recommend patient to taper down to 30mg  daily next week

## 2023-07-29 NOTE — Assessment & Plan Note (Signed)
Off Eliquis 2.5mg  BID due to GI bleeding

## 2023-07-29 NOTE — Assessment & Plan Note (Signed)
Chemotherapy plan as listed above 

## 2023-07-29 NOTE — Assessment & Plan Note (Signed)
Likely due to poor oral intake, deconditoning, dehydration. Seen by cardiology and started on metoprolol  Tachycardia resolved.

## 2023-07-29 NOTE — Assessment & Plan Note (Addendum)
 Stage IV esophageal adenocarcinoma, liver metastatic disease, HER 2 negative. KRAS G12V, TMB 5.3, MSI Stable.  CPS 65%  1st line  FOLFOX , palliative RT --> 12/2022 CT progression--> switch to 5-FU + Nivolumab  Q2 weeks  --> 02/2023 PET progression- pneumonitis--> 03/27/23  3rd line  Taxol  and Cyramza  --> 10/4 CT during admission showed slight decrease of liver lesion size. --> 10/30 PET obtained by radonc -liver lesion 10cm, bone mets. ? Progression.  Refer to [facility] for expert opinion. He declined.  Follow up with palliative care  GI bleeding due to esophageal cancer, s/p  palliative RT CT showed progressive disease. New liver lesion 2.5 cm, enlarging liver masses. Increased right adrenal mass. - disease progression.  Labs are reviewed and discussed with patient. Proceed with cycle 2  FOLFIRI -irinotecan  dose reduction to 100mg /m2

## 2023-07-29 NOTE — Assessment & Plan Note (Addendum)
Refer to physical therapy MRI thoracic lumbar spine w w contrast showed no cord compression.

## 2023-07-29 NOTE — Assessment & Plan Note (Signed)
 Chronic hypokalemia, normal magnesia level.  Cotinue KCL to BID. Marland Kitchen

## 2023-07-30 LAB — CEA: CEA: 124 ng/mL — ABNORMAL HIGH (ref 0.0–4.7)

## 2023-07-31 ENCOUNTER — Other Ambulatory Visit: Payer: Self-pay | Admitting: *Deleted

## 2023-07-31 ENCOUNTER — Inpatient Hospital Stay: Payer: No Typology Code available for payment source | Attending: Oncology

## 2023-07-31 VITALS — BP 112/95 | HR 89 | Temp 96.0°F | Resp 19

## 2023-07-31 DIAGNOSIS — R634 Abnormal weight loss: Secondary | ICD-10-CM | POA: Insufficient documentation

## 2023-07-31 DIAGNOSIS — R29818 Other symptoms and signs involving the nervous system: Secondary | ICD-10-CM | POA: Insufficient documentation

## 2023-07-31 DIAGNOSIS — M4316 Spondylolisthesis, lumbar region: Secondary | ICD-10-CM | POA: Diagnosis not present

## 2023-07-31 DIAGNOSIS — C7951 Secondary malignant neoplasm of bone: Secondary | ICD-10-CM | POA: Diagnosis not present

## 2023-07-31 DIAGNOSIS — R4781 Slurred speech: Secondary | ICD-10-CM | POA: Insufficient documentation

## 2023-07-31 DIAGNOSIS — Z7902 Long term (current) use of antithrombotics/antiplatelets: Secondary | ICD-10-CM | POA: Insufficient documentation

## 2023-07-31 DIAGNOSIS — C7800 Secondary malignant neoplasm of unspecified lung: Secondary | ICD-10-CM | POA: Insufficient documentation

## 2023-07-31 DIAGNOSIS — M47816 Spondylosis without myelopathy or radiculopathy, lumbar region: Secondary | ICD-10-CM | POA: Insufficient documentation

## 2023-07-31 DIAGNOSIS — R630 Anorexia: Secondary | ICD-10-CM | POA: Insufficient documentation

## 2023-07-31 DIAGNOSIS — M51369 Other intervertebral disc degeneration, lumbar region without mention of lumbar back pain or lower extremity pain: Secondary | ICD-10-CM | POA: Insufficient documentation

## 2023-07-31 DIAGNOSIS — G629 Polyneuropathy, unspecified: Secondary | ICD-10-CM | POA: Insufficient documentation

## 2023-07-31 DIAGNOSIS — K2970 Gastritis, unspecified, without bleeding: Secondary | ICD-10-CM | POA: Diagnosis not present

## 2023-07-31 DIAGNOSIS — I700 Atherosclerosis of aorta: Secondary | ICD-10-CM | POA: Diagnosis not present

## 2023-07-31 DIAGNOSIS — Z79631 Long term (current) use of antimetabolite agent: Secondary | ICD-10-CM | POA: Insufficient documentation

## 2023-07-31 DIAGNOSIS — I2510 Atherosclerotic heart disease of native coronary artery without angina pectoris: Secondary | ICD-10-CM | POA: Diagnosis not present

## 2023-07-31 DIAGNOSIS — I10 Essential (primary) hypertension: Secondary | ICD-10-CM | POA: Insufficient documentation

## 2023-07-31 DIAGNOSIS — C155 Malignant neoplasm of lower third of esophagus: Secondary | ICD-10-CM | POA: Diagnosis not present

## 2023-07-31 DIAGNOSIS — J8410 Pulmonary fibrosis, unspecified: Secondary | ICD-10-CM | POA: Insufficient documentation

## 2023-07-31 DIAGNOSIS — K2091 Esophagitis, unspecified with bleeding: Secondary | ICD-10-CM | POA: Diagnosis not present

## 2023-07-31 DIAGNOSIS — Z803 Family history of malignant neoplasm of breast: Secondary | ICD-10-CM | POA: Insufficient documentation

## 2023-07-31 DIAGNOSIS — C159 Malignant neoplasm of esophagus, unspecified: Secondary | ICD-10-CM

## 2023-07-31 DIAGNOSIS — Q2112 Patent foramen ovale: Secondary | ICD-10-CM | POA: Insufficient documentation

## 2023-07-31 DIAGNOSIS — I6523 Occlusion and stenosis of bilateral carotid arteries: Secondary | ICD-10-CM | POA: Insufficient documentation

## 2023-07-31 DIAGNOSIS — I7121 Aneurysm of the ascending aorta, without rupture: Secondary | ICD-10-CM | POA: Insufficient documentation

## 2023-07-31 DIAGNOSIS — M48061 Spinal stenosis, lumbar region without neurogenic claudication: Secondary | ICD-10-CM | POA: Diagnosis not present

## 2023-07-31 DIAGNOSIS — C787 Secondary malignant neoplasm of liver and intrahepatic bile duct: Secondary | ICD-10-CM | POA: Insufficient documentation

## 2023-07-31 DIAGNOSIS — R0602 Shortness of breath: Secondary | ICD-10-CM | POA: Insufficient documentation

## 2023-07-31 DIAGNOSIS — I371 Nonrheumatic pulmonary valve insufficiency: Secondary | ICD-10-CM | POA: Insufficient documentation

## 2023-07-31 DIAGNOSIS — N200 Calculus of kidney: Secondary | ICD-10-CM | POA: Insufficient documentation

## 2023-07-31 DIAGNOSIS — C7971 Secondary malignant neoplasm of right adrenal gland: Secondary | ICD-10-CM | POA: Diagnosis not present

## 2023-07-31 DIAGNOSIS — I871 Compression of vein: Secondary | ICD-10-CM | POA: Diagnosis not present

## 2023-07-31 DIAGNOSIS — Z86718 Personal history of other venous thrombosis and embolism: Secondary | ICD-10-CM | POA: Insufficient documentation

## 2023-07-31 DIAGNOSIS — M47812 Spondylosis without myelopathy or radiculopathy, cervical region: Secondary | ICD-10-CM | POA: Insufficient documentation

## 2023-07-31 DIAGNOSIS — J8489 Other specified interstitial pulmonary diseases: Secondary | ICD-10-CM | POA: Diagnosis not present

## 2023-07-31 DIAGNOSIS — C7972 Secondary malignant neoplasm of left adrenal gland: Secondary | ICD-10-CM | POA: Insufficient documentation

## 2023-07-31 DIAGNOSIS — Z8249 Family history of ischemic heart disease and other diseases of the circulatory system: Secondary | ICD-10-CM | POA: Insufficient documentation

## 2023-07-31 DIAGNOSIS — R29810 Facial weakness: Secondary | ICD-10-CM | POA: Insufficient documentation

## 2023-07-31 DIAGNOSIS — R110 Nausea: Secondary | ICD-10-CM | POA: Insufficient documentation

## 2023-07-31 DIAGNOSIS — R5383 Other fatigue: Secondary | ICD-10-CM | POA: Insufficient documentation

## 2023-07-31 DIAGNOSIS — E876 Hypokalemia: Secondary | ICD-10-CM | POA: Diagnosis not present

## 2023-07-31 DIAGNOSIS — Z79634 Long term (current) use of topoisomerase inhibitor: Secondary | ICD-10-CM | POA: Insufficient documentation

## 2023-07-31 DIAGNOSIS — R059 Cough, unspecified: Secondary | ICD-10-CM | POA: Insufficient documentation

## 2023-07-31 DIAGNOSIS — Z7982 Long term (current) use of aspirin: Secondary | ICD-10-CM | POA: Insufficient documentation

## 2023-07-31 DIAGNOSIS — K760 Fatty (change of) liver, not elsewhere classified: Secondary | ICD-10-CM | POA: Insufficient documentation

## 2023-07-31 DIAGNOSIS — K8020 Calculus of gallbladder without cholecystitis without obstruction: Secondary | ICD-10-CM | POA: Insufficient documentation

## 2023-07-31 DIAGNOSIS — Z79899 Other long term (current) drug therapy: Secondary | ICD-10-CM | POA: Insufficient documentation

## 2023-07-31 DIAGNOSIS — Z8042 Family history of malignant neoplasm of prostate: Secondary | ICD-10-CM | POA: Insufficient documentation

## 2023-07-31 DIAGNOSIS — Z79890 Hormone replacement therapy (postmenopausal): Secondary | ICD-10-CM | POA: Insufficient documentation

## 2023-07-31 MED ORDER — HEPARIN SOD (PORK) LOCK FLUSH 100 UNIT/ML IV SOLN
500.0000 [IU] | Freq: Once | INTRAVENOUS | Status: AC | PRN
  Administered 2023-07-31: 500 [IU]
  Filled 2023-07-31: qty 5

## 2023-07-31 NOTE — Telephone Encounter (Signed)
 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  SOB cough symptoms are improved.  continue prednisone  40mg  daily, recommend patient to taper down to 30mg  daily next week

## 2023-08-01 ENCOUNTER — Encounter: Payer: Self-pay | Admitting: Oncology

## 2023-08-01 MED ORDER — PREDNISONE 10 MG PO TABS: 30.0000 mg | ORAL_TABLET | Freq: Every day | ORAL | 0 refills | Status: DC

## 2023-08-04 ENCOUNTER — Telehealth: Payer: Self-pay | Admitting: Radiation Oncology

## 2023-08-04 ENCOUNTER — Ambulatory Visit: Payer: No Typology Code available for payment source | Admitting: Radiation Oncology

## 2023-08-04 ENCOUNTER — Encounter: Payer: Self-pay | Admitting: Oncology

## 2023-08-04 ENCOUNTER — Ambulatory Visit: Payer: No Typology Code available for payment source

## 2023-08-04 ENCOUNTER — Ambulatory Visit: Payer: No Typology Code available for payment source | Admitting: Oncology

## 2023-08-04 ENCOUNTER — Other Ambulatory Visit: Payer: No Typology Code available for payment source

## 2023-08-04 DIAGNOSIS — Z515 Encounter for palliative care: Secondary | ICD-10-CM | POA: Diagnosis not present

## 2023-08-04 DIAGNOSIS — R630 Anorexia: Secondary | ICD-10-CM | POA: Diagnosis not present

## 2023-08-04 DIAGNOSIS — K117 Disturbances of salivary secretion: Secondary | ICD-10-CM | POA: Diagnosis not present

## 2023-08-04 DIAGNOSIS — Z008 Encounter for other general examination: Secondary | ICD-10-CM | POA: Diagnosis not present

## 2023-08-04 NOTE — Telephone Encounter (Signed)
 Pt called and left a vm stated he will not make appt today and wants to r/s

## 2023-08-05 ENCOUNTER — Inpatient Hospital Stay: Payer: No Typology Code available for payment source

## 2023-08-05 ENCOUNTER — Inpatient Hospital Stay: Payer: No Typology Code available for payment source | Admitting: Oncology

## 2023-08-05 ENCOUNTER — Encounter: Payer: Self-pay | Admitting: Oncology

## 2023-08-05 VITALS — BP 126/95 | HR 93 | Temp 96.2°F | Resp 18 | Wt 173.9 lb

## 2023-08-05 DIAGNOSIS — I82621 Acute embolism and thrombosis of deep veins of right upper extremity: Secondary | ICD-10-CM

## 2023-08-05 DIAGNOSIS — D62 Acute posthemorrhagic anemia: Secondary | ICD-10-CM

## 2023-08-05 DIAGNOSIS — C159 Malignant neoplasm of esophagus, unspecified: Secondary | ICD-10-CM | POA: Diagnosis not present

## 2023-08-05 DIAGNOSIS — R110 Nausea: Secondary | ICD-10-CM

## 2023-08-05 DIAGNOSIS — D500 Iron deficiency anemia secondary to blood loss (chronic): Secondary | ICD-10-CM

## 2023-08-05 DIAGNOSIS — R634 Abnormal weight loss: Secondary | ICD-10-CM

## 2023-08-05 DIAGNOSIS — E876 Hypokalemia: Secondary | ICD-10-CM

## 2023-08-05 DIAGNOSIS — J984 Other disorders of lung: Secondary | ICD-10-CM

## 2023-08-05 DIAGNOSIS — R29898 Other symptoms and signs involving the musculoskeletal system: Secondary | ICD-10-CM

## 2023-08-05 DIAGNOSIS — T451X5A Adverse effect of antineoplastic and immunosuppressive drugs, initial encounter: Secondary | ICD-10-CM | POA: Diagnosis not present

## 2023-08-05 DIAGNOSIS — C155 Malignant neoplasm of lower third of esophagus: Secondary | ICD-10-CM | POA: Diagnosis not present

## 2023-08-05 LAB — CMP (CANCER CENTER ONLY)
ALT: 25 U/L (ref 0–44)
AST: 21 U/L (ref 15–41)
Albumin: 3.4 g/dL — ABNORMAL LOW (ref 3.5–5.0)
Alkaline Phosphatase: 88 U/L (ref 38–126)
Anion gap: 12 (ref 5–15)
BUN: 15 mg/dL (ref 8–23)
CO2: 25 mmol/L (ref 22–32)
Calcium: 9.1 mg/dL (ref 8.9–10.3)
Chloride: 104 mmol/L (ref 98–111)
Creatinine: 0.84 mg/dL (ref 0.61–1.24)
GFR, Estimated: >60 mL/min (ref >60)
Glucose, Bld: 101 mg/dL — ABNORMAL HIGH (ref 70–99)
Potassium: 3.6 mmol/L (ref 3.5–5.1)
Sodium: 141 mmol/L (ref 135–145)
Total Bilirubin: 1 mg/dL (ref 0.0–1.2)
Total Protein: 6.9 g/dL (ref 6.5–8.1)

## 2023-08-05 LAB — CBC WITH DIFFERENTIAL (CANCER CENTER ONLY)
Abs Immature Granulocytes: 0.04 10*3/uL (ref 0.00–0.07)
Basophils Absolute: 0 10*3/uL (ref 0.0–0.1)
Basophils Relative: 0 %
Eosinophils Absolute: 0.1 10*3/uL (ref 0.0–0.5)
Eosinophils Relative: 2 %
HCT: 40.4 % (ref 39.0–52.0)
Hemoglobin: 12.5 g/dL — ABNORMAL LOW (ref 13.0–17.0)
Immature Granulocytes: 1 %
Lymphocytes Relative: 10 %
Lymphs Abs: 0.7 10*3/uL (ref 0.7–4.0)
MCH: 27.4 pg (ref 26.0–34.0)
MCHC: 30.9 g/dL (ref 30.0–36.0)
MCV: 88.6 fL (ref 80.0–100.0)
Monocytes Absolute: 0.2 10*3/uL (ref 0.1–1.0)
Monocytes Relative: 3 %
Neutro Abs: 6.1 10*3/uL (ref 1.7–7.7)
Neutrophils Relative %: 84 %
Platelet Count: 163 10*3/uL (ref 150–400)
RBC: 4.56 MIL/uL (ref 4.22–5.81)
RDW: 16.7 % — ABNORMAL HIGH (ref 11.5–15.5)
WBC Count: 7.2 10*3/uL (ref 4.0–10.5)
nRBC: 0 % (ref 0.0–0.2)

## 2023-08-05 MED ORDER — HEPARIN SOD (PORK) LOCK FLUSH 100 UNIT/ML IV SOLN
500.0000 [IU] | Freq: Once | INTRAVENOUS | Status: AC | PRN
  Administered 2023-08-05: 500 [IU]
  Filled 2023-08-05: qty 5

## 2023-08-05 MED ORDER — DRONABINOL 5 MG PO CAPS: 5.0000 mg | ORAL_CAPSULE | Freq: Two times a day (BID) | ORAL | 0 refills | Status: DC

## 2023-08-05 MED ORDER — SODIUM CHLORIDE 0.9 % IV SOLN
Freq: Once | INTRAVENOUS | Status: AC
  Filled 2023-08-05: qty 250

## 2023-08-05 NOTE — Assessment & Plan Note (Signed)
 Follow up with nutritionist  Continue nutrition supplements.  Add Marinol 5mg  BID. Rx sent.

## 2023-08-05 NOTE — Assessment & Plan Note (Signed)
Off Eliquis 2.5mg  BID due to GI bleeding

## 2023-08-05 NOTE — Assessment & Plan Note (Signed)
 Refer to physical therapy MRI thoracic lumbar spine w w contrast showed no cord compression.

## 2023-08-05 NOTE — Assessment & Plan Note (Signed)
 Continue antiemetics, Olanzapin at bed time.  Add Marinol 5mg  BID

## 2023-08-05 NOTE — Assessment & Plan Note (Signed)
 Due to radiation/immunotherapy.  SOB cough symptoms are improved.   taper down to 30mg  daily this week

## 2023-08-05 NOTE — Assessment & Plan Note (Signed)
 Chronic hypokalemia, normal magnesia level.  Cotinue KCL to BID. Marland Kitchen

## 2023-08-05 NOTE — Progress Notes (Signed)
 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 KRAS G12V, TMB 5.3, MSI Stable.  CPS 65%  1st line  FOLFOX , palliative RT --> 12/2022 CT progression--> switch to 5-FU + Nivolumab  Q2 weeks  --> 02/2023 PET progression- pneumonitis--> 03/27/23  3rd line  Taxol  and Cyramza  --> 10/4 CT during admission showed slight decrease of liver lesion size. --> 10/30 PET obtained by radonc -liver lesion 10cm, bone mets. ? Progression.  Refer to [facility] for expert opinion. He declined.  Follow up with palliative care  GI bleeding due to esophageal cancer, s/p  palliative RT CT showed progressive disease. New liver lesion 2.5 cm, enlarging liver masses. Increased right adrenal mass. - disease progression.  Labs are reviewed and discussed with patient. S/p cycle 2  FOLFIRI -irinotecan  dose reduction to 100mg /m2  Overall he tolerates with mild diarrhea and nausea.    Deep venous thrombosis (HCC) Off Eliquis  2.5mg  BID due to GI bleeding  Hypokalemia Chronic hypokalemia, normal magnesia level.  Cotinue KCL to 40meq BID. 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  SOB cough symptoms are improved.   taper down to 30mg  daily this week    Weakness of both legs Refer to physical therapy MRI thoracic lumbar spine w w contrast showed no cord compression.   Weight loss Follow up with nutritionist  Continue nutrition supplements.  Add Marinol  5mg  BID. Rx sent.   Chemotherapy-induced nausea Continue antiemetics, Olanzapin at bed time.   Add Marinol  5mg  BID   No orders of the defined types were placed in this encounter.   Follow-up 2 weeks  All questions were answered. The patient knows to call the clinic with any problems, questions or concerns.  Call Babara, MD, PhD [facility] Hematology Oncology 08/05/2023   HISTORY OF PRESENTING ILLNESS:  Jonathan [name] 68 y.o. male presents to establish care for esophageal adenocarcinoma I have reviewed his chart and materials related to his cancer extensively and collaborated history with the patient. Summary of oncologic history is as follows: Oncology History  Adenocarcinoma of esophagus (HCC)  08/28/2022 Initial Diagnosis   Adenocarcinoma of esophagus   -Patient has noticed worsening of food stuck/fullness sensation since November 2023.  Patient had a barium swallow study which commented on marked mucosal irregularity in the distal esophagus with Broaddus base mural filling defect highly suspicious for malignancy.  Patient establish care with gastroenterology. -08/23/2022, EGD showed gastritis and partially obstructing malignant esophageal tumor in the lower third of the esophagus. Esophagus mass biopsy showed adenocarcinoma.  PD-L1 TPS 65%, HER2 negative.  [facility] NGS showed KRASG12V, CDKN2A, ARID1A, TP53, TMB 5.3, MSI stable.  Stomach biopsy showed gastric mucosa with no specific histology abnormality.  No significant intestinal metaplastic, dysplastic, granular atrophy or increased inflammation.     08/28/2022 Imaging   CT chest abdomen pelvis with contrast showed 1. Distal esophageal primary with gastrohepatic ligament nodal metastasis. 2. 2 right-sided pulmonary nodules, the largest of which measures 5 mm and is new since 2015. Pulmonary metastasis not be excluded. 3. Anterior right lower lobe volume loss and minimal soft tissue density,  favoring atelectasis or scar. Recommend attention on follow-up. 4. Hepatic steatosis 5. Cholelithiasis 6. Left nephrolithiasis 7.  Coronary artery atherosclerosis. Aortic Atherosclerosis   08/28/2022 Cancer Staging   Staging form: Esophagus - Adenocarcinoma, AJCC 8th Edition - Clinical stage from 08/28/2022: Stage IVB (cT3, cN1, cM1) - Signed by Babara Call, MD on 10/04/2022 Stage prefix: Initial diagnosis   09/06/2022 Imaging   PET scan showed 1. Esophageal primary with gastrohepatic ligament nodal metastasis,as on CT. 2. Focus of hypermetabolism which is favored to registered to the posterior hepatic dome, in the region of subtle heterogeneity on prior diagnostic CT. Suboptimally evaluated secondary to underlying steatosis. Recommend further evaluation with pre and post contrast abdominal MRI to confirm probable metastasis. 3. Incidental findings, including: Left nephrolithiasis.Cholelithiasis. Coronary artery atherosclerosis. Aortic Atherosclerosis    09/23/2022 Imaging   MRI abdomen with and without contrast showed 1. Mildly T2 hyperintense segment VII hepatic lesion measuring 2.7 cm with imaging characteristics compatible with metastatic disease. 2. Tiny focus of delayed enhancement in the inferior right lobe of the liver segment VI measuring 8 mm with ill-defined increased T2 signal and subtle corresponding reduced diffusivity, also suspicious for metastatic disease. 3. Partially visualized distal esophageal wall thickening compatible with the patient's known primary neoplasm. 4. Similar size of the 11 mm gastrohepatic ligament lymph node mildly metabolic on prior PET-CT and compatible with local nodal disease involvement. 5. 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 Recommend follow up pre and post-contrast MRI/MRCP in 1 year. 6. Diffuse hepatic steatosis.   10/10/2022 Procedure   LIVER MASS; CT-GUIDED BIOPSY:  - MODERATE TO POORLY DIFFERENTIATED ADENOCARCINOMA MORPHOLOGICALLY  CONSISTENT WITH METASTASIS FROM PATIENT'S KNOWN ESOPHAGEAL   ADENOCARCINOMA.    10/17/2022 Procedure   Medi port placed by Dr. Marea   10/21/2022 - 01/15/2023 Chemotherapy   Patient is on Treatment Plan : ESOPHAGEAL ADENOCARCINOMA FOLFOX q14d x 6 cycles     01/15/2023 Imaging   PET showed  1. Interval progression in metastatic esophageal carcinoma as evidenced by hypermetabolic lymph nodes in the neck, chest, abdomen and pelvis, an enlarging right hepatic lobe metastasis, new bilateral adrenal metastases and new osseous metastases. 2. Cholelithiasis. 3. Left renal stones. 4. Aortic atherosclerosis (ICD10-I70.0). Coronary artery calcification.    01/27/2023 - 03/14/2023 Chemotherapy   Patient is on Treatment Plan : GASTROESOPHAGEAL FOLFOX + Nivolumab  q14d     03/20/2023 Imaging   CT chest abdomen pelvis w contrast showed 1. Interval progression of metastatic disease with multiple new and enlarged hypodense lesions throughout the liver. 2. Significant enlargement of bilateral adrenal metastases. 3. Subtle sclerosis of an osseous metastasis of the right femoral neck. Other previously FDG avid osseous metastases of the left femoral neck and right sixth rib not appreciated by CT. 4. No persistently enlarged lymph nodes. 5. Interval development of bandlike consolidation and fibrosis of the paramedian right lung, particularly of the right lower lobe, as well as additional scattered irregular opacities throughout the right lung. 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 6. Similar circumferential wall thickening throughout the mid to lower esophagus, consistent with known primary esophageal adenocarcinoma. 7. Nonobstructive bilateral nephrolithiasis. Aortic Atherosclerosis   03/27/2023 - 06/19/2023 Chemotherapy   Patient is on Treatment Plan : GASTROESOPHAGEAL Ramucirumab  D1, 15 + Paclitaxel  D1,8,15 q28d     05/02/2023 - 05/08/2023 Hospital Admission   Admission due to coffee-ground emesis. EGD 10/7 showed  bleeding from esophageal cancer, esophagitis  due to chemo and radiation. Applied Hemospray. Eliquis  was stopped. Acute blood loss anemia, received IV venofer  treatments x 3 doses    05/28/2023 Imaging   1. Progressive hypermetabolism in the distal esophagus consistent with progressive disease. 2. Significant progression of metastatic hepatic disease (new and enlarging lesions). 3. Enlarging and progressively hypermetabolic right adrenal gland mass. 4. New hypermetabolic bone lesions in the T12 and L2 vertebral bodies, left sacrum and right sixth posterior rib consistent with metastatic disease. 5. Possible hypermetabolic brain lesions. MRI suggested for further evaluation. 6. Chronic medial right lower lobe atelectasis likely radiation change. Moderate hypermetabolism but no obvious tumor. 7. Stable gallstones, renal calculi and vascular calcifications.   06/19/2023 Imaging   CT chest angiogram w contrast, CT abdomen pelvis w contrast  1. Negative for pulmonary embolus. 2. Persistent lower esophageal wall thickening with progressive hepatic and right adrenal metastatic disease, compatible with stage IV esophageal carcinoma, as on PET 05/21/2023. 3. 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  4. 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 5. 4.0 cm ascending aortic aneurysm. Recommend annual imaging follow up by CTA or MRA. This recommendation follows 2010 ACCF/AHA/AATS/ACR/ASA/SCA/SCAI/SIR/STS/SVM Guidelines for the Diagnosis and Management of Patients with Thoracic Aortic Disease.Circulation. 2010; 121: [id number]. Aortic aneurysm NOS (ICD10-I71.9). 6. Cholelithiasis. 7. Small left renal stone. 8. Aortic atherosclerosis (ICD10-I70.0). Left and descending coronary artery calcification.   06/30/2023 -   Chemotherapy   Patient is on Treatment Plan : COLORECTAL FOLFIRI q14d     Patient presents to establish care.  He is not taking PPI. He has intentionally lost some weight. Family history positive for father and paternal uncle with prostate cancer and sister with breast cancer. Denies any routine alcohol use  Right interval jugular vein occlusive DVT, started on Eliquis  starter package on 10/28/2022, swelling of neck has improved.   CXR showed Interval development of bilateral perihilar interstitial opacities  He was treated with Azithromycin  and Doxycycline  to cover atypical infections.   INTERVAL HISTORY Jonathan [name] is a 68 y.o. male who has above history reviewed by me today presents for follow up visit for Stage IV esophageal adenocarcinoma.  No additionally hematemesis episode.  He continues to have nasal congestion, postnatal drip, mucus production which causes nausea.  currently on Prednisone  30mg  daily. Chronic SOB/ cough. He takes PPI.  + neuropathy of finger tips, toes/bottom of feet. Rubbery + weakness of bilateral lower extremities.  + weakness/tired after chemo.  +  little loose BM for a few days, improved he did not need to take anti diarrhea medication.  + Nausea after chemotherapy, takes antiemetics.      MEDICAL HISTORY:  Past Medical History:  Diagnosis Date   Arthritis    Complication of anesthesia    OCCURRED ONCE YEARS AGO 1981   Deep venous thrombosis (HCC) 10/28/2022   Esophageal mass    Essential hypertension 05/01/2023   Hypertension    Hypothyroidism    PONV (postoperative nausea and vomiting)     SURGICAL HISTORY: Past Surgical History:  Procedure Laterality Date   COLONOSCOPY     ESOPHAGOGASTRODUODENOSCOPY N/A 08/23/2022   Procedure: ESOPHAGOGASTRODUODENOSCOPY (EGD);  Surgeon: Onita Elspeth Sharper, DO;  Location: [facility] ENDOSCOPY;  Service: Gastroenterology;  Laterality: N/A;   ESOPHAGOGASTRODUODENOSCOPY (EGD) WITH PROPOFOL  N/A 05/05/2023    Procedure: ESOPHAGOGASTRODUODENOSCOPY (EGD) WITH PROPOFOL ;  Surgeon: Onita Elspeth Sharper, DO;  Location: [facility] ENDOSCOPY;  Service: Gastroenterology;  Laterality: N/A;   EUS N/A 09/12/2022   Procedure: FULL UPPER ENDOSCOPIC ULTRASOUND (EUS) RADIAL;  Surgeon: Queenie Asberry LABOR, MD;  Location: [facility] ENDOSCOPY;  Service: Gastroenterology;  Laterality: N/A;   FINGER SURGERY     HEMOSTASIS CONTROL  05/05/2023   Procedure: HEMOSTASIS CONTROL;  Surgeon: Onita Elspeth Sharper, DO;  Location: [facility] ENDOSCOPY;  Service: Gastroenterology;;   PORTA CATH INSERTION N/A 10/17/2022   Procedure: PORTA CATH INSERTION;  Surgeon: Marea Selinda RAMAN, MD;  Location: [facility] INVASIVE CV LAB;  Service: Cardiovascular;  Laterality: N/A;   TENDON REPAIR IN LEFT KNEE      SOCIAL HISTORY: Social History   Socioeconomic History   Marital status: Single    Spouse name: Not on file   Number of children: Not on file   Years of education: Not on file   Highest education level: Not on file  Occupational History   Not on file  Tobacco Use   Smoking status: Never   Smokeless tobacco: Never  Vaping Use   Vaping status: Never Used  Substance and Sexual Activity   Alcohol use: Yes    Comment: OCCASIONALLY   Drug use: Never   Sexual activity: Not on file  Other Topics Concern   Not on file  Social History Narrative   Not on file   Social Drivers of Health   Financial Resource Strain: Low Risk  (10/29/2022)   Received [facility]   Overall Financial Resource Strain (CARDIA)    Difficulty of Paying Living Expenses: Not hard at all  Food Insecurity: No Food Insecurity (05/30/2023)   Hunger Vital Sign    Worried About Running Out of Food in the Last Year: Never true    Ran Out of Food in the Last Year: Never true  Transportation Needs: No Transportation Needs (05/30/2023)   PRAPARE - Administrator, Civil Service (Medical): No    Lack of Transportation  (Non-Medical): No  Physical Activity: Not on file  Stress: Not on file  Social Connections: Not on file  Intimate Partner Violence: Not At Risk (05/30/2023)   Humiliation, Afraid, Rape, and Kick questionnaire    Fear of Current or Ex-Partner: No    Emotionally Abused: No    Physically Abused: No    Sexually Abused: No    FAMILY HISTORY: Family History  Problem Relation Age of Onset   Heart attack Mother    Prostate cancer Father    Breast cancer Sister    Prostate cancer Paternal Uncle     ALLERGIES:  has no known allergies.  MEDICATIONS:  Current Outpatient Medications  Medication Sig Dispense Refill   acetaminophen  (TYLENOL ) 650 MG CR tablet Take 1,300 mg by mouth every 8 (eight) hours as needed for pain.     azelastine  (ASTELIN ) 0.1 % nasal spray Place 1 spray into both nostrils 2 (two) times daily. Use in each nostril as directed 30 mL 1   chlorhexidine  (PERIDEX ) 0.12 % solution USE AS DIRECTED TAKE  15  ML  IN  THE  MOUTH  OR  THROAT  TWICE  DAILY 473 mL 0   dronabinol  (MARINOL ) 5 MG capsule Take 1 capsule (5 mg total) by mouth 2 (two) times daily before lunch and supper. 60 capsule 0   gentamicin  ointment (GARAMYCIN ) 0.1 % Apply 1 Application topically 2 (two) times daily as needed.     levothyroxine  (SYNTHROID ) 125 MCG tablet Take 125 mcg by mouth  every morning.     loperamide  (IMODIUM ) 2 MG capsule Take 2 tabs by mouth with first loose stool, then 1 tab with each additional loose stool as needed. Do not exceed 8 tabs in a 24-hour period 90 capsule 0   magic mouthwash (multi-ingredient) oral suspension Swish and spit 5-10 mLs by mouth 4 (four) times daily as needed. 480 mL 1   metoprolol  tartrate (LOPRESSOR ) 25 MG tablet Take 1 tablet (25 mg total) by mouth 2 (two) times daily. 180 tablet 3   OLANZapine  (ZYPREXA ) 10 MG tablet Take 0.5-1 tablets (5-10 mg total) by mouth at bedtime as needed (nausea). 30 tablet 3   ondansetron  (ZOFRAN ) 8 MG tablet Take 1 tablet (8 mg total) by  mouth every 8 (eight) hours as needed for nausea or vomiting. Start on the third day after chemotherapy. 90 tablet 1   pantoprazole  (PROTONIX ) 40 MG tablet Take 1 tablet (40 mg total) by mouth 2 (two) times daily. 60 tablet 3   potassium chloride  SA (KLOR-CON  M) 20 MEQ tablet Take 2 tablets (40 mEq total) by mouth 2 (two) times daily. 60 tablet 1   predniSONE  (DELTASONE ) 10 MG tablet Take 3 tablets (30 mg total) by mouth daily with breakfast. 90 tablet 0   predniSONE  (DELTASONE ) 20 MG tablet Take 2 tablets (40 mg total) by mouth daily with breakfast. 60 tablet 0   prochlorperazine  (COMPAZINE ) 10 MG tablet Take 10 mg by mouth every 6 (six) hours as needed.     No current facility-administered medications for this visit.   Facility-Administered Medications Ordered in Other Visits  Medication Dose Route Frequency Provider Last Rate Last Admin   heparin  lock flush 100 unit/mL  500 Units Intracatheter Once PRN Babara Call, MD        Review of Systems  Constitutional:  Positive for appetite change and fatigue. Negative for chills and fever.  HENT:   Negative for hearing loss and voice change.        Nasal congestion, postnasal drip.   Eyes:  Negative for eye problems and icterus.  Respiratory:  Negative for chest tightness, cough and shortness of breath.   Cardiovascular:  Negative for chest pain and leg swelling.  Gastrointestinal:  Positive for nausea. Negative for abdominal distention, abdominal pain and vomiting.  Endocrine: Negative for hot flashes.  Genitourinary:  Negative for difficulty urinating, dysuria and frequency.   Musculoskeletal:  Negative for arthralgias.  Skin:  Negative for itching and rash.  Neurological:  Positive for extremity weakness and numbness. Negative for light-headedness.  Hematological:  Negative for adenopathy. Does not bruise/bleed easily.  Psychiatric/Behavioral:  Negative for confusion.      PHYSICAL EXAMINATION: ECOG PERFORMANCE STATUS: 0 -  Asymptomatic  Vitals:   08/05/23 0857  BP: (!) 126/95  Pulse: 93  Resp: 18  Temp: (!) 96.2 F (35.7 C)  SpO2: 99%   Filed Weights   08/05/23 0857  Weight: 173 lb 14.4 oz (78.9 kg)    Physical Exam Constitutional:      General: He is not in acute distress.    Appearance: He is obese. He is not diaphoretic.  HENT:     Head: Normocephalic and atraumatic.  Eyes:     General: No scleral icterus. Cardiovascular:     Rate and Rhythm: Normal rate and regular rhythm.  Pulmonary:     Effort: Pulmonary effort is normal. No respiratory distress.     Breath sounds: No wheezing.  Abdominal:     General: There is  no distension.     Palpations: Abdomen is soft.  Musculoskeletal:        General: Normal range of motion.     Cervical back: Normal range of motion and neck supple.  Skin:    General: Skin is warm and dry.     Findings: No erythema.  Neurological:     Mental Status: He is alert and oriented to person, place, and time. Mental status is at baseline.     Cranial Nerves: No cranial nerve deficit.     Motor: No abnormal muscle tone.  Psychiatric:        Mood and Affect: Mood and affect normal.      LABORATORY DATA:  I have reviewed the data as listed    Latest Ref Rng & Units 08/05/2023    8:34 AM 07/29/2023    7:58 AM 07/14/2023   10:12 AM  CBC  WBC 4.0 - 10.5 K/uL 7.2  15.1  4.7   Hemoglobin 13.0 - 17.0 g/dL 87.4  87.7  87.6   Hematocrit 39.0 - 52.0 % 40.4  39.4  39.6   Platelets 150 - 400 K/uL 163  219  236       Latest Ref Rng & Units 08/05/2023    8:34 AM 07/29/2023    7:58 AM 07/14/2023   10:12 AM  CMP  Glucose 70 - 99 mg/dL 898  884  893   BUN 8 - 23 mg/dL 15  10  15    Creatinine 0.61 - 1.24 mg/dL 9.15  9.19  8.88   Sodium 135 - 145 mmol/L 141  138  141   Potassium 3.5 - 5.1 mmol/L 3.6  3.4  3.4   Chloride 98 - 111 mmol/L 104  103  103   CO2 22 - 32 mmol/L 25  24  24    Calcium  8.9 - 10.3 mg/dL 9.1  9.1  9.7   Total Protein 6.5 - 8.1 g/dL 6.9  6.8  7.3    Total Bilirubin 0.0 - 1.2 mg/dL 1.0  0.6  0.9   Alkaline Phos 38 - 126 U/L 88  74  56   AST 15 - 41 U/L 21  24  24    ALT 0 - 44 U/L 25  23  21       RADIOGRAPHIC STUDIES: I have personally reviewed the radiological images as listed and agreed with the findings in the report. MR Lumbar Spine W Wo Contrast Result Date: 07/29/2023 CLINICAL DATA:  Esophageal cancer being treated with chemotherapy. New weakness in both legs. EXAM: MRI THORACIC AND LUMBAR SPINE WITHOUT AND WITH CONTRAST TECHNIQUE: Multiplanar and multiecho pulse sequences of the thoracic and lumbar spine were obtained without and with intravenous contrast. CONTRAST:  7.5mL GADAVIST  GADOBUTROL  1 MMOL/ML IV SOLN COMPARISON:  Head CT 05/21/2023 and body CT 06/19/2023 FINDINGS: MRI THORACIC SPINE FINDINGS Alignment:  Mild degenerative anterolisthesis at T2-3. Vertebrae: Accentuated fatty marrow at T4-T10, likely related to esophageal radiation. Small lesion in the T12 body, compatible with metastatic disease when compared to prior PET-CT. No underlying fracture. Small lesions in the T9 and T10 vertebral body could be additional metastatic deposits. No extraosseous tumor extension. Cord:  Normal signal and morphology. Paraspinal and other soft tissues: Known liver and right adrenal masses. Disc levels: Generalized disc narrowing and desiccation with endplate and facet spurring. MRI LUMBAR SPINE FINDINGS Segmentation:  Standard. Alignment:  Slight L4-5 anterolisthesis.  Dextroscoliosis. Vertebrae: Infiltrative bone marrow signal abnormality at L2, correlating with PET CT. No  extraosseous tumor or insufficiency fracture. No incidental bone infection. Conus medullaris: Extends to the T12-L1 level and appears normal. Normal appearance of the cauda equina . Paraspinal and other soft tissues: Known from visceral imaging. Disc levels: T12- L1: Spondylosis.  No neural impingement L1-L2: Disc height loss and spondylosis.  No neural impingement L2-L3: Disc  narrowing and bulging with biforaminal protrusion. Mild facet spurring. L3-L4: Disc narrowing and circumferential bulging with central protrusion. Degenerative facet spurring on both sides. Mild triangular narrowing of the thecal sac. L4-L5: Degenerative facet spurring which is bulky. Mild anterolisthesis. The disc is narrowed and bulging with moderate left subarticular recess stenosis. The foramina are patent L5-S1:Disc narrowing and bulging with right inferior foraminal protrusion. Degenerative facet spurring on both sides. Mild right subarticular recess and foraminal narrowing. IMPRESSION: Thoracic spine: Limited metastatic disease seen at the T9, T10, and T12 bodies. No extraosseous tumor or cord signal abnormality to correlate with weakness. Lumbar spine: 1. L2 vertebral body metastasis, known from prior PET-CT. No extra-osseous extension, fracture, or other explanation for weakness. 2. Generalized lumbar spine degeneration as described. Diffusely patent spinal canal. Electronically Signed   By: Dorn Roulette M.D.   On: 07/29/2023 08:57   MR Thoracic Spine W Wo Contrast Result Date: 07/29/2023 CLINICAL DATA:  Esophageal cancer being treated with chemotherapy. New weakness in both legs. EXAM: MRI THORACIC AND LUMBAR SPINE WITHOUT AND WITH CONTRAST TECHNIQUE: Multiplanar and multiecho pulse sequences of the thoracic and lumbar spine were obtained without and with intravenous contrast. CONTRAST:  7.5mL GADAVIST  GADOBUTROL  1 MMOL/ML IV SOLN COMPARISON:  Head CT 05/21/2023 and body CT 06/19/2023 FINDINGS: MRI THORACIC SPINE FINDINGS Alignment:  Mild degenerative anterolisthesis at T2-3. Vertebrae: Accentuated fatty marrow at T4-T10, likely related to esophageal radiation. Small lesion in the T12 body, compatible with metastatic disease when compared to prior PET-CT. No underlying fracture. Small lesions in the T9 and T10 vertebral body could be additional metastatic deposits. No extraosseous tumor extension.  Cord:  Normal signal and morphology. Paraspinal and other soft tissues: Known liver and right adrenal masses. Disc levels: Generalized disc narrowing and desiccation with endplate and facet spurring. MRI LUMBAR SPINE FINDINGS Segmentation:  Standard. Alignment:  Slight L4-5 anterolisthesis.  Dextroscoliosis. Vertebrae: Infiltrative bone marrow signal abnormality at L2, correlating with PET CT. No extraosseous tumor or insufficiency fracture. No incidental bone infection. Conus medullaris: Extends to the T12-L1 level and appears normal. Normal appearance of the cauda equina . Paraspinal and other soft tissues: Known from visceral imaging. Disc levels: T12- L1: Spondylosis.  No neural impingement L1-L2: Disc height loss and spondylosis.  No neural impingement L2-L3: Disc narrowing and bulging with biforaminal protrusion. Mild facet spurring. L3-L4: Disc narrowing and circumferential bulging with central protrusion. Degenerative facet spurring on both sides. Mild triangular narrowing of the thecal sac. L4-L5: Degenerative facet spurring which is bulky. Mild anterolisthesis. The disc is narrowed and bulging with moderate left subarticular recess stenosis. The foramina are patent L5-S1:Disc narrowing and bulging with right inferior foraminal protrusion. Degenerative facet spurring on both sides. Mild right subarticular recess and foraminal narrowing. IMPRESSION: Thoracic spine: Limited metastatic disease seen at the T9, T10, and T12 bodies. No extraosseous tumor or cord signal abnormality to correlate with weakness. Lumbar spine: 1. L2 vertebral body metastasis, known from prior PET-CT. No extra-osseous extension, fracture, or other explanation for weakness. 2. Generalized lumbar spine degeneration as described. Diffusely patent spinal canal. Electronically Signed   By: Dorn Roulette M.D.   On: 07/29/2023 08:57

## 2023-08-05 NOTE — Assessment & Plan Note (Addendum)
 Stage IV esophageal adenocarcinoma, liver metastatic disease, HER 2 negative. KRAS G12V, TMB 5.3, MSI Stable.  CPS 65%  1st line  FOLFOX , palliative RT --> 12/2022 CT progression--> switch to 5-FU + Nivolumab  Q2 weeks  --> 02/2023 PET progression- pneumonitis--> 03/27/23  3rd line  Taxol  and Cyramza  --> 10/4 CT during admission showed slight decrease of liver lesion size. --> 10/30 PET obtained by radonc -liver lesion 10cm, bone mets. ? Progression.  Refer to [facility] for expert opinion. He declined.  Follow up with palliative care  GI bleeding due to esophageal cancer, s/p  palliative RT CT showed progressive disease. New liver lesion 2.5 cm, enlarging liver masses. Increased right adrenal mass. - disease progression.  Labs are reviewed and discussed with patient. S/p cycle 2  FOLFIRI -irinotecan  dose reduction to 100mg /m2  Overall he tolerates with mild diarrhea and nausea.

## 2023-08-07 ENCOUNTER — Inpatient Hospital Stay (HOSPITAL_BASED_OUTPATIENT_CLINIC_OR_DEPARTMENT_OTHER): Payer: No Typology Code available for payment source | Admitting: Hospice and Palliative Medicine

## 2023-08-07 ENCOUNTER — Inpatient Hospital Stay: Payer: No Typology Code available for payment source

## 2023-08-07 DIAGNOSIS — C159 Malignant neoplasm of esophagus, unspecified: Secondary | ICD-10-CM

## 2023-08-07 DIAGNOSIS — Z515 Encounter for palliative care: Secondary | ICD-10-CM | POA: Diagnosis not present

## 2023-08-07 DIAGNOSIS — G4733 Obstructive sleep apnea (adult) (pediatric): Secondary | ICD-10-CM | POA: Diagnosis not present

## 2023-08-07 NOTE — Progress Notes (Signed)
 Nutrition Follow-up:  Patient with esophageal cancer.  Patient receiving folfiri, 4th line treatment    Spoke with patient via phone for nutrition follow-up.  Reports that his appetite is not that great.  Picked up some cashews and roast beef and ham today for sandwiches.  Drank a protein drink and oatmeal cookies so far today.  Has been drinking carnation breakfast essentials mixed with whole milk.  Has some chicken salad on hand as well.  Marinol  was not ready for pick up so he has not started that yet.  Felt nauseated yesterday.  Has medications on hand to help with nausea.      Medications: marinol  added  Labs: reviewed  Anthropometrics:   Weight 173 lb 14.4 oz on 1/7 180 lb 8 oz on 12/16 188 lb on 12/2 189 lb 9.6 oz on 11/21 198 lb on 06/05/23   NUTRITION DIAGNOSIS: Inadequate oral intake ongoing   INTERVENTION:  Continue carnation breakfast essentials mixed with whole milk as often as able.  Knows can add ice cream for added calories Continue snacking q 2 hours on high calorie foods (nuts, chicken salad, etc) Start marinol  when able to pick up.     MONITORING, EVALUATION, GOAL: weight trends, intake   NEXT VISIT: Thursday, Jan 30th phone call  Jadarious Dobbins B. Dasie, RD, LDN Registered Dietitian [phone]

## 2023-08-07 NOTE — Progress Notes (Signed)
 Virtual Visit via Telephone Note  I connected with Jonathan [name] on 08/07/23 at  3:20 PM EST by telephone and verified that I am speaking with the correct person using two identifiers.  Location: Patient: Home Provider: Clinic   I discussed the limitations, risks, security and privacy concerns of performing an evaluation and management service by telephone and the availability of in person appointments. I also discussed with the patient that there may be a patient responsible charge related to this service. The patient expressed understanding and agreed to proceed.   History of Present Illness: Jonathan [name] is a 68 y.o. 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  He was referred to palliative care to address goals.    Observations/Objective: I spoke with patient by phone.  Patient says he is doing reasonably well.  Denies significant changes or concerns.  Does endorse persistently poor oral intake.  He saw Dr. Babara recently who ordered Marinol  but patient has not yet picked this up from the pharmacy.  Patient also on olanzapine .  Denies pain or difficulty swallowing.  Denies significant anxiety or depression.  Assessment and Plan: Stage IV esophageal cancer -on FOLFIRI  Poor appetite - has Marinol  ordered but has not yet picked up from the pharmacy  ACP - SW referral to update  Follow Up Instructions: Follow-up telephone visit 1 to 2 months   I discussed the assessment and treatment plan with the patient. The patient was provided an opportunity to ask questions and all were answered. The patient agreed with the plan and demonstrated an understanding of the instructions.   The patient was advised to call back or seek an in-person evaluation if the symptoms worsen or if the condition fails to improve as anticipated.  I provided 10 minutes  of non-face-to-face time during this encounter.   FONDA JONELLE MOWER, NP

## 2023-08-07 NOTE — Progress Notes (Signed)
 [facility] Clinical Social Work  Initial Assessment   Jonathan [name] is a 68 y.o. year old male contacted by phone. Clinical Social Work was referred by medical provider, [employer], for assessment of psychosocial needs.   SDOH (Social Determinants of Health) assessments performed: Yes SDOH Interventions    Flowsheet Row Care Coordination from 05/30/2023 in [facility] Care Coordination Office Visit from 08/28/2022 in [facility] [place]  SDOH Interventions    Food Insecurity Interventions Intervention Not Indicated Intervention Not Indicated  Housing Interventions Intervention Not Indicated Intervention Not Indicated  Transportation Interventions Intervention Not Indicated Intervention Not Indicated  Utilities Interventions Intervention Not Indicated Intervention Not Indicated       SDOH Screenings   Food Insecurity: No Food Insecurity (05/30/2023)  Housing: Low Risk  (05/30/2023)  Transportation Needs: No Transportation Needs (05/30/2023)  Utilities: Not At Risk (05/30/2023)  Depression (PHQ2-9): Low Risk  (08/28/2022)  Financial Resource Strain: Low Risk  (10/29/2022)   Received from [facility]  Tobacco Use: Low Risk  (08/05/2023)     Distress Screen completed: No    08/27/2022    4:00 PM  ONCBCN DISTRESS SCREENING  Screening Type Initial Screening  Distress experienced in past week (1-10) 0      Family/Social Information:  Housing Arragement: patient lives alone. Family members/support persons in your life? Family and Friends.  Patient's sister and her husband are very supportive. Transportation concerns: no  Employment: Retired Income source: Actor concerns: No Type of concern: None Food access concerns: no Religious or spiritual practice: Yes-Being in nature. Services Currently in place:  [facility]  Coping/  Adjustment to diagnosis: Patient understands treatment plan and what happens next? yes Concerns about diagnosis and/or treatment: How will I care for myself Patient reported stressors: Adjusting to my illness Hopes and/or priorities: to remain as independent as possible. Patient enjoys watching TV and time with family/ friends Current coping skills/ strengths: Active sense of humor , Average or above average intelligence , Capable of independent living , Communication skills , Financial means , General fund of knowledge , and Supportive family/friends     SUMMARY: Current SDOH Barriers:  Level of care concerns  Clinical Social Work Clinical Goal(s):  Patient will work with SW to address concerns related to end of life care.  Interventions: Discussed common feeling and emotions when being diagnosed with cancer, and the importance of support during treatment Informed patient of the support team roles and support services at [facility] Provided CSW contact information and encouraged patient to call with any questions or concerns Provided patient with information about Advance Directives.  He stated his sister is his POA, but is not sure about HCPOA and Living Will.   Follow Up Plan: CSW will see patient on Monday, 1/15, during his infusion. Patient verbalizes understanding of plan: Yes    Macario HERO Ailynn Gow, LCSW Clinical Social Worker [facility]

## 2023-08-11 ENCOUNTER — Observation Stay: Payer: No Typology Code available for payment source

## 2023-08-11 ENCOUNTER — Observation Stay
Admission: EM | Admit: 2023-08-11 | Discharge: 2023-08-12 | Disposition: A | Discharge status: Home / Self Care | Payer: No Typology Code available for payment source | Attending: Student | Admitting: Student

## 2023-08-11 ENCOUNTER — Other Ambulatory Visit: Payer: Self-pay

## 2023-08-11 ENCOUNTER — Inpatient Hospital Stay (HOSPITAL_BASED_OUTPATIENT_CLINIC_OR_DEPARTMENT_OTHER): Payer: No Typology Code available for payment source | Admitting: Oncology

## 2023-08-11 ENCOUNTER — Emergency Department: Payer: No Typology Code available for payment source

## 2023-08-11 ENCOUNTER — Encounter: Payer: Self-pay | Admitting: Oncology

## 2023-08-11 ENCOUNTER — Inpatient Hospital Stay: Payer: No Typology Code available for payment source

## 2023-08-11 DIAGNOSIS — Z7902 Long term (current) use of antithrombotics/antiplatelets: Secondary | ICD-10-CM | POA: Insufficient documentation

## 2023-08-11 DIAGNOSIS — R2681 Unsteadiness on feet: Secondary | ICD-10-CM | POA: Diagnosis not present

## 2023-08-11 DIAGNOSIS — I10 Essential (primary) hypertension: Secondary | ICD-10-CM | POA: Insufficient documentation

## 2023-08-11 DIAGNOSIS — R531 Weakness: Secondary | ICD-10-CM | POA: Diagnosis present

## 2023-08-11 DIAGNOSIS — Z8501 Personal history of malignant neoplasm of esophagus: Secondary | ICD-10-CM | POA: Insufficient documentation

## 2023-08-11 DIAGNOSIS — E876 Hypokalemia: Secondary | ICD-10-CM | POA: Diagnosis not present

## 2023-08-11 DIAGNOSIS — C159 Malignant neoplasm of esophagus, unspecified: Secondary | ICD-10-CM

## 2023-08-11 DIAGNOSIS — M6281 Muscle weakness (generalized): Secondary | ICD-10-CM | POA: Insufficient documentation

## 2023-08-11 DIAGNOSIS — R4781 Slurred speech: Secondary | ICD-10-CM | POA: Diagnosis not present

## 2023-08-11 DIAGNOSIS — Z7901 Long term (current) use of anticoagulants: Secondary | ICD-10-CM | POA: Diagnosis not present

## 2023-08-11 DIAGNOSIS — I700 Atherosclerosis of aorta: Secondary | ICD-10-CM | POA: Diagnosis not present

## 2023-08-11 DIAGNOSIS — R4182 Altered mental status, unspecified: Secondary | ICD-10-CM | POA: Diagnosis not present

## 2023-08-11 DIAGNOSIS — G459 Transient cerebral ischemic attack, unspecified: Secondary | ICD-10-CM | POA: Diagnosis not present

## 2023-08-11 DIAGNOSIS — R29818 Other symptoms and signs involving the nervous system: Secondary | ICD-10-CM | POA: Diagnosis not present

## 2023-08-11 DIAGNOSIS — D849 Immunodeficiency, unspecified: Secondary | ICD-10-CM | POA: Insufficient documentation

## 2023-08-11 DIAGNOSIS — Z6825 Body mass index (BMI) 25.0-25.9, adult: Secondary | ICD-10-CM | POA: Diagnosis not present

## 2023-08-11 DIAGNOSIS — E039 Hypothyroidism, unspecified: Secondary | ICD-10-CM | POA: Insufficient documentation

## 2023-08-11 DIAGNOSIS — I6523 Occlusion and stenosis of bilateral carotid arteries: Secondary | ICD-10-CM | POA: Diagnosis not present

## 2023-08-11 DIAGNOSIS — I82621 Acute embolism and thrombosis of deep veins of right upper extremity: Secondary | ICD-10-CM

## 2023-08-11 DIAGNOSIS — Z7982 Long term (current) use of aspirin: Secondary | ICD-10-CM | POA: Insufficient documentation

## 2023-08-11 DIAGNOSIS — C155 Malignant neoplasm of lower third of esophagus: Secondary | ICD-10-CM | POA: Diagnosis not present

## 2023-08-11 LAB — CBC WITH DIFFERENTIAL (CANCER CENTER ONLY)
Abs Immature Granulocytes: 0.03 10*3/uL (ref 0.00–0.07)
Basophils Absolute: 0 10*3/uL (ref 0.0–0.1)
Basophils Relative: 0 %
Eosinophils Absolute: 0.1 10*3/uL (ref 0.0–0.5)
Eosinophils Relative: 1 %
HCT: 39.1 % (ref 39.0–52.0)
Hemoglobin: 12.3 g/dL — ABNORMAL LOW (ref 13.0–17.0)
Immature Granulocytes: 0 %
Lymphocytes Relative: 11 %
Lymphs Abs: 0.8 10*3/uL (ref 0.7–4.0)
MCH: 27.5 pg (ref 26.0–34.0)
MCHC: 31.5 g/dL (ref 30.0–36.0)
MCV: 87.3 fL (ref 80.0–100.0)
Monocytes Absolute: 0.4 10*3/uL (ref 0.1–1.0)
Monocytes Relative: 6 %
Neutro Abs: 5.6 10*3/uL (ref 1.7–7.7)
Neutrophils Relative %: 82 %
Platelet Count: 229 10*3/uL (ref 150–400)
RBC: 4.48 MIL/uL (ref 4.22–5.81)
RDW: 17.1 % — ABNORMAL HIGH (ref 11.5–15.5)
WBC Count: 6.9 10*3/uL (ref 4.0–10.5)
nRBC: 0 % (ref 0.0–0.2)

## 2023-08-11 LAB — DIFFERENTIAL
Abs Immature Granulocytes: 0.02 10*3/uL (ref 0.00–0.07)
Basophils Absolute: 0 10*3/uL (ref 0.0–0.1)
Basophils Relative: 0 %
Eosinophils Absolute: 0 10*3/uL (ref 0.0–0.5)
Eosinophils Relative: 1 %
Immature Granulocytes: 0 %
Lymphocytes Relative: 10 %
Lymphs Abs: 0.7 10*3/uL (ref 0.7–4.0)
Monocytes Absolute: 0.4 10*3/uL (ref 0.1–1.0)
Monocytes Relative: 5 %
Neutro Abs: 5.4 10*3/uL (ref 1.7–7.7)
Neutrophils Relative %: 84 %

## 2023-08-11 LAB — COMPREHENSIVE METABOLIC PANEL
ALT: 22 U/L (ref 0–44)
AST: 17 U/L (ref 15–41)
Albumin: 3.5 g/dL (ref 3.5–5.0)
Alkaline Phosphatase: 89 U/L (ref 38–126)
Anion gap: 13 (ref 5–15)
BUN: 13 mg/dL (ref 8–23)
CO2: 22 mmol/L (ref 22–32)
Calcium: 9 mg/dL (ref 8.9–10.3)
Chloride: 102 mmol/L (ref 98–111)
Creatinine, Ser: 0.87 mg/dL (ref 0.61–1.24)
GFR, Estimated: >60 mL/min (ref >60)
Glucose, Bld: 84 mg/dL (ref 70–99)
Potassium: 2.9 mmol/L — ABNORMAL LOW (ref 3.5–5.1)
Sodium: 137 mmol/L (ref 135–145)
Total Bilirubin: 0.9 mg/dL (ref 0.0–1.2)
Total Protein: 6.8 g/dL (ref 6.5–8.1)

## 2023-08-11 LAB — CMP (CANCER CENTER ONLY)
ALT: 21 U/L (ref 0–44)
AST: 23 U/L (ref 15–41)
Albumin: 3.4 g/dL — ABNORMAL LOW (ref 3.5–5.0)
Alkaline Phosphatase: 90 U/L (ref 38–126)
Anion gap: 11 (ref 5–15)
BUN: 12 mg/dL (ref 8–23)
CO2: 25 mmol/L (ref 22–32)
Calcium: 9.2 mg/dL (ref 8.9–10.3)
Chloride: 102 mmol/L (ref 98–111)
Creatinine: 0.85 mg/dL (ref 0.61–1.24)
GFR, Estimated: >60 mL/min (ref >60)
Glucose, Bld: 83 mg/dL (ref 70–99)
Potassium: 3.1 mmol/L — ABNORMAL LOW (ref 3.5–5.1)
Sodium: 138 mmol/L (ref 135–145)
Total Bilirubin: 0.7 mg/dL (ref 0.0–1.2)
Total Protein: 6.7 g/dL (ref 6.5–8.1)

## 2023-08-11 LAB — PROTIME-INR
INR: 1 (ref 0.8–1.2)
Prothrombin Time: 13.6 s (ref 11.4–15.2)

## 2023-08-11 LAB — APTT: aPTT: 23 s — ABNORMAL LOW (ref 24–36)

## 2023-08-11 LAB — CBC
HCT: 39.3 % (ref 39.0–52.0)
Hemoglobin: 12.3 g/dL — ABNORMAL LOW (ref 13.0–17.0)
MCH: 28 pg (ref 26.0–34.0)
MCHC: 31.3 g/dL (ref 30.0–36.0)
MCV: 89.5 fL (ref 80.0–100.0)
Platelets: 234 10*3/uL (ref 150–400)
RBC: 4.39 MIL/uL (ref 4.22–5.81)
RDW: 16.9 % — ABNORMAL HIGH (ref 11.5–15.5)
WBC: 6.5 10*3/uL (ref 4.0–10.5)
nRBC: 0 % (ref 0.0–0.2)

## 2023-08-11 LAB — MAGNESIUM: Magnesium: 2.1 mg/dL (ref 1.7–2.4)

## 2023-08-11 LAB — CBG MONITORING, ED: Glucose-Capillary: 73 mg/dL (ref 70–99)

## 2023-08-11 LAB — HEMOGLOBIN A1C
Hgb A1c MFr Bld: 5.7 % — ABNORMAL HIGH (ref 4.8–5.6)
Mean Plasma Glucose: 116.89 mg/dL

## 2023-08-11 LAB — ETHANOL: Alcohol, Ethyl (B): <10 mg/dL (ref <10)

## 2023-08-11 MED ORDER — IOHEXOL 350 MG/ML SOLN
75.0000 mL | Freq: Once | INTRAVENOUS | Status: AC | PRN
  Administered 2023-08-11: 75 mL via INTRAVENOUS

## 2023-08-11 MED ORDER — IPRATROPIUM BROMIDE 0.03 % NA SOLN
2.0000 | Freq: Two times a day (BID) | NASAL | Status: DC
  Filled 2023-08-11: qty 30

## 2023-08-11 MED ORDER — LOPERAMIDE HCL 2 MG PO CAPS: 2.0000 mg | ORAL_CAPSULE | ORAL | Status: DC | PRN

## 2023-08-11 MED ORDER — ASPIRIN 81 MG PO TBEC
81.0000 mg | DELAYED_RELEASE_TABLET | Freq: Every day | ORAL | Status: DC
  Administered 2023-08-11 – 2023-08-12 (×2): 81 mg via ORAL
  Filled 2023-08-11 (×2): qty 1

## 2023-08-11 MED ORDER — ACETAMINOPHEN 325 MG PO TABS: 650.0000 mg | ORAL_TABLET | Freq: Four times a day (QID) | ORAL | Status: DC | PRN

## 2023-08-11 MED ORDER — SENNOSIDES-DOCUSATE SODIUM 8.6-50 MG PO TABS: 1.0000 | ORAL_TABLET | Freq: Every evening | ORAL | Status: DC | PRN

## 2023-08-11 MED ORDER — ONDANSETRON 4 MG PO TBDP
4.0000 mg | ORAL_TABLET | Freq: Once | ORAL | Status: AC
  Administered 2023-08-11: 4 mg via ORAL
  Filled 2023-08-11: qty 1

## 2023-08-11 MED ORDER — ENOXAPARIN SODIUM 40 MG/0.4ML IJ SOSY
40.0000 mg | PREFILLED_SYRINGE | INTRAMUSCULAR | Status: DC
Start: 2023-08-11 — End: 2023-08-13
  Administered 2023-08-11: 40 mg via SUBCUTANEOUS
  Filled 2023-08-11: qty 0.4

## 2023-08-11 MED ORDER — STROKE: EARLY STAGES OF RECOVERY BOOK: Freq: Once | Status: DC

## 2023-08-11 MED ORDER — SODIUM CHLORIDE 0.9% FLUSH
3.0000 mL | Freq: Once | INTRAVENOUS | Status: AC
  Administered 2023-08-11: 3 mL via INTRAVENOUS

## 2023-08-11 MED ORDER — POTASSIUM CHLORIDE 20 MEQ PO PACK
40.0000 meq | PACK | Freq: Once | ORAL | Status: AC
Start: 2023-08-11 — End: 2023-08-11
  Administered 2023-08-11: 40 meq via ORAL
  Filled 2023-08-11: qty 2

## 2023-08-11 MED ORDER — SODIUM CHLORIDE 0.9% FLUSH
3.0000 mL | Freq: Two times a day (BID) | INTRAVENOUS | Status: DC
  Administered 2023-08-11 (×2): 10 mL via INTRAVENOUS
  Administered 2023-08-12: 3 mL via INTRAVENOUS

## 2023-08-11 MED ORDER — CLOPIDOGREL BISULFATE 75 MG PO TABS
75.0000 mg | ORAL_TABLET | Freq: Every day | ORAL | Status: DC
  Administered 2023-08-11 – 2023-08-12 (×2): 75 mg via ORAL
  Filled 2023-08-11 (×2): qty 1

## 2023-08-11 MED ORDER — LORAZEPAM 0.5 MG PO TABS: 0.5000 mg | ORAL_TABLET | Freq: Once | ORAL | Status: DC | PRN

## 2023-08-11 MED ORDER — LEVOTHYROXINE SODIUM 25 MCG PO TABS
125.0000 ug | ORAL_TABLET | Freq: Every day | ORAL | Status: DC
  Administered 2023-08-12: 125 ug via ORAL
  Filled 2023-08-11: qty 1

## 2023-08-11 MED ORDER — PANTOPRAZOLE SODIUM 40 MG PO TBEC
40.0000 mg | DELAYED_RELEASE_TABLET | Freq: Two times a day (BID) | ORAL | Status: DC
  Administered 2023-08-11 – 2023-08-12 (×2): 40 mg via ORAL
  Filled 2023-08-11 (×2): qty 1

## 2023-08-11 MED ORDER — SODIUM CHLORIDE 0.9% FLUSH: 3.0000 mL | INTRAVENOUS | Status: DC | PRN

## 2023-08-11 MED ORDER — POTASSIUM CHLORIDE CRYS ER 20 MEQ PO TBCR
40.0000 meq | EXTENDED_RELEASE_TABLET | Freq: Two times a day (BID) | ORAL | Status: DC
  Administered 2023-08-11 – 2023-08-12 (×2): 40 meq via ORAL
  Filled 2023-08-11 (×2): qty 2

## 2023-08-11 MED ORDER — PREDNISONE 20 MG PO TABS
30.0000 mg | ORAL_TABLET | Freq: Every day | ORAL | Status: DC
  Administered 2023-08-12: 30 mg via ORAL
  Filled 2023-08-11: qty 1

## 2023-08-11 MED ORDER — METOPROLOL TARTRATE 25 MG PO TABS
25.0000 mg | ORAL_TABLET | Freq: Two times a day (BID) | ORAL | Status: DC
  Administered 2023-08-11 – 2023-08-12 (×2): 25 mg via ORAL
  Filled 2023-08-11 (×2): qty 1

## 2023-08-11 MED ORDER — PROCHLORPERAZINE MALEATE 10 MG PO TABS: 10.0000 mg | ORAL_TABLET | Freq: Four times a day (QID) | ORAL | Status: DC | PRN

## 2023-08-11 MED ORDER — DRONABINOL 2.5 MG PO CAPS
5.0000 mg | ORAL_CAPSULE | Freq: Two times a day (BID) | ORAL | Status: DC
  Administered 2023-08-11 – 2023-08-12 (×2): 5 mg via ORAL
  Filled 2023-08-11 (×2): qty 2
  Filled 2023-08-11: qty 1

## 2023-08-11 MED ORDER — OLANZAPINE 5 MG PO TABS: 5.0000 mg | ORAL_TABLET | Freq: Every evening | ORAL | Status: DC | PRN

## 2023-08-11 NOTE — Assessment & Plan Note (Signed)
 Although his symptoms are transient and has mostly resolved. I recommend ER evaluation.  TIA/stroke vs seizure,etc

## 2023-08-11 NOTE — ED Notes (Signed)
 Primary RN made aware that pt will be roomed after CT.

## 2023-08-11 NOTE — Assessment & Plan Note (Signed)
Off Eliquis 2.5mg  BID due to GI bleeding

## 2023-08-11 NOTE — H&P (Signed)
 History and Physical    Jonathan [name] FMW:[id number] DOB: 08/14/1955 DOA: 08/11/2023  PCP: Valora Agent, MD (Confirm with patient/family/NH records and if not entered, this has to be entered at [facility] point of entry) Patient coming from: Home  I have personally briefly reviewed patient's old medical records in [facility]  Chief Complaint: Left arm weakness  HPI: Jonathan [name] is a 68 y.o. 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  Patient has been on chemotherapy since June last year, last month so formula is changed to FOLFIRI every 2 weeks and patient completed the second cycle 2 weeks ago.  Today patient went back to oncology orthopedic to have a third cycle of chemo.  At around 8:08, when patient described that the nurse started drew blood from his chest port, patient started to feel numbness and tingling sensation of bilateral fingertips and the right side of face then he found that he lost muscle strength on left arm temporarily as his arm dropped to the bed and with weak grip. Meantime, oncology staff noticed patient had right-sided facial droop and slurred speech.  Oncology initially sent patient to ED for evaluation of stroke.  Symptoms however resolved before patient arrived in the ED.   ED Course: Afebrile, nontachycardic nonhypotensive nonhypoxic.  CT head without contrast showed no acute intracranial abnormalities.  Jk2.9, hemoglobin 12.3, WBC 6.5.  Review of Systems: As per HPI otherwise 14 point review of systems negative.    Past Medical History:  Diagnosis Date   Arthritis    Complication of anesthesia    OCCURRED ONCE YEARS AGO 1981   Deep venous thrombosis (HCC) 10/28/2022   Esophageal mass    Essential hypertension 05/01/2023   Hypertension     Hypothyroidism    PONV (postoperative nausea and vomiting)     Past Surgical History:  Procedure Laterality Date   COLONOSCOPY     ESOPHAGOGASTRODUODENOSCOPY N/A 08/23/2022   Procedure: ESOPHAGOGASTRODUODENOSCOPY (EGD);  Surgeon: Onita Elspeth Sharper, DO;  Location: [facility] ENDOSCOPY;  Service: Gastroenterology;  Laterality: N/A;   ESOPHAGOGASTRODUODENOSCOPY (EGD) WITH PROPOFOL  N/A 05/05/2023   Procedure: ESOPHAGOGASTRODUODENOSCOPY (EGD) WITH PROPOFOL ;  Surgeon: Onita Elspeth Sharper, DO;  Location: [facility] ENDOSCOPY;  Service: Gastroenterology;  Laterality: N/A;   EUS N/A 09/12/2022   Procedure: FULL UPPER ENDOSCOPIC ULTRASOUND (EUS) RADIAL;  Surgeon: Queenie Asberry LABOR, MD;  Location: [facility] ENDOSCOPY;  Service: Gastroenterology;  Laterality: N/A;   FINGER SURGERY     HEMOSTASIS CONTROL  05/05/2023   Procedure: HEMOSTASIS CONTROL;  Surgeon: Onita Elspeth Sharper, DO;  Location: [facility] ENDOSCOPY;  Service: Gastroenterology;;   PORTA CATH INSERTION N/A 10/17/2022   Procedure: PORTA CATH INSERTION;  Surgeon: Marea Selinda RAMAN, MD;  Location: [facility] INVASIVE CV LAB;  Service: Cardiovascular;  Laterality: N/A;   TENDON REPAIR IN LEFT KNEE       reports that he has never smoked. He has never used smokeless tobacco. He reports current alcohol use. He reports that he does not use drugs.  No Known Allergies  Family History  Problem Relation Age of Onset   Heart attack Mother    Prostate cancer Father    Breast cancer Sister    Prostate cancer Paternal Uncle      Prior to Admission medications   Medication Sig Start Date End Date Taking? Authorizing Provider  acetaminophen  (TYLENOL ) 650 MG CR tablet Take 1,300 mg by mouth every 8 (eight) hours as needed for pain.   Yes [provider]  azelastine  (ASTELIN ) 0.1 % nasal spray Place 1 spray into both nostrils 2 (two) times daily. Use in each nostril as directed 02/12/23  Yes Babara Call, MD  dronabinol  (MARINOL ) 5 MG capsule Take 1 capsule (5 mg  total) by mouth 2 (two) times daily before lunch and supper. 08/05/23  Yes Babara Call, MD  gentamicin  ointment (GARAMYCIN ) 0.1 % Apply 1 Application topically 2 (two) times daily as needed. 07/02/23  Yes [provider]  ipratropium (ATROVENT ) 0.03 % nasal spray Place 2 sprays into both nostrils 2 (two) times daily. 03/24/23  Yes [provider]  ketoconazole (NIZORAL) 2 % shampoo Apply 1 Application topically 2 (two) times a week. 03/24/23  Yes [provider]  levothyroxine  (SYNTHROID ) 125 MCG tablet Take 125 mcg by mouth every morning. 12/20/22  Yes [provider]  loperamide  (IMODIUM ) 2 MG capsule Take 2 tabs by mouth with first loose stool, then 1 tab with each additional loose stool as needed. Do not exceed 8 tabs in a 24-hour period 06/20/23  Yes Babara Call, MD  magic mouthwash (multi-ingredient) oral suspension Swish and spit 5-10 mLs by mouth 4 (four) times daily as needed. 06/30/23  Yes Babara Call, MD  metoprolol  tartrate (LOPRESSOR ) 25 MG tablet Take 1 tablet (25 mg total) by mouth 2 (two) times daily. 07/25/23 10/23/23 Yes Madireddy, Alean SAUNDERS, MD  OLANZapine  (ZYPREXA ) 10 MG tablet Take 0.5-1 tablets (5-10 mg total) by mouth at bedtime as needed (nausea). 07/08/23  Yes Borders, Fonda SAUNDERS, NP  pantoprazole  (PROTONIX ) 40 MG tablet Take 1 tablet (40 mg total) by mouth 2 (two) times daily. 07/29/23  Yes Babara Call, MD  potassium chloride  SA (KLOR-CON  M) 20 MEQ tablet Take 2 tablets (40 mEq total) by mouth 2 (two) times daily. 06/05/23  Yes Babara Call, MD  predniSONE  (DELTASONE ) 10 MG tablet Take 3 tablets (30 mg total) by mouth daily with breakfast. 08/01/23  Yes Babara Call, MD  prochlorperazine  (COMPAZINE ) 10 MG tablet Take 10 mg by mouth every 6 (six) hours as needed. 07/10/23  Yes [provider]  chlorhexidine  (PERIDEX ) 0.12 % solution USE AS DIRECTED TAKE  15  ML  IN  THE  MOUTH  OR  THROAT  TWICE  DAILY 04/15/23   Babara Call, MD  ondansetron  (ZOFRAN ) 8 MG tablet Take 1  tablet (8 mg total) by mouth every 8 (eight) hours as needed for nausea or vomiting. Start on the third day after chemotherapy. Patient not taking: Reported on 08/11/2023 06/19/23   Babara Call, MD  predniSONE  (DELTASONE ) 20 MG tablet Take 2 tablets (40 mg total) by mouth daily with breakfast. Patient not taking: Reported on 08/11/2023 06/19/23   Babara Call, MD    Physical Exam: Vitals:   08/11/23 1130 08/11/23 1200 08/11/23 1400 08/11/23 1401  BP: 121/77 109/77 126/88   Pulse: 86 86 98 97  Resp:      Temp:      SpO2: 97% 99% 97% 97%  Weight:      Height:        Constitutional: NAD, calm, comfortable Vitals:   08/11/23 1130 08/11/23 1200 08/11/23 1400 08/11/23 1401  BP: 121/77 109/77 126/88   Pulse: 86 86 98 97  Resp:      Temp:      SpO2: 97% 99% 97% 97%  Weight:  Height:       Eyes: PERRL, lids and conjunctivae normal ENMT: Mucous membranes are moist. Posterior pharynx clear of any exudate or lesions.Normal dentition.  Neck: normal, supple, no masses, no thyromegaly Respiratory: clear to auscultation bilaterally, no wheezing, no crackles. Normal respiratory effort. No accessory muscle use.  Cardiovascular: Regular rate and rhythm, no murmurs / rubs / gallops. No extremity edema. 2+ pedal pulses. No carotid bruits.  Abdomen: no tenderness, no masses palpated. No hepatosplenomegaly. Bowel sounds positive.  Musculoskeletal: no clubbing / cyanosis. No joint deformity upper and lower extremities. Good ROM, no contractures. Normal muscle tone.  Skin: no rashes, lesions, ulcers. No induration Neurologic: CN 2-12 grossly intact. Sensation intact, DTR normal. Strength 5/5 in all 4.  No upper extremity drifting Psychiatric: Normal judgment and insight. Alert and oriented x 3. Normal mood.     Labs on Admission: I have personally reviewed following labs and imaging studies  CBC: Recent Labs  Lab 08/05/23 0834 08/11/23 0806 08/11/23 0835  WBC 7.2 6.9 6.5  NEUTROABS 6.1 5.6 5.4   HGB 12.5* 12.3* 12.3*  HCT 40.4 39.1 39.3  MCV 88.6 87.3 89.5  PLT 163 229 234   Basic Metabolic Panel: Recent Labs  Lab 08/05/23 0834 08/11/23 0806 08/11/23 0835  NA 141 138 137  K 3.6 3.1* 2.9*  CL 104 102 102  CO2 25 25 22   GLUCOSE 101* 83 84  BUN 15 12 13   CREATININE 0.84 0.85 0.87  CALCIUM  9.1 9.2 9.0   GFR: Estimated Creatinine Clearance: 83.5 mL/min (by C-G formula based on SCr of 0.87 mg/dL). Liver Function Tests: Recent Labs  Lab 08/05/23 0834 08/11/23 0806 08/11/23 0835  AST 21 23 17   ALT 25 21 22   ALKPHOS 88 90 89  BILITOT 1.0 0.7 0.9  PROT 6.9 6.7 6.8  ALBUMIN 3.4* 3.4* 3.5   No results for input(s): LIPASE, AMYLASE in the last 168 hours. No results for input(s): AMMONIA in the last 168 hours. Coagulation Profile: Recent Labs  Lab 08/11/23 0835  INR 1.0   Cardiac Enzymes: No results for input(s): CKTOTAL, CKMB, CKMBINDEX, TROPONINI in the last 168 hours. BNP (last 3 results) No results for input(s): PROBNP in the last 8760 hours. HbA1C: No results for input(s): HGBA1C in the last 72 hours. CBG: Recent Labs  Lab 08/11/23 0833  GLUCAP 73   Lipid Profile: No results for input(s): CHOL, HDL, LDLCALC, TRIG, CHOLHDL, LDLDIRECT in the last 72 hours. Thyroid  Function Tests: No results for input(s): TSH, T4TOTAL, FREET4, T3FREE, THYROIDAB in the last 72 hours. Anemia Panel: No results for input(s): VITAMINB12, FOLATE, FERRITIN, TIBC, IRON , RETICCTPCT in the last 72 hours. Urine analysis:    Component Value Date/Time   COLORURINE AMBER (A) 06/19/2023 1133   APPEARANCEUR CLOUDY (A) 06/19/2023 1133   LABSPEC 1.021 06/19/2023 1133   PHURINE 7.0 06/19/2023 1133   GLUCOSEU NEGATIVE 06/19/2023 1133   HGBUR LARGE (A) 06/19/2023 1133   BILIRUBINUR NEGATIVE 06/19/2023 1133   KETONESUR NEGATIVE 06/19/2023 1133   PROTEINUR 100 (A) 06/19/2023 1133   NITRITE NEGATIVE 06/19/2023 1133   LEUKOCYTESUR  NEGATIVE 06/19/2023 1133    Radiological Exams on Admission: CT HEAD WO CONTRAST Result Date: 08/11/2023 CLINICAL DATA:  Provided history: Neuro deficit, acute, stroke suspected. Slurred speech, left-sided facial droop, left arm weakness. Patient reports deficits have now resolved. EXAM: CT HEAD WITHOUT CONTRAST TECHNIQUE: Contiguous axial images were obtained from the base of the skull through the vertex without intravenous contrast. RADIATION DOSE REDUCTION: This  exam was performed according to the departmental dose-optimization program which includes automated exposure control, adjustment of the mA and/or kV according to patient size and/or use of iterative reconstruction technique. COMPARISON:  Brain MRI 06/04/2023. FINDINGS: Brain: Mild generalized parenchymal atrophy. There is no acute intracranial hemorrhage. No demarcated cortical infarct. No extra-axial fluid collection. No evidence of an intracranial mass. No midline shift. Vascular: No hyperdense vessel.  Atherosclerotic calcifications. Skull: No calvarial fracture or aggressive osseous lesion. Sinuses/Orbits: No mass or acute finding within the imaged orbits. No significant paranasal sinus disease at the imaged levels. Other: Trace fluid within the ethmoid air cells. IMPRESSION: 1. No evidence of an acute intracranial abnormality. 2. Mild generalized parenchymal atrophy. Electronically Signed   By: Rockey Childs D.O.   On: 08/11/2023 09:04    EKG: Independently reviewed.  Sinus rhythm, no acute ST T changes.  Assessment/Plan Principal Problem:   TIA (transient ischemic attack)  (please populate well all problems here in Problem List. (For example, if patient is on BP meds at home and you resume or decide to hold them, it is a problem that needs to be her. Same for CAD, COPD, HLD and so on)  TIA -Ordered brain MRI -CTA head and neck -Start aspirin  Plavix  dual antiplatelet for 3 weeks and then aspirin  alone therapy -Check A1c and  statin -Allow permissive hypertension retinyl patient blood pressure is within normal limits -Speech, PT, OT -Tele monitoring x 24 hours -Echocardiogram  Hypokalemia -Acute on chronic, p.o. replacement and resume home KCl supplement -Recheck K level tomorrow.  Magnesium level pending  Hypothyroidism -Continue Synthroid   HTN -On low-dose of metoprolol , hold off other BP meds  Stage IV esophageal adenocarcinoma -Outpatient follow-up with oncology for chemotherapy -Supportive care including chronic prednisone , nauseous/vomiting medications   DVT prophylaxis: Lovenox  Code Status: Full code Family Communication: None at bedside Disposition Plan: Expect less than 2 midnight hospital stay Consults called: None Admission status: Tele obs   Cort ONEIDA Mana MD [facility] Pager [phone]  08/11/2023, 2:45 PM

## 2023-08-11 NOTE — ED Notes (Signed)
 To MRI

## 2023-08-11 NOTE — Progress Notes (Signed)
 [id number]- while accessing port- pt began with staring off, slurred speech and complete left arm weakness and noted left side facial drop. Within a minute he returned to baseline and answered all questions correctly. Pt admits to feeling off and that unable to control left arm.  VSS.   Dr Babara made aware and at chairside. Pt taken to ED and report given.   ED staff aware that Pac is accessed

## 2023-08-11 NOTE — Progress Notes (Signed)
 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 KRAS G12V, TMB 5.3, MSI Stable.  CPS 65%  1st line  FOLFOX , palliative RT --> 12/2022 CT progression--> switch to 5-FU + Nivolumab  Q2 weeks  --> 02/2023 PET progression- pneumonitis--> 03/27/23  3rd line  Taxol  and Cyramza  --> 05/02/23 CT during admission showed slight decrease of liver lesion size. --> 10/30 PET obtained by radonc -liver lesion 10cm, bone mets. --> 4th line FOLFIRI Refer to [facility] for expert opinion. He declined.  GI bleeding due to esophageal cancer, s/p  palliative RT Labs are reviewed and discussed with patient. S/p cycle 2  FOLFIRI -irinotecan  dose reduction to 100mg /m2  Hold off chemotherapy due to acute neurological symptoms. I recommend patient to go to ER for CVA work up. Other differential includes seizure   Transient neurological symptoms Although his symptoms are transient and has mostly resolved. I recommend ER evaluation.  TIA/stroke vs seizure,etc    Deep venous thrombosis (HCC) Off Eliquis  2.5mg  BID due to GI bleeding    No orders of the defined types were placed in this encounter.   Follow-up TBD  All questions were answered. The patient knows to call the clinic with any problems, questions or concerns.  Call Babara, MD, PhD [facility] Hematology Oncology 08/11/2023   HISTORY OF PRESENTING ILLNESS:  Jonathan [name] 68 y.o. male presents to establish care for esophageal adenocarcinoma I have reviewed his chart and materials related to his  cancer extensively and collaborated history with the patient. Summary of oncologic history is as follows: Oncology History  Adenocarcinoma of esophagus (HCC)  08/28/2022 Initial Diagnosis   Adenocarcinoma of esophagus   -Patient has noticed worsening of food stuck/fullness sensation since November 2023.  Patient had a barium swallow study which commented on marked mucosal irregularity in the distal esophagus with Broaddus base mural filling defect highly suspicious for malignancy.  Patient establish care with gastroenterology. -08/23/2022, EGD showed gastritis and partially obstructing malignant esophageal tumor in the lower third of the esophagus. Esophagus mass biopsy showed adenocarcinoma.  PD-L1 TPS 65%, HER2 negative.  [facility] NGS showed KRASG12V, CDKN2A, ARID1A, TP53, TMB 5.3, MSI stable.  Stomach biopsy showed gastric mucosa with no specific histology abnormality.  No significant intestinal metaplastic, dysplastic, granular atrophy or increased inflammation.     08/28/2022 Imaging   CT chest abdomen pelvis with contrast showed 1. Distal esophageal primary with gastrohepatic ligament nodal metastasis. 2. 2 right-sided pulmonary nodules, the largest of which measures 5 mm and is new since 2015. Pulmonary metastasis not be excluded. 3. Anterior right lower lobe volume loss and minimal soft tissue density, favoring atelectasis or scar. Recommend attention on follow-up. 4. Hepatic steatosis 5. Cholelithiasis 6. Left nephrolithiasis 7. Coronary artery atherosclerosis. Aortic Atherosclerosis   08/28/2022 Cancer Staging   Staging form: Esophagus - Adenocarcinoma, AJCC 8th Edition - Clinical stage from 08/28/2022: Stage IVB (cT3, cN1, cM1) - Signed by Babara Call, MD on 10/04/2022 Stage prefix: Initial diagnosis   09/06/2022 Imaging   PET scan showed 1. Esophageal primary with gastrohepatic ligament nodal metastasis,as on CT. 2. Focus of hypermetabolism which  is favored to registered to  the posterior hepatic dome, in the region of subtle heterogeneity on prior diagnostic CT. Suboptimally evaluated secondary to underlying steatosis. Recommend further evaluation with pre and post contrast abdominal MRI to confirm probable metastasis. 3. Incidental findings, including: Left nephrolithiasis.Cholelithiasis. Coronary artery atherosclerosis. Aortic Atherosclerosis    09/23/2022 Imaging   MRI abdomen with and without contrast showed 1. Mildly T2 hyperintense segment VII hepatic lesion measuring 2.7 cm with imaging characteristics compatible with metastatic disease. 2. Tiny focus of delayed enhancement in the inferior right lobe of the liver segment VI measuring 8 mm with ill-defined increased T2 signal and subtle corresponding reduced diffusivity, also suspicious for metastatic disease. 3. Partially visualized distal esophageal wall thickening compatible with the patient's known primary neoplasm. 4. Similar size of the 11 mm gastrohepatic ligament lymph node mildly metabolic on prior PET-CT and compatible with local nodal disease involvement. 5. 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 Recommend follow up pre and post-contrast MRI/MRCP in 1 year. 6. Diffuse hepatic steatosis.   10/10/2022 Procedure   LIVER MASS; CT-GUIDED BIOPSY:  - MODERATE TO POORLY DIFFERENTIATED ADENOCARCINOMA MORPHOLOGICALLY  CONSISTENT WITH METASTASIS FROM PATIENT'S KNOWN ESOPHAGEAL  ADENOCARCINOMA.    10/17/2022 Procedure   Medi port placed by Dr. Marea   10/21/2022 - 01/15/2023 Chemotherapy   Patient is on Treatment Plan : ESOPHAGEAL ADENOCARCINOMA FOLFOX q14d x 6 cycles     01/15/2023 Imaging   PET showed  1. Interval progression in metastatic esophageal carcinoma as evidenced by hypermetabolic lymph nodes in the neck, chest, abdomen and pelvis, an enlarging right hepatic lobe metastasis, new bilateral adrenal  metastases and new osseous metastases. 2. Cholelithiasis. 3. Left renal stones. 4. Aortic atherosclerosis (ICD10-I70.0). Coronary artery calcification.    01/27/2023 - 03/14/2023 Chemotherapy   Patient is on Treatment Plan : GASTROESOPHAGEAL FOLFOX + Nivolumab  q14d     03/20/2023 Imaging   CT chest abdomen pelvis w contrast showed 1. Interval progression of metastatic disease with multiple new and enlarged hypodense lesions throughout the liver. 2. Significant enlargement of bilateral adrenal metastases. 3. Subtle sclerosis of an osseous metastasis of the right femoral neck. Other previously FDG avid osseous metastases of the left femoral neck and right sixth rib not appreciated by CT. 4. No persistently enlarged lymph nodes. 5. Interval development of bandlike consolidation and fibrosis of the paramedian right lung, particularly of the right lower lobe, as well as additional scattered irregular opacities throughout the right lung. 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 6. Similar circumferential wall thickening throughout the mid to lower esophagus, consistent with known primary esophageal adenocarcinoma. 7. Nonobstructive bilateral nephrolithiasis. Aortic Atherosclerosis   03/27/2023 - 06/19/2023 Chemotherapy   Patient is on Treatment Plan : GASTROESOPHAGEAL Ramucirumab  D1, 15 + Paclitaxel  D1,8,15 q28d     05/02/2023 - 05/08/2023 Hospital Admission   Admission due to coffee-ground emesis. EGD 10/7 showed bleeding from esophageal cancer, esophagitis due to chemo and radiation. Applied Hemospray. Eliquis  was stopped. Acute blood loss anemia, received IV venofer  treatments x 3 doses    05/28/2023 Imaging   1. Progressive hypermetabolism in the distal esophagus consistent with progressive disease. 2. Significant progression of metastatic hepatic disease (new and enlarging lesions). 3. Enlarging and progressively hypermetabolic right adrenal gland  mass. 4. New hypermetabolic bone lesions in the T12 and L2 vertebral bodies, left sacrum and right sixth posterior rib consistent with metastatic disease. 5.  Possible hypermetabolic brain lesions. MRI suggested for further evaluation. 6. Chronic medial right lower lobe atelectasis likely radiation change. Moderate hypermetabolism but no obvious tumor. 7. Stable gallstones, renal calculi and vascular calcifications.   06/19/2023 Imaging   CT chest angiogram w contrast, CT abdomen pelvis w contrast  1. Negative for pulmonary embolus. 2. Persistent lower esophageal wall thickening with progressive hepatic and right adrenal metastatic disease, compatible with stage IV esophageal carcinoma, as on PET 05/21/2023. 3. 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  4. 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 5. 4.0 cm ascending aortic aneurysm. Recommend annual imaging follow up by CTA or MRA. This recommendation follows 2010 ACCF/AHA/AATS/ACR/ASA/SCA/SCAI/SIR/STS/SVM Guidelines for the Diagnosis and Management of Patients with Thoracic Aortic Disease.Circulation. 2010; 121: [id number]. Aortic aneurysm NOS (ICD10-I71.9). 6. Cholelithiasis. 7. Small left renal stone. 8. Aortic atherosclerosis (ICD10-I70.0). Left and descending coronary artery calcification.   06/30/2023 -  Chemotherapy   Patient is on Treatment Plan : COLORECTAL FOLFIRI q14d     Patient presents to establish care.  He is not taking PPI. He has intentionally lost some weight. Family history positive for father and paternal uncle with prostate cancer and sister with breast cancer. Denies any routine alcohol use  Right interval jugular vein occlusive DVT, started on Eliquis  starter package on 10/28/2022, swelling of neck has improved.   CXR showed  Interval development of bilateral perihilar interstitial opacities  He was treated with Azithromycin  and Doxycycline  to cover atypical infections.   INTERVAL HISTORY Jonathan [name] is a 68 y.o. male who has above history reviewed by me today presents for follow up visit for Stage IV esophageal adenocarcinoma.   In the infusion center, RN found patient suddenly spaced out, slurred speech, with left upper extremity drifted down/ weakness. RN also reported left facial droop.  No shaking of extremities.  Symptom resolved spontaneously after couple minutes. I was called to evaluate patient and patient's symptoms have mostly resolved with only very mild left facial droop.  He denies SOB, chest pain, headache, nausea vomiting.       MEDICAL HISTORY:  Past Medical History:  Diagnosis Date   Arthritis    Complication of anesthesia    OCCURRED ONCE YEARS AGO 1981   Deep venous thrombosis (HCC) 10/28/2022   Esophageal mass    Essential hypertension 05/01/2023   Hypertension    Hypothyroidism    PONV (postoperative nausea and vomiting)     SURGICAL HISTORY: Past Surgical History:  Procedure Laterality Date   COLONOSCOPY     ESOPHAGOGASTRODUODENOSCOPY N/A 08/23/2022   Procedure: ESOPHAGOGASTRODUODENOSCOPY (EGD);  Surgeon: Onita Elspeth Sharper, DO;  Location: [facility] ENDOSCOPY;  Service: Gastroenterology;  Laterality: N/A;   ESOPHAGOGASTRODUODENOSCOPY (EGD) WITH PROPOFOL  N/A 05/05/2023   Procedure: ESOPHAGOGASTRODUODENOSCOPY (EGD) WITH PROPOFOL ;  Surgeon: Onita Elspeth Sharper, DO;  Location: [facility] ENDOSCOPY;  Service: Gastroenterology;  Laterality: N/A;   EUS N/A 09/12/2022   Procedure: FULL UPPER ENDOSCOPIC ULTRASOUND (EUS) RADIAL;  Surgeon: Queenie Asberry LABOR, MD;  Location: [facility] ENDOSCOPY;  Service: Gastroenterology;  Laterality: N/A;   FINGER SURGERY     HEMOSTASIS CONTROL  05/05/2023   Procedure: HEMOSTASIS CONTROL;  Surgeon: Onita Elspeth Sharper, DO;  Location: [facility] ENDOSCOPY;   Service: Gastroenterology;;   PORTA CATH INSERTION N/A 10/17/2022   Procedure: PORTA CATH INSERTION;  Surgeon: Marea Selinda RAMAN, MD;  Location: [facility] INVASIVE CV LAB;  Service: Cardiovascular;  Laterality: N/A;   TENDON REPAIR IN LEFT KNEE      SOCIAL HISTORY: Social History   Socioeconomic History   Marital status: Single    Spouse name: Not on file   Number of children: Not on file   Years of education: Not on file   Highest education level: Not on file  Occupational History   Not on file  Tobacco Use   Smoking status: Never   Smokeless tobacco: Never  Vaping Use   Vaping status: Never Used  Substance and Sexual Activity   Alcohol use: Yes    Comment: OCCASIONALLY   Drug use: Never   Sexual activity: Not on file  Other Topics Concern   Not on file  Social History Narrative   Not on file   Social Drivers of Health   Financial Resource Strain: Low Risk  (10/29/2022)   Received from [facility], [facility]   Overall Financial Resource Strain (CARDIA)    Difficulty of Paying Living Expenses: Not hard at all  Food Insecurity: No Food Insecurity (05/30/2023)   Hunger Vital Sign    Worried About Running Out of Food in the Last Year: Never true    Ran Out of Food in the Last Year: Never true  Transportation Needs: No Transportation Needs (05/30/2023)   PRAPARE - Administrator, Civil Service (Medical): No    Lack of Transportation (Non-Medical): No  Physical Activity: Not on file  Stress: Not on file  Social Connections: Not on file  Intimate Partner Violence: Not At Risk (05/30/2023)   Humiliation, Afraid, Rape, and Kick questionnaire    Fear of Current or Ex-Partner: No    Emotionally Abused: No    Physically Abused: No    Sexually Abused: No    FAMILY HISTORY: Family History  Problem Relation Age of Onset   Heart attack Mother    Prostate cancer Father    Breast cancer Sister    Prostate cancer Paternal Uncle      ALLERGIES:  has no known allergies.  MEDICATIONS:  Current Outpatient Medications  Medication Sig Dispense Refill   acetaminophen  (TYLENOL ) 650 MG CR tablet Take 1,300 mg by mouth every 8 (eight) hours as needed for pain.     azelastine  (ASTELIN ) 0.1 % nasal spray Place 1 spray into both nostrils 2 (two) times daily. Use in each nostril as directed 30 mL 1   chlorhexidine  (PERIDEX ) 0.12 % solution USE AS DIRECTED TAKE  15  ML  IN  THE  MOUTH  OR  THROAT  TWICE  DAILY 473 mL 0   dronabinol  (MARINOL ) 5 MG capsule Take 1 capsule (5 mg total) by mouth 2 (two) times daily before lunch and supper. 60 capsule 0   gentamicin  ointment (GARAMYCIN ) 0.1 % Apply 1 Application topically 2 (two) times daily as needed.     levothyroxine  (SYNTHROID ) 125 MCG tablet Take 125 mcg by mouth every morning.     loperamide  (IMODIUM ) 2 MG capsule Take 2 tabs by mouth with first loose stool, then 1 tab with each additional loose stool as needed. Do not exceed 8 tabs in a 24-hour period 90 capsule 0   magic mouthwash (multi-ingredient) oral suspension Swish and spit 5-10 mLs by mouth 4 (four) times daily as needed. 480 mL 1   metoprolol  tartrate (LOPRESSOR ) 25 MG tablet Take 1 tablet (25 mg total) by mouth 2 (two) times daily. 180 tablet 3   OLANZapine  (ZYPREXA ) 10 MG  tablet Take 0.5-1 tablets (5-10 mg total) by mouth at bedtime as needed (nausea). 30 tablet 3   ondansetron  (ZOFRAN ) 8 MG tablet Take 1 tablet (8 mg total) by mouth every 8 (eight) hours as needed for nausea or vomiting. Start on the third day after chemotherapy. 90 tablet 1   pantoprazole  (PROTONIX ) 40 MG tablet Take 1 tablet (40 mg total) by mouth 2 (two) times daily. 60 tablet 3   potassium chloride  SA (KLOR-CON  M) 20 MEQ tablet Take 2 tablets (40 mEq total) by mouth 2 (two) times daily. 60 tablet 1   predniSONE  (DELTASONE ) 10 MG tablet Take 3 tablets (30 mg total) by mouth daily with breakfast. 90 tablet 0   predniSONE  (DELTASONE ) 20 MG tablet Take 2  tablets (40 mg total) by mouth daily with breakfast. 60 tablet 0   prochlorperazine  (COMPAZINE ) 10 MG tablet Take 10 mg by mouth every 6 (six) hours as needed.     No current facility-administered medications for this visit.    Review of Systems  Constitutional:  Positive for appetite change and fatigue. Negative for chills and fever.  HENT:   Negative for hearing loss and voice change.        Nasal congestion, postnasal drip.   Eyes:  Negative for eye problems and icterus.  Respiratory:  Negative for chest tightness, cough and shortness of breath.   Cardiovascular:  Negative for chest pain and leg swelling.  Gastrointestinal:  Positive for nausea. Negative for abdominal distention, abdominal pain and vomiting.  Endocrine: Negative for hot flashes.  Genitourinary:  Negative for difficulty urinating, dysuria and frequency.   Musculoskeletal:  Negative for arthralgias.  Skin:  Negative for itching and rash.  Neurological:  Positive for extremity weakness and numbness. Negative for light-headedness.  Hematological:  Negative for adenopathy. Does not bruise/bleed easily.  Psychiatric/Behavioral:  Negative for confusion.   See interval history.    PHYSICAL EXAMINATION: ECOG PERFORMANCE STATUS: 1 - Symptomatic but completely ambulatory There were no vitals filed for this visit. There were no vitals filed for this visit.   Physical Exam Constitutional:      General: He is not in acute distress.    Appearance: He is obese. He is not diaphoretic.  HENT:     Head: Normocephalic and atraumatic.  Eyes:     General: No scleral icterus. Cardiovascular:     Rate and Rhythm: Normal rate and regular rhythm.  Pulmonary:     Effort: Pulmonary effort is normal. No respiratory distress.     Breath sounds: No wheezing.  Abdominal:     General: There is no distension.     Palpations: Abdomen is soft.  Musculoskeletal:        General: Normal range of motion.     Cervical back: Normal range of  motion and neck supple.  Skin:    General: Skin is warm and dry.     Findings: No erythema.  Neurological:     Mental Status: He is alert and oriented to person, place, and time. Mental status is at baseline.     Motor: No abnormal muscle tone.     Comments: No focal deficit except minimal left facial droop  Psychiatric:        Mood and Affect: Mood and affect normal.      LABORATORY DATA:  I have reviewed the data as listed    Latest Ref Rng & Units 08/05/2023    8:34 AM 07/29/2023    7:58 AM 07/14/2023  10:12 AM  CBC  WBC 4.0 - 10.5 K/uL 7.2  15.1  4.7   Hemoglobin 13.0 - 17.0 g/dL 87.4  87.7  87.6   Hematocrit 39.0 - 52.0 % 40.4  39.4  39.6   Platelets 150 - 400 K/uL 163  219  236       Latest Ref Rng & Units 08/05/2023    8:34 AM 07/29/2023    7:58 AM 07/14/2023   10:12 AM  CMP  Glucose 70 - 99 mg/dL 898  884  893   BUN 8 - 23 mg/dL 15  10  15    Creatinine 0.61 - 1.24 mg/dL 9.15  9.19  8.88   Sodium 135 - 145 mmol/L 141  138  141   Potassium 3.5 - 5.1 mmol/L 3.6  3.4  3.4   Chloride 98 - 111 mmol/L 104  103  103   CO2 22 - 32 mmol/L 25  24  24    Calcium  8.9 - 10.3 mg/dL 9.1  9.1  9.7   Total Protein 6.5 - 8.1 g/dL 6.9  6.8  7.3   Total Bilirubin 0.0 - 1.2 mg/dL 1.0  0.6  0.9   Alkaline Phos 38 - 126 U/L 88  74  56   AST 15 - 41 U/L 21  24  24    ALT 0 - 44 U/L 25  23  21       RADIOGRAPHIC STUDIES: I have personally reviewed the radiological images as listed and agreed with the findings in the report. MR Lumbar Spine W Wo Contrast Result Date: 07/29/2023 CLINICAL DATA:  Esophageal cancer being treated with chemotherapy. New weakness in both legs. EXAM: MRI THORACIC AND LUMBAR SPINE WITHOUT AND WITH CONTRAST TECHNIQUE: Multiplanar and multiecho pulse sequences of the thoracic and lumbar spine were obtained without and with intravenous contrast. CONTRAST:  7.5mL GADAVIST  GADOBUTROL  1 MMOL/ML IV SOLN COMPARISON:  Head CT 05/21/2023 and body CT 06/19/2023 FINDINGS: MRI  THORACIC SPINE FINDINGS Alignment:  Mild degenerative anterolisthesis at T2-3. Vertebrae: Accentuated fatty marrow at T4-T10, likely related to esophageal radiation. Small lesion in the T12 body, compatible with metastatic disease when compared to prior PET-CT. No underlying fracture. Small lesions in the T9 and T10 vertebral body could be additional metastatic deposits. No extraosseous tumor extension. Cord:  Normal signal and morphology. Paraspinal and other soft tissues: Known liver and right adrenal masses. Disc levels: Generalized disc narrowing and desiccation with endplate and facet spurring. MRI LUMBAR SPINE FINDINGS Segmentation:  Standard. Alignment:  Slight L4-5 anterolisthesis.  Dextroscoliosis. Vertebrae: Infiltrative bone marrow signal abnormality at L2, correlating with PET CT. No extraosseous tumor or insufficiency fracture. No incidental bone infection. Conus medullaris: Extends to the T12-L1 level and appears normal. Normal appearance of the cauda equina . Paraspinal and other soft tissues: Known from visceral imaging. Disc levels: T12- L1: Spondylosis.  No neural impingement L1-L2: Disc height loss and spondylosis.  No neural impingement L2-L3: Disc narrowing and bulging with biforaminal protrusion. Mild facet spurring. L3-L4: Disc narrowing and circumferential bulging with central protrusion. Degenerative facet spurring on both sides. Mild triangular narrowing of the thecal sac. L4-L5: Degenerative facet spurring which is bulky. Mild anterolisthesis. The disc is narrowed and bulging with moderate left subarticular recess stenosis. The foramina are patent L5-S1:Disc narrowing and bulging with right inferior foraminal protrusion. Degenerative facet spurring on both sides. Mild right subarticular recess and foraminal narrowing. IMPRESSION: Thoracic spine: Limited metastatic disease seen at the T9, T10, and T12 bodies. No extraosseous tumor  or cord signal abnormality to correlate with weakness.  Lumbar spine: 1. L2 vertebral body metastasis, known from prior PET-CT. No extra-osseous extension, fracture, or other explanation for weakness. 2. Generalized lumbar spine degeneration as described. Diffusely patent spinal canal. Electronically Signed   By: Dorn Roulette M.D.   On: 07/29/2023 08:57   MR Thoracic Spine W Wo Contrast Result Date: 07/29/2023 CLINICAL DATA:  Esophageal cancer being treated with chemotherapy. New weakness in both legs. EXAM: MRI THORACIC AND LUMBAR SPINE WITHOUT AND WITH CONTRAST TECHNIQUE: Multiplanar and multiecho pulse sequences of the thoracic and lumbar spine were obtained without and with intravenous contrast. CONTRAST:  7.5mL GADAVIST  GADOBUTROL  1 MMOL/ML IV SOLN COMPARISON:  Head CT 05/21/2023 and body CT 06/19/2023 FINDINGS: MRI THORACIC SPINE FINDINGS Alignment:  Mild degenerative anterolisthesis at T2-3. Vertebrae: Accentuated fatty marrow at T4-T10, likely related to esophageal radiation. Small lesion in the T12 body, compatible with metastatic disease when compared to prior PET-CT. No underlying fracture. Small lesions in the T9 and T10 vertebral body could be additional metastatic deposits. No extraosseous tumor extension. Cord:  Normal signal and morphology. Paraspinal and other soft tissues: Known liver and right adrenal masses. Disc levels: Generalized disc narrowing and desiccation with endplate and facet spurring. MRI LUMBAR SPINE FINDINGS Segmentation:  Standard. Alignment:  Slight L4-5 anterolisthesis.  Dextroscoliosis. Vertebrae: Infiltrative bone marrow signal abnormality at L2, correlating with PET CT. No extraosseous tumor or insufficiency fracture. No incidental bone infection. Conus medullaris: Extends to the T12-L1 level and appears normal. Normal appearance of the cauda equina . Paraspinal and other soft tissues: Known from visceral imaging. Disc levels: T12- L1: Spondylosis.  No neural impingement L1-L2: Disc height loss and spondylosis.  No neural  impingement L2-L3: Disc narrowing and bulging with biforaminal protrusion. Mild facet spurring. L3-L4: Disc narrowing and circumferential bulging with central protrusion. Degenerative facet spurring on both sides. Mild triangular narrowing of the thecal sac. L4-L5: Degenerative facet spurring which is bulky. Mild anterolisthesis. The disc is narrowed and bulging with moderate left subarticular recess stenosis. The foramina are patent L5-S1:Disc narrowing and bulging with right inferior foraminal protrusion. Degenerative facet spurring on both sides. Mild right subarticular recess and foraminal narrowing. IMPRESSION: Thoracic spine: Limited metastatic disease seen at the T9, T10, and T12 bodies. No extraosseous tumor or cord signal abnormality to correlate with weakness. Lumbar spine: 1. L2 vertebral body metastasis, known from prior PET-CT. No extra-osseous extension, fracture, or other explanation for weakness. 2. Generalized lumbar spine degeneration as described. Diffusely patent spinal canal. Electronically Signed   By: Dorn Roulette M.D.   On: 07/29/2023 08:57

## 2023-08-11 NOTE — Assessment & Plan Note (Addendum)
 Stage IV esophageal adenocarcinoma, liver metastatic disease, HER 2 negative. KRAS G12V, TMB 5.3, MSI Stable.  CPS 65%  1st line  FOLFOX , palliative RT --> 12/2022 CT progression--> switch to 5-FU + Nivolumab  Q2 weeks  --> 02/2023 PET progression- pneumonitis--> 03/27/23  3rd line  Taxol  and Cyramza  --> 05/02/23 CT during admission showed slight decrease of liver lesion size. --> 10/30 PET obtained by radonc -liver lesion 10cm, bone mets. --> 4th line FOLFIRI Refer to [facility] for expert opinion. He declined.  GI bleeding due to esophageal cancer, s/p  palliative RT Labs are reviewed and discussed with patient. S/p cycle 2  FOLFIRI -irinotecan  dose reduction to 100mg /m2  Hold off chemotherapy due to acute neurological symptoms. I recommend patient to go to ER for CVA work up. Other differential includes seizure

## 2023-08-11 NOTE — Progress Notes (Signed)
 OT Cancellation Note  Patient Details Name: ELYE HARMSEN MRN: [id number] DOB: 07-Feb-1956   Cancelled Treatment:    Reason Eval/Treat Not Completed: Patient at procedure or test/ unavailable. Pt out of the room upon attempt. Will re-attempt next date.   Kinsley Holderman R., MPH, MS, OTR/L ascom [phone] 08/11/23, 4:41 PM

## 2023-08-11 NOTE — ED Triage Notes (Signed)
 Pt to ED from cancer center for slurred speech, left side facial droop, left arm weakness started at 0800 and last for 1 minute per cancer center RN. Reports sx have completely resolved.

## 2023-08-11 NOTE — ED Provider Notes (Signed)
 [facility] Provider Note    Event Date/Time   First MD Initiated Contact with Patient 08/11/23 [phone]     (approximate)   History   Weakness   HPI Jonathan [name] is a 68 y.o. male history of esophageal cancer on chemo presenting with transient left arm weakness and slurred speech.  Per independent chart review patient was at his oncology treatment center for chemo, as they were assessing his port they noticed at 808 that patient was staring off with slurred speech and left arm weakness and left facial droop.  Per the note in a minute, he had return to baseline.  Dr. Babara was made aware, assess patient and he was taken to the ED for assessment.     Physical Exam   Triage Vital Signs: ED Triage Vitals [08/11/23 0832]  Encounter Vitals Group     BP 122/82     Systolic BP Percentile      Diastolic BP Percentile      Pulse Rate 92     Resp 16     Temp 98 F (36.7 C)     Temp src      SpO2 98 %     Weight 175 lb (79.4 kg)     Height 5' 9 (1.753 m)     Head Circumference      Peak Flow      Pain Score 0     Pain Loc      Pain Education      Exclude from Growth Chart     Most recent vital signs: Vitals:   08/11/23 1401 08/11/23 1430  BP:  133/86  Pulse: 97 (!) 101  Resp:  15  Temp:    SpO2: 97% 97%    General: Awake, no distress.  CV:  Good peripheral perfusion.  Resp:  Normal effort.  Abd:  No distention.  Other:  No facial droop, pupils equal reactive, extraocular movements are intact, no visual field deficits, he has equal strength to his upper and lower extremity without any obvious weakness, no sensory deficits to his face or upper and lower extremity.  No dysmetria.   ED Results / Procedures / Treatments   Labs (all labs ordered are listed, but only abnormal results are displayed) Labs Reviewed  APTT - Abnormal; Notable for the following components:      Result Value   aPTT 23 (*)    All other components within normal limits   CBC - Abnormal; Notable for the following components:   Hemoglobin 12.3 (*)    RDW 16.9 (*)    All other components within normal limits  COMPREHENSIVE METABOLIC PANEL - Abnormal; Notable for the following components:   Potassium 2.9 (*)    All other components within normal limits  PROTIME-INR  DIFFERENTIAL  ETHANOL  HEMOGLOBIN A1C  MAGNESIUM  CBG MONITORING, ED     EKG  EKG showed sinus rhythm, rate 91, normal QS, normal QTc, T wave flattening in aVL, 3, no ischemic ST elevation, no depressions.  Not significant change compared to prior   RADIOLOGY CT head imaging on my review and interpretation showed no obvious intracranial hemorrhage.  Radiology report also noted no acute intracranial hemorrhage.   PROCEDURES:  Critical Care performed: No  Procedures   MEDICATIONS ORDERED IN ED: Medications  acetaminophen  (TYLENOL ) tablet 650 mg (has no administration in time range)  metoprolol  tartrate (LOPRESSOR ) tablet 25 mg (has no administration in time range)  OLANZapine  (ZYPREXA )  tablet 5-10 mg (has no administration in time range)  prochlorperazine  (COMPAZINE ) tablet 10 mg (has no administration in time range)  levothyroxine  (SYNTHROID ) tablet 125 mcg (has no administration in time range)  predniSONE  (DELTASONE ) tablet 30 mg (has no administration in time range)  dronabinol  (MARINOL ) capsule 5 mg (has no administration in time range)  loperamide  (IMODIUM ) capsule 2 mg (has no administration in time range)  pantoprazole  (PROTONIX ) EC tablet 40 mg (has no administration in time range)  potassium chloride  SA (KLOR-CON  M) CR tablet 40 mEq (has no administration in time range)  ipratropium (ATROVENT ) 0.03 % nasal spray 2 spray (has no administration in time range)   stroke: early stages of recovery book (has no administration in time range)  senna-docusate (Senokot-S) tablet 1 tablet (has no administration in time range)  sodium chloride  flush (NS) 0.9 % injection 3-10 mL (has  no administration in time range)  sodium chloride  flush (NS) 0.9 % injection 3-10 mL (has no administration in time range)  enoxaparin  (LOVENOX ) injection 40 mg (has no administration in time range)  LORazepam  (ATIVAN ) tablet 0.5 mg (has no administration in time range)  aspirin  EC tablet 81 mg (has no administration in time range)  clopidogrel  (PLAVIX ) tablet 75 mg (has no administration in time range)  sodium chloride  flush (NS) 0.9 % injection 3 mL (3 mLs Intravenous Given 08/11/23 0905)  potassium chloride  (KLOR-CON ) packet 40 mEq (40 mEq Oral Given 08/11/23 1300)  ondansetron  (ZOFRAN -ODT) disintegrating tablet 4 mg (4 mg Oral Given 08/11/23 1329)  iohexol  (OMNIPAQUE ) 350 MG/ML injection 75 mL (75 mLs Intravenous Contrast Given 08/11/23 1459)     IMPRESSION / MDM / ASSESSMENT AND PLAN / ED COURSE  I reviewed the triage vital signs and the nursing notes.                              Differential diagnosis includes, but is not limited to, TIA, CVA, electrolyte derangements.   Patient's presentation is most consistent with acute complicated illness / injury requiring diagnostic workup.  Patient presenting with transient focal deficits, history of esophageal cancer on chemo.  No indication for stroke activation this time since his symptoms had completely resolved.  Dependent review of labs, potassium is 2.9, this is lower compared to prior, where we replete, alcohol level is 0, no leukocytosis, his platelets are normal.  Given that he is high risk as well as history of cancer on chemo, we will plan to have him admitted for further management of his TIA.  Consulted neurology who is agreeable with plan and will see patient after he is admitted.  Consult to hospitalist for admission who is agreeable to plan for admission will evaluate the patient.  He is admitted.  Clinical Course as of 08/11/23 1522  Mon Aug 11, 2023  9073 CT HEAD WO CONTRAST Independent review of imaging as well as results, no  obvious intracranial hemorrhage.  Radiology report confirms no ICH. [TT]  [id number] On initial assessment the patient, he has no focal neurodeficits, NIH stroke scale 0. [TT]  1158 Consulted with Dr. Matthews by secure chat message.  She agreed with plan to admit to hospitalist for additional neuro evaluation of probable TIA, and she will see the patient later today.  Consulting hospitalist for admission. [TT]    Clinical Course User Index [TT] Waymond Lorelle Cummins, MD     FINAL CLINICAL IMPRESSION(S) / ED DIAGNOSES   Final diagnoses:  TIA (transient ischemic attack)  Malignant neoplasm of esophagus, unspecified location [facility])  Immunocompromised state (HCC)     Rx / DC Orders   ED Discharge Orders     None        Note:  This document was prepared using Dragon voice recognition software and may include unintentional dictation errors.   Waymond Lorelle Cummins, MD 08/11/23 [phone]

## 2023-08-12 ENCOUNTER — Observation Stay: Payer: No Typology Code available for payment source

## 2023-08-12 ENCOUNTER — Other Ambulatory Visit: Payer: Self-pay | Admitting: Oncology

## 2023-08-12 ENCOUNTER — Observation Stay (HOSPITAL_BASED_OUTPATIENT_CLINIC_OR_DEPARTMENT_OTHER)
Admit: 2023-08-12 | Discharge: 2023-08-12 | Disposition: A | Discharge status: Home / Self Care | Payer: No Typology Code available for payment source | Attending: Internal Medicine | Admitting: Internal Medicine

## 2023-08-12 DIAGNOSIS — Q2112 Patent foramen ovale: Secondary | ICD-10-CM | POA: Diagnosis not present

## 2023-08-12 DIAGNOSIS — G459 Transient cerebral ischemic attack, unspecified: Secondary | ICD-10-CM

## 2023-08-12 DIAGNOSIS — C159 Malignant neoplasm of esophagus, unspecified: Secondary | ICD-10-CM

## 2023-08-12 LAB — BASIC METABOLIC PANEL
Anion gap: 9 (ref 5–15)
BUN: 11 mg/dL (ref 8–23)
CO2: 26 mmol/L (ref 22–32)
Calcium: 9 mg/dL (ref 8.9–10.3)
Chloride: 106 mmol/L (ref 98–111)
Creatinine, Ser: 0.8 mg/dL (ref 0.61–1.24)
GFR, Estimated: >60 mL/min (ref >60)
Glucose, Bld: 81 mg/dL (ref 70–99)
Potassium: 3.8 mmol/L (ref 3.5–5.1)
Sodium: 141 mmol/L (ref 135–145)

## 2023-08-12 LAB — LIPID PANEL
Cholesterol: 162 mg/dL (ref 0–200)
HDL: 54 mg/dL (ref >40)
LDL Cholesterol: 83 mg/dL (ref 0–99)
Total CHOL/HDL Ratio: 3 {ratio}
Triglycerides: 123 mg/dL (ref <150)
VLDL: 25 mg/dL (ref 0–40)

## 2023-08-12 LAB — ECHOCARDIOGRAM COMPLETE
AR max vel: 3.56 cm2
AV Area VTI: 4.62 cm2
AV Area mean vel: 3.55 cm2
AV Mean grad: 2 mm[Hg]
AV Peak grad: 2.9 mm[Hg]
Ao pk vel: 0.86 m/s
Area-P 1/2: 3.23 cm2
Height: 69 in
MV VTI: 3.51 cm2
S' Lateral: 2.6 cm
Weight: 2800 [oz_av]

## 2023-08-12 LAB — CEA: CEA: 140 ng/mL — ABNORMAL HIGH (ref 0.0–4.7)

## 2023-08-12 MED ORDER — ASPIRIN 81 MG PO TBEC: 81.0000 mg | DELAYED_RELEASE_TABLET | Freq: Every day | ORAL | 12 refills | Status: DC

## 2023-08-12 MED ORDER — CLOPIDOGREL BISULFATE 75 MG PO TABS: 75.0000 mg | ORAL_TABLET | Freq: Every day | ORAL | 0 refills | Status: DC

## 2023-08-12 MED ORDER — HEPARIN SOD (PORK) LOCK FLUSH 100 UNIT/ML IV SOLN
300.0000 [IU] | Freq: Once | INTRAVENOUS | Status: AC
  Administered 2023-08-12: 300 [IU] via INTRAVENOUS
  Filled 2023-08-12: qty 5

## 2023-08-12 NOTE — ED Notes (Signed)
 ED TO INPATIENT HANDOFF REPORT  ED Nurse Name and Phone #: Braulio RAMAN Name/Age/Gender Jonathan [name] 68 y.o. male Room/Bed: ED36A/ED36A  Code Status   Code Status: Full Code  Home/SNF/Other Home Patient oriented to: self, place, time, and situation Is this baseline? Yes   Triage Complete: Triage complete  Chief Complaint TIA (transient ischemic attack) [G45.9]  Triage Note Pt to ED from cancer center for slurred speech, left side facial droop, left arm weakness started at 0800 and last for 1 minute per cancer center RN. Reports sx have completely resolved.    Allergies No Known Allergies  Level of Care/Admitting Diagnosis ED Disposition     ED Disposition  Admit   Condition  --   Comment  Hospital Area: [facility] [[id number]]  Level of Care: Telemetry Medical [104]  Covid Evaluation: Asymptomatic - no recent exposure (last 10 days) testing not required  Diagnosis: TIA (transient ischemic attack) [832385]  Admitting Physician: LAURITA CORT DASEN [[id number]]  Attending Physician: LAURITA CORT DASEN [[id number]]          B Medical/Surgery History Past Medical History:  Diagnosis Date   Arthritis    Complication of anesthesia    OCCURRED ONCE YEARS AGO 1981   Deep venous thrombosis (HCC) 10/28/2022   Esophageal mass    Essential hypertension 05/01/2023   Hypertension    Hypothyroidism    PONV (postoperative nausea and vomiting)    Past Surgical History:  Procedure Laterality Date   COLONOSCOPY     ESOPHAGOGASTRODUODENOSCOPY N/A 08/23/2022   Procedure: ESOPHAGOGASTRODUODENOSCOPY (EGD);  Surgeon: Onita Elspeth Sharper, DO;  Location: [facility] ENDOSCOPY;  Service: Gastroenterology;  Laterality: N/A;   ESOPHAGOGASTRODUODENOSCOPY (EGD) WITH PROPOFOL  N/A 05/05/2023   Procedure: ESOPHAGOGASTRODUODENOSCOPY (EGD) WITH PROPOFOL ;  Surgeon: Onita Elspeth Sharper, DO;  Location: [facility] ENDOSCOPY;  Service: Gastroenterology;  Laterality: N/A;   EUS N/A 09/12/2022    Procedure: FULL UPPER ENDOSCOPIC ULTRASOUND (EUS) RADIAL;  Surgeon: Queenie Asberry LABOR, MD;  Location: [facility] ENDOSCOPY;  Service: Gastroenterology;  Laterality: N/A;   FINGER SURGERY     HEMOSTASIS CONTROL  05/05/2023   Procedure: HEMOSTASIS CONTROL;  Surgeon: Onita Elspeth Sharper, DO;  Location: [facility] ENDOSCOPY;  Service: Gastroenterology;;   PORTA CATH INSERTION N/A 10/17/2022   Procedure: PORTA CATH INSERTION;  Surgeon: Marea Selinda RAMAN, MD;  Location: [facility] INVASIVE CV LAB;  Service: Cardiovascular;  Laterality: N/A;   TENDON REPAIR IN LEFT KNEE       A IV Location/Drains/Wounds Patient Lines/Drains/Airways Status     Active Line/Drains/Airways     Name Placement date Placement time Site Days   Implanted Port 10/17/22 Right Chest 10/17/22  0901  Chest  299            Intake/Output Last 24 hours  Intake/Output Summary (Last 24 hours) at 08/12/2023 1030 Last data filed at 08/11/2023 1701 Gross per 24 hour  Intake 3 ml  Output --  Net 3 ml    Labs/Imaging Results for orders placed or performed during the hospital encounter of 08/11/23 (from the past 48 hours)  CBG monitoring, ED     Status: None   Collection Time: 08/11/23  8:33 AM  Result Value Ref Range   Glucose-Capillary 73 70 - 99 mg/dL    Comment: Glucose reference range applies only to samples taken after fasting for at least 8 hours.   Comment 1 Notify RN    Comment 2 Document in Chart   Protime-INR     Status: None  Collection Time: 08/11/23  8:35 AM  Result Value Ref Range   Prothrombin Time 13.6 11.4 - 15.2 seconds   INR 1.0 0.8 - 1.2    Comment: (NOTE) INR goal varies based on device and disease states. Performed at [facility], [address] [place], [place] [address]   APTT     Status: Abnormal   Collection Time: 08/11/23  8:35 AM  Result Value Ref Range   aPTT 23 (L) 24 - 36 seconds    Comment: Performed at [facility], [address] [place], [place] [address]  CBC      Status: Abnormal   Collection Time: 08/11/23  8:35 AM  Result Value Ref Range   WBC 6.5 4.0 - 10.5 K/uL   RBC 4.39 4.22 - 5.81 MIL/uL   Hemoglobin 12.3 (L) 13.0 - 17.0 g/dL   HCT 60.6 60.9 - 47.9 %   MCV 89.5 80.0 - 100.0 fL   MCH 28.0 26.0 - 34.0 pg   MCHC 31.3 30.0 - 36.0 g/dL   RDW 83.0 (H) 88.4 - 84.4 %   Platelets 234 150 - 400 K/uL   nRBC 0.0 0.0 - 0.2 %    Comment: Performed at [facility], [address] [place], [place] [address]  Differential     Status: None   Collection Time: 08/11/23  8:35 AM  Result Value Ref Range   Neutrophils Relative % 84 %   Neutro Abs 5.4 1.7 - 7.7 K/uL   Lymphocytes Relative 10 %   Lymphs Abs 0.7 0.7 - 4.0 K/uL   Monocytes Relative 5 %   Monocytes Absolute 0.4 0.1 - 1.0 K/uL   Eosinophils Relative 1 %   Eosinophils Absolute 0.0 0.0 - 0.5 K/uL   Basophils Relative 0 %   Basophils Absolute 0.0 0.0 - 0.1 K/uL   Immature Granulocytes 0 %   Abs Immature Granulocytes 0.02 0.00 - 0.07 K/uL    Comment: Performed at [facility] [place], [address] [place], [place] [address]  Comprehensive metabolic panel     Status: Abnormal   Collection Time: 08/11/23  8:35 AM  Result Value Ref Range   Sodium 137 135 - 145 mmol/L   Potassium 2.9 (L) 3.5 - 5.1 mmol/L   Chloride 102 98 - 111 mmol/L   CO2 22 22 - 32 mmol/L   Glucose, Bld 84 70 - 99 mg/dL    Comment: Glucose reference range applies only to samples taken after fasting for at least 8 hours.   BUN 13 8 - 23 mg/dL   Creatinine, Ser 9.12 0.61 - 1.24 mg/dL   Calcium  9.0 8.9 - 10.3 mg/dL   Total Protein 6.8 6.5 - 8.1 g/dL   Albumin 3.5 3.5 - 5.0 g/dL   AST 17 15 - 41 U/L   ALT 22 0 - 44 U/L   Alkaline Phosphatase 89 38 - 126 U/L   Total Bilirubin 0.9 0.0 - 1.2 mg/dL   GFR, Estimated >39 >39 mL/min    Comment: (NOTE) Calculated using the CKD-EPI Creatinine Equation (2021)    Anion gap 13 5 - 15    Comment: Performed at [facility], [address] [place],  [place] [address]  Ethanol     Status: None   Collection Time: 08/11/23  8:35 AM  Result Value Ref Range   Alcohol, Ethyl (B) <10 <10 mg/dL    Comment: (NOTE) Lowest detectable limit for serum alcohol is 10 mg/dL.  For medical  purposes only. Performed at [facility], [address] [place], [place] [address]   Hemoglobin A1c     Status: Abnormal   Collection Time: 08/11/23  8:35 AM  Result Value Ref Range   Hgb A1c MFr Bld 5.7 (H) 4.8 - 5.6 %    Comment: (NOTE) Pre diabetes:          5.7%-6.4%  Diabetes:              >6.4%  Glycemic control for   <7.0% adults with diabetes    Mean Plasma Glucose 116.89 mg/dL    Comment: Performed at [facility], [address] [place], [place] [address]  Magnesium     Status: None   Collection Time: 08/11/23  8:35 AM  Result Value Ref Range   Magnesium 2.1 1.7 - 2.4 mg/dL    Comment: Performed at [facility], [address] [place], [place] [address]  Lipid panel     Status: None   Collection Time: 08/12/23  6:03 AM  Result Value Ref Range   Cholesterol 162 0 - 200 mg/dL   Triglycerides 876 <849 mg/dL   HDL 54 >59 mg/dL   Total CHOL/HDL Ratio 3.0 RATIO   VLDL 25 0 - 40 mg/dL   LDL Cholesterol 83 0 - 99 mg/dL    Comment:        Total Cholesterol/HDL:CHD Risk Coronary Heart Disease Risk Table                     Men   Women  1/2 Average Risk   3.4   3.3  Average Risk       5.0   4.4  2 X Average Risk   9.6   7.1  3 X Average Risk  23.4   11.0        Use the calculated Patient Ratio above and the CHD Risk Table to determine the patient's CHD Risk.        ATP III CLASSIFICATION (LDL):  <100     mg/dL   Optimal  899-870  mg/dL   Near or Above                    Optimal  130-159  mg/dL   Borderline  839-810  mg/dL   High  >809     mg/dL   Very High Performed at [facility], [address] [place], [place] [address]   Basic metabolic panel     Status: None   Collection Time: 08/12/23  6:03 AM   Result Value Ref Range   Sodium 141 135 - 145 mmol/L   Potassium 3.8 3.5 - 5.1 mmol/L   Chloride 106 98 - 111 mmol/L   CO2 26 22 - 32 mmol/L   Glucose, Bld 81 70 - 99 mg/dL    Comment: Glucose reference range applies only to samples taken after fasting for at least 8 hours.   BUN 11 8 - 23 mg/dL   Creatinine, Ser 9.19 0.61 - 1.24 mg/dL   Calcium  9.0 8.9 - 10.3 mg/dL   GFR, Estimated >39 >39 mL/min    Comment: (NOTE) Calculated using the CKD-EPI Creatinine Equation (2021)    Anion gap 9 5 - 15    Comment: Performed at [facility], [address], [place], [place] [address]   ECHOCARDIOGRAM COMPLETE Result Date: 08/12/2023    ECHOCARDIOGRAM REPORT   Patient Name:   SHAMOND SKELTON  Jessop Date of Exam: 08/12/2023 Medical Rec #:  [id number]         Height:       69.0 in Accession #:    [id number]        Weight:       175.0 lb Date of Birth:  03/26/1956         BSA:          1.952 m Patient Age:    67 years          BP:           133/91 mmHg Patient Gender: M                 HR:           76 bpm. Exam Location:  [facility] Procedure: 2D Echo, Cardiac Doppler, Color Doppler and Saline Contrast Bubble            Study Indications:     TIA G45.9  History:         Patient has no prior history of Echocardiogram examinations.                  Risk Factors:Hypertension. DVT.  Sonographer:     Jonathan Furnace Referring Phys:  [id number] CORT ONEIDA MANA Diagnosing Phys: Lonni Hanson MD IMPRESSIONS  1. Left ventricular ejection fraction, by estimation, is 55 to 60%. The left ventricle has normal function. The left ventricle has no regional wall motion abnormalities. There is mild left ventricular hypertrophy. Left ventricular diastolic parameters are consistent with Grade I diastolic dysfunction (impaired relaxation).  2. Right ventricular systolic function is normal. The right ventricular size is normal. Tricuspid regurgitation signal is inadequate for assessing PA pressure.  3. The mitral valve is normal in structure.  No evidence of mitral valve regurgitation. No evidence of mitral stenosis.  4. The aortic valve is tricuspid. Aortic valve regurgitation is not visualized. No aortic stenosis is present.  5. Agitated saline contrast bubble study was positive with shunting observed within 3-6 cardiac cycles suggestive of interatrial shunt. FINDINGS  Left Ventricle: Left ventricular ejection fraction, by estimation, is 55 to 60%. The left ventricle has normal function. The left ventricle has no regional wall motion abnormalities. The left ventricular internal cavity size was normal in size. There is  mild left ventricular hypertrophy. Left ventricular diastolic parameters are consistent with Grade I diastolic dysfunction (impaired relaxation). Right Ventricle: The right ventricular size is normal. No increase in right ventricular wall thickness. Right ventricular systolic function is normal. Tricuspid regurgitation signal is inadequate for assessing PA pressure. Left Atrium: Left atrial size was normal in size. Right Atrium: Right atrial size was normal in size. Pericardium: The pericardium was not well visualized. Mitral Valve: The mitral valve is normal in structure. No evidence of mitral valve regurgitation. No evidence of mitral valve stenosis. MV peak gradient, 3.9 mmHg. The mean mitral valve gradient is 1.0 mmHg. Tricuspid Valve: The tricuspid valve is normal in structure. Tricuspid valve regurgitation is trivial. Aortic Valve: The aortic valve is tricuspid. Aortic valve regurgitation is not visualized. No aortic stenosis is present. Aortic valve mean gradient measures 2.0 mmHg. Aortic valve peak gradient measures 2.9 mmHg. Aortic valve area, by VTI measures 4.62 cm. Pulmonic Valve: The pulmonic valve was not well visualized. Pulmonic valve regurgitation is trivial. No evidence of pulmonic stenosis. Aorta: The aortic root is normal in size and structure. Pulmonary Artery: The pulmonary artery is not well seen. Venous: The  inferior  vena cava was not well visualized. IAS/Shunts: The interatrial septum was not well visualized. Agitated saline contrast was given intravenously to evaluate for intracardiac shunting. Agitated saline contrast bubble study was positive with shunting observed within 3-6 cardiac cycles suggestive of interatrial shunt.  LEFT VENTRICLE PLAX 2D LVIDd:         3.70 cm   Diastology LVIDs:         2.60 cm   LV e' medial:    6.20 cm/s LV PW:         1.30 cm   LV E/e' medial:  11.0 LV IVS:        1.20 cm   LV e' lateral:   11.70 cm/s LVOT diam:     2.30 cm   LV E/e' lateral: 5.8 LV SV:         71 LV SV Index:   36 LVOT Area:     4.15 cm  RIGHT VENTRICLE RV Basal diam:  2.60 cm RV Mid diam:    2.60 cm LEFT ATRIUM             Index       RIGHT ATRIUM          Index LA diam:        1.80 cm 0.92 cm/m  RA Area:     8.90 cm LA Vol (A2C):   12.6 ml 6.45 ml/m  RA Volume:   14.30 ml 7.33 ml/m LA Vol (A4C):   10.2 ml 5.23 ml/m LA Biplane Vol: 12.2 ml 6.25 ml/m  AORTIC VALVE AV Area (Vmax):    3.56 cm AV Area (Vmean):   3.55 cm AV Area (VTI):     4.62 cm AV Vmax:           85.65 cm/s AV Vmean:          59.750 cm/s AV VTI:            0.153 m AV Peak Grad:      2.9 mmHg AV Mean Grad:      2.0 mmHg LVOT Vmax:         73.40 cm/s LVOT Vmean:        51.100 cm/s LVOT VTI:          0.170 m LVOT/AV VTI ratio: 1.11  AORTA Ao Root diam: 3.40 cm MITRAL VALVE MV Area (PHT): 3.23 cm    SHUNTS MV Area VTI:   3.51 cm    Systemic VTI:  0.17 m MV Peak grad:  3.9 mmHg    Systemic Diam: 2.30 cm MV Mean grad:  1.0 mmHg MV Vmax:       0.98 m/s MV Vmean:      52.6 cm/s MV Decel Time: 235 msec MV E velocity: 68.10 cm/s MV A velocity: 95.40 cm/s MV E/A ratio:  0.71 Lonni End MD Electronically signed by Lonni Hanson MD Signature Date/Time: 08/12/2023/10:12:56 AM    Final    MR BRAIN WO CONTRAST Result Date: 08/11/2023 CLINICAL DATA:  TIA. Episode of slurred speech, left facial droop and left upper extremity weakness lasting  approximately 1 minute. EXAM: MRI HEAD WITHOUT CONTRAST TECHNIQUE: Multiplanar, multiecho pulse sequences of the brain and surrounding structures were obtained without intravenous contrast. COMPARISON:  CT head without contrast 08/11/2023. MR head without and with contrast 06/04/2023. FINDINGS: Brain: No acute infarct, hemorrhage, or mass lesion is present. Scattered subcortical T2 hyperintensities are stable and likely within normal limits for age. The ventricles are of normal size.  No significant extraaxial fluid collection is present. Deep brain nuclei are within normal limits. The brainstem and cerebellum are within normal limits. Midline structures are within normal limits. The internal auditory canals are within normal limits. Vascular: Flow is present in the major intracranial arteries. The globes and orbits are within normal limits. Skull and upper cervical spine: Degenerative changes are present at the C3-4 disc level. At least partial effacement of ventral CSF is present. The craniocervical junction is normal. Marrow signal is normal. Sinuses/Orbits: The paranasal sinuses and mastoid air cells are clear. The globes and orbits are within normal limits. IMPRESSION: 1. Normal MRI appearance of the brain for age. No acute or focal lesion to explain the patient's symptoms. 2. Degenerative changes at the C3-4 disc level with at least partial effacement of ventral CSF. Electronically Signed   By: Lonni Necessary M.D.   On: 08/11/2023 18:39   CT ANGIO HEAD NECK W WO CM Result Date: 08/11/2023 CLINICAL DATA:  Episode of slurred speech, left-sided facial droop and left upper extremity weakness lasting 1 minute at 8 o'clock a.m. today at the cancer center. Esophageal cancer. EXAM: CT ANGIOGRAPHY HEAD AND NECK WITH AND WITHOUT CONTRAST TECHNIQUE: Multidetector CT imaging of the head and neck was performed using the standard protocol during bolus administration of intravenous contrast. Multiplanar CT image  reconstructions and MIPs were obtained to evaluate the vascular anatomy. Carotid stenosis measurements (when applicable) are obtained utilizing NASCET criteria, using the distal internal carotid diameter as the denominator. RADIATION DOSE REDUCTION: This exam was performed according to the departmental dose-optimization program which includes automated exposure control, adjustment of the mA and/or kV according to patient size and/or use of iterative reconstruction technique. CONTRAST:  75mL OMNIPAQUE  IOHEXOL  350 MG/ML SOLN COMPARISON:  CT head without contrast 08/11/2023 FINDINGS: CTA NECK FINDINGS Aortic arch: Atherosclerotic calcifications are present at the distal aortic arch. No stenosis or aneurysm is present. No significant changes are present at the great vessel origins. A 3 vessel arch configuration is present. No dissection is present. Right carotid system: The right common carotid artery is mildly tortuous without focal stenosis. Bifurcation is within normal limits. Moderate tortuosity is present cervical right ICA without significant stenosis. Left carotid system: The left common carotid artery is within normal limits. Atherosclerotic calcifications are present at bifurcation without significant stenosis relative to the more distal vessel. The cervical left ICA is normal. Vertebral arteries: The right vertebral artery is the dominant vessel. Both vertebral arteries originate from the subclavian arteries without significant stenosis. No significant stenosis is present in either vertebral artery in the neck. Skeleton: Multilevel degenerative changes are present cervical spine. No focal osseous lesions are present. Uncovertebral spurring contributes to foraminal narrowing bilaterally at C4-5, C5-6 and C6-7. Other neck: Soft tissues the neck are otherwise unremarkable. Salivary glands are within normal limits. Thyroid  is normal. No significant adenopathy is present. No focal mucosal or submucosal lesions are  present. A right IJ Port-A-Cath is in place. Upper chest: The lung apices are clear. The thoracic inlet is within normal limits. No significant pleural effusion or pneumothorax is present. Review of the MIP images confirms the above findings CTA HEAD FINDINGS Anterior circulation: Atherosclerotic calcifications are present within the cavernous internal carotid arteries. No significant stenosis is present. The ICA termini are normal bilaterally. The A1 and M1 segments are normal. The MCA bifurcations are normal bilaterally. ACA and MCA branch vessels are within normal limits. Posterior circulation: PICA origins are visualized and normal. Vertebrobasilar junction basilar artery  normal. The superior cerebellar arteries are patent. Both posterior cerebral arteries originate from basilar tip. A left posterior communicating artery contributes. The is PCA branch vessels are within normal limits. Venous sinuses: The dural sinuses are patent. The straight sinus and deep cerebral veins are intact. Cortical veins are within normal limits. No significant vascular malformation is evident. Anatomic variants: None Review of the MIP images confirms the above findings IMPRESSION: 1. No emergent large vessel occlusion. 2. Atherosclerotic changes at the left carotid bifurcation and cavernous internal carotid arteries without significant stenosis relative to the more distal vessels. 3. Multilevel degenerative changes of the cervical spine. 4.  Aortic Atherosclerosis (ICD10-I70.0). Electronically Signed   By: Lonni Necessary M.D.   On: 08/11/2023 18:37   CT HEAD WO CONTRAST Result Date: 08/11/2023 CLINICAL DATA:  Provided history: Neuro deficit, acute, stroke suspected. Slurred speech, left-sided facial droop, left arm weakness. Patient reports deficits have now resolved. EXAM: CT HEAD WITHOUT CONTRAST TECHNIQUE: Contiguous axial images were obtained from the base of the skull through the vertex without intravenous contrast.  RADIATION DOSE REDUCTION: This exam was performed according to the departmental dose-optimization program which includes automated exposure control, adjustment of the mA and/or kV according to patient size and/or use of iterative reconstruction technique. COMPARISON:  Brain MRI 06/04/2023. FINDINGS: Brain: Mild generalized parenchymal atrophy. There is no acute intracranial hemorrhage. No demarcated cortical infarct. No extra-axial fluid collection. No evidence of an intracranial mass. No midline shift. Vascular: No hyperdense vessel.  Atherosclerotic calcifications. Skull: No calvarial fracture or aggressive osseous lesion. Sinuses/Orbits: No mass or acute finding within the imaged orbits. No significant paranasal sinus disease at the imaged levels. Other: Trace fluid within the ethmoid air cells. IMPRESSION: 1. No evidence of an acute intracranial abnormality. 2. Mild generalized parenchymal atrophy. Electronically Signed   By: Rockey Childs D.O.   On: 08/11/2023 09:04    Pending Labs Unresulted Labs (From admission, onward)     Start     Ordered   08/13/23 0500  CBC  Daily,   R       Question:  Specimen collection method  Answer:  Unit=Unit collect   08/12/23 0932   08/13/23 0500  Basic metabolic panel  Daily,   R       Question:  Specimen collection method  Answer:  Unit=Unit collect   08/12/23 0932   08/13/23 0500  Phosphorus  Daily,   R       Question:  Specimen collection method  Answer:  Unit=Unit collect   08/12/23 0932   08/13/23 0500  Magnesium  Daily,   R       Question:  Specimen collection method  Answer:  Unit=Unit collect   08/12/23 0932            Vitals/Pain Today's Vitals   08/12/23 0500 08/12/23 0600 08/12/23 0917 08/12/23 1028  BP: 116/84 (!) 133/91 (!) 134/97 123/85  Pulse: 80 76 88 84  Resp: (!) 21 18 19 14   Temp:   98.4 F (36.9 C)   TempSrc:   Oral   SpO2: 97% 98% 98% 100%  Weight:      Height:      PainSc:        Isolation Precautions No active  isolations  Medications Medications  acetaminophen  (TYLENOL ) tablet 650 mg (has no administration in time range)  metoprolol  tartrate (LOPRESSOR ) tablet 25 mg (25 mg Oral Given 08/12/23 1029)  OLANZapine  (ZYPREXA ) tablet 5-10 mg (has no administration in time range)  prochlorperazine  (COMPAZINE ) tablet 10 mg (has no administration in time range)  levothyroxine  (SYNTHROID ) tablet 125 mcg (125 mcg Oral Given 08/12/23 0604)  predniSONE  (DELTASONE ) tablet 30 mg (30 mg Oral Given 08/12/23 0840)  dronabinol  (MARINOL ) capsule 5 mg (5 mg Oral Given 08/11/23 1817)  loperamide  (IMODIUM ) capsule 2 mg (has no administration in time range)  pantoprazole  (PROTONIX ) EC tablet 40 mg (40 mg Oral Given 08/12/23 1029)  potassium chloride  SA (KLOR-CON  M) CR tablet 40 mEq (40 mEq Oral Given 08/12/23 1029)  ipratropium (ATROVENT ) 0.03 % nasal spray 2 spray (2 sprays Each Nare Patient Refused/Not Given 08/12/23 1029)   stroke: early stages of recovery book (has no administration in time range)  senna-docusate (Senokot-S) tablet 1 tablet (has no administration in time range)  sodium chloride  flush (NS) 0.9 % injection 3-10 mL (3 mLs Intravenous Given 08/12/23 1029)  sodium chloride  flush (NS) 0.9 % injection 3-10 mL (has no administration in time range)  enoxaparin  (LOVENOX ) injection 40 mg (40 mg Subcutaneous Given 08/11/23 2310)  LORazepam  (ATIVAN ) tablet 0.5 mg (has no administration in time range)  aspirin  EC tablet 81 mg (81 mg Oral Given 08/12/23 1029)  clopidogrel  (PLAVIX ) tablet 75 mg (75 mg Oral Given 08/12/23 1029)  sodium chloride  flush (NS) 0.9 % injection 3 mL (3 mLs Intravenous Given 08/11/23 0905)  potassium chloride  (KLOR-CON ) packet 40 mEq (40 mEq Oral Given 08/11/23 1300)  ondansetron  (ZOFRAN -ODT) disintegrating tablet 4 mg (4 mg Oral Given 08/11/23 1329)  iohexol  (OMNIPAQUE ) 350 MG/ML injection 75 mL (75 mLs Intravenous Contrast Given 08/11/23 1459)    Mobility walks     Focused Assessments Neuro  Assessment Handoff:  Swallow screen pass? Yes    NIH Stroke Scale  Dizziness Present: No Headache Present: No Interval: Shift assessment Level of Consciousness (1a.)   : Alert, keenly responsive LOC Questions (1b. )   : Answers both questions correctly LOC Commands (1c. )   : Performs both tasks correctly Best Gaze (2. )  : Normal Visual (3. )  : No visual loss Facial Palsy (4. )    : Normal symmetrical movements Motor Arm, Left (5a. )   : No drift Motor Arm, Right (5b. ) : No drift Motor Leg, Left (6a. )  : No drift Motor Leg, Right (6b. ) : No drift Limb Ataxia (7. ): Absent Sensory (8. )  : Normal, no sensory loss Best Language (9. )  : No aphasia Dysarthria (10. ): Normal Extinction/Inattention (11.)   : No Abnormality Complete NIHSS TOTAL: 0     Neuro Assessment: Within Defined Limits Neuro Checks:   Initial (08/11/23 0908)  Has TPA been given? No If patient is a Neuro Trauma and patient is going to OR before floor call report to 4N Charge nurse: [phone] or [phone]   R Recommendations: See Admitting Provider Note  Report given to:   Additional Notes:

## 2023-08-12 NOTE — Progress Notes (Signed)
 Physical Therapy Evaluation Patient Details Name: Jonathan [name] MRN: [id number] DOB: 1956/04/30 Today's Date: 08/12/2023  History of Present Illness  68 y.o. 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   Clinical Impression  Patient agreeable to PT evaluation this date. Prior to admission, patient reports IND with mobility and ADLs, with no AD use. Denies fall history. Patient lives alone in 1 story condo with steps to enter. Patient currently seems to be close to functional baseline, however demonstrates some mild unsteadiness/imbalance with dynamic gait activities and stairs. Demonstrate low fall risk at this time. Patient overall Mod I to Supervision with all mobility tasks this date. Patient does have LLE strength impairments noted on evaluation. No skilled acute PT services needed at this time, all needs can be meet at next level of care to address impairments. Anticipate the need for follow up PT services upon acute hospital discharge. PT will sign off. Please re-consult if additional PT needs arise.          If plan is discharge home, recommend the following:     Can travel by private vehicle        Equipment Recommendations None recommended by PT  Recommendations for Other Services       Functional Status Assessment Patient has had a recent decline in their functional status and demonstrates the ability to make significant improvements in function in a reasonable and predictable amount of time.     Precautions / Restrictions Precautions Precautions: Fall Precaution Comments: Low Fall Risk Restrictions Weight Bearing Restrictions Per Provider Order: No      Mobility  Bed Mobility Overal bed mobility: Independent                  Transfers Overall  transfer level: Modified independent Equipment used: None               General transfer comment: No AD use; good stability noted.    Ambulation/Gait Ambulation/Gait assistance: Supervision Gait Distance (Feet): 100 Feet Assistive device: None Gait Pattern/deviations: WFL(Within Functional Limits)       General Gait Details: supervision for ambulation, mild imbalance noted with high level balance activiteis including head turns, dynamic gait. Encouraged use of SPC on unlevel outdoor surfaces.  Stairs Stairs: Yes Stairs assistance: Supervision Stair Management: No rails Number of Stairs: 8 General stair comments: no instability noted on stairs; cues for sequencing to promote improved stability.  Wheelchair Mobility     Tilt Bed    Modified Rankin (Stroke Patients Only)       Balance Overall balance assessment: Needs assistance Sitting-balance support: Feet supported Sitting balance-Leahy Scale: Normal     Standing balance support: No upper extremity supported, During functional activity Standing balance-Leahy Scale: Good                               Pertinent Vitals/Pain Pain Assessment Pain Assessment: No/denies pain    Home Living Family/patient expects to be discharged to:: Private residence Living Arrangements: Alone Available Help at Discharge: Family;Available PRN/intermittently (Sister/BIL) Type of Home: Other(Comment) (Condo) Home Access: Stairs to enter Entrance Stairs-Rails: None Entrance Stairs-Number of Steps: 4 at parking lot and 2 for porch   Home Layout: One level Home Equipment: Cane - single Librarian, Academic (  2 wheels)      Prior Function Prior Level of Function : Independent/Modified Independent;Driving             Mobility Comments: IND with mobility, No AD use ADLs Comments: IND     Extremity/Trunk Assessment   Upper Extremity Assessment Upper Extremity Assessment: Defer to OT evaluation LUE Deficits  / Details: shoulder flexion 4/5, pinch 4+/5 as compared to 5/5 RUE; FMC intact, pt endorses 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 and no new sensation impairments LUE Sensation: history of peripheral neuropathy LUE Coordination: WNL    Lower Extremity Assessment Lower Extremity Assessment: LLE deficits/detail;RLE deficits/detail RLE Deficits / Details: WNL RLE Sensation: history of peripheral neuropathy RLE Coordination: WNL LLE Deficits / Details: L hip flexion 4-/5, DF 4/5, Abd 4-/5; coordination intact. Hx of neuropathy LLE Sensation: history of peripheral neuropathy LLE Coordination: WNL    Cervical / Trunk Assessment Cervical / Trunk Assessment: Normal  Communication   Communication Communication: No apparent difficulties  Cognition Arousal: Alert Behavior During Therapy: WFL for tasks assessed/performed Overall Cognitive Status: Within Functional Limits for tasks assessed                                          General Comments      Exercises     Assessment/Plan    PT Assessment All further PT needs can be met in the next venue of care  PT Problem List Decreased balance;Decreased mobility;Decreased strength       PT Treatment Interventions      PT Goals (Current goals can be found in the Care Plan section)       Frequency       Co-evaluation               AM-PAC PT 6 Clicks Mobility  Outcome Measure Help needed turning from your back to your side while in a flat bed without using bedrails?: None Help needed moving from lying on your back to sitting on the side of a flat bed without using bedrails?: None Help needed moving to and from a bed to a chair (including a wheelchair)?: None Help needed standing up from a chair using your arms (e.g., wheelchair or bedside chair)?: None Help needed to walk in hospital room?: None Help needed climbing 3-5 steps with a railing? : A Little 6 Click Score: 23    End of Session  Equipment Utilized During Treatment: Gait belt Activity Tolerance: Patient tolerated treatment well Patient left: in bed;with call bell/phone within reach Nurse Communication: Mobility status PT Visit Diagnosis: Unsteadiness on feet (R26.81);Muscle weakness (generalized) (M62.81)    Time: 9149-9089 PT Time Calculation (min) (ACUTE ONLY): 20 min   Charges:   PT Evaluation $PT Eval Low Complexity: 1 Low   PT General Charges $$ ACUTE PT VISIT: 1 Visit         Carolyn HERO Fairly, PT, DPT 08/12/23 9:29 AM

## 2023-08-12 NOTE — ED Notes (Signed)
 Patient is eating lunch.

## 2023-08-12 NOTE — Evaluation (Signed)
 Occupational Therapy Evaluation Patient Details Name: Jonathan [name] MRN: [id number] DOB: 01-03-1956 Today's Date: 08/12/2023   History of Present Illness 68 y.o. 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   Clinical Impression   Pt seen for OT evaluation this date. Prior to hospital admission, pt was independent in all aspects of ADL/IADL and denies falls history in past 6 months. Pt lives by himself in a 1 story condo with 4+2 STE. Pt notes he has a SPC and 2WW at home but does not need/use them. Currently pt reporting symptoms have resolved and notes he feels back to baseline. Pt demonstrates baseline independence to perform ADL and grossly independent with mobility tasks. Noted mild LUE and LLE strength impairments but functionally not limiting (see detail below) and mild higher level balance deficits. No additional deficits appreciated with assessment. No skilled OT needs identified. Pt in agreement. Will sign off. Please re-consult if additional OT needs arise.     If plan is discharge home, recommend the following:      Functional Status Assessment  Patient has not had a recent decline in their functional status  Equipment Recommendations  None recommended by OT    Recommendations for Other Services       Precautions / Restrictions Precautions Precautions: Fall Precaution Comments: low-moderate falls risk Restrictions Weight Bearing Restrictions Per Provider Order: No      Mobility Bed Mobility Overal bed mobility: Independent                  Transfers Overall transfer level: Modified independent Equipment used: None               General transfer comment: pt cautious, notes he wanted to get my bearings      Balance Overall balance  assessment: Needs assistance Sitting-balance support: Feet supported Sitting balance-Leahy Scale: Normal     Standing balance support: No upper extremity supported, During functional activity Standing balance-Leahy Scale: Good                             ADL either performed or assessed with clinical judgement   ADL Overall ADL's : Independent                                       General ADL Comments: Pt indep with LB dressing, toileting, increased time for tying shoes but pt notes this is baseline     Vision         Perception         Praxis         Pertinent Vitals/Pain Pain Assessment Pain Assessment: No/denies pain     Extremity/Trunk Assessment Upper Extremity Assessment Upper Extremity Assessment: Right hand dominant;LUE deficits/detail LUE Deficits / Details: shoulder flexion 4/5, pinch 4+/5 as compared to 5/5 RUE; FMC intact, pt endorses 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 and no new sensation impairments LUE Sensation: history of peripheral neuropathy LUE Coordination: WNL   Lower Extremity Assessment Lower Extremity Assessment: LLE deficits/detail LLE Deficits / Details: L hip flexion 4/5, DF 4/5, coordination intact, pt endorses 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 and no new  sensation impairments LLE Sensation: history of peripheral neuropathy LLE Coordination: WNL   Cervical / Trunk Assessment Cervical / Trunk Assessment: Normal   Communication Communication Communication: No apparent difficulties   Cognition Arousal: Alert Behavior During Therapy: WFL for tasks assessed/performed Overall Cognitive Status: Within Functional Limits for tasks assessed                                       General Comments       Exercises     Shoulder Instructions      Home Living Family/patient expects to be discharged to:: Private residence Living Arrangements: Alone Available Help at  Discharge: Family;Available PRN/intermittently (sister and BIL) Type of Home: Other(Comment) (condo) Home Access: Stairs to enter [employer] of Steps: 4 at parking lot and 2 for porch Entrance Stairs-Rails: None (has a column if needed) Home Layout: One level     Bathroom Shower/Tub: Chief Strategy Officer: Standard     Home Equipment: Cane - single Librarian, Academic (2 wheels)          Prior Functioning/Environment Prior Level of Function : Independent/Modified Independent;Driving             Mobility Comments: no AD ADLs Comments: indep, no falls        OT Problem List: Decreased strength;Impaired sensation;Impaired balance (sitting and/or standing)      OT Treatment/Interventions:      OT Goals(Current goals can be found in the care plan section) Acute Rehab OT Goals Patient Stated Goal: go home OT Goal Formulation: All assessment and education complete, DC therapy  OT Frequency:      Co-evaluation              AM-PAC OT 6 Clicks Daily Activity     Outcome Measure Help from another person eating meals?: None Help from another person taking care of personal grooming?: None Help from another person toileting, which includes using toliet, bedpan, or urinal?: None Help from another person bathing (including washing, rinsing, drying)?: None Help from another person to put on and taking off regular upper body clothing?: None Help from another person to put on and taking off regular lower body clothing?: None 6 Click Score: 24   End of Session Nurse Communication: Mobility status  Activity Tolerance: Patient tolerated treatment well Patient left: in bed;Other (comment) (EOB with PT)  OT Visit Diagnosis: Hemiplegia and hemiparesis Hemiplegia - Right/Left: Left Hemiplegia - dominant/non-dominant: Non-Dominant Hemiplegia - caused by: Unspecified                Time: 9169-9149 OT Time Calculation (min): 20 min Charges:  OT  General Charges $OT Visit: 1 Visit OT Evaluation $OT Eval Low Complexity: 1 Low  Warren SAUNDERS., MPH, MS, OTR/L ascom [phone] 08/12/23, 9:03 AM

## 2023-08-12 NOTE — Progress Notes (Signed)
*  PRELIMINARY RESULTS* Echocardiogram 2D Echocardiogram has been performed.  Elzia, Hott 08/12/2023, 7:52 AM

## 2023-08-12 NOTE — ED Notes (Signed)
 OT at bedside.

## 2023-08-12 NOTE — Discharge Summary (Signed)
 [facility] Discharge Summary   Patient: Jonathan [name] FMW:[id number]  PCP: Valora Agent, MD  Date of admission: 08/11/2023   Date of discharge: 08/12/2023     Discharge Diagnoses:  Principal Problem:   TIA (transient ischemic attack)   Admitted From: Home Disposition:  Home home health PT  Recommendations for Outpatient Follow-up:  Follow-up with PCP in 1 week, repeat BMP after 1 week Follow-up with oncologist as per schedule Up with neurology in 1 to 2 weeks Follow up LABS/TEST:  As above   Follow-up Information     Valora Agent, MD Follow up in 1 week(s).   Specialty: Family Medicine Contact information: [address] [facility] [place] [address] [phone]                Diet recommendation: Cardiac diet  Activity: The patient is advised to gradually reintroduce usual activities, as tolerated  Discharge Condition: stable  Code Status: Full code   History of present illness: As per the H and P dictated on admission  Hospital Course:  Jonathan [name] is a 68 y.o. 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   Patient has been on chemotherapy since June last year, last month so formula is changed to FOLFIRI every 2 weeks and patient completed the second cycle 2 weeks ago.  Today patient went back to oncology orthopedic to have a third cycle of chemo.  At around 8:08, when patient described that the nurse started drew blood from his chest port, patient started to feel numbness and tingling sensation of bilateral fingertips and the right side of face then he found that he lost muscle strength on left arm temporarily as his arm dropped to the bed and with weak grip. Meantime, oncology staff noticed patient had right-sided facial  droop and slurred speech.  Oncology initially sent patient to ED for evaluation of stroke.  Symptoms however resolved before patient arrived in the ED.     ED Course: Afebrile, nontachycardic nonhypotensive nonhypoxic.  CT head without contrast showed no acute intracranial abnormalities.  Jk2.9, hemoglobin 12.3, WBC 6.5.    Assessment/Plan   TIA, symptoms resolved. -Brain MRI negative for any acute intracranial findings. Degenerative changes at the C3-4 disc level with at least partial effacement of ventral CSF -CTA head and neck negative for large vessel occlusion, atherosclerotic changes noticed. -Started aspirin  Plavix  dual antiplatelet for 3 weeks and then aspirin  alone therapy LDL 83, goal 70, A1c 5.7, slightly elevated.  Seen by speech and swallow, no dysphagia noticed. PT and OT eval done, recommended home health PT TTE shows LVEF 55 to 60%, grade 1 diastolic dysfunction, positive PFO Lower extremity venous duplex negative for DVT Patient was seen by neurology, recommended DAPT for 3 weeks, followed by aspirin  monotherapy.  Follow-up with neurology as an outpatient, cleared for discharge home today.   # Hypokalemia Acute on chronic, p.o. replacement and resume home KCl supplement Potassium level improved. Mag wnl # Hypothyroidism: Continue Synthroid  # HTN: Resumed home dose metoprolol  25 mg p.o. twice daily.  Monitor BP at home and follow with PCP. # Stage IV esophageal adenocarcinoma: Continue outpatient follow-up with oncology for chemotherapy -Supportive care including chronic prednisone , nauseous/vomiting medications   Body mass index is 25.84 kg/m.  Nutrition Interventions:  - Patient was instructed, not  to drive, operate heavy machinery, perform activities at heights, swimming or participation in water  activities or provide baby sitting services while on Pain, Sleep and Anxiety Medications; until his outpatient Physician has advised to do so again.  - Also recommended to  not to take more than prescribed Pain, Sleep and Anxiety Medications.  Patient was seen by physical therapy, who recommended Home health, which was arranged. On the day of the discharge the patient's vitals were stable, and no other acute medical condition were reported by patient. the patient was felt safe to be discharge at Home with Home health.  Consultants: Neurology, (could not see physically, received curbside recommendation) Procedures: None  Discharge Exam: General: Appear in no distress, no Rash; Oral Mucosa Clear, moist. Cardiovascular: S1 and S2 Present, no Murmur, Respiratory: normal respiratory effort, Bilateral Air entry present and no Crackles, no wheezes Abdomen: Bowel Sound present, Soft and no tenderness, no hernia Extremities: no Pedal edema, no calf tenderness Neurology: alert and oriented to time, place, and person affect appropriate.  Filed Weights   08/11/23 0832  Weight: 79.4 kg   Vitals:   08/12/23 1300 08/12/23 1332  BP: 116/80   Pulse: 86   Resp: 20   Temp:  98.3 F (36.8 C)  SpO2: 98%     DISCHARGE MEDICATION: Allergies as of 08/12/2023   No Known Allergies      Medication List     STOP taking these medications    ondansetron  8 MG tablet Commonly known as: Zofran        TAKE these medications    acetaminophen  650 MG CR tablet Commonly known as: TYLENOL  Take 1,300 mg by mouth every 8 (eight) hours as needed for pain.   aspirin  EC 81 MG tablet Take 1 tablet (81 mg total) by mouth daily. Swallow whole. Start taking on: August 13, 2023   azelastine  0.1 % nasal spray Commonly known as: ASTELIN  Place 1 spray into both nostrils 2 (two) times daily. Use in each nostril as directed   chlorhexidine  0.12 % solution Commonly known as: PERIDEX  USE AS DIRECTED TAKE  15  ML  IN  THE  MOUTH  OR  THROAT  TWICE  DAILY   clopidogrel  75 MG tablet Commonly known as: PLAVIX  Take 1 tablet (75 mg total) by mouth daily for 20 days. Start taking  on: August 13, 2023   dronabinol  5 MG capsule Commonly known as: MARINOL  Take 1 capsule (5 mg total) by mouth 2 (two) times daily before lunch and supper.   gentamicin  ointment 0.1 % Commonly known as: GARAMYCIN  Apply 1 Application topically 2 (two) times daily as needed.   ipratropium 0.03 % nasal spray Commonly known as: ATROVENT  Place 2 sprays into both nostrils 2 (two) times daily.   ketoconazole 2 % shampoo Commonly known as: NIZORAL Apply 1 Application topically 2 (two) times a week.   levothyroxine  125 MCG tablet Commonly known as: SYNTHROID  Take 125 mcg by mouth every morning.   loperamide  2 MG capsule Commonly known as: IMODIUM  Take 2 tabs by mouth with first loose stool, then 1 tab with each additional loose stool as needed. Do not exceed 8 tabs in a 24-hour period   magic mouthwash (multi-ingredient) oral suspension Swish and spit 5-10 mLs by mouth 4 (four) times daily as needed.   metoprolol  tartrate 25 MG tablet Commonly known as: LOPRESSOR  Take 1 tablet (25 mg total) by mouth 2 (two) times daily.   OLANZapine  10 MG tablet Commonly known as: ZYPREXA  Take  0.5-1 tablets (5-10 mg total) by mouth at bedtime as needed (nausea).   pantoprazole  40 MG tablet Commonly known as: PROTONIX  Take 1 tablet (40 mg total) by mouth 2 (two) times daily.   potassium chloride  SA 20 MEQ tablet Commonly known as: KLOR-CON  M Take 2 tablets (40 mEq total) by mouth 2 (two) times daily.   predniSONE  10 MG tablet Commonly known as: DELTASONE  Take 3 tablets (30 mg total) by mouth daily with breakfast. What changed: Another medication with the same name was removed. Continue taking this medication, and follow the directions you see here.   prochlorperazine  10 MG tablet Commonly known as: COMPAZINE  Take 10 mg by mouth every 6 (six) hours as needed.       No Known Allergies Discharge Instructions     Call MD for:   Complete by: As directed    Any focal deficits, any new  neurological changes.  Weakness or numbness.   Call MD for:  difficulty breathing, headache or visual disturbances   Complete by: As directed    Call MD for:  extreme fatigue   Complete by: As directed    Call MD for:  persistant dizziness or light-headedness   Complete by: As directed    Call MD for:  persistant nausea and vomiting   Complete by: As directed    Call MD for:  severe uncontrolled pain   Complete by: As directed    Call MD for:  temperature >100.4   Complete by: As directed    Diet - low sodium heart healthy   Complete by: As directed    Discharge instructions   Complete by: As directed    Follow-up with PCP in 1 week, repeat BMP after 1 week Follow-up with oncologist as per schedule Up with neurology in 1 to 2 weeks   Increase activity slowly   Complete by: As directed        The results of significant diagnostics from this hospitalization (including imaging, microbiology, ancillary and laboratory) are listed below for reference.    Significant Diagnostic Studies: ECHOCARDIOGRAM COMPLETE Result Date: 08/12/2023    ECHOCARDIOGRAM REPORT   Patient Name:   Jonathan [name] [facility] Date of Exam: 08/12/2023 Medical Rec #:  [id number]         Height:       69.0 in Accession #:    [id number]        Weight:       175.0 lb Date of Birth:  1956-05-21         BSA:          1.952 m Patient Age:    67 years          BP:           133/91 mmHg Patient Gender: M                 HR:           76 bpm. Exam Location:  [facility] Procedure: 2D Echo, Cardiac Doppler, Color Doppler and Saline Contrast Bubble            Study Indications:     TIA G45.9  History:         Patient has no prior history of Echocardiogram examinations.                  Risk Factors:Hypertension. DVT.  Sonographer:     Christopher Furnace Referring Phys:  [id number] CORT ONEIDA MANA Diagnosing Phys: Lonni End  MD IMPRESSIONS  1. Left ventricular ejection fraction, by estimation, is 55 to 60%. The left ventricle has normal function. The left  ventricle has no regional wall motion abnormalities. There is mild left ventricular hypertrophy. Left ventricular diastolic parameters are consistent with Grade I diastolic dysfunction (impaired relaxation).  2. Right ventricular systolic function is normal. The right ventricular size is normal. Tricuspid regurgitation signal is inadequate for assessing PA pressure.  3. The mitral valve is normal in structure. No evidence of mitral valve regurgitation. No evidence of mitral stenosis.  4. The aortic valve is tricuspid. Aortic valve regurgitation is not visualized. No aortic stenosis is present.  5. Agitated saline contrast bubble study was positive with shunting observed within 3-6 cardiac cycles suggestive of interatrial shunt. FINDINGS  Left Ventricle: Left ventricular ejection fraction, by estimation, is 55 to 60%. The left ventricle has normal function. The left ventricle has no regional wall motion abnormalities. The left ventricular internal cavity size was normal in size. There is  mild left ventricular hypertrophy. Left ventricular diastolic parameters are consistent with Grade I diastolic dysfunction (impaired relaxation). Right Ventricle: The right ventricular size is normal. No increase in right ventricular wall thickness. Right ventricular systolic function is normal. Tricuspid regurgitation signal is inadequate for assessing PA pressure. Left Atrium: Left atrial size was normal in size. Right Atrium: Right atrial size was normal in size. Pericardium: The pericardium was not well visualized. Mitral Valve: The mitral valve is normal in structure. No evidence of mitral valve regurgitation. No evidence of mitral valve stenosis. MV peak gradient, 3.9 mmHg. The mean mitral valve gradient is 1.0 mmHg. Tricuspid Valve: The tricuspid valve is normal in structure. Tricuspid valve regurgitation is trivial. Aortic Valve: The aortic valve is tricuspid. Aortic valve regurgitation is not visualized. No aortic stenosis  is present. Aortic valve mean gradient measures 2.0 mmHg. Aortic valve peak gradient measures 2.9 mmHg. Aortic valve area, by VTI measures 4.62 cm. Pulmonic Valve: The pulmonic valve was not well visualized. Pulmonic valve regurgitation is trivial. No evidence of pulmonic stenosis. Aorta: The aortic root is normal in size and structure. Pulmonary Artery: The pulmonary artery is not well seen. Venous: The inferior vena cava was not well visualized. IAS/Shunts: The interatrial septum was not well visualized. Agitated saline contrast was given intravenously to evaluate for intracardiac shunting. Agitated saline contrast bubble study was positive with shunting observed within 3-6 cardiac cycles suggestive of interatrial shunt.  LEFT VENTRICLE PLAX 2D LVIDd:         3.70 cm   Diastology LVIDs:         2.60 cm   LV e' medial:    6.20 cm/s LV PW:         1.30 cm   LV E/e' medial:  11.0 LV IVS:        1.20 cm   LV e' lateral:   11.70 cm/s LVOT diam:     2.30 cm   LV E/e' lateral: 5.8 LV SV:         71 LV SV Index:   36 LVOT Area:     4.15 cm  RIGHT VENTRICLE RV Basal diam:  2.60 cm RV Mid diam:    2.60 cm LEFT ATRIUM             Index       RIGHT ATRIUM          Index LA diam:        1.80 cm 0.92 cm/m  RA  Area:     8.90 cm LA Vol (A2C):   12.6 ml 6.45 ml/m  RA Volume:   14.30 ml 7.33 ml/m LA Vol (A4C):   10.2 ml 5.23 ml/m LA Biplane Vol: 12.2 ml 6.25 ml/m  AORTIC VALVE AV Area (Vmax):    3.56 cm AV Area (Vmean):   3.55 cm AV Area (VTI):     4.62 cm AV Vmax:           85.65 cm/s AV Vmean:          59.750 cm/s AV VTI:            0.153 m AV Peak Grad:      2.9 mmHg AV Mean Grad:      2.0 mmHg LVOT Vmax:         73.40 cm/s LVOT Vmean:        51.100 cm/s LVOT VTI:          0.170 m LVOT/AV VTI ratio: 1.11  AORTA Ao Root diam: 3.40 cm MITRAL VALVE MV Area (PHT): 3.23 cm    SHUNTS MV Area VTI:   3.51 cm    Systemic VTI:  0.17 m MV Peak grad:  3.9 mmHg    Systemic Diam: 2.30 cm MV Mean grad:  1.0 mmHg MV Vmax:        0.98 m/s MV Vmean:      52.6 cm/s MV Decel Time: 235 msec MV E velocity: 68.10 cm/s MV A velocity: 95.40 cm/s MV E/A ratio:  0.71 Lonni End MD Electronically signed by Lonni Hanson MD Signature Date/Time: 08/12/2023/10:12:56 AM    Final    MR BRAIN WO CONTRAST Result Date: 08/11/2023 CLINICAL DATA:  TIA. Episode of slurred speech, left facial droop and left upper extremity weakness lasting approximately 1 minute. EXAM: MRI HEAD WITHOUT CONTRAST TECHNIQUE: Multiplanar, multiecho pulse sequences of the brain and surrounding structures were obtained without intravenous contrast. COMPARISON:  CT head without contrast 08/11/2023. MR head without and with contrast 06/04/2023. FINDINGS: Brain: No acute infarct, hemorrhage, or mass lesion is present. Scattered subcortical T2 hyperintensities are stable and likely within normal limits for age. The ventricles are of normal size. No significant extraaxial fluid collection is present. Deep brain nuclei are within normal limits. The brainstem and cerebellum are within normal limits. Midline structures are within normal limits. The internal auditory canals are within normal limits. Vascular: Flow is present in the major intracranial arteries. The globes and orbits are within normal limits. Skull and upper cervical spine: Degenerative changes are present at the C3-4 disc level. At least partial effacement of ventral CSF is present. The craniocervical junction is normal. Marrow signal is normal. Sinuses/Orbits: The paranasal sinuses and mastoid air cells are clear. The globes and orbits are within normal limits. IMPRESSION: 1. Normal MRI appearance of the brain for age. No acute or focal lesion to explain the patient's symptoms. 2. Degenerative changes at the C3-4 disc level with at least partial effacement of ventral CSF. Electronically Signed   By: Lonni Necessary M.D.   On: 08/11/2023 18:39   CT ANGIO HEAD NECK W WO CM Result Date: 08/11/2023 CLINICAL DATA:   Episode of slurred speech, left-sided facial droop and left upper extremity weakness lasting 1 minute at 8 o'clock a.m. today at the cancer center. Esophageal cancer. EXAM: CT ANGIOGRAPHY HEAD AND NECK WITH AND WITHOUT CONTRAST TECHNIQUE: Multidetector CT imaging of the head and neck was performed using the standard protocol during bolus administration of intravenous contrast.  Multiplanar CT image reconstructions and MIPs were obtained to evaluate the vascular anatomy. Carotid stenosis measurements (when applicable) are obtained utilizing NASCET criteria, using the distal internal carotid diameter as the denominator. RADIATION DOSE REDUCTION: This exam was performed according to the departmental dose-optimization program which includes automated exposure control, adjustment of the mA and/or kV according to patient size and/or use of iterative reconstruction technique. CONTRAST:  75mL OMNIPAQUE  IOHEXOL  350 MG/ML SOLN COMPARISON:  CT head without contrast 08/11/2023 FINDINGS: CTA NECK FINDINGS Aortic arch: Atherosclerotic calcifications are present at the distal aortic arch. No stenosis or aneurysm is present. No significant changes are present at the great vessel origins. A 3 vessel arch configuration is present. No dissection is present. Right carotid system: The right common carotid artery is mildly tortuous without focal stenosis. Bifurcation is within normal limits. Moderate tortuosity is present cervical right ICA without significant stenosis. Left carotid system: The left common carotid artery is within normal limits. Atherosclerotic calcifications are present at bifurcation without significant stenosis relative to the more distal vessel. The cervical left ICA is normal. Vertebral arteries: The right vertebral artery is the dominant vessel. Both vertebral arteries originate from the subclavian arteries without significant stenosis. No significant stenosis is present in either vertebral artery in the neck.  Skeleton: Multilevel degenerative changes are present cervical spine. No focal osseous lesions are present. Uncovertebral spurring contributes to foraminal narrowing bilaterally at C4-5, C5-6 and C6-7. Other neck: Soft tissues the neck are otherwise unremarkable. Salivary glands are within normal limits. Thyroid  is normal. No significant adenopathy is present. No focal mucosal or submucosal lesions are present. A right IJ Port-A-Cath is in place. Upper chest: The lung apices are clear. The thoracic inlet is within normal limits. No significant pleural effusion or pneumothorax is present. Review of the MIP images confirms the above findings CTA HEAD FINDINGS Anterior circulation: Atherosclerotic calcifications are present within the cavernous internal carotid arteries. No significant stenosis is present. The ICA termini are normal bilaterally. The A1 and M1 segments are normal. The MCA bifurcations are normal bilaterally. ACA and MCA branch vessels are within normal limits. Posterior circulation: PICA origins are visualized and normal. Vertebrobasilar junction basilar artery normal. The superior cerebellar arteries are patent. Both posterior cerebral arteries originate from basilar tip. A left posterior communicating artery contributes. The is PCA branch vessels are within normal limits. Venous sinuses: The dural sinuses are patent. The straight sinus and deep cerebral veins are intact. Cortical veins are within normal limits. No significant vascular malformation is evident. Anatomic variants: None Review of the MIP images confirms the above findings IMPRESSION: 1. No emergent large vessel occlusion. 2. Atherosclerotic changes at the left carotid bifurcation and cavernous internal carotid arteries without significant stenosis relative to the more distal vessels. 3. Multilevel degenerative changes of the cervical spine. 4.  Aortic Atherosclerosis (ICD10-I70.0). Electronically Signed   By: Lonni Necessary M.D.    On: 08/11/2023 18:37   CT HEAD WO CONTRAST Result Date: 08/11/2023 CLINICAL DATA:  Provided history: Neuro deficit, acute, stroke suspected. Slurred speech, left-sided facial droop, left arm weakness. Patient reports deficits have now resolved. EXAM: CT HEAD WITHOUT CONTRAST TECHNIQUE: Contiguous axial images were obtained from the base of the skull through the vertex without intravenous contrast. RADIATION DOSE REDUCTION: This exam was performed according to the departmental dose-optimization program which includes automated exposure control, adjustment of the mA and/or kV according to patient size and/or use of iterative reconstruction technique. COMPARISON:  Brain MRI 06/04/2023. FINDINGS: Brain: Mild generalized  parenchymal atrophy. There is no acute intracranial hemorrhage. No demarcated cortical infarct. No extra-axial fluid collection. No evidence of an intracranial mass. No midline shift. Vascular: No hyperdense vessel.  Atherosclerotic calcifications. Skull: No calvarial fracture or aggressive osseous lesion. Sinuses/Orbits: No mass or acute finding within the imaged orbits. No significant paranasal sinus disease at the imaged levels. Other: Trace fluid within the ethmoid air cells. IMPRESSION: 1. No evidence of an acute intracranial abnormality. 2. Mild generalized parenchymal atrophy. Electronically Signed   By: Rockey Childs D.O.   On: 08/11/2023 09:04   MR Lumbar Spine W Wo Contrast Result Date: 07/29/2023 CLINICAL DATA:  Esophageal cancer being treated with chemotherapy. New weakness in both legs. EXAM: MRI THORACIC AND LUMBAR SPINE WITHOUT AND WITH CONTRAST TECHNIQUE: Multiplanar and multiecho pulse sequences of the thoracic and lumbar spine were obtained without and with intravenous contrast. CONTRAST:  7.5mL GADAVIST  GADOBUTROL  1 MMOL/ML IV SOLN COMPARISON:  Head CT 05/21/2023 and body CT 06/19/2023 FINDINGS: MRI THORACIC SPINE FINDINGS Alignment:  Mild degenerative anterolisthesis at T2-3.  Vertebrae: Accentuated fatty marrow at T4-T10, likely related to esophageal radiation. Small lesion in the T12 body, compatible with metastatic disease when compared to prior PET-CT. No underlying fracture. Small lesions in the T9 and T10 vertebral body could be additional metastatic deposits. No extraosseous tumor extension. Cord:  Normal signal and morphology. Paraspinal and other soft tissues: Known liver and right adrenal masses. Disc levels: Generalized disc narrowing and desiccation with endplate and facet spurring. MRI LUMBAR SPINE FINDINGS Segmentation:  Standard. Alignment:  Slight L4-5 anterolisthesis.  Dextroscoliosis. Vertebrae: Infiltrative bone marrow signal abnormality at L2, correlating with PET CT. No extraosseous tumor or insufficiency fracture. No incidental bone infection. Conus medullaris: Extends to the T12-L1 level and appears normal. Normal appearance of the cauda equina . Paraspinal and other soft tissues: Known from visceral imaging. Disc levels: T12- L1: Spondylosis.  No neural impingement L1-L2: Disc height loss and spondylosis.  No neural impingement L2-L3: Disc narrowing and bulging with biforaminal protrusion. Mild facet spurring. L3-L4: Disc narrowing and circumferential bulging with central protrusion. Degenerative facet spurring on both sides. Mild triangular narrowing of the thecal sac. L4-L5: Degenerative facet spurring which is bulky. Mild anterolisthesis. The disc is narrowed and bulging with moderate left subarticular recess stenosis. The foramina are patent L5-S1:Disc narrowing and bulging with right inferior foraminal protrusion. Degenerative facet spurring on both sides. Mild right subarticular recess and foraminal narrowing. IMPRESSION: Thoracic spine: Limited metastatic disease seen at the T9, T10, and T12 bodies. No extraosseous tumor or cord signal abnormality to correlate with weakness. Lumbar spine: 1. L2 vertebral body metastasis, known from prior PET-CT. No  extra-osseous extension, fracture, or other explanation for weakness. 2. Generalized lumbar spine degeneration as described. Diffusely patent spinal canal. Electronically Signed   By: Dorn Roulette M.D.   On: 07/29/2023 08:57   MR Thoracic Spine W Wo Contrast Result Date: 07/29/2023 CLINICAL DATA:  Esophageal cancer being treated with chemotherapy. New weakness in both legs. EXAM: MRI THORACIC AND LUMBAR SPINE WITHOUT AND WITH CONTRAST TECHNIQUE: Multiplanar and multiecho pulse sequences of the thoracic and lumbar spine were obtained without and with intravenous contrast. CONTRAST:  7.5mL GADAVIST  GADOBUTROL  1 MMOL/ML IV SOLN COMPARISON:  Head CT 05/21/2023 and body CT 06/19/2023 FINDINGS: MRI THORACIC SPINE FINDINGS Alignment:  Mild degenerative anterolisthesis at T2-3. Vertebrae: Accentuated fatty marrow at T4-T10, likely related to esophageal radiation. Small lesion in the T12 body, compatible with metastatic disease when compared to prior PET-CT. No underlying  fracture. Small lesions in the T9 and T10 vertebral body could be additional metastatic deposits. No extraosseous tumor extension. Cord:  Normal signal and morphology. Paraspinal and other soft tissues: Known liver and right adrenal masses. Disc levels: Generalized disc narrowing and desiccation with endplate and facet spurring. MRI LUMBAR SPINE FINDINGS Segmentation:  Standard. Alignment:  Slight L4-5 anterolisthesis.  Dextroscoliosis. Vertebrae: Infiltrative bone marrow signal abnormality at L2, correlating with PET CT. No extraosseous tumor or insufficiency fracture. No incidental bone infection. Conus medullaris: Extends to the T12-L1 level and appears normal. Normal appearance of the cauda equina . Paraspinal and other soft tissues: Known from visceral imaging. Disc levels: T12- L1: Spondylosis.  No neural impingement L1-L2: Disc height loss and spondylosis.  No neural impingement L2-L3: Disc narrowing and bulging with biforaminal protrusion.  Mild facet spurring. L3-L4: Disc narrowing and circumferential bulging with central protrusion. Degenerative facet spurring on both sides. Mild triangular narrowing of the thecal sac. L4-L5: Degenerative facet spurring which is bulky. Mild anterolisthesis. The disc is narrowed and bulging with moderate left subarticular recess stenosis. The foramina are patent L5-S1:Disc narrowing and bulging with right inferior foraminal protrusion. Degenerative facet spurring on both sides. Mild right subarticular recess and foraminal narrowing. IMPRESSION: Thoracic spine: Limited metastatic disease seen at the T9, T10, and T12 bodies. No extraosseous tumor or cord signal abnormality to correlate with weakness. Lumbar spine: 1. L2 vertebral body metastasis, known from prior PET-CT. No extra-osseous extension, fracture, or other explanation for weakness. 2. Generalized lumbar spine degeneration as described. Diffusely patent spinal canal. Electronically Signed   By: Dorn Roulette M.D.   On: 07/29/2023 08:57    Microbiology: No results found for this or any previous visit (from the past 240 hours).   Labs: CBC: Recent Labs  Lab 08/11/23 0806 08/11/23 0835  WBC 6.9 6.5  NEUTROABS 5.6 5.4  HGB 12.3* 12.3*  HCT 39.1 39.3  MCV 87.3 89.5  PLT 229 234   Basic Metabolic Panel: Recent Labs  Lab 08/11/23 0806 08/11/23 0835 08/12/23 0603  NA 138 137 141  K 3.1* 2.9* 3.8  CL 102 102 106  CO2 25 22 26   GLUCOSE 83 84 81  BUN 12 13 11   CREATININE 0.85 0.87 0.80  CALCIUM  9.2 9.0 9.0  MG  --  2.1  --    Liver Function Tests: Recent Labs  Lab 08/11/23 0806 08/11/23 0835  AST 23 17  ALT 21 22  ALKPHOS 90 89  BILITOT 0.7 0.9  PROT 6.7 6.8  ALBUMIN 3.4* 3.5   No results for input(s): LIPASE, AMYLASE in the last 168 hours. No results for input(s): AMMONIA in the last 168 hours. Cardiac Enzymes: No results for input(s): CKTOTAL, CKMB, CKMBINDEX, TROPONINI in the last 168 hours. BNP (last  3 results) No results for input(s): BNP in the last 8760 hours. CBG: Recent Labs  Lab 08/11/23 0833  GLUCAP 73    Time spent: 35 minutes  Signed:  Elvan Sor  [facility] 08/12/2023 2:58 PM

## 2023-08-12 NOTE — Progress Notes (Signed)
 SLP Cancellation Note  Patient Details Name: Jonathan [name] MRN: [id number] DOB: 10-Mar-1956   Cancelled treatment:       Reason Eval/Treat Not Completed: SLP screened, no needs identified, will sign off (chart reviewed; consulted NSG then met w/ pt in room.)  Pt denied any difficulty swallowing(some breakfast meal eaten) and is currently on a regular diet; tolerates swallowing pills w/ water  per NSG. Pt c/o increased saliva in his mouth since being on my chemotherapy tx; discussed strategies to manage saliva including drinking increased water  vs caffeine drinks.  Pt conversed in conversation w/out expressive/receptive deficits noted; pt denied any new speech-language deficits. Speech fully intelligible - noted increased saliva impacting at times until he expectorated it or swallowed to clear(Baseline). No further skilled ST services indicated as pt appears at his baseline. Pt agreed. NSG to reconsult if any change in status while admitted.      [name], MS, CCC-SLP Speech Language Pathologist Rehab Services; [facility] [phone] (ascom) Jonathan [name] 08/12/2023, 10:02 AM

## 2023-08-13 ENCOUNTER — Inpatient Hospital Stay: Payer: No Typology Code available for payment source

## 2023-08-15 ENCOUNTER — Encounter: Payer: Self-pay | Admitting: Oncology

## 2023-08-15 DIAGNOSIS — G459 Transient cerebral ischemic attack, unspecified: Secondary | ICD-10-CM | POA: Diagnosis not present

## 2023-08-15 DIAGNOSIS — Z008 Encounter for other general examination: Secondary | ICD-10-CM | POA: Diagnosis not present

## 2023-08-15 DIAGNOSIS — C159 Malignant neoplasm of esophagus, unspecified: Secondary | ICD-10-CM | POA: Diagnosis not present

## 2023-08-15 DIAGNOSIS — G4733 Obstructive sleep apnea (adult) (pediatric): Secondary | ICD-10-CM | POA: Diagnosis not present

## 2023-08-18 ENCOUNTER — Encounter: Payer: Self-pay | Admitting: Radiation Oncology

## 2023-08-18 ENCOUNTER — Ambulatory Visit
Admission: RE | Admit: 2023-08-18 | Discharge: 2023-08-18 | Disposition: A | Discharge status: Home / Self Care | Payer: No Typology Code available for payment source | Source: Ambulatory Visit | Attending: Radiation Oncology | Admitting: Radiation Oncology

## 2023-08-18 VITALS — BP 117/92 | HR 90 | Temp 96.8°F | Resp 18 | Ht 69.0 in | Wt 166.8 lb

## 2023-08-18 DIAGNOSIS — M9902 Segmental and somatic dysfunction of thoracic region: Secondary | ICD-10-CM | POA: Diagnosis not present

## 2023-08-18 DIAGNOSIS — C155 Malignant neoplasm of lower third of esophagus: Secondary | ICD-10-CM | POA: Diagnosis not present

## 2023-08-18 DIAGNOSIS — M62830 Muscle spasm of back: Secondary | ICD-10-CM | POA: Diagnosis not present

## 2023-08-18 DIAGNOSIS — M9903 Segmental and somatic dysfunction of lumbar region: Secondary | ICD-10-CM | POA: Diagnosis not present

## 2023-08-18 DIAGNOSIS — M9901 Segmental and somatic dysfunction of cervical region: Secondary | ICD-10-CM | POA: Diagnosis not present

## 2023-08-18 NOTE — Progress Notes (Signed)
Radiation Oncology Follow up Note  Name: Jonathan [name]   Date:   08/18/2023 MRN:  [id number] DOB: Feb 16, 1956    This 68 y.o. 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  Previously treated with FOLFOX chemotherapy we treated him for palliation for continued bleeding from his esophageal primary.  PET scan showed progressive hypermetabolic activity in the distal esophagus.  He also has known T12 and L2 vertebral body lesions and lesions of the left sacrum he is having no significant back pain although though does see a chiropractor.Marland Kitchen  He was currently being treated with FOLFIRI although recently he had during one of his infusions numbness of his left upper extremity probably a TIA although MRI scan of his brain was negative.  That has resolved he specifically denies any dysphagia at this time or any further bleeding or black tarry stools.  COMPLICATIONS OF TREATMENT: none  FOLLOW UP COMPLIANCE: keeps appointments   PHYSICAL EXAM:  BP (!) 117/92   Pulse 90   Temp (!) 96.8 F (36 C) (Tympanic)   Resp 18   Ht 5\' 9"  (1.753 m)   Wt 166 lb 12.8 oz (75.7 kg)   BMI 24.63 kg/m  Well-developed well-nourished patient in NAD. HEENT reveals PERLA, EOMI, discs not visualized.  Oral cavity is clear. No oral mucosal lesions are identified. Neck is clear without evidence of cervical or supraclavicular adenopathy. Lungs are clear to A&P. Cardiac examination is essentially unremarkable with regular rate and rhythm without murmur rub or thrill. Abdomen is benign with no organomegaly or masses noted. Motor sensory and DTR levels are equal and symmetric in the upper and lower extremities. Cranial nerves II through XII are grossly intact.  Proprioception is intact. No peripheral adenopathy or edema is identified. No motor or sensory levels are noted. Crude visual fields are within normal range.  RADIOLOGY RESULTS: No current films for review  PLAN: Present time patient will continue medical oncology treatment.  I left open the door for possible palliative radiation therapy to especially his bony metastasis although that does not seem to be a significant problem at this time.  I will turn follow-up care over to medical oncology be happy to reevaluate her for any further palliative treatment.  Patient is to call with any concerns.  I would like to take this opportunity to thank you for allowing me to participate in the care of your patient.Carmina Miller, MD

## 2023-08-19 ENCOUNTER — Inpatient Hospital Stay: Payer: No Typology Code available for payment source

## 2023-08-19 ENCOUNTER — Other Ambulatory Visit: Payer: Self-pay

## 2023-08-19 ENCOUNTER — Encounter: Payer: Self-pay | Admitting: Oncology

## 2023-08-19 ENCOUNTER — Inpatient Hospital Stay (HOSPITAL_BASED_OUTPATIENT_CLINIC_OR_DEPARTMENT_OTHER): Payer: No Typology Code available for payment source | Admitting: Oncology

## 2023-08-19 VITALS — BP 122/92 | HR 98 | Temp 96.0°F | Resp 18 | Wt 170.2 lb

## 2023-08-19 DIAGNOSIS — G459 Transient cerebral ischemic attack, unspecified: Secondary | ICD-10-CM

## 2023-08-19 DIAGNOSIS — R634 Abnormal weight loss: Secondary | ICD-10-CM | POA: Diagnosis not present

## 2023-08-19 DIAGNOSIS — Z5111 Encounter for antineoplastic chemotherapy: Secondary | ICD-10-CM | POA: Diagnosis not present

## 2023-08-19 DIAGNOSIS — R29898 Other symptoms and signs involving the musculoskeletal system: Secondary | ICD-10-CM | POA: Diagnosis not present

## 2023-08-19 DIAGNOSIS — I82621 Acute embolism and thrombosis of deep veins of right upper extremity: Secondary | ICD-10-CM | POA: Diagnosis not present

## 2023-08-19 DIAGNOSIS — C159 Malignant neoplasm of esophagus, unspecified: Secondary | ICD-10-CM

## 2023-08-19 DIAGNOSIS — R29818 Other symptoms and signs involving the nervous system: Secondary | ICD-10-CM

## 2023-08-19 DIAGNOSIS — G620 Drug-induced polyneuropathy: Secondary | ICD-10-CM

## 2023-08-19 DIAGNOSIS — J984 Other disorders of lung: Secondary | ICD-10-CM | POA: Diagnosis not present

## 2023-08-19 DIAGNOSIS — T451X5A Adverse effect of antineoplastic and immunosuppressive drugs, initial encounter: Secondary | ICD-10-CM

## 2023-08-19 DIAGNOSIS — C155 Malignant neoplasm of lower third of esophagus: Secondary | ICD-10-CM | POA: Diagnosis not present

## 2023-08-19 DIAGNOSIS — E876 Hypokalemia: Secondary | ICD-10-CM

## 2023-08-19 LAB — CMP (CANCER CENTER ONLY)
ALT: 22 U/L (ref 0–44)
AST: 30 U/L (ref 15–41)
Albumin: 3.4 g/dL — ABNORMAL LOW (ref 3.5–5.0)
Alkaline Phosphatase: 105 U/L (ref 38–126)
Anion gap: 12 (ref 5–15)
BUN: 14 mg/dL (ref 8–23)
CO2: 23 mmol/L (ref 22–32)
Calcium: 9.2 mg/dL (ref 8.9–10.3)
Chloride: 100 mmol/L (ref 98–111)
Creatinine: 0.94 mg/dL (ref 0.61–1.24)
GFR, Estimated: >60 mL/min (ref >60)
Glucose, Bld: 96 mg/dL (ref 70–99)
Potassium: 3 mmol/L — ABNORMAL LOW (ref 3.5–5.1)
Sodium: 135 mmol/L (ref 135–145)
Total Bilirubin: 0.5 mg/dL (ref 0.0–1.2)
Total Protein: 7.2 g/dL (ref 6.5–8.1)

## 2023-08-19 LAB — CBC WITH DIFFERENTIAL (CANCER CENTER ONLY)
Abs Immature Granulocytes: 0.18 10*3/uL — ABNORMAL HIGH (ref 0.00–0.07)
Basophils Absolute: 0 10*3/uL (ref 0.0–0.1)
Basophils Relative: 0 %
Eosinophils Absolute: 0 10*3/uL (ref 0.0–0.5)
Eosinophils Relative: 0 %
HCT: 40.3 % (ref 39.0–52.0)
Hemoglobin: 12.8 g/dL — ABNORMAL LOW (ref 13.0–17.0)
Immature Granulocytes: 2 %
Lymphocytes Relative: 12 %
Lymphs Abs: 1.1 10*3/uL (ref 0.7–4.0)
MCH: 27.5 pg (ref 26.0–34.0)
MCHC: 31.8 g/dL (ref 30.0–36.0)
MCV: 86.7 fL (ref 80.0–100.0)
Monocytes Absolute: 1 10*3/uL (ref 0.1–1.0)
Monocytes Relative: 11 %
Neutro Abs: 7.2 10*3/uL (ref 1.7–7.7)
Neutrophils Relative %: 75 %
Platelet Count: 303 10*3/uL (ref 150–400)
RBC: 4.65 MIL/uL (ref 4.22–5.81)
RDW: 16.8 % — ABNORMAL HIGH (ref 11.5–15.5)
WBC Count: 9.5 10*3/uL (ref 4.0–10.5)
nRBC: 0 % (ref 0.0–0.2)

## 2023-08-19 LAB — MAGNESIUM: Magnesium: 1.9 mg/dL (ref 1.7–2.4)

## 2023-08-19 MED ORDER — PREDNISONE 10 MG PO TABS
20.0000 mg | ORAL_TABLET | Freq: Every day | ORAL | 0 refills | Status: DC
  Filled 2023-08-19: qty 60, 30d supply, fill #0

## 2023-08-19 MED ORDER — SODIUM CHLORIDE 0.9 % IV SOLN
100.0000 mg/m2 | Freq: Once | INTRAVENOUS | Status: AC
  Administered 2023-08-19: 200 mg via INTRAVENOUS
  Filled 2023-08-19: qty 10

## 2023-08-19 MED ORDER — ATROPINE SULFATE 1 MG/ML IV SOLN
0.5000 mg | Freq: Once | INTRAVENOUS | Status: AC
  Administered 2023-08-19: 0.5 mg via INTRAVENOUS
  Filled 2023-08-19: qty 1

## 2023-08-19 MED ORDER — PREDNISONE 10 MG PO TABS: 20.0000 mg | ORAL_TABLET | Freq: Every day | ORAL | 0 refills | Status: DC

## 2023-08-19 MED ORDER — SODIUM CHLORIDE 0.9 % IV SOLN
400.0000 mg/m2 | Freq: Once | INTRAVENOUS | Status: AC
  Administered 2023-08-19: 796 mg via INTRAVENOUS
  Filled 2023-08-19: qty 39.8

## 2023-08-19 MED ORDER — POTASSIUM CHLORIDE 20 MEQ/100ML IV SOLN
20.0000 meq | Freq: Once | INTRAVENOUS | Status: AC
  Administered 2023-08-19: 20 meq via INTRAVENOUS

## 2023-08-19 MED ORDER — SODIUM CHLORIDE 0.9 % IV SOLN
INTRAVENOUS | Status: DC
  Filled 2023-08-19: qty 250

## 2023-08-19 MED ORDER — PALONOSETRON HCL INJECTION 0.25 MG/5ML
0.2500 mg | Freq: Once | INTRAVENOUS | Status: AC
  Administered 2023-08-19: 0.25 mg via INTRAVENOUS
  Filled 2023-08-19: qty 5

## 2023-08-19 MED ORDER — DEXAMETHASONE SODIUM PHOSPHATE 10 MG/ML IJ SOLN
10.0000 mg | Freq: Once | INTRAMUSCULAR | Status: AC
  Administered 2023-08-19: 10 mg via INTRAVENOUS
  Filled 2023-08-19: qty 1

## 2023-08-19 MED ORDER — SODIUM CHLORIDE 0.9 % IV SOLN
2400.0000 mg/m2 | INTRAVENOUS | Status: DC
  Administered 2023-08-19: 5000 mg via INTRAVENOUS
  Filled 2023-08-19: qty 100

## 2023-08-19 MED ORDER — PANTOPRAZOLE SODIUM 40 MG PO TBEC
40.0000 mg | DELAYED_RELEASE_TABLET | Freq: Two times a day (BID) | ORAL | 3 refills | Status: DC
  Filled 2023-08-19: qty 60, 30d supply, fill #0

## 2023-08-19 NOTE — Assessment & Plan Note (Signed)
Chronic hypokalemia, normal magnesia level.  Cotinue KCL to BID.  K is 3 today, will give him IV KCL over 1 hour.

## 2023-08-19 NOTE — Patient Instructions (Signed)
[facility] - A DEPT OF [facility]  Discharge Instructions: Thank you for choosing [facility] to provide your oncology and hematology care.  If you have a lab appointment with the Cancer Center, please go directly to the Cancer Center and check in at the registration area.  Wear comfortable clothing and clothing appropriate for easy access to any Portacath or PICC line.   We strive to give you quality time with your provider. You may need to reschedule your appointment if you arrive late (15 or more minutes).  Arriving late affects you and other patients whose appointments are after yours.  Also, if you miss three or more appointments without notifying the office, you may be dismissed from the clinic at the provider's discretion.      For prescription refill requests, have your pharmacy contact our office and allow 72 hours for refills to be completed.    Today you received the following chemotherapy and/or immunotherapy agents IRINOTECAN. LEUCOVORIN, 5 FU      To help prevent nausea and vomiting after your treatment, we encourage you to take your nausea medication as directed.  BELOW ARE SYMPTOMS THAT SHOULD BE REPORTED IMMEDIATELY: *FEVER GREATER THAN 100.4 F (38 C) OR HIGHER *CHILLS OR SWEATING *NAUSEA AND VOMITING THAT IS NOT CONTROLLED WITH YOUR NAUSEA MEDICATION *UNUSUAL SHORTNESS OF BREATH *UNUSUAL BRUISING OR BLEEDING *URINARY PROBLEMS (pain or burning when urinating, or frequent urination) *BOWEL PROBLEMS (unusual diarrhea, constipation, pain near the anus) TENDERNESS IN MOUTH AND THROAT WITH OR WITHOUT PRESENCE OF ULCERS (sore throat, sores in mouth, or a toothache) UNUSUAL RASH, SWELLING OR PAIN  UNUSUAL VAGINAL DISCHARGE OR ITCHING   Items with * indicate a potential emergency and should be followed up as soon as possible or go to the Emergency Department if any problems should occur.  Please show the CHEMOTHERAPY ALERT CARD  or IMMUNOTHERAPY ALERT CARD at check-in to the Emergency Department and triage nurse.  Should you have questions after your visit or need to cancel or reschedule your appointment, please contact [facility] - A DEPT OF [facility]  [phone] and follow the prompts.  Office hours are 8:00 a.m. to 4:30 p.m. Monday - Friday. Please note that voicemails left after 4:00 p.m. may not be returned until the following business day.  We are closed weekends and major holidays. You have access to a nurse at all times for urgent questions. Please call the main number to the clinic [phone] and follow the prompts.  For any non-urgent questions, you may also contact your provider using MyChart. We now offer e-Visits for anyone 83 and older to request care online for non-urgent symptoms. For details visit [email or web address].   Also download the MyChart app! Go to the app store, search "MyChart", open the app, select [place], and log in with your MyChart username and password.  Irinotecan Injection What is this medication? IRINOTECAN (ir in oh TEE kan) treats some types of cancer. It works by slowing down the growth of cancer cells. This medicine may be used for other purposes; ask your health care provider or pharmacist if you have questions. COMMON BRAND NAME(S): Camptosar What should I tell my care team before I take this medication? They need to know if you have any of these conditions: Dehydration Diarrhea Infection, especially a viral infection, such as chickenpox, cold sores, herpes Liver disease Low blood cell levels (white cells, red  cells, and platelets) Low levels of electrolytes, such as calcium, magnesium, or potassium in your blood Recent or ongoing radiation An unusual or allergic reaction to irinotecan, other medications, foods, dyes, or preservatives If you or your partner are pregnant or trying to get pregnant Breast-feeding How should I use  this medication? This medication is injected into a vein. It is given by your care team in a hospital or clinic setting. Talk to your care team about the use of this medication in children. Special care may be needed. Overdosage: If you think you have taken too much of this medicine contact a poison control center or emergency room at once. NOTE: This medicine is only for you. Do not share this medicine with others. What if I miss a dose? Keep appointments for follow-up doses. It is important not to miss your dose. Call your care team if you are unable to keep an appointment. What may interact with this medication? Do not take this medication with any of the following: Cobicistat Itraconazole This medication may also interact with the following: Certain antibiotics, such as clarithromycin, rifampin, rifabutin Certain antivirals for HIV or AIDS Certain medications for fungal infections, such as ketoconazole, posaconazole, voriconazole Certain medications for seizures, such as carbamazepine, phenobarbital, phenytoin Gemfibrozil Nefazodone St. John's wort This list may not describe all possible interactions. Give your health care provider a list of all the medicines, herbs, non-prescription drugs, or dietary supplements you use. Also tell them if you smoke, drink alcohol, or use illegal drugs. Some items may interact with your medicine. What should I watch for while using this medication? Your condition will be monitored carefully while you are receiving this medication. You may need blood work while taking this medication. This medication may make you feel generally unwell. This is not uncommon as chemotherapy can affect healthy cells as well as cancer cells. Report any side effects. Continue your course of treatment even though you feel ill unless your care team tells you to stop. This medication can cause serious side effects. To reduce the risk, your care team may give you other medications  to take before receiving this one. Be sure to follow the directions from your care team. This medication may affect your coordination, reaction time, or judgement. Do not drive or operate machinery until you know how this medication affects you. Sit up or stand slowly to reduce the risk of dizzy or fainting spells. Drinking alcohol with this medication can increase the risk of these side effects. This medication may increase your risk of getting an infection. Call your care team for advice if you get a fever, chills, sore throat, or other symptoms of a cold or flu. Do not treat yourself. Try to avoid being around people who are sick. Avoid taking medications that contain aspirin, acetaminophen, ibuprofen, naproxen, or ketoprofen unless instructed by your care team. These medications may hide a fever. This medication may increase your risk to bruise or bleed. Call your care team if you notice any unusual bleeding. Be careful brushing or flossing your teeth or using a toothpick because you may get an infection or bleed more easily. If you have any dental work done, tell your dentist you are receiving this medication. Talk to your care team if you or your partner are pregnant or think either of you might be pregnant. This medication can cause serious birth defects if taken during pregnancy and for 6 months after the last dose. You will need a negative pregnancy test before  starting this medication. Contraception is recommended while taking this medication and for 6 months after the last dose. Your care team can help you find the option that works for you. Do not father a child while taking this medication and for 3 months after the last dose. Use a condom for contraception during this time period. Do not breastfeed while taking this medication and for 7 days after the last dose. This medication may cause infertility. Talk to your care team if you are concerned about your fertility. What side effects may I  notice from receiving this medication? Side effects that you should report to your care team as soon as possible: Allergic reactions--skin rash, itching, hives, swelling of the face, lips, tongue, or throat Dry cough, shortness of breath or trouble breathing Increased saliva or tears, increased sweating, stomach cramping, diarrhea, small pupils, unusual weakness or fatigue, slow heartbeat Infection--fever, chills, cough, sore throat, wounds that don't heal, pain or trouble when passing urine, general feeling of discomfort or being unwell Kidney injury--decrease in the amount of urine, swelling of the ankles, hands, or feet Low red blood cell level--unusual weakness or fatigue, dizziness, headache, trouble breathing Severe or prolonged diarrhea Unusual bruising or bleeding Side effects that usually do not require medical attention (report to your care team if they continue or are bothersome): Constipation Diarrhea Hair loss Loss of appetite Nausea Stomach pain This list may not describe all possible side effects. Call your doctor for medical advice about side effects. You may report side effects to FDA at 1-800-FDA-1088. Where should I keep my medication? This medication is given in a hospital or clinic. It will not be stored at home. NOTE: This sheet is a summary. It may not cover all possible information. If you have questions about this medicine, talk to your doctor, pharmacist, or health care provider.  2024 Elsevier/Gold Standard (2021-11-26 00:00:00)  Leucovorin Injection What is this medication? LEUCOVORIN (loo koe VOR in) prevents side effects from certain medications, such as methotrexate. It works by increasing folate levels. This helps protect healthy cells in your body. It may also be used to treat anemia caused by low levels of folate. It can also be used with fluorouracil, a type of chemotherapy, to treat colorectal cancer. It works by increasing the effects of fluorouracil in  the body. This medicine may be used for other purposes; ask your health care provider or pharmacist if you have questions. What should I tell my care team before I take this medication? They need to know if you have any of these conditions: Anemia from low levels of vitamin B12 in the blood An unusual or allergic reaction to leucovorin, folic acid, other medications, foods, dyes, or preservatives Pregnant or trying to get pregnant Breastfeeding How should I use this medication? This medication is injected into a vein or a muscle. It is given by your care team in a hospital or clinic setting. Talk to your care team about the use of this medication in children. Special care may be needed. Overdosage: If you think you have taken too much of this medicine contact a poison control center or emergency room at once. NOTE: This medicine is only for you. Do not share this medicine with others. What if I miss a dose? Keep appointments for follow-up doses. It is important not to miss your dose. Call your care team if you are unable to keep an appointment. What may interact with this medication? Capecitabine Fluorouracil Phenobarbital Phenytoin Primidone Trimethoprim;sulfamethoxazole This list  may not describe all possible interactions. Give your health care provider a list of all the medicines, herbs, non-prescription drugs, or dietary supplements you use. Also tell them if you smoke, drink alcohol, or use illegal drugs. Some items may interact with your medicine. What should I watch for while using this medication? Your condition will be monitored carefully while you are receiving this medication. This medication may increase the side effects of 5-fluorouracil. Tell your care team if you have diarrhea or mouth sores that do not get better or that get worse. What side effects may I notice from receiving this medication? Side effects that you should report to your care team as soon as  possible: Allergic reactions--skin rash, itching, hives, swelling of the face, lips, tongue, or throat This list may not describe all possible side effects. Call your doctor for medical advice about side effects. You may report side effects to FDA at 1-800-FDA-1088. Where should I keep my medication? This medication is given in a hospital or clinic. It will not be stored at home. NOTE: This sheet is a summary. It may not cover all possible information. If you have questions about this medicine, talk to your doctor, pharmacist, or health care provider.  2024 Elsevier/Gold Standard (2021-12-18 00:00:00)  Fluorouracil Injection What is this medication? FLUOROURACIL (flure oh YOOR a sil) treats some types of cancer. It works by slowing down the growth of cancer cells. This medicine may be used for other purposes; ask your health care provider or pharmacist if you have questions. COMMON BRAND NAME(S): Adrucil What should I tell my care team before I take this medication? They need to know if you have any of these conditions: Blood disorders Dihydropyrimidine dehydrogenase (DPD) deficiency Infection, such as chickenpox, cold sores, herpes Kidney disease Liver disease Poor nutrition Recent or ongoing radiation therapy An unusual or allergic reaction to fluorouracil, other medications, foods, dyes, or preservatives If you or your partner are pregnant or trying to get pregnant Breast-feeding How should I use this medication? This medication is injected into a vein. It is administered by your care team in a hospital or clinic setting. Talk to your care team about the use of this medication in children. Special care may be needed. Overdosage: If you think you have taken too much of this medicine contact a poison control center or emergency room at once. NOTE: This medicine is only for you. Do not share this medicine with others. What if I miss a dose? Keep appointments for follow-up doses. It is  important not to miss your dose. Call your care team if you are unable to keep an appointment. What may interact with this medication? Do not take this medication with any of the following: Live virus vaccines This medication may also interact with the following: Medications that treat or prevent blood clots, such as warfarin, enoxaparin, dalteparin This list may not describe all possible interactions. Give your health care provider a list of all the medicines, herbs, non-prescription drugs, or dietary supplements you use. Also tell them if you smoke, drink alcohol, or use illegal drugs. Some items may interact with your medicine. What should I watch for while using this medication? Your condition will be monitored carefully while you are receiving this medication. This medication may make you feel generally unwell. This is not uncommon as chemotherapy can affect healthy cells as well as cancer cells. Report any side effects. Continue your course of treatment even though you feel ill unless your care team tells  you to stop. In some cases, you may be given additional medications to help with side effects. Follow all directions for their use. This medication may increase your risk of getting an infection. Call your care team for advice if you get a fever, chills, sore throat, or other symptoms of a cold or flu. Do not treat yourself. Try to avoid being around people who are sick. This medication may increase your risk to bruise or bleed. Call your care team if you notice any unusual bleeding. Be careful brushing or flossing your teeth or using a toothpick because you may get an infection or bleed more easily. If you have any dental work done, tell your dentist you are receiving this medication. Avoid taking medications that contain aspirin, acetaminophen, ibuprofen, naproxen, or ketoprofen unless instructed by your care team. These medications may hide a fever. Do not treat diarrhea with over the  counter products. Contact your care team if you have diarrhea that lasts more than 2 days or if it is severe and watery. This medication can make you more sensitive to the sun. Keep out of the sun. If you cannot avoid being in the sun, wear protective clothing and sunscreen. Do not use sun lamps, tanning beds, or tanning booths. Talk to your care team if you or your partner wish to become pregnant or think you might be pregnant. This medication can cause serious birth defects if taken during pregnancy and for 3 months after the last dose. A reliable form of contraception is recommended while taking this medication and for 3 months after the last dose. Talk to your care team about effective forms of contraception. Do not father a child while taking this medication and for 3 months after the last dose. Use a condom while having sex during this time period. Do not breastfeed while taking this medication. This medication may cause infertility. Talk to your care team if you are concerned about your fertility. What side effects may I notice from receiving this medication? Side effects that you should report to your care team as soon as possible: Allergic reactions--skin rash, itching, hives, swelling of the face, lips, tongue, or throat Heart attack--pain or tightness in the chest, shoulders, arms, or jaw, nausea, shortness of breath, cold or clammy skin, feeling faint or lightheaded Heart failure--shortness of breath, swelling of the ankles, feet, or hands, sudden weight gain, unusual weakness or fatigue Heart rhythm changes--fast or irregular heartbeat, dizziness, feeling faint or lightheaded, chest pain, trouble breathing High ammonia level--unusual weakness or fatigue, confusion, loss of appetite, nausea, vomiting, seizures Infection--fever, chills, cough, sore throat, wounds that don't heal, pain or trouble when passing urine, general feeling of discomfort or being unwell Low red blood cell  level--unusual weakness or fatigue, dizziness, headache, trouble breathing Pain, tingling, or numbness in the hands or feet, muscle weakness, change in vision, confusion or trouble speaking, loss of balance or coordination, trouble walking, seizures Redness, swelling, and blistering of the skin over hands and feet Severe or prolonged diarrhea Unusual bruising or bleeding Side effects that usually do not require medical attention (report to your care team if they continue or are bothersome): Dry skin Headache Increased tears Nausea Pain, redness, or swelling with sores inside the mouth or throat Sensitivity to light Vomiting This list may not describe all possible side effects. Call your doctor for medical advice about side effects. You may report side effects to FDA at 1-800-FDA-1088. Where should I keep my medication? This medication is given in  a hospital or clinic. It will not be stored at home. NOTE: This sheet is a summary. It may not cover all possible information. If you have questions about this medicine, talk to your doctor, pharmacist, or health care provider.  2024 Elsevier/Gold Standard (2021-11-20 00:00:00)

## 2023-08-19 NOTE — Assessment & Plan Note (Signed)
Follow up with nutritionist  Continue nutrition supplements.  continue Marinol 5mg  BID.

## 2023-08-19 NOTE — Assessment & Plan Note (Signed)
 Refer to physical therapy MRI thoracic lumbar spine w w contrast showed no cord compression.

## 2023-08-19 NOTE — Assessment & Plan Note (Signed)
Chemotherapy plan as listed above 

## 2023-08-19 NOTE — Assessment & Plan Note (Addendum)
Likely TIA. He had stroke work up during hospitalization. Possible PFO Refer to neurology.  Continue Aspirin 81mg  daily, and also on plavix 75mg  daily for 3 weeks.

## 2023-08-19 NOTE — Assessment & Plan Note (Signed)
Grade 2, he prefers to hold of neuropathy treatments.  observation.

## 2023-08-19 NOTE — Assessment & Plan Note (Signed)
Off Eliquis 2.5mg  BID due to GI bleeding On Aspirin 81mg  daily now.

## 2023-08-19 NOTE — Assessment & Plan Note (Addendum)
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 KRAS G12V, TMB 5.3, MSI Stable.  CPS 65%  1st line  FOLFOX , palliative RT --> 12/2022 CT progression--> switch to 5-FU + Nivolumab Q2 weeks  --> 02/2023 PET progression- pneumonitis--> 03/27/23  3rd line  Taxol and Cyramza --> 05/02/23 CT during admission showed slight decrease of liver lesion size. --> 10/30 PET obtained by radonc -liver lesion 10cm, bone mets. --> 4th line FOLFIRI Refer to [facility] for expert opinion. He declined.  GI bleeding due to esophageal cancer, s/p  palliative RT Labs are reviewed and discussed with patient. Proceed with cycle 3  FOLFIRI -irinotecan dose reduction to 100mg /m2  Plan to repeat CT after 4 cycles of chemo

## 2023-08-19 NOTE — Assessment & Plan Note (Signed)
Due to radiation/immunotherapy.  SOB cough symptoms are improved.   taper down to 20mg  daily this week

## 2023-08-19 NOTE — Progress Notes (Signed)
Hematology/Oncology Progress note Telephone:(336) [phone] Fax:(336) [phone]       CHIEF COMPLAINTS/PURPOSE OF CONSULTATION:  Stage IV Esophageal adenocarcinoma  ASSESSMENT & PLAN:   Cancer Staging  Adenocarcinoma of esophagus [facility]) Staging form: Esophagus - Adenocarcinoma, AJCC 8th Edition - Clinical stage from 08/28/2022: Stage IVB (cT3, cN1, cM1) - Signed by Rickard Patience, MD on 10/04/2022   Adenocarcinoma of esophagus (HCC) Stage IV esophageal adenocarcinoma, liver metastatic disease, HER 2 negative. KRAS G12V, TMB 5.3, MSI Stable.  CPS 65%  1st line  FOLFOX , palliative RT --> 12/2022 CT progression--> switch to 5-FU + Nivolumab Q2 weeks  --> 02/2023 PET progression- pneumonitis--> 03/27/23  3rd line  Taxol and Cyramza --> 05/02/23 CT during admission showed slight decrease of liver lesion size. --> 10/30 PET obtained by radonc -liver lesion 10cm, bone mets. --> 4th line FOLFIRI Refer to [facility] for expert opinion. He declined.  GI bleeding due to esophageal cancer, s/p  palliative RT Labs are reviewed and discussed with patient. Proceed with cycle 3  FOLFIRI -irinotecan dose reduction to 100mg /m2  Plan to repeat CT after 4 cycles of chemo    Chemotherapy-induced neuropathy (HCC) Grade 2, he prefers to hold of neuropathy treatments.  observation.   Deep venous thrombosis (HCC) Off Eliquis 2.5mg  BID due to GI bleeding On Aspirin 81mg  daily now.   Encounter for antineoplastic chemotherapy Chemotherapy plan as listed above.   Hypokalemia Chronic hypokalemia, normal magnesia level.  Cotinue KCL to BID.  K is 3 today, will give him IV KCL over 1 hour.       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  SOB cough symptoms are improved.   taper down to 20mg  daily this week    Transient neurological symptoms Likely TIA. He had stroke work up during hospitalization.  Refer to neurology.  Continue Aspirin 81mg  daily, and also on plavix 75mg  daily for 3 weeks.    Weight loss Follow up with nutritionist  Continue nutrition supplements.  continue Marinol 5mg  BID.   Weakness of both legs Refer to physical therapy MRI thoracic lumbar spine w w contrast showed no cord compression.     Orders Placed This Encounter  Procedures   Magnesium    Standing Status:   Future    Number of Occurrences:   1    Expected Date:   08/19/2023    Expiration Date:   08/18/2024   Amb Referral to Neuro Oncology    Referral Priority:   Routine    Referral Type:   Consultation    Referral Reason:   Specialty Services Required    Number of Visits Requested:   1    Follow-up  2 weeks.   All questions were answered. The patient knows to call the clinic with any problems, questions or concerns.  Rickard Patience, MD, PhD [facility] Hematology Oncology 08/19/2023   HISTORY OF PRESENTING ILLNESS:  Jonathan [name] 68 y.o. male presents to establish care for esophageal adenocarcinoma I have reviewed his chart and materials related to his cancer extensively and collaborated history with the patient. Summary of oncologic history is as follows: Oncology History  Adenocarcinoma of esophagus (HCC)  08/28/2022 Initial Diagnosis   Adenocarcinoma of esophagus   -Patient has noticed worsening of "food stuck/fullness" sensation since November 2023.  Patient had a barium swallow study which commented on marked mucosal irregularity in the distal esophagus with Broaddus base mural filling defect highly suspicious for malignancy.  Patient establish care with gastroenterology. -08/23/2022, EGD showed  gastritis and partially obstructing malignant esophageal tumor in the lower third of the esophagus. Esophagus mass biopsy showed adenocarcinoma.  PD-L1 TPS 65%, HER2 negative.  [facility] NGS showed KRASG12V, CDKN2A, ARID1A, TP53, TMB 5.3, MSI stable.  Stomach biopsy showed gastric mucosa with no specific histology abnormality.  No significant intestinal metaplastic, dysplastic, granular atrophy  or increased inflammation.     08/28/2022 Imaging   CT chest abdomen pelvis with contrast showed 1. Distal esophageal primary with gastrohepatic ligament nodal metastasis. 2. 2 right-sided pulmonary nodules, the largest of which measures 5 mm and is new since 2015. Pulmonary metastasis not be excluded. 3. Anterior right lower lobe volume loss and minimal soft tissue density, favoring atelectasis or scar. Recommend attention on follow-up. 4. Hepatic steatosis 5. Cholelithiasis 6. Left nephrolithiasis 7. Coronary artery atherosclerosis. Aortic Atherosclerosis   08/28/2022 Cancer Staging   Staging form: Esophagus - Adenocarcinoma, AJCC 8th Edition - Clinical stage from 08/28/2022: Stage IVB (cT3, cN1, cM1) - Signed by Rickard Patience, MD on 10/04/2022 Stage prefix: Initial diagnosis   09/06/2022 Imaging   PET scan showed 1. Esophageal primary with gastrohepatic ligament nodal metastasis,as on CT. 2. Focus of hypermetabolism which is favored to registered to the posterior hepatic dome, in the region of subtle heterogeneity on prior diagnostic CT. Suboptimally evaluated secondary to underlying steatosis. Recommend further evaluation with pre and post contrast abdominal MRI to confirm probable metastasis. 3. Incidental findings, including: Left nephrolithiasis.Cholelithiasis. Coronary artery atherosclerosis. Aortic Atherosclerosis    09/23/2022 Imaging   MRI abdomen with and without contrast showed 1. Mildly T2 hyperintense segment VII hepatic lesion measuring 2.7 cm with imaging characteristics compatible with metastatic disease. 2. Tiny focus of delayed enhancement in the inferior right lobe of the liver segment VI measuring 8 mm with ill-defined increased T2 signal and subtle corresponding reduced diffusivity, also suspicious for metastatic disease. 3. Partially visualized distal esophageal wall thickening compatible with the patient's known primary neoplasm. 4. Similar size of the 11 mm  gastrohepatic ligament lymph node mildly metabolic on prior PET-CT and compatible with local nodal disease involvement. 5. 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 Recommend follow up pre and post-contrast MRI/MRCP in 1 year. 6. Diffuse hepatic steatosis.   10/10/2022 Procedure   LIVER MASS; CT-GUIDED BIOPSY:  - MODERATE TO POORLY DIFFERENTIATED ADENOCARCINOMA MORPHOLOGICALLY  CONSISTENT WITH METASTASIS FROM PATIENT'S KNOWN ESOPHAGEAL  ADENOCARCINOMA.    10/17/2022 Procedure   Medi port placed by Dr. Wyn Quaker   10/21/2022 - 01/15/2023 Chemotherapy   Patient is on Treatment Plan : ESOPHAGEAL ADENOCARCINOMA FOLFOX q14d x 6 cycles     01/15/2023 Imaging   PET showed  1. Interval progression in metastatic esophageal carcinoma as evidenced by hypermetabolic lymph nodes in the neck, chest, abdomen and pelvis, an enlarging right hepatic lobe metastasis, new bilateral adrenal metastases and new osseous metastases. 2. Cholelithiasis. 3. Left renal stones. 4. Aortic atherosclerosis (ICD10-I70.0). Coronary artery calcification.    01/27/2023 - 03/14/2023 Chemotherapy   Patient is on Treatment Plan : GASTROESOPHAGEAL FOLFOX + Nivolumab q14d     03/20/2023 Imaging   CT chest abdomen pelvis w contrast showed 1. Interval progression of metastatic disease with multiple new and enlarged hypodense lesions throughout the liver. 2. Significant enlargement of bilateral adrenal metastases. 3. Subtle sclerosis of an osseous metastasis of the right femoral neck. Other previously FDG avid osseous metastases of the left femoral neck and right sixth rib not appreciated by CT.  4. No persistently enlarged lymph nodes. 5. Interval development of bandlike consolidation and fibrosis of the paramedian right lung, particularly of the right lower lobe, as well as additional scattered irregular opacities throughout the right lung. Findings are  most consistent with development of radiation pneumonitis and fibrosis. 6. Similar circumferential wall thickening throughout the mid to lower esophagus, consistent with known primary esophageal adenocarcinoma. 7. Nonobstructive bilateral nephrolithiasis. Aortic Atherosclerosis   03/27/2023 - 06/19/2023 Chemotherapy   Patient is on Treatment Plan : GASTROESOPHAGEAL Ramucirumab D1, 15 + Paclitaxel D1,8,15 q28d     05/02/2023 - 05/08/2023 Hospital Admission   Admission due to coffee-ground emesis. EGD 10/7 showed bleeding from esophageal cancer, esophagitis due to chemo and radiation. Applied Hemospray. Eliquis was stopped. Acute blood loss anemia, received IV venofer treatments x 3 doses    05/28/2023 Imaging   1. Progressive hypermetabolism in the distal esophagus consistent with progressive disease. 2. Significant progression of metastatic hepatic disease (new and enlarging lesions). 3. Enlarging and progressively hypermetabolic right adrenal gland mass. 4. New hypermetabolic bone lesions in the T12 and L2 vertebral bodies, left sacrum and right sixth posterior rib consistent with metastatic disease. 5. Possible hypermetabolic brain lesions. MRI suggested for further evaluation. 6. Chronic medial right lower lobe atelectasis likely radiation change. Moderate hypermetabolism but no obvious tumor. 7. Stable gallstones, renal calculi and vascular calcifications.   06/19/2023 Imaging   CT chest angiogram w contrast, CT abdomen pelvis w contrast  1. Negative for pulmonary embolus. 2. Persistent lower esophageal wall thickening with progressive hepatic and right adrenal metastatic disease, compatible with stage IV esophageal carcinoma, as on PET 05/21/2023. 3. 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  4. Minimal patchy mid and lower lung zone predominant coarsened interstitial and subpleural ground-glass with bronchiectasis, indicative of interstitial lung disease such as  fibrotic nonspecific interstitial pneumonitis. 5. 4.0 cm ascending aortic aneurysm. Recommend annual imaging follow up by CTA or MRA. This recommendation follows 2010 ACCF/AHA/AATS/ACR/ASA/SCA/SCAI/SIR/STS/SVM Guidelines for the Diagnosis and Management of Patients with Thoracic Aortic Disease.Circulation. 2010; 121: Q657-Q469. Aortic aneurysm NOS (ICD10-I71.9). 6. Cholelithiasis. 7. Small left renal stone. 8. Aortic atherosclerosis (ICD10-I70.0). Left and descending coronary artery calcification.   06/30/2023 -  Chemotherapy   Patient is on Treatment Plan : COLORECTAL FOLFIRI q14d     Patient presents to establish care.  He is not taking PPI. He has intentionally lost some weight. Family history positive for father and paternal uncle with prostate cancer and sister with breast cancer. Denies any routine alcohol use  Right interval jugular vein occlusive DVT, started on Eliquis starter package on 10/28/2022, swelling of neck has improved.   CXR showed Interval development of bilateral perihilar interstitial opacities  He was treated with Azithromycin and Doxycycline to cover atypical infections.   INTERVAL HISTORY LAJUAN PASCHEN is a 68 y.o. male who has above history reviewed by me today presents for follow up visit for Stage IV esophageal adenocarcinoma.   Recent TIA event, hospitalized and he completed stroke work up Brain MRI negative for any acute intracranial findings.  CTA head and neck negative for large vessel occlusion, atherosclerotic changes noticed.  TTE shows LVEF 55 to 60%, grade 1 diastolic dysfunction, positive PFO   Today he report feeling well. No focal weakness.  No nausea vomiting diarrhea.  Chronic nasal congestion, postnatal drip, mucus production.  No SOB    MEDICAL HISTORY:  Past Medical History:  Diagnosis Date   Arthritis    Complication of anesthesia  OCCURRED ONCE YEARS AGO 1981   Deep venous thrombosis (HCC) 10/28/2022   Esophageal mass     Essential hypertension 05/01/2023   Hypertension    Hypothyroidism    PONV (postoperative nausea and vomiting)     SURGICAL HISTORY: Past Surgical History:  Procedure Laterality Date   COLONOSCOPY     ESOPHAGOGASTRODUODENOSCOPY N/A 08/23/2022   Procedure: ESOPHAGOGASTRODUODENOSCOPY (EGD);  Surgeon: Jaynie Collins, DO;  Location: [facility] ENDOSCOPY;  Service: Gastroenterology;  Laterality: N/A;   ESOPHAGOGASTRODUODENOSCOPY (EGD) WITH PROPOFOL N/A 05/05/2023   Procedure: ESOPHAGOGASTRODUODENOSCOPY (EGD) WITH PROPOFOL;  Surgeon: Jaynie Collins, DO;  Location: [facility] ENDOSCOPY;  Service: Gastroenterology;  Laterality: N/A;   EUS N/A 09/12/2022   Procedure: FULL UPPER ENDOSCOPIC ULTRASOUND (EUS) RADIAL;  Surgeon: Bearl Mulberry, MD;  Location: [facility] ENDOSCOPY;  Service: Gastroenterology;  Laterality: N/A;   FINGER SURGERY     HEMOSTASIS CONTROL  05/05/2023   Procedure: HEMOSTASIS CONTROL;  Surgeon: Jaynie Collins, DO;  Location: [facility] ENDOSCOPY;  Service: Gastroenterology;;   PORTA CATH INSERTION N/A 10/17/2022   Procedure: PORTA CATH INSERTION;  Surgeon: Annice Needy, MD;  Location: [facility] INVASIVE CV LAB;  Service: Cardiovascular;  Laterality: N/A;   TENDON REPAIR IN LEFT KNEE      SOCIAL HISTORY: Social History   Socioeconomic History   Marital status: Single    Spouse name: Not on file   Number of children: Not on file   Years of education: Not on file   Highest education level: Not on file  Occupational History   Not on file  Tobacco Use   Smoking status: Never   Smokeless tobacco: Never  Vaping Use   Vaping status: Never Used  Substance and Sexual Activity   Alcohol use: Yes    Comment: OCCASIONALLY   Drug use: Never   Sexual activity: Not on file  Other Topics Concern   Not on file  Social History Narrative   Not on file   Social Drivers of Health   Financial Resource Strain: Low Risk  (10/29/2022)   [facility], [facility]   Overall Financial Resource Strain (CARDIA)    Difficulty of Paying Living Expenses: Not hard at all  Food Insecurity: No Food Insecurity (05/30/2023)   Hunger Vital Sign    Worried About Running Out of Food in the Last Year: Never true    Ran Out of Food in the Last Year: Never true  Transportation Needs: No Transportation Needs (05/30/2023)   PRAPARE - Administrator, Civil Service (Medical): No    Lack of Transportation (Non-Medical): No  Physical Activity: Not on file  Stress: Not on file  Social Connections: Not on file  Intimate Partner Violence: Not At Risk (05/30/2023)   Humiliation, Afraid, Rape, and Kick questionnaire    Fear of Current or Ex-Partner: No    Emotionally Abused: No    Physically Abused: No    Sexually Abused: No    FAMILY HISTORY: Family History  Problem Relation Age of Onset   Heart attack Mother    Prostate cancer Father    Breast cancer Sister    Prostate cancer Paternal Uncle     ALLERGIES:  has no known allergies.  MEDICATIONS:  Current Outpatient Medications  Medication Sig Dispense Refill   acetaminophen (TYLENOL) 650 MG CR tablet Take 1,300 mg by mouth every 8 (eight) hours as needed for pain.     aspirin EC 81 MG tablet Take  1 tablet (81 mg total) by mouth daily. Swallow whole. 30 tablet 12   azelastine (ASTELIN) 0.1 % nasal spray Place 1 spray into both nostrils 2 (two) times daily. Use in each nostril as directed 30 mL 1   chlorhexidine (PERIDEX) 0.12 % solution USE AS DIRECTED TAKE  15  ML  IN  THE  MOUTH  OR  THROAT  TWICE  DAILY 473 mL 0   clopidogrel (PLAVIX) 75 MG tablet Take 1 tablet (75 mg total) by mouth daily for 20 days. 20 tablet 0   dronabinol (MARINOL) 5 MG capsule Take 1 capsule (5 mg total) by mouth 2 (two) times daily before lunch and supper. 60 capsule 0   gentamicin ointment (GARAMYCIN) 0.1 % Apply 1 Application topically 2 (two) times daily as needed.     ipratropium (ATROVENT) 0.03 %  nasal spray Place 2 sprays into both nostrils 2 (two) times daily.     ketoconazole (NIZORAL) 2 % shampoo Apply 1 Application topically 2 (two) times a week.     levothyroxine (SYNTHROID) 125 MCG tablet Take 125 mcg by mouth every morning.     loperamide (IMODIUM) 2 MG capsule Take 2 tabs by mouth with first loose stool, then 1 tab with each additional loose stool as needed. Do not exceed 8 tabs in a 24-hour period 90 capsule 0   magic mouthwash (multi-ingredient) oral suspension Swish and spit 5-10 mLs by mouth 4 (four) times daily as needed. 480 mL 1   metoprolol tartrate (LOPRESSOR) 25 MG tablet Take 1 tablet (25 mg total) by mouth 2 (two) times daily. 180 tablet 3   OLANZapine (ZYPREXA) 10 MG tablet Take 0.5-1 tablets (5-10 mg total) by mouth at bedtime as needed (nausea). 30 tablet 3   potassium chloride SA (KLOR-CON M) 20 MEQ tablet Take 2 tablets (40 mEq total) by mouth 2 (two) times daily. 60 tablet 1   prochlorperazine (COMPAZINE) 10 MG tablet Take 10 mg by mouth every 6 (six) hours as needed.     pantoprazole (PROTONIX) 40 MG tablet Take 1 tablet (40 mg total) by mouth 2 (two) times daily. 60 tablet 3   predniSONE (DELTASONE) 10 MG tablet Take 2 tablets (20 mg total) by mouth daily with breakfast. 60 tablet 0   No current facility-administered medications for this visit.   Facility-Administered Medications Ordered in Other Visits  Medication Dose Route Frequency Provider Last Rate Last Admin   0.9 %  sodium chloride infusion   Intravenous Continuous Rickard Patience, MD 10 mL/hr at 08/19/23 1003 New Bag at 08/19/23 1003   fluorouracil (ADRUCIL) 5,000 mg in sodium chloride 0.9 % 150 mL chemo infusion  2,400 mg/m2 (Order-Specific) Intravenous 1 day or 1 dose Rickard Patience, MD       irinotecan (CAMPTOSAR) 200 mg in sodium chloride 0.9 % 500 mL chemo infusion  100 mg/m2 (Order-Specific) Intravenous Once Rickard Patience, MD 340 mL/hr at 08/19/23 1111 200 mg at 08/19/23 1111   leucovorin 796 mg in sodium chloride  0.9 % 250 mL infusion  400 mg/m2 (Order-Specific) Intravenous Once Rickard Patience, MD 193 mL/hr at 08/19/23 1109 796 mg at 08/19/23 1109    Review of Systems  Constitutional:  Positive for appetite change and fatigue. Negative for chills and fever.  HENT:   Negative for hearing loss and voice change.        Nasal congestion, postnasal drip.   Eyes:  Negative for eye problems and icterus.  Respiratory:  Negative for chest tightness, cough  and shortness of breath.   Cardiovascular:  Negative for chest pain and leg swelling.  Gastrointestinal:  Positive for nausea. Negative for abdominal distention, abdominal pain and vomiting.  Endocrine: Negative for hot flashes.  Genitourinary:  Negative for difficulty urinating, dysuria and frequency.   Musculoskeletal:  Negative for arthralgias.  Skin:  Negative for itching and rash.  Neurological:  Positive for extremity weakness and numbness. Negative for light-headedness.  Hematological:  Negative for adenopathy. Does not bruise/bleed easily.  Psychiatric/Behavioral:  Negative for confusion.   See interval history.    PHYSICAL EXAMINATION: ECOG PERFORMANCE STATUS: 1 - Symptomatic but completely ambulatory Vitals:   08/19/23 0907  BP: (!) 122/92  Pulse: 98  Resp: 18  Temp: (!) 96 F (35.6 C)  SpO2: 100%   Filed Weights   08/19/23 0907  Weight: 170 lb 3.2 oz (77.2 kg)     Physical Exam Constitutional:      General: He is not in acute distress.    Appearance: He is obese. He is not diaphoretic.  HENT:     Head: Normocephalic and atraumatic.  Eyes:     General: No scleral icterus. Cardiovascular:     Rate and Rhythm: Normal rate and regular rhythm.  Pulmonary:     Effort: Pulmonary effort is normal. No respiratory distress.     Breath sounds: No wheezing.  Abdominal:     General: There is no distension.     Palpations: Abdomen is soft.  Musculoskeletal:        General: Normal range of motion.     Cervical back: Normal range of  motion and neck supple.  Skin:    General: Skin is warm and dry.     Findings: No erythema.  Neurological:     General: No focal deficit present.     Mental Status: He is alert and oriented to person, place, and time. Mental status is at baseline.     Motor: No abnormal muscle tone.     Comments: No focal deficit   Psychiatric:        Mood and Affect: Mood and affect normal.      LABORATORY DATA:  I have reviewed the data as listed    Latest Ref Rng & Units 08/19/2023    8:52 AM 08/11/2023    8:35 AM 08/11/2023    8:06 AM  CBC  WBC 4.0 - 10.5 K/uL 9.5  6.5  6.9   Hemoglobin 13.0 - 17.0 g/dL 07.3  71.0  62.6   Hematocrit 39.0 - 52.0 % 40.3  39.3  39.1   Platelets 150 - 400 K/uL 303  234  229       Latest Ref Rng & Units 08/19/2023    8:52 AM 08/12/2023    6:03 AM 08/11/2023    8:35 AM  CMP  Glucose 70 - 99 mg/dL 96  81  84   BUN 8 - 23 mg/dL 14  11  13    Creatinine 0.61 - 1.24 mg/dL 9.48  5.46  2.70   Sodium 135 - 145 mmol/L 135  141  137   Potassium 3.5 - 5.1 mmol/L 3.0  3.8  2.9   Chloride 98 - 111 mmol/L 100  106  102   CO2 22 - 32 mmol/L 23  26  22    Calcium 8.9 - 10.3 mg/dL 9.2  9.0  9.0   Total Protein 6.5 - 8.1 g/dL 7.2   6.8   Total Bilirubin 0.0 - 1.2 mg/dL  0.5   0.9   Alkaline Phos 38 - 126 U/L 105   89   AST 15 - 41 U/L 30   17   ALT 0 - 44 U/L 22   22      RADIOGRAPHIC STUDIES: I have personally reviewed the radiological images as listed and agreed with the findings in the report. US Venous Img Lower Bilateral (DVT) Result Date: 08/12/2023 CLINICAL DATA:  Patent foramen ovale with transient ischemic attack. Prior history of DVT. Assess for residual DVT. EXAM: BILATERAL LOWER EXTREMITY VENOUS DOPPLER ULTRASOUND TECHNIQUE: Gray-scale sonography with graded compression, as well as color Doppler and duplex ultrasound were performed to evaluate the lower extremity deep venous systems from the level of the common femoral vein and including the common femoral,  femoral, profunda femoral, popliteal and calf veins including the posterior tibial, peroneal and gastrocnemius veins when visible. The superficial great saphenous vein was also interrogated. Spectral Doppler was utilized to evaluate flow at rest and with distal augmentation maneuvers in the common femoral, femoral and popliteal veins. COMPARISON:  None Available. FINDINGS: RIGHT LOWER EXTREMITY Common Femoral Vein: No evidence of thrombus. Normal compressibility, respiratory phasicity and response to augmentation. Saphenofemoral Junction: No evidence of thrombus. Normal compressibility and flow on color Doppler imaging. Profunda Femoral Vein: No evidence of thrombus. Normal compressibility and flow on color Doppler imaging. Femoral Vein: No evidence of thrombus. Normal compressibility, respiratory phasicity and response to augmentation. Popliteal Vein: No evidence of thrombus. Normal compressibility, respiratory phasicity and response to augmentation. Calf Veins: No evidence of thrombus. Normal compressibility and flow on color Doppler imaging. Superficial Great Saphenous Vein: No evidence of thrombus. Normal compressibility. Venous Reflux:  None. Other Findings:  None. LEFT LOWER EXTREMITY Common Femoral Vein: No evidence of thrombus. Normal compressibility, respiratory phasicity and response to augmentation. Saphenofemoral Junction: No evidence of thrombus. Normal compressibility and flow on color Doppler imaging. Profunda Femoral Vein: No evidence of thrombus. Normal compressibility and flow on color Doppler imaging. Femoral Vein: No evidence of thrombus. Normal compressibility, respiratory phasicity and response to augmentation. Popliteal Vein: No evidence of thrombus. Normal compressibility, respiratory phasicity and response to augmentation. Calf Veins: No evidence of thrombus. Normal compressibility and flow on color Doppler imaging. Superficial Great Saphenous Vein: No evidence of thrombus. Normal  compressibility. Venous Reflux:  None. Other Findings:  None. IMPRESSION: No evidence of deep venous thrombosis in either lower extremity. Electronically Signed   By: Malachy Moan M.D.   On: 08/12/2023 16:07   ECHOCARDIOGRAM COMPLETE Result Date: 08/12/2023    ECHOCARDIOGRAM REPORT   Patient Name:   Jonathan [name] [facility] Date of Exam: 08/12/2023 Medical Rec #:  [id number]         Height:       69.0 in Accession #:    [id number]        Weight:       175.0 lb Date of Birth:  28-Jan-1956         BSA:          1.952 m Patient Age:    67 years          BP:           133/91 mmHg Patient Gender: M                 HR:           76 bpm. Exam Location:  [facility] Procedure: 2D Echo, Cardiac Doppler, Color Doppler and Saline Contrast Bubble  Study Indications:     TIA G45.9  History:         Patient has no prior history of Echocardiogram examinations.                  Risk Factors:Hypertension. DVT.  Sonographer:     Cristela Blue Referring Phys:  [id number] Emeline General Diagnosing Phys: Yvonne Kendall MD IMPRESSIONS  1. Left ventricular ejection fraction, by estimation, is 55 to 60%. The left ventricle has normal function. The left ventricle has no regional wall motion abnormalities. There is mild left ventricular hypertrophy. Left ventricular diastolic parameters are consistent with Grade I diastolic dysfunction (impaired relaxation).  2. Right ventricular systolic function is normal. The right ventricular size is normal. Tricuspid regurgitation signal is inadequate for assessing PA pressure.  3. The mitral valve is normal in structure. No evidence of mitral valve regurgitation. No evidence of mitral stenosis.  4. The aortic valve is tricuspid. Aortic valve regurgitation is not visualized. No aortic stenosis is present.  5. Agitated saline contrast bubble study was positive with shunting observed within 3-6 cardiac cycles suggestive of interatrial shunt. FINDINGS  Left Ventricle: Left ventricular ejection fraction, by  estimation, is 55 to 60%. The left ventricle has normal function. The left ventricle has no regional wall motion abnormalities. The left ventricular internal cavity size was normal in size. There is  mild left ventricular hypertrophy. Left ventricular diastolic parameters are consistent with Grade I diastolic dysfunction (impaired relaxation). Right Ventricle: The right ventricular size is normal. No increase in right ventricular wall thickness. Right ventricular systolic function is normal. Tricuspid regurgitation signal is inadequate for assessing PA pressure. Left [facility]: Left atrial size was normal in size. Right [facility]: Right atrial size was normal in size. Pericardium: The pericardium was not well visualized. Mitral Valve: The mitral valve is normal in structure. No evidence of mitral valve regurgitation. No evidence of mitral valve stenosis. MV peak gradient, 3.9 mmHg. The mean mitral valve gradient is 1.0 mmHg. Tricuspid Valve: The tricuspid valve is normal in structure. Tricuspid valve regurgitation is trivial. Aortic Valve: The aortic valve is tricuspid. Aortic valve regurgitation is not visualized. No aortic stenosis is present. Aortic valve mean gradient measures 2.0 mmHg. Aortic valve peak gradient measures 2.9 mmHg. Aortic valve area, by VTI measures 4.62 cm. Pulmonic Valve: The pulmonic valve was not well visualized. Pulmonic valve regurgitation is trivial. No evidence of pulmonic stenosis. Aorta: The aortic root is normal in size and structure. Pulmonary Artery: The pulmonary artery is not well seen. Venous: The inferior vena cava was not well visualized. IAS/Shunts: The interatrial septum was not well visualized. Agitated saline contrast was given intravenously to evaluate for intracardiac shunting. Agitated saline contrast bubble study was positive with shunting observed within 3-6 cardiac cycles suggestive of interatrial shunt.  LEFT VENTRICLE PLAX 2D LVIDd:         3.70 cm   Diastology LVIDs:          2.60 cm   LV e' medial:    6.20 cm/s LV PW:         1.30 cm   LV E/e' medial:  11.0 LV IVS:        1.20 cm   LV e' lateral:   11.70 cm/s LVOT diam:     2.30 cm   LV E/e' lateral: 5.8 LV SV:         71 LV SV Index:   36 LVOT Area:     4.15 cm  RIGHT VENTRICLE RV Basal diam:  2.60 cm RV Mid diam:    2.60 cm LEFT ATRIUM             Index       RIGHT ATRIUM          Index LA diam:        1.80 cm 0.92 cm/m  RA Area:     8.90 cm LA Vol (A2C):   12.6 ml 6.45 ml/m  RA Volume:   14.30 ml 7.33 ml/m LA Vol (A4C):   10.2 ml 5.23 ml/m LA Biplane Vol: 12.2 ml 6.25 ml/m  AORTIC VALVE AV Area (Vmax):    3.56 cm AV Area (Vmean):   3.55 cm AV Area (VTI):     4.62 cm AV Vmax:           85.65 cm/s AV Vmean:          59.750 cm/s AV VTI:            0.153 m AV Peak Grad:      2.9 mmHg AV Mean Grad:      2.0 mmHg LVOT Vmax:         73.40 cm/s LVOT Vmean:        51.100 cm/s LVOT VTI:          0.170 m LVOT/AV VTI ratio: 1.11  AORTA Ao Root diam: 3.40 cm MITRAL VALVE MV Area (PHT): 3.23 cm    SHUNTS MV Area VTI:   3.51 cm    Systemic VTI:  0.17 m MV Peak grad:  3.9 mmHg    Systemic Diam: 2.30 cm MV Mean grad:  1.0 mmHg MV Vmax:       0.98 m/s MV Vmean:      52.6 cm/s MV Decel Time: 235 msec MV E velocity: 68.10 cm/s MV A velocity: 95.40 cm/s MV E/A ratio:  0.71 Cristal Deer End MD Electronically signed by Yvonne Kendall MD Signature Date/Time: 08/12/2023/10:12:56 AM    Final    MR BRAIN WO CONTRAST Result Date: 08/11/2023 CLINICAL DATA:  TIA. Episode of slurred speech, left facial droop and left upper extremity weakness lasting approximately 1 minute. EXAM: MRI HEAD WITHOUT CONTRAST TECHNIQUE: Multiplanar, multiecho pulse sequences of the brain and surrounding structures were obtained without intravenous contrast. COMPARISON:  CT head without contrast 08/11/2023. MR head without and with contrast 06/04/2023. FINDINGS: Brain: No acute infarct, hemorrhage, or mass lesion is present. Scattered subcortical T2 hyperintensities  are stable and likely within normal limits for age. The ventricles are of normal size. No significant extraaxial fluid collection is present. Deep brain nuclei are within normal limits. The brainstem and cerebellum are within normal limits. Midline structures are within normal limits. The internal auditory canals are within normal limits. Vascular: Flow is present in the major intracranial arteries. The globes and orbits are within normal limits. Skull and upper cervical spine: Degenerative changes are present at the C3-4 disc level. At least partial effacement of ventral CSF is present. The craniocervical junction is normal. Marrow signal is normal. Sinuses/Orbits: The paranasal sinuses and mastoid air cells are clear. The globes and orbits are within normal limits. IMPRESSION: 1. Normal MRI appearance of the brain for age. No acute or focal lesion to explain the patient's symptoms. 2. Degenerative changes at the C3-4 disc level with at least partial effacement of ventral CSF. Electronically Signed   By: Marin Roberts M.D.   On: 08/11/2023 18:39   CT ANGIO HEAD NECK W WO CM  Result Date: 08/11/2023 CLINICAL DATA:  Episode of slurred speech, left-sided facial droop and left upper extremity weakness lasting 1 minute at 8 o'clock a.m. today at the cancer center. Esophageal cancer. EXAM: CT ANGIOGRAPHY HEAD AND NECK WITH AND WITHOUT CONTRAST TECHNIQUE: Multidetector CT imaging of the head and neck was performed using the standard protocol during bolus administration of intravenous contrast. Multiplanar CT image reconstructions and MIPs were obtained to evaluate the vascular anatomy. Carotid stenosis measurements (when applicable) are obtained utilizing NASCET criteria, using the distal internal carotid diameter as the denominator. RADIATION DOSE REDUCTION: This exam was performed according to the departmental dose-optimization program which includes automated exposure control, adjustment of the mA and/or kV  according to patient size and/or use of iterative reconstruction technique. CONTRAST:  75mL OMNIPAQUE IOHEXOL 350 MG/ML SOLN COMPARISON:  CT head without contrast 08/11/2023 FINDINGS: CTA NECK FINDINGS Aortic arch: Atherosclerotic calcifications are present at the distal aortic arch. No stenosis or aneurysm is present. No significant changes are present at the great vessel origins. A 3 vessel arch configuration is present. No dissection is present. Right carotid system: The right common carotid artery is mildly tortuous without focal stenosis. Bifurcation is within normal limits. Moderate tortuosity is present cervical right ICA without significant stenosis. Left carotid system: The left common carotid artery is within normal limits. Atherosclerotic calcifications are present at bifurcation without significant stenosis relative to the more distal vessel. The cervical left ICA is normal. Vertebral arteries: The right vertebral artery is the dominant vessel. Both vertebral arteries originate from the subclavian arteries without significant stenosis. No significant stenosis is present in either vertebral artery in the neck. Skeleton: Multilevel degenerative changes are present cervical spine. No focal osseous lesions are present. Uncovertebral spurring contributes to foraminal narrowing bilaterally at C4-5, C5-6 and C6-7. Other neck: Soft tissues the neck are otherwise unremarkable. Salivary glands are within normal limits. Thyroid is normal. No significant adenopathy is present. No focal mucosal or submucosal lesions are present. A right IJ Port-A-Cath is in place. Upper chest: The lung apices are clear. The thoracic inlet is within normal limits. No significant pleural effusion or pneumothorax is present. Review of the MIP images confirms the above findings CTA HEAD FINDINGS Anterior circulation: Atherosclerotic calcifications are present within the cavernous internal carotid arteries. No significant stenosis is  present. The ICA termini are normal bilaterally. The A1 and M1 segments are normal. The MCA bifurcations are normal bilaterally. ACA and MCA branch vessels are within normal limits. Posterior circulation: PICA origins are visualized and normal. Vertebrobasilar junction basilar artery normal. The superior cerebellar arteries are patent. Both posterior cerebral arteries originate from basilar tip. A left posterior communicating artery contributes. The is PCA branch vessels are within normal limits. Venous sinuses: The dural sinuses are patent. The straight sinus and deep cerebral veins are intact. Cortical veins are within normal limits. No significant vascular malformation is evident. Anatomic variants: None Review of the MIP images confirms the above findings IMPRESSION: 1. No emergent large vessel occlusion. 2. Atherosclerotic changes at the left carotid bifurcation and cavernous internal carotid arteries without significant stenosis relative to the more distal vessels. 3. Multilevel degenerative changes of the cervical spine. 4.  Aortic Atherosclerosis (ICD10-I70.0). Electronically Signed   By: Marin Roberts M.D.   On: 08/11/2023 18:37   CT HEAD WO CONTRAST Result Date: 08/11/2023 CLINICAL DATA:  Provided history: Neuro deficit, acute, stroke suspected. Slurred speech, left-sided facial droop, left arm weakness. Patient reports deficits have now resolved. EXAM: CT HEAD  WITHOUT CONTRAST TECHNIQUE: Contiguous axial images were obtained from the base of the skull through the vertex without intravenous contrast. RADIATION DOSE REDUCTION: This exam was performed according to the departmental dose-optimization program which includes automated exposure control, adjustment of the mA and/or kV according to patient size and/or use of iterative reconstruction technique. COMPARISON:  Brain MRI 06/04/2023. FINDINGS: Brain: Mild generalized parenchymal atrophy. There is no acute intracranial hemorrhage. No demarcated  cortical infarct. No extra-axial fluid collection. No evidence of an intracranial mass. No midline shift. Vascular: No hyperdense vessel.  Atherosclerotic calcifications. Skull: No calvarial fracture or aggressive osseous lesion. Sinuses/Orbits: No mass or acute finding within the imaged orbits. No significant paranasal sinus disease at the imaged levels. Other: Trace fluid within the ethmoid air cells. IMPRESSION: 1. No evidence of an acute intracranial abnormality. 2. Mild generalized parenchymal atrophy. Electronically Signed   By: Jackey Loge D.O.   On: 08/11/2023 09:04   MR Lumbar Spine W Wo Contrast Result Date: 07/29/2023 CLINICAL DATA:  Esophageal cancer being treated with chemotherapy. New weakness in both legs. EXAM: MRI THORACIC AND LUMBAR SPINE WITHOUT AND WITH CONTRAST TECHNIQUE: Multiplanar and multiecho pulse sequences of the thoracic and lumbar spine were obtained without and with intravenous contrast. CONTRAST:  7.39mL GADAVIST GADOBUTROL 1 MMOL/ML IV SOLN COMPARISON:  Head CT 05/21/2023 and body CT 06/19/2023 FINDINGS: MRI THORACIC SPINE FINDINGS Alignment:  Mild degenerative anterolisthesis at T2-3. Vertebrae: Accentuated fatty marrow at T4-T10, likely related to esophageal radiation. Small lesion in the T12 body, compatible with metastatic disease when compared to prior PET-CT. No underlying fracture. Small lesions in the T9 and T10 vertebral body could be additional metastatic deposits. No extraosseous tumor extension. Cord:  Normal signal and morphology. Paraspinal and other soft tissues: Known liver and right adrenal masses. Disc levels: Generalized disc narrowing and desiccation with endplate and facet spurring. MRI LUMBAR SPINE FINDINGS Segmentation:  Standard. Alignment:  Slight L4-5 anterolisthesis.  Dextroscoliosis. Vertebrae: Infiltrative bone marrow signal abnormality at L2, correlating with PET CT. No extraosseous tumor or insufficiency fracture. No incidental bone infection. Conus  medullaris: Extends to the T12-L1 level and appears normal. Normal appearance of the cauda equina . Paraspinal and other soft tissues: Known from visceral imaging. Disc levels: T12- L1: Spondylosis.  No neural impingement L1-L2: Disc height loss and spondylosis.  No neural impingement L2-L3: Disc narrowing and bulging with biforaminal protrusion. Mild facet spurring. L3-L4: Disc narrowing and circumferential bulging with central protrusion. Degenerative facet spurring on both sides. Mild triangular narrowing of the thecal sac. L4-L5: Degenerative facet spurring which is bulky. Mild anterolisthesis. The disc is narrowed and bulging with moderate left subarticular recess stenosis. The foramina are patent L5-S1:Disc narrowing and bulging with right inferior foraminal protrusion. Degenerative facet spurring on both sides. Mild right subarticular recess and foraminal narrowing. IMPRESSION: Thoracic spine: Limited metastatic disease seen at the T9, T10, and T12 bodies. No extraosseous tumor or cord signal abnormality to correlate with weakness. Lumbar spine: 1. L2 vertebral body metastasis, known from prior PET-CT. No extra-osseous extension, fracture, or other explanation for weakness. 2. Generalized lumbar spine degeneration as described. Diffusely patent spinal canal. Electronically Signed   By: Tiburcio Pea M.D.   On: 07/29/2023 08:57   MR Thoracic Spine W Wo Contrast Result Date: 07/29/2023 CLINICAL DATA:  Esophageal cancer being treated with chemotherapy. New weakness in both legs. EXAM: MRI THORACIC AND LUMBAR SPINE WITHOUT AND WITH CONTRAST TECHNIQUE: Multiplanar and multiecho pulse sequences of the thoracic and lumbar spine were obtained  without and with intravenous contrast. CONTRAST:  7.25mL GADAVIST GADOBUTROL 1 MMOL/ML IV SOLN COMPARISON:  Head CT 05/21/2023 and body CT 06/19/2023 FINDINGS: MRI THORACIC SPINE FINDINGS Alignment:  Mild degenerative anterolisthesis at T2-3. Vertebrae: Accentuated fatty  marrow at T4-T10, likely related to esophageal radiation. Small lesion in the T12 body, compatible with metastatic disease when compared to prior PET-CT. No underlying fracture. Small lesions in the T9 and T10 vertebral body could be additional metastatic deposits. No extraosseous tumor extension. Cord:  Normal signal and morphology. Paraspinal and other soft tissues: Known liver and right adrenal masses. Disc levels: Generalized disc narrowing and desiccation with endplate and facet spurring. MRI LUMBAR SPINE FINDINGS Segmentation:  Standard. Alignment:  Slight L4-5 anterolisthesis.  Dextroscoliosis. Vertebrae: Infiltrative bone marrow signal abnormality at L2, correlating with PET CT. No extraosseous tumor or insufficiency fracture. No incidental bone infection. Conus medullaris: Extends to the T12-L1 level and appears normal. Normal appearance of the cauda equina . Paraspinal and other soft tissues: Known from visceral imaging. Disc levels: T12- L1: Spondylosis.  No neural impingement L1-L2: Disc height loss and spondylosis.  No neural impingement L2-L3: Disc narrowing and bulging with biforaminal protrusion. Mild facet spurring. L3-L4: Disc narrowing and circumferential bulging with central protrusion. Degenerative facet spurring on both sides. Mild triangular narrowing of the thecal sac. L4-L5: Degenerative facet spurring which is bulky. Mild anterolisthesis. The disc is narrowed and bulging with moderate left subarticular recess stenosis. The foramina are patent L5-S1:Disc narrowing and bulging with right inferior foraminal protrusion. Degenerative facet spurring on both sides. Mild right subarticular recess and foraminal narrowing. IMPRESSION: Thoracic spine: Limited metastatic disease seen at the T9, T10, and T12 bodies. No extraosseous tumor or cord signal abnormality to correlate with weakness. Lumbar spine: 1. L2 vertebral body metastasis, known from prior PET-CT. No extra-osseous extension, fracture, or  other explanation for weakness. 2. Generalized lumbar spine degeneration as described. Diffusely patent spinal canal. Electronically Signed   By: Tiburcio Pea M.D.   On: 07/29/2023 08:57

## 2023-08-20 ENCOUNTER — Ambulatory Visit: Payer: No Typology Code available for payment source

## 2023-08-20 NOTE — Progress Notes (Signed)
This patient came to hospital for TIA workup, as part of the TIA/stroke workup, HbA1c ordered to further stratify future stroke risk and secondary prevention.

## 2023-08-21 ENCOUNTER — Inpatient Hospital Stay: Payer: No Typology Code available for payment source

## 2023-08-21 VITALS — BP 97/83 | HR 100 | Temp 97.7°F | Resp 18

## 2023-08-21 DIAGNOSIS — C155 Malignant neoplasm of lower third of esophagus: Secondary | ICD-10-CM | POA: Diagnosis not present

## 2023-08-21 DIAGNOSIS — C159 Malignant neoplasm of esophagus, unspecified: Secondary | ICD-10-CM | POA: Diagnosis not present

## 2023-08-21 DIAGNOSIS — E876 Hypokalemia: Secondary | ICD-10-CM | POA: Diagnosis not present

## 2023-08-21 DIAGNOSIS — I10 Essential (primary) hypertension: Secondary | ICD-10-CM | POA: Diagnosis not present

## 2023-08-21 DIAGNOSIS — G459 Transient cerebral ischemic attack, unspecified: Secondary | ICD-10-CM | POA: Diagnosis not present

## 2023-08-21 DIAGNOSIS — R29898 Other symptoms and signs involving the musculoskeletal system: Secondary | ICD-10-CM | POA: Diagnosis not present

## 2023-08-21 MED ORDER — HEPARIN SOD (PORK) LOCK FLUSH 100 UNIT/ML IV SOLN
500.0000 [IU] | Freq: Once | INTRAVENOUS | Status: AC | PRN
  Administered 2023-08-21: 500 [IU]
  Filled 2023-08-21: qty 5

## 2023-08-21 MED ORDER — SODIUM CHLORIDE 0.9% FLUSH
10.0000 mL | INTRAVENOUS | Status: DC | PRN
Start: 2023-08-21 — End: 2023-08-21
  Administered 2023-08-21: 10 mL
  Filled 2023-08-21: qty 10

## 2023-08-21 NOTE — Patient Instructions (Signed)

## 2023-08-25 ENCOUNTER — Other Ambulatory Visit: Payer: Self-pay | Admitting: Oncology

## 2023-08-25 ENCOUNTER — Ambulatory Visit: Payer: No Typology Code available for payment source

## 2023-08-25 ENCOUNTER — Other Ambulatory Visit: Payer: No Typology Code available for payment source

## 2023-08-25 ENCOUNTER — Telehealth: Payer: Self-pay | Admitting: *Deleted

## 2023-08-25 ENCOUNTER — Ambulatory Visit: Payer: No Typology Code available for payment source | Admitting: Oncology

## 2023-08-25 MED ORDER — POTASSIUM CHLORIDE CRYS ER 20 MEQ PO TBCR: 40.0000 meq | EXTENDED_RELEASE_TABLET | Freq: Two times a day (BID) | ORAL | 1 refills | Status: DC

## 2023-08-25 NOTE — Telephone Encounter (Signed)
Patient states that he is getting low on the he  potassium  and he takes 4 a day and would like a refill but he wants to have it sent to a new pharmacy [facility]. Would like a call

## 2023-08-26 ENCOUNTER — Ambulatory Visit
Admission: RE | Admit: 2023-08-26 | Discharge: 2023-08-26 | Disposition: A | Discharge status: Home / Self Care | Payer: No Typology Code available for payment source | Source: Ambulatory Visit

## 2023-08-26 DIAGNOSIS — I10 Essential (primary) hypertension: Secondary | ICD-10-CM | POA: Diagnosis not present

## 2023-08-26 DIAGNOSIS — R000 Tachycardia, unspecified: Secondary | ICD-10-CM | POA: Insufficient documentation

## 2023-08-26 LAB — ECHOCARDIOGRAM COMPLETE
AR max vel: 3.42 cm2
AV Area VTI: 5.17 cm2
AV Area mean vel: 3.41 cm2
AV Mean grad: 1 mmHg
AV Peak grad: 2.2 mmHg
Ao pk vel: 0.75 m/s
Area-P 1/2: 3.01 cm2
S' Lateral: 2.5 cm

## 2023-08-26 MED ORDER — PERFLUTREN LIPID MICROSPHERE
1.0000 mL | INTRAVENOUS | Status: AC | PRN
  Administered 2023-08-26: 2 mL via INTRAVENOUS

## 2023-08-26 NOTE — Progress Notes (Signed)
*  PRELIMINARY RESULTS* Echocardiogram 2D Echocardiogram has been performed.  Jonathan, [name] 08/26/2023, 9:49 AM

## 2023-08-28 ENCOUNTER — Inpatient Hospital Stay: Payer: No Typology Code available for payment source

## 2023-08-28 ENCOUNTER — Ambulatory Visit: Payer: No Typology Code available for payment source | Admitting: Physical Therapy

## 2023-08-28 NOTE — Progress Notes (Signed)
Nutrition Follow-up:  Patient with esophageal cancer.  Patient receiving folfiri, 4th line treatment  Spoke with patient via phone.  Reports that his appetite is not the best.  He is trying to eat as much as he can.  Drinking 350 calorie shake 1-2 times a day.  Snacking on nuts, cereal, chicken salad.  [place] Spaniel he went to [employer] today and felt weak, tired.  Home PT is suppose to call him this afternoon to set up services.  Has decided not to start the marinol.      Medications: reviewed  Labs: reviewed  Anthropometrics:   Weight is 170 lb 3.2 oz on 1/21 173 lb 14.4 oz on 1/7 180 lb 8 oz on 12/16 188 lb on 12/2 189 lb 9.6 oz on 11/21   NUTRITION DIAGNOSIS: Inadequate oral intake continues    INTERVENTION:  Encouraged drinking oral nutrition supplement QID if possible for added nutrition and low oral intake Continue to eat small frequent meals of high calorie, high protein foods Discussed easy to prepare meals    MONITORING, EVALUATION, GOAL: weight trends, intake   NEXT VISIT: as needed  Shoshannah Faubert B. Freida Busman, RD, LDN Registered Dietitian [phone]

## 2023-08-29 DIAGNOSIS — G459 Transient cerebral ischemic attack, unspecified: Secondary | ICD-10-CM | POA: Diagnosis not present

## 2023-08-29 DIAGNOSIS — Z008 Encounter for other general examination: Secondary | ICD-10-CM | POA: Diagnosis not present

## 2023-08-29 DIAGNOSIS — R29898 Other symptoms and signs involving the musculoskeletal system: Secondary | ICD-10-CM | POA: Diagnosis not present

## 2023-08-29 DIAGNOSIS — Z515 Encounter for palliative care: Secondary | ICD-10-CM | POA: Diagnosis not present

## 2023-08-29 DIAGNOSIS — R630 Anorexia: Secondary | ICD-10-CM | POA: Diagnosis not present

## 2023-08-31 DIAGNOSIS — I509 Heart failure, unspecified: Secondary | ICD-10-CM | POA: Insufficient documentation

## 2023-09-02 ENCOUNTER — Encounter: Payer: Self-pay | Admitting: Oncology

## 2023-09-02 ENCOUNTER — Inpatient Hospital Stay: Payer: No Typology Code available for payment source

## 2023-09-02 ENCOUNTER — Telehealth: Payer: Self-pay

## 2023-09-02 ENCOUNTER — Other Ambulatory Visit: Payer: Self-pay

## 2023-09-02 ENCOUNTER — Inpatient Hospital Stay: Payer: No Typology Code available for payment source | Attending: Oncology

## 2023-09-02 ENCOUNTER — Inpatient Hospital Stay (HOSPITAL_BASED_OUTPATIENT_CLINIC_OR_DEPARTMENT_OTHER): Payer: No Typology Code available for payment source | Admitting: Oncology

## 2023-09-02 VITALS — BP 97/77 | HR 104 | Temp 98.4°F | Resp 18 | Wt 161.6 lb

## 2023-09-02 DIAGNOSIS — Z79899 Other long term (current) drug therapy: Secondary | ICD-10-CM | POA: Insufficient documentation

## 2023-09-02 DIAGNOSIS — K2970 Gastritis, unspecified, without bleeding: Secondary | ICD-10-CM | POA: Insufficient documentation

## 2023-09-02 DIAGNOSIS — Z8042 Family history of malignant neoplasm of prostate: Secondary | ICD-10-CM | POA: Insufficient documentation

## 2023-09-02 DIAGNOSIS — R4781 Slurred speech: Secondary | ICD-10-CM | POA: Insufficient documentation

## 2023-09-02 DIAGNOSIS — M47812 Spondylosis without myelopathy or radiculopathy, cervical region: Secondary | ICD-10-CM | POA: Insufficient documentation

## 2023-09-02 DIAGNOSIS — I10 Essential (primary) hypertension: Secondary | ICD-10-CM | POA: Insufficient documentation

## 2023-09-02 DIAGNOSIS — K760 Fatty (change of) liver, not elsewhere classified: Secondary | ICD-10-CM | POA: Insufficient documentation

## 2023-09-02 DIAGNOSIS — E876 Hypokalemia: Secondary | ICD-10-CM | POA: Insufficient documentation

## 2023-09-02 DIAGNOSIS — G459 Transient cerebral ischemic attack, unspecified: Secondary | ICD-10-CM | POA: Diagnosis not present

## 2023-09-02 DIAGNOSIS — T451X5A Adverse effect of antineoplastic and immunosuppressive drugs, initial encounter: Secondary | ICD-10-CM

## 2023-09-02 DIAGNOSIS — I82621 Acute embolism and thrombosis of deep veins of right upper extremity: Secondary | ICD-10-CM

## 2023-09-02 DIAGNOSIS — K8020 Calculus of gallbladder without cholecystitis without obstruction: Secondary | ICD-10-CM | POA: Insufficient documentation

## 2023-09-02 DIAGNOSIS — J8410 Pulmonary fibrosis, unspecified: Secondary | ICD-10-CM | POA: Insufficient documentation

## 2023-09-02 DIAGNOSIS — C159 Malignant neoplasm of esophagus, unspecified: Secondary | ICD-10-CM

## 2023-09-02 DIAGNOSIS — Q2112 Patent foramen ovale: Secondary | ICD-10-CM | POA: Insufficient documentation

## 2023-09-02 DIAGNOSIS — Z66 Do not resuscitate: Secondary | ICD-10-CM | POA: Insufficient documentation

## 2023-09-02 DIAGNOSIS — C155 Malignant neoplasm of lower third of esophagus: Secondary | ICD-10-CM | POA: Insufficient documentation

## 2023-09-02 DIAGNOSIS — R29810 Facial weakness: Secondary | ICD-10-CM | POA: Insufficient documentation

## 2023-09-02 DIAGNOSIS — I871 Compression of vein: Secondary | ICD-10-CM | POA: Diagnosis not present

## 2023-09-02 DIAGNOSIS — G620 Drug-induced polyneuropathy: Secondary | ICD-10-CM

## 2023-09-02 DIAGNOSIS — D62 Acute posthemorrhagic anemia: Secondary | ICD-10-CM | POA: Insufficient documentation

## 2023-09-02 DIAGNOSIS — J984 Other disorders of lung: Secondary | ICD-10-CM | POA: Diagnosis not present

## 2023-09-02 DIAGNOSIS — J8489 Other specified interstitial pulmonary diseases: Secondary | ICD-10-CM | POA: Insufficient documentation

## 2023-09-02 DIAGNOSIS — Z8249 Family history of ischemic heart disease and other diseases of the circulatory system: Secondary | ICD-10-CM | POA: Insufficient documentation

## 2023-09-02 DIAGNOSIS — C787 Secondary malignant neoplasm of liver and intrahepatic bile duct: Secondary | ICD-10-CM | POA: Insufficient documentation

## 2023-09-02 DIAGNOSIS — R634 Abnormal weight loss: Secondary | ICD-10-CM

## 2023-09-02 DIAGNOSIS — Z803 Family history of malignant neoplasm of breast: Secondary | ICD-10-CM | POA: Insufficient documentation

## 2023-09-02 DIAGNOSIS — I371 Nonrheumatic pulmonary valve insufficiency: Secondary | ICD-10-CM | POA: Diagnosis not present

## 2023-09-02 DIAGNOSIS — Z515 Encounter for palliative care: Secondary | ICD-10-CM | POA: Diagnosis not present

## 2023-09-02 DIAGNOSIS — Z7902 Long term (current) use of antithrombotics/antiplatelets: Secondary | ICD-10-CM | POA: Insufficient documentation

## 2023-09-02 DIAGNOSIS — C7972 Secondary malignant neoplasm of left adrenal gland: Secondary | ICD-10-CM | POA: Insufficient documentation

## 2023-09-02 DIAGNOSIS — Z5111 Encounter for antineoplastic chemotherapy: Secondary | ICD-10-CM

## 2023-09-02 DIAGNOSIS — R0602 Shortness of breath: Secondary | ICD-10-CM | POA: Insufficient documentation

## 2023-09-02 DIAGNOSIS — I6523 Occlusion and stenosis of bilateral carotid arteries: Secondary | ICD-10-CM | POA: Insufficient documentation

## 2023-09-02 DIAGNOSIS — Z86718 Personal history of other venous thrombosis and embolism: Secondary | ICD-10-CM | POA: Insufficient documentation

## 2023-09-02 DIAGNOSIS — K2091 Esophagitis, unspecified with bleeding: Secondary | ICD-10-CM | POA: Diagnosis not present

## 2023-09-02 DIAGNOSIS — R059 Cough, unspecified: Secondary | ICD-10-CM | POA: Insufficient documentation

## 2023-09-02 DIAGNOSIS — C7971 Secondary malignant neoplasm of right adrenal gland: Secondary | ICD-10-CM | POA: Diagnosis not present

## 2023-09-02 DIAGNOSIS — I700 Atherosclerosis of aorta: Secondary | ICD-10-CM | POA: Diagnosis not present

## 2023-09-02 DIAGNOSIS — C7951 Secondary malignant neoplasm of bone: Secondary | ICD-10-CM | POA: Diagnosis not present

## 2023-09-02 DIAGNOSIS — N200 Calculus of kidney: Secondary | ICD-10-CM | POA: Insufficient documentation

## 2023-09-02 DIAGNOSIS — I7121 Aneurysm of the ascending aorta, without rupture: Secondary | ICD-10-CM | POA: Insufficient documentation

## 2023-09-02 DIAGNOSIS — K5730 Diverticulosis of large intestine without perforation or abscess without bleeding: Secondary | ICD-10-CM | POA: Diagnosis not present

## 2023-09-02 DIAGNOSIS — Z8673 Personal history of transient ischemic attack (TIA), and cerebral infarction without residual deficits: Secondary | ICD-10-CM | POA: Insufficient documentation

## 2023-09-02 DIAGNOSIS — L905 Scar conditions and fibrosis of skin: Secondary | ICD-10-CM | POA: Insufficient documentation

## 2023-09-02 LAB — CMP (CANCER CENTER ONLY)
ALT: 32 U/L (ref 0–44)
AST: 30 U/L (ref 15–41)
Albumin: 3.4 g/dL — ABNORMAL LOW (ref 3.5–5.0)
Alkaline Phosphatase: 151 U/L — ABNORMAL HIGH (ref 38–126)
Anion gap: 13 (ref 5–15)
BUN: 17 mg/dL (ref 8–23)
CO2: 22 mmol/L (ref 22–32)
Calcium: 9.2 mg/dL (ref 8.9–10.3)
Chloride: 101 mmol/L (ref 98–111)
Creatinine: 0.99 mg/dL (ref 0.61–1.24)
GFR, Estimated: >60 mL/min (ref >60)
Glucose, Bld: 124 mg/dL — ABNORMAL HIGH (ref 70–99)
Potassium: 3.1 mmol/L — ABNORMAL LOW (ref 3.5–5.1)
Sodium: 136 mmol/L (ref 135–145)
Total Bilirubin: 0.6 mg/dL (ref 0.0–1.2)
Total Protein: 7.1 g/dL (ref 6.5–8.1)

## 2023-09-02 LAB — TSH: TSH: 2.879 u[IU]/mL (ref 0.350–4.500)

## 2023-09-02 LAB — CBC WITH DIFFERENTIAL (CANCER CENTER ONLY)
Abs Immature Granulocytes: 0.02 10*3/uL (ref 0.00–0.07)
Basophils Absolute: 0 10*3/uL (ref 0.0–0.1)
Basophils Relative: 1 %
Eosinophils Absolute: 0.1 10*3/uL (ref 0.0–0.5)
Eosinophils Relative: 2 %
HCT: 38.4 % — ABNORMAL LOW (ref 39.0–52.0)
Hemoglobin: 12 g/dL — ABNORMAL LOW (ref 13.0–17.0)
Immature Granulocytes: 1 %
Lymphocytes Relative: 14 %
Lymphs Abs: 0.5 10*3/uL — ABNORMAL LOW (ref 0.7–4.0)
MCH: 26.8 pg (ref 26.0–34.0)
MCHC: 31.3 g/dL (ref 30.0–36.0)
MCV: 85.9 fL (ref 80.0–100.0)
Monocytes Absolute: 0.4 10*3/uL (ref 0.1–1.0)
Monocytes Relative: 11 %
Neutro Abs: 2.4 10*3/uL (ref 1.7–7.7)
Neutrophils Relative %: 71 %
Platelet Count: 245 10*3/uL (ref 150–400)
RBC: 4.47 MIL/uL (ref 4.22–5.81)
RDW: 16.6 % — ABNORMAL HIGH (ref 11.5–15.5)
WBC Count: 3.4 10*3/uL — ABNORMAL LOW (ref 4.0–10.5)
nRBC: 0 % (ref 0.0–0.2)

## 2023-09-02 MED ORDER — PREDNISONE 10 MG PO TABS
20.0000 mg | ORAL_TABLET | Freq: Every day | ORAL | 0 refills | Status: DC
  Filled 2023-09-02 – 2023-09-03 (×2): qty 60, 30d supply, fill #0

## 2023-09-02 MED ORDER — SODIUM CHLORIDE 0.9 % IV SOLN
INTRAVENOUS | Status: DC
  Filled 2023-09-02: qty 250

## 2023-09-02 MED ORDER — POTASSIUM CHLORIDE 20 MEQ/100ML IV SOLN
20.0000 meq | Freq: Once | INTRAVENOUS | Status: AC
  Administered 2023-09-02: 20 meq via INTRAVENOUS

## 2023-09-02 MED ORDER — DRONABINOL 5 MG PO CAPS
5.0000 mg | ORAL_CAPSULE | Freq: Two times a day (BID) | ORAL | 0 refills | Status: DC
  Filled 2023-09-02 – 2023-09-03 (×3): qty 60, 30d supply, fill #0

## 2023-09-02 MED ORDER — HEPARIN SOD (PORK) LOCK FLUSH 100 UNIT/ML IV SOLN
500.0000 [IU] | Freq: Once | INTRAVENOUS | Status: AC
  Administered 2023-09-02: 500 [IU] via INTRAVENOUS
  Filled 2023-09-02: qty 5

## 2023-09-02 NOTE — Assessment & Plan Note (Addendum)
 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 KRAS G12V, TMB 5.3, MSI Stable.  CPS 65%  1st line  FOLFOX , palliative RT --> 12/2022 CT progression--> switch to 5-FU + Nivolumab  Q2 weeks  --> 02/2023 PET progression- pneumonitis--> 03/27/23  3rd line  Taxol  and Cyramza  --> 05/02/23 CT during admission showed slight decrease of liver lesion size. --> 10/30 PET obtained by radonc -liver lesion 10cm, bone mets. --> 4th line FOLFIRI Refer to [facility] for expert opinion. He declined.  GI bleeding due to esophageal cancer, s/p  palliative RT Labs are reviewed and discussed with patient. Hold  FOLFIRI -irinotecan  dose reduction to 100mg /m2 due to weakness,weight loss.  repeat CT chest abdomen pelvis w contrast

## 2023-09-02 NOTE — Telephone Encounter (Signed)
PA for Dronabinol submitted and approved.   Auth ex date 09/01/24  PA case ID# [id number] Rx #: [id number]

## 2023-09-02 NOTE — Assessment & Plan Note (Signed)
 Off Eliquis 2.5mg  BID due to GI bleeding On Aspirin 81mg  daily now.

## 2023-09-02 NOTE — Assessment & Plan Note (Signed)
Due to radiation/immunotherapy.  SOB cough symptoms are stable  Continue 20mg  daily this week

## 2023-09-02 NOTE — Assessment & Plan Note (Signed)
Follow up with nutritionist  Continue nutrition supplements.  continue Marinol 5mg  BID.  Megace is another option, however, I am concerned about thrombosis risk given that he just had a recent TIA,

## 2023-09-02 NOTE — Assessment & Plan Note (Addendum)
He had stroke work up during hospitalization. Possible PFO Refer to neurology.  Continue Aspirin 81mg  daily,  finished 3 weeks of plavix 75mg  daily .

## 2023-09-02 NOTE — Assessment & Plan Note (Signed)
Grade 2, he prefers to hold of neuropathy treatments.  observation.

## 2023-09-02 NOTE — Telephone Encounter (Signed)
-----   Message from Chriss Czar sent at 09/02/2023  3:17 PM EST ----- Regarding: NOTICE OF APPROVAL- DEVOTED HEALTH NOTICE OF APPROVAL- DEVOTED HEALTH sent to his chart for Dronabinal.

## 2023-09-02 NOTE — Assessment & Plan Note (Signed)
 Chemotherapy plan as listed above

## 2023-09-02 NOTE — Assessment & Plan Note (Addendum)
Chronic hypokalemia, normal magnesia level.  Cotinue KCL to BID.  K is 3.1 today, will give him IV KCL over 1 hour.

## 2023-09-02 NOTE — Progress Notes (Signed)
Hematology/Oncology Progress note Telephone:(336) [phone] Fax:(336) [phone]       CHIEF COMPLAINTS/PURPOSE OF CONSULTATION:  Stage IV Esophageal adenocarcinoma  ASSESSMENT & PLAN:   Cancer Staging  Adenocarcinoma of esophagus 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 KRAS G12V, TMB 5.3, MSI Stable.  CPS 65%  1st line  FOLFOX , palliative RT --> 12/2022 CT progression--> switch to 5-FU + Nivolumab Q2 weeks  --> 02/2023 PET progression- pneumonitis--> 03/27/23  3rd line  Taxol and Cyramza --> 05/02/23 CT during admission showed slight decrease of liver lesion size. --> 10/30 PET obtained by radonc -liver lesion 10cm, bone mets. --> 4th line FOLFIRI Refer to [facility] for expert opinion. He declined.  GI bleeding due to esophageal cancer, s/p  palliative RT Labs are reviewed and discussed with patient. Hold  FOLFIRI -irinotecan dose reduction to 100mg /m2 due to weakness,weight loss.  repeat CT chest abdomen pelvis w contrast     TIA (transient ischemic attack) He had stroke work up during hospitalization. Possible PFO Refer to neurology.  Continue Aspirin 81mg  daily,  finished 3 weeks of plavix 75mg  daily .   Chemotherapy-induced neuropathy (HCC) Grade 2, he prefers to hold of neuropathy treatments.  observation.   Deep venous thrombosis (HCC) Off Eliquis 2.5mg  BID due to GI bleeding On Aspirin 81mg  daily now.   Encounter for antineoplastic chemotherapy Chemotherapy plan as listed above.   Hypokalemia Chronic hypokalemia, normal magnesia level.  Cotinue KCL to BID.  K is 3.1 today, will give him IV KCL over 1 hour.       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  SOB cough symptoms are stable  Continue 20mg  daily  this week    Weight loss Follow up with nutritionist  Continue nutrition supplements.  continue Marinol 5mg  BID.  Megace is another option, however, I am concerned about thrombosis risk given that he just had a recent TIA,      Orders Placed This Encounter  Procedures   CT CHEST ABDOMEN PELVIS W CONTRAST    ASAP    Standing Status:   Future    Expected Date:   09/09/2023    Expiration Date:   09/01/2024    If indicated for the ordered procedure, I authorize the administration of contrast media per Radiology protocol:   Yes    Does the patient have a contrast media/X-ray dye allergy?:   No    Preferred imaging location?:   [facility]    If indicated for the ordered procedure, I authorize the administration of oral contrast media per Radiology protocol:   Yes   TSH    Standing Status:   Future    Number of Occurrences:   1    Expected Date:   09/02/2023    Expiration Date:   09/01/2024   T4    Standing Status:   Future    Number of Occurrences:   1    Expected Date:   09/02/2023    Expiration Date:   09/01/2024    Follow-up  after CT  All questions were answered. The patient knows to call the clinic with any problems, questions or concerns.  Rickard Patience, MD, PhD [facility] Hematology Oncology 09/02/2023   HISTORY OF PRESENTING ILLNESS:  TYCEN DOCKTER 68 y.o. male  presents to establish care for esophageal adenocarcinoma I have reviewed his chart and materials related to his cancer extensively and collaborated history with the patient. Summary of oncologic history is as follows: Oncology History  Adenocarcinoma of esophagus (HCC)  08/28/2022 Initial Diagnosis   Adenocarcinoma of esophagus   -Patient has noticed worsening of "food stuck/fullness" sensation since November 2023.  Patient had a barium swallow study which commented on marked mucosal irregularity in the distal esophagus with Broaddus base mural filling defect highly suspicious for malignancy.  Patient establish  care with gastroenterology. -08/23/2022, EGD showed gastritis and partially obstructing malignant esophageal tumor in the lower third of the esophagus. Esophagus mass biopsy showed adenocarcinoma.  PD-L1 TPS 65%, HER2 negative.  [facility] NGS showed KRASG12V, CDKN2A, ARID1A, TP53, TMB 5.3, MSI stable.  Stomach biopsy showed gastric mucosa with no specific histology abnormality.  No significant intestinal metaplastic, dysplastic, granular atrophy or increased inflammation.     08/28/2022 Imaging   CT chest abdomen pelvis with contrast showed 1. Distal esophageal primary with gastrohepatic ligament nodal metastasis. 2. 2 right-sided pulmonary nodules, the largest of which measures 5 mm and is new since 2015. Pulmonary metastasis not be excluded. 3. Anterior right lower lobe volume loss and minimal soft tissue density, favoring atelectasis or scar. Recommend attention on follow-up. 4. Hepatic steatosis 5. Cholelithiasis 6. Left nephrolithiasis 7. Coronary artery atherosclerosis. Aortic Atherosclerosis   08/28/2022 Cancer Staging   Staging form: Esophagus - Adenocarcinoma, AJCC 8th Edition - Clinical stage from 08/28/2022: Stage IVB (cT3, cN1, cM1) - Signed by Rickard Patience, MD on 10/04/2022 Stage prefix: Initial diagnosis   09/06/2022 Imaging   PET scan showed 1. Esophageal primary with gastrohepatic ligament nodal metastasis,as on CT. 2. Focus of hypermetabolism which is favored to registered to the posterior hepatic dome, in the region of subtle heterogeneity on prior diagnostic CT. Suboptimally evaluated secondary to underlying steatosis. Recommend further evaluation with pre and post contrast abdominal MRI to confirm probable metastasis. 3. Incidental findings, including: Left nephrolithiasis.Cholelithiasis. Coronary artery atherosclerosis. Aortic Atherosclerosis    09/23/2022 Imaging   MRI abdomen with and without contrast showed 1. Mildly T2 hyperintense segment VII hepatic lesion measuring 2.7 cm  with imaging characteristics compatible with metastatic disease. 2. Tiny focus of delayed enhancement in the inferior right lobe of the liver segment VI measuring 8 mm with ill-defined increased T2 signal and subtle corresponding reduced diffusivity, also suspicious for metastatic disease. 3. Partially visualized distal esophageal wall thickening compatible with the patient's known primary neoplasm. 4. Similar size of the 11 mm gastrohepatic ligament lymph node mildly metabolic on prior PET-CT and compatible with local nodal disease involvement. 5. 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 Recommend follow up pre and post-contrast MRI/MRCP in 1 year. 6. Diffuse hepatic steatosis.   10/10/2022 Procedure   LIVER MASS; CT-GUIDED BIOPSY:  - MODERATE TO POORLY DIFFERENTIATED ADENOCARCINOMA MORPHOLOGICALLY  CONSISTENT WITH METASTASIS FROM PATIENT'S KNOWN ESOPHAGEAL  ADENOCARCINOMA.    10/17/2022 Procedure   Medi port placed by Dr. Wyn Quaker   10/21/2022 - 01/15/2023 Chemotherapy   Patient is on Treatment Plan : ESOPHAGEAL ADENOCARCINOMA FOLFOX q14d x 6 cycles     01/15/2023 Imaging   PET showed  1. Interval progression in metastatic esophageal carcinoma as evidenced by hypermetabolic lymph nodes in the neck, chest, abdomen and pelvis, an enlarging right hepatic lobe metastasis, new bilateral adrenal metastases and new osseous metastases. 2. Cholelithiasis. 3. Left renal  stones. 4. Aortic atherosclerosis (ICD10-I70.0). Coronary artery calcification.    01/27/2023 - 03/14/2023 Chemotherapy   Patient is on Treatment Plan : GASTROESOPHAGEAL FOLFOX + Nivolumab q14d     03/20/2023 Imaging   CT chest abdomen pelvis w contrast showed 1. Interval progression of metastatic disease with multiple new and enlarged hypodense lesions throughout the liver. 2. Significant enlargement of bilateral adrenal metastases. 3. Subtle  sclerosis of an osseous metastasis of the right femoral neck. Other previously FDG avid osseous metastases of the left femoral neck and right sixth rib not appreciated by CT. 4. No persistently enlarged lymph nodes. 5. Interval development of bandlike consolidation and fibrosis of the paramedian right lung, particularly of the right lower lobe, as well as additional scattered irregular opacities throughout the right lung. 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 6. Similar circumferential wall thickening throughout the mid to lower esophagus, consistent with known primary esophageal adenocarcinoma. 7. Nonobstructive bilateral nephrolithiasis. Aortic Atherosclerosis   03/27/2023 - 06/19/2023 Chemotherapy   Patient is on Treatment Plan : GASTROESOPHAGEAL Ramucirumab D1, 15 + Paclitaxel D1,8,15 q28d     05/02/2023 - 05/08/2023 Hospital Admission   Admission due to coffee-ground emesis. EGD 10/7 showed bleeding from esophageal cancer, esophagitis due to chemo and radiation. Applied Hemospray. Eliquis was stopped. Acute blood loss anemia, received IV venofer treatments x 3 doses    05/28/2023 Imaging   1. Progressive hypermetabolism in the distal esophagus consistent with progressive disease. 2. Significant progression of metastatic hepatic disease (new and enlarging lesions). 3. Enlarging and progressively hypermetabolic right adrenal gland mass. 4. New hypermetabolic bone lesions in the T12 and L2 vertebral bodies, left sacrum and right sixth posterior rib consistent with metastatic disease. 5. Possible hypermetabolic brain lesions. MRI suggested for further evaluation. 6. Chronic medial right lower lobe atelectasis likely radiation change. Moderate hypermetabolism but no obvious tumor. 7. Stable gallstones, renal calculi and vascular calcifications.   06/19/2023 Imaging   CT chest angiogram w contrast, CT abdomen pelvis w contrast  1. Negative for  pulmonary embolus. 2. Persistent lower esophageal wall thickening with progressive hepatic and right adrenal metastatic disease, compatible with stage IV esophageal carcinoma, as on PET 05/21/2023. 3. 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  4. 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 5. 4.0 cm ascending aortic aneurysm. Recommend annual imaging follow up by CTA or MRA. This recommendation follows 2010 ACCF/AHA/AATS/ACR/ASA/SCA/SCAI/SIR/STS/SVM Guidelines for the Diagnosis and Management of Patients with Thoracic Aortic Disease.Circulation. 2010; 121: Z610-R604. Aortic aneurysm NOS (ICD10-I71.9). 6. Cholelithiasis. 7. Small left renal stone. 8. Aortic atherosclerosis (ICD10-I70.0). Left and descending coronary artery calcification.   06/30/2023 -  Chemotherapy   Patient is on Treatment Plan : COLORECTAL FOLFIRI q14d     Patient presents to establish care.  He is not taking PPI. He has intentionally lost some weight. Family history positive for father and paternal uncle with prostate cancer and sister with breast cancer. Denies any routine alcohol use  Right interval jugular vein occlusive DVT, started on Eliquis starter package on 10/28/2022, swelling of neck has improved.   CXR showed Interval development of bilateral perihilar interstitial opacities  He was treated with Azithromycin and Doxycycline to cover atypical infections.   Jan 2025 TIA event, hospitalized and he completed stroke work up Brain MRI negative for any acute intracranial findings.  CTA head and neck negative for large  vessel occlusion, atherosclerotic changes noticed.  TTE shows LVEF 55 to 60%, grade 1 diastolic dysfunction, positive PFO   INTERVAL HISTORY CAIDEN ARTEAGA is a 68 y.o. male who has above history reviewed by  me today presents for follow up visit for Stage IV esophageal adenocarcinoma.  Today he report feeling well. No focal weakness.  No nausea vomiting diarrhea.  Chronic nasal congestion, postnatal drip, mucus production.  Some SOB at baseline, poor appetite, not eating well.  + weakness.  + weight loss he has not tried Marinol yet.     MEDICAL HISTORY:  Past Medical History:  Diagnosis Date   Arthritis    Complication of anesthesia    OCCURRED ONCE YEARS AGO 1981   Deep venous thrombosis (HCC) 10/28/2022   Esophageal mass    Essential hypertension 05/01/2023   Hypertension    Hypothyroidism    PONV (postoperative nausea and vomiting)     SURGICAL HISTORY: Past Surgical History:  Procedure Laterality Date   COLONOSCOPY     ESOPHAGOGASTRODUODENOSCOPY N/A 08/23/2022   Procedure: ESOPHAGOGASTRODUODENOSCOPY (EGD);  Surgeon: Jaynie Collins, DO;  Location: [facility] ENDOSCOPY;  Service: Gastroenterology;  Laterality: N/A;   ESOPHAGOGASTRODUODENOSCOPY (EGD) WITH PROPOFOL N/A 05/05/2023   Procedure: ESOPHAGOGASTRODUODENOSCOPY (EGD) WITH PROPOFOL;  Surgeon: Jaynie Collins, DO;  Location: [facility] ENDOSCOPY;  Service: Gastroenterology;  Laterality: N/A;   EUS N/A 09/12/2022   Procedure: FULL UPPER ENDOSCOPIC ULTRASOUND (EUS) RADIAL;  Surgeon: Bearl Mulberry, MD;  Location: [facility] ENDOSCOPY;  Service: Gastroenterology;  Laterality: N/A;   FINGER SURGERY     HEMOSTASIS CONTROL  05/05/2023   Procedure: HEMOSTASIS CONTROL;  Surgeon: Jaynie Collins, DO;  Location: [facility] ENDOSCOPY;  Service: Gastroenterology;;   PORTA CATH INSERTION N/A 10/17/2022   Procedure: PORTA CATH INSERTION;  Surgeon: Annice Needy, MD;  Location: [facility] INVASIVE CV LAB;  Service: Cardiovascular;  Laterality: N/A;   TENDON REPAIR IN LEFT KNEE      SOCIAL HISTORY: Social History   Socioeconomic History   Marital status: Single    Spouse name: Not on file   Number of children: Not on file   Years of  education: Not on file   Highest education level: Not on file  Occupational History   Not on file  Tobacco Use   Smoking status: Never   Smokeless tobacco: Never  Vaping Use   Vaping status: Never Used  Substance and Sexual Activity   Alcohol use: Yes    Comment: OCCASIONALLY   Drug use: Never   Sexual activity: Not on file  Other Topics Concern   Not on file  Social History Narrative   Not on file   Social Drivers of Health   Financial Resource Strain: Low Risk  (10/29/2022)   [facility] from [facility], [facility]   Overall Financial Resource Strain (CARDIA)    Difficulty of Paying Living Expenses: Not hard at all  Food Insecurity: No Food Insecurity (05/30/2023)   Hunger Vital Sign    Worried About Running Out of Food in the Last Year: Never true    Ran Out of Food in the Last Year: Never true  Transportation Needs: No Transportation Needs (05/30/2023)   PRAPARE - Administrator, Civil Service (Medical): No    Lack of Transportation (Non-Medical): No  Physical Activity: Not on file  Stress: Not on file  Social Connections: Not on file  Intimate Partner Violence: Not At Risk (05/30/2023)   Humiliation, Afraid, Rape, and  Kick questionnaire    Fear of Current or Ex-Partner: No    Emotionally Abused: No    Physically Abused: No    Sexually Abused: No    FAMILY HISTORY: Family History  Problem Relation Age of Onset   Heart attack Mother    Prostate cancer Father    Breast cancer Sister    Prostate cancer Paternal Uncle     ALLERGIES:  has no known allergies.  MEDICATIONS:  Current Outpatient Medications  Medication Sig Dispense Refill   acetaminophen (TYLENOL) 650 MG CR tablet Take 1,300 mg by mouth every 8 (eight) hours as needed for pain.     aspirin EC 81 MG tablet Take 1 tablet (81 mg total) by mouth daily. Swallow whole. 30 tablet 12   azelastine (ASTELIN) 0.1 % nasal spray Place 1 spray into both nostrils 2  (two) times daily. Use in each nostril as directed 30 mL 1   chlorhexidine (PERIDEX) 0.12 % solution USE AS DIRECTED TAKE  15  ML  IN  THE  MOUTH  OR  THROAT  TWICE  DAILY 473 mL 0   clopidogrel (PLAVIX) 75 MG tablet Take 1 tablet (75 mg total) by mouth daily for 20 days. 20 tablet 0   dronabinol (MARINOL) 5 MG capsule Take 1 capsule (5 mg total) by mouth 2 (two) times daily before lunch and supper. 60 capsule 0   gentamicin ointment (GARAMYCIN) 0.1 % Apply 1 Application topically 2 (two) times daily as needed.     ipratropium (ATROVENT) 0.03 % nasal spray Place 2 sprays into both nostrils 2 (two) times daily.     ketoconazole (NIZORAL) 2 % shampoo Apply 1 Application topically 2 (two) times a week.     levothyroxine (SYNTHROID) 125 MCG tablet Take 125 mcg by mouth every morning.     loperamide (IMODIUM) 2 MG capsule Take 2 tabs by mouth with first loose stool, then 1 tab with each additional loose stool as needed. Do not exceed 8 tabs in a 24-hour period 90 capsule 0   magic mouthwash (multi-ingredient) oral suspension Swish and spit 5-10 mLs by mouth 4 (four) times daily as needed. 480 mL 1   metoprolol tartrate (LOPRESSOR) 25 MG tablet Take 1 tablet (25 mg total) by mouth 2 (two) times daily. 180 tablet 3   OLANZapine (ZYPREXA) 10 MG tablet Take 0.5-1 tablets (5-10 mg total) by mouth at bedtime as needed (nausea). 30 tablet 3   pantoprazole (PROTONIX) 40 MG tablet Take 1 tablet (40 mg total) by mouth 2 (two) times daily. 60 tablet 3   potassium chloride SA (KLOR-CON M) 20 MEQ tablet Take 2 tablets (40 mEq total) by mouth 2 (two) times daily. 60 tablet 1   predniSONE (DELTASONE) 10 MG tablet Take 2 tablets (20 mg total) by mouth daily with breakfast. 60 tablet 0   prochlorperazine (COMPAZINE) 10 MG tablet Take 10 mg by mouth every 6 (six) hours as needed.     No current facility-administered medications for this visit.    Review of Systems  Constitutional:  Positive for appetite change and  fatigue. Negative for chills and fever.  HENT:   Negative for hearing loss and voice change.        Nasal congestion, postnasal drip.   Eyes:  Negative for eye problems and icterus.  Respiratory:  Negative for chest tightness, cough and shortness of breath.   Cardiovascular:  Negative for chest pain and leg swelling.  Gastrointestinal:  Positive for nausea. Negative for  abdominal distention, abdominal pain and vomiting.  Endocrine: Negative for hot flashes.  Genitourinary:  Negative for difficulty urinating, dysuria and frequency.   Musculoskeletal:  Negative for arthralgias.  Skin:  Negative for itching and rash.  Neurological:  Positive for extremity weakness and numbness. Negative for light-headedness.  Hematological:  Negative for adenopathy. Does not bruise/bleed easily.  Psychiatric/Behavioral:  Negative for confusion.   See interval history.    PHYSICAL EXAMINATION: ECOG PERFORMANCE STATUS: 1 - Symptomatic but completely ambulatory  Vitals:   09/02/23 0853  BP: 97/77  Pulse: (!) 104  Resp: 18  Temp: 98.4 F (36.9 C)  SpO2: 99%    Filed Weights   09/02/23 0853  Weight: 161 lb 9.6 oz (73.3 kg)      Physical Exam Constitutional:      General: He is not in acute distress.    Appearance: He is obese. He is not diaphoretic.  HENT:     Head: Normocephalic and atraumatic.  Eyes:     General: No scleral icterus. Cardiovascular:     Rate and Rhythm: Normal rate and regular rhythm.  Pulmonary:     Effort: Pulmonary effort is normal. No respiratory distress.     Breath sounds: No wheezing.  Abdominal:     General: There is no distension.     Palpations: Abdomen is soft.  Musculoskeletal:        General: Normal range of motion.     Cervical back: Normal range of motion and neck supple.  Skin:    General: Skin is warm and dry.     Findings: No erythema.  Neurological:     General: No focal deficit present.     Mental Status: He is alert and oriented to person,  place, and time. Mental status is at baseline.     Motor: No abnormal muscle tone.     Comments: No focal deficit   Psychiatric:        Mood and Affect: Mood and affect normal.      LABORATORY DATA:  I have reviewed the data as listed    Latest Ref Rng & Units 09/02/2023    8:26 AM 08/19/2023    8:52 AM 08/11/2023    8:35 AM  CBC  WBC 4.0 - 10.5 K/uL 3.4  9.5  6.5   Hemoglobin 13.0 - 17.0 g/dL 16.1  09.6  04.5   Hematocrit 39.0 - 52.0 % 38.4  40.3  39.3   Platelets 150 - 400 K/uL 245  303  234       Latest Ref Rng & Units 08/19/2023    8:52 AM 08/12/2023    6:03 AM 08/11/2023    8:35 AM  CMP  Glucose 70 - 99 mg/dL 96  81  84   BUN 8 - 23 mg/dL 14  11  13    Creatinine 0.61 - 1.24 mg/dL 4.09  8.11  9.14   Sodium 135 - 145 mmol/L 135  141  137   Potassium 3.5 - 5.1 mmol/L 3.0  3.8  2.9   Chloride 98 - 111 mmol/L 100  106  102   CO2 22 - 32 mmol/L 23  26  22    Calcium 8.9 - 10.3 mg/dL 9.2  9.0  9.0   Total Protein 6.5 - 8.1 g/dL 7.2   6.8   Total Bilirubin 0.0 - 1.2 mg/dL 0.5   0.9   Alkaline Phos 38 - 126 U/L 105   89   AST 15 -  41 U/L 30   17   ALT 0 - 44 U/L 22   22      RADIOGRAPHIC STUDIES: I have personally reviewed the radiological images as listed and agreed with the findings in the report. ECHOCARDIOGRAM COMPLETE Result Date: 08/26/2023    ECHOCARDIOGRAM REPORT   Patient Name:   LORY GALAN [facility] Date of Exam: 08/26/2023 Medical Rec #:  [id number]         Height:       69.0 in Accession #:    [id number]        Weight:       170.2 lb Date of Birth:  1955-10-09         BSA:          1.929 m Patient Age:    67 years          BP:           97/83 mmHg Patient Gender: M                 HR:           100 bpm. Exam Location:  [facility] Procedure: 2D Echo, Cardiac Doppler, Color Doppler and Intracardiac            Opacification Agent Indications:     Tachycardia R 00.0  History:         Patient has prior history of Echocardiogram examinations, most                  recent 08/12/2023. Risk  Factors:Hypertension.  Sonographer:     Cristela Blue Referring Phys:  [id number] Marlyn Corporal MADIREDDY Diagnosing Phys: Yvonne Kendall MD IMPRESSIONS  1. Left ventricular ejection fraction, by estimation, is 45 to 50%. Left ventricular ejection fraction by 3D volume is 46 %. The left ventricle has mildly decreased function. The left ventricle demonstrates regional wall motion abnormalities (see scoring diagram/findings for description). Left ventricular diastolic parameters are consistent with Grade I diastolic dysfunction (impaired relaxation). The average left ventricular global longitudinal strain is -11.0 %. The global longitudinal strain is abnormal.  2. Right ventricular systolic function is normal. The right ventricular size is normal.  3. The mitral valve is normal in structure. Trivial mitral valve regurgitation.  4. The aortic valve is tricuspid. Aortic valve regurgitation is not visualized. No aortic stenosis is present. Comparison(s): A prior study was performed on 08/12/2023. Compared to prior echo on 08/12/2023, LVEF is now mildly reduced with subtle apical septal and apical hypokinesis. FINDINGS  Left Ventricle: Left ventricular ejection fraction, by estimation, is 45 to 50%. Left ventricular ejection fraction by 3D volume is 46 %. The left ventricle has mildly decreased function. The left ventricle demonstrates regional wall motion abnormalities. Definity contrast agent was given IV to delineate the left ventricular endocardial borders. The average left ventricular global longitudinal strain is -11.0 %. The global longitudinal strain is abnormal. The left ventricular internal cavity size was normal in size. There is no left ventricular hypertrophy. Left ventricular diastolic parameters are consistent with Grade I diastolic dysfunction (impaired relaxation).  LV Wall Scoring: The apical septal segment and apex are hypokinetic. The entire anterior wall, entire lateral wall, anterior septum, entire inferior  wall, mid inferoseptal segment, and basal inferoseptal segment are normal. Right Ventricle: The right ventricular size is normal. No increase in right ventricular wall thickness. Right ventricular systolic function is normal. Left [facility]: Left atrial size was normal in size. Right [facility]: Right atrial size was normal in  size. Pericardium: The pericardium was not well visualized. Mitral Valve: The mitral valve is normal in structure. Trivial mitral valve regurgitation. Tricuspid Valve: The tricuspid valve is normal in structure. Tricuspid valve regurgitation is mild. Aortic Valve: The aortic valve is tricuspid. Aortic valve regurgitation is not visualized. No aortic stenosis is present. Aortic valve mean gradient measures 1.0 mmHg. Aortic valve peak gradient measures 2.2 mmHg. Aortic valve area, by VTI measures 5.17 cm. Pulmonic Valve: The pulmonic valve was not well visualized. Pulmonic valve regurgitation is not visualized. No evidence of pulmonic stenosis. Aorta: The aortic root is normal in size and structure. Pulmonary Artery: The pulmonary artery is not well seen. IAS/Shunts: The interatrial septum was not well visualized.  LEFT VENTRICLE PLAX 2D LVIDd:         3.30 cm         Diastology LVIDs:         2.50 cm         LV e' medial:    10.30 cm/s LV PW:         0.96 cm         LV E/e' medial:  5.3 LV IVS:        1.00 cm         LV e' lateral:   6.09 cm/s LVOT diam:     2.20 cm         LV E/e' lateral: 8.9 LV SV:         51 LV SV Index:   27              2D LVOT Area:     3.80 cm        Longitudinal                                Strain                                2D Strain GLS  -11.0 %                                Avg:                                 3D Volume EF                                LV 3D EF:    Left                                             ventricul                                             ar  ejection                                              fraction                                             by 3D                                             volume is                                             46 %.                                 3D Volume EF:                                3D EF:        46 % RIGHT VENTRICLE RV Basal diam:  2.60 cm  PULMONARY VEINS RV Mid diam:    2.30 cm  Diastolic Velocity: 6.85 cm/s LEFT ATRIUM             Index       RIGHT ATRIUM           Index LA diam:        2.60 cm 1.35 cm/m  RA Area:     10.20 cm LA Vol (A2C):   7.4 ml  3.84 ml/m  RA Volume:   21.50 ml  11.15 ml/m LA Vol (A4C):   8.3 ml  4.30 ml/m LA Biplane Vol: 8.3 ml  4.30 ml/m  AORTIC VALVE AV Area (Vmax):    3.42 cm AV Area (Vmean):   3.41 cm AV Area (VTI):     5.17 cm AV Vmax:           74.60 cm/s AV Vmean:          50.100 cm/s AV VTI:            0.099 m AV Peak Grad:      2.2 mmHg AV Mean Grad:      1.0 mmHg LVOT Vmax:         67.10 cm/s LVOT Vmean:        45.000 cm/s LVOT VTI:          0.135 m LVOT/AV VTI ratio: 1.36  AORTA Ao Root diam: 3.70 cm MITRAL VALVE               TRICUSPID VALVE MV Area (PHT): 3.01 cm    TR Peak grad:   16.2 mmHg MV Decel Time: 252 msec    TR Vmax:        201.00 cm/s MV E velocity: 54.50 cm/s MV A velocity: 94.30 cm/s  SHUNTS MV E/A ratio:  0.58        Systemic VTI:  0.14 m  Systemic Diam: 2.20 cm Yvonne Kendall MD Electronically signed by Yvonne Kendall MD Signature Date/Time: 08/26/2023/6:33:04 PM    Final    US Venous Img Lower Bilateral (DVT) Result Date: 08/12/2023 CLINICAL DATA:  Patent foramen ovale with transient ischemic attack. Prior history of DVT. Assess for residual DVT. EXAM: BILATERAL LOWER EXTREMITY VENOUS DOPPLER ULTRASOUND TECHNIQUE: Gray-scale sonography with graded compression, as well as color Doppler and duplex ultrasound were performed to evaluate the lower extremity deep venous systems from the level of the common femoral vein and including the common femoral, femoral, profunda  femoral, popliteal and calf veins including the posterior tibial, peroneal and gastrocnemius veins when visible. The superficial great saphenous vein was also interrogated. Spectral Doppler was utilized to evaluate flow at rest and with distal augmentation maneuvers in the common femoral, femoral and popliteal veins. COMPARISON:  None Available. FINDINGS: RIGHT LOWER EXTREMITY Common Femoral Vein: No evidence of thrombus. Normal compressibility, respiratory phasicity and response to augmentation. Saphenofemoral Junction: No evidence of thrombus. Normal compressibility and flow on color Doppler imaging. Profunda Femoral Vein: No evidence of thrombus. Normal compressibility and flow on color Doppler imaging. Femoral Vein: No evidence of thrombus. Normal compressibility, respiratory phasicity and response to augmentation. Popliteal Vein: No evidence of thrombus. Normal compressibility, respiratory phasicity and response to augmentation. Calf Veins: No evidence of thrombus. Normal compressibility and flow on color Doppler imaging. Superficial Great Saphenous Vein: No evidence of thrombus. Normal compressibility. Venous Reflux:  None. Other Findings:  None. LEFT LOWER EXTREMITY Common Femoral Vein: No evidence of thrombus. Normal compressibility, respiratory phasicity and response to augmentation. Saphenofemoral Junction: No evidence of thrombus. Normal compressibility and flow on color Doppler imaging. Profunda Femoral Vein: No evidence of thrombus. Normal compressibility and flow on color Doppler imaging. Femoral Vein: No evidence of thrombus. Normal compressibility, respiratory phasicity and response to augmentation. Popliteal Vein: No evidence of thrombus. Normal compressibility, respiratory phasicity and response to augmentation. Calf Veins: No evidence of thrombus. Normal compressibility and flow on color Doppler imaging. Superficial Great Saphenous Vein: No evidence of thrombus. Normal compressibility. Venous  Reflux:  None. Other Findings:  None. IMPRESSION: No evidence of deep venous thrombosis in either lower extremity. Electronically Signed   By: Malachy Moan M.D.   On: 08/12/2023 16:07   ECHOCARDIOGRAM COMPLETE Result Date: 08/12/2023    ECHOCARDIOGRAM REPORT   Patient Name:   DONAVIN AUDINO [facility] Date of Exam: 08/12/2023 Medical Rec #:  [id number]         Height:       69.0 in Accession #:    [id number]        Weight:       175.0 lb Date of Birth:  1955-08-06         BSA:          1.952 m Patient Age:    67 years          BP:           133/91 mmHg Patient Gender: M                 HR:           76 bpm. Exam Location:  [facility] Procedure: 2D Echo, Cardiac Doppler, Color Doppler and Saline Contrast Bubble            Study Indications:     TIA G45.9  History:         Patient has no prior history of Echocardiogram examinations.  Risk Factors:Hypertension. DVT.  Sonographer:     Cristela Blue Referring Phys:  [id number] [facility] General Diagnosing Phys: Yvonne Kendall MD IMPRESSIONS  1. Left ventricular ejection fraction, by estimation, is 55 to 60%. The left ventricle has normal function. The left ventricle has no regional wall motion abnormalities. There is mild left ventricular hypertrophy. Left ventricular diastolic parameters are consistent with Grade I diastolic dysfunction (impaired relaxation).  2. Right ventricular systolic function is normal. The right ventricular size is normal. Tricuspid regurgitation signal is inadequate for assessing PA pressure.  3. The mitral valve is normal in structure. No evidence of mitral valve regurgitation. No evidence of mitral stenosis.  4. The aortic valve is tricuspid. Aortic valve regurgitation is not visualized. No aortic stenosis is present.  5. Agitated saline contrast bubble study was positive with shunting observed within 3-6 cardiac cycles suggestive of interatrial shunt. FINDINGS  Left Ventricle: Left ventricular ejection fraction, by estimation, is 55 to 60%. The  left ventricle has normal function. The left ventricle has no regional wall motion abnormalities. The left ventricular internal cavity size was normal in size. There is  mild left ventricular hypertrophy. Left ventricular diastolic parameters are consistent with Grade I diastolic dysfunction (impaired relaxation). Right Ventricle: The right ventricular size is normal. No increase in right ventricular wall thickness. Right ventricular systolic function is normal. Tricuspid regurgitation signal is inadequate for assessing PA pressure. Left Atrium: Left atrial size was normal in size. Right Atrium: Right atrial size was normal in size. Pericardium: The pericardium was not well visualized. Mitral Valve: The mitral valve is normal in structure. No evidence of mitral valve regurgitation. No evidence of mitral valve stenosis. MV peak gradient, 3.9 mmHg. The mean mitral valve gradient is 1.0 mmHg. Tricuspid Valve: The tricuspid valve is normal in structure. Tricuspid valve regurgitation is trivial. Aortic Valve: The aortic valve is tricuspid. Aortic valve regurgitation is not visualized. No aortic stenosis is present. Aortic valve mean gradient measures 2.0 mmHg. Aortic valve peak gradient measures 2.9 mmHg. Aortic valve area, by VTI measures 4.62 cm. Pulmonic Valve: The pulmonic valve was not well visualized. Pulmonic valve regurgitation is trivial. No evidence of pulmonic stenosis. Aorta: The aortic root is normal in size and structure. Pulmonary Artery: The pulmonary artery is not well seen. Venous: The inferior vena cava was not well visualized. IAS/Shunts: The interatrial septum was not well visualized. Agitated saline contrast was given intravenously to evaluate for intracardiac shunting. Agitated saline contrast bubble study was positive with shunting observed within 3-6 cardiac cycles suggestive of interatrial shunt.  LEFT VENTRICLE PLAX 2D LVIDd:         3.70 cm   Diastology LVIDs:         2.60 cm   LV e' medial:     6.20 cm/s LV PW:         1.30 cm   LV E/e' medial:  11.0 LV IVS:        1.20 cm   LV e' lateral:   11.70 cm/s LVOT diam:     2.30 cm   LV E/e' lateral: 5.8 LV SV:         71 LV SV Index:   36 LVOT Area:     4.15 cm  RIGHT VENTRICLE RV Basal diam:  2.60 cm RV Mid diam:    2.60 cm LEFT ATRIUM             Index       RIGHT ATRIUM  Index LA diam:        1.80 cm 0.92 cm/m  RA Area:     8.90 cm LA Vol (A2C):   12.6 ml 6.45 ml/m  RA Volume:   14.30 ml 7.33 ml/m LA Vol (A4C):   10.2 ml 5.23 ml/m LA Biplane Vol: 12.2 ml 6.25 ml/m  AORTIC VALVE AV Area (Vmax):    3.56 cm AV Area (Vmean):   3.55 cm AV Area (VTI):     4.62 cm AV Vmax:           85.65 cm/s AV Vmean:          59.750 cm/s AV VTI:            0.153 m AV Peak Grad:      2.9 mmHg AV Mean Grad:      2.0 mmHg LVOT Vmax:         73.40 cm/s LVOT Vmean:        51.100 cm/s LVOT VTI:          0.170 m LVOT/AV VTI ratio: 1.11  AORTA Ao Root diam: 3.40 cm MITRAL VALVE MV Area (PHT): 3.23 cm    SHUNTS MV Area VTI:   3.51 cm    Systemic VTI:  0.17 m MV Peak grad:  3.9 mmHg    Systemic Diam: 2.30 cm MV Mean grad:  1.0 mmHg MV Vmax:       0.98 m/s MV Vmean:      52.6 cm/s MV Decel Time: 235 msec MV E velocity: 68.10 cm/s MV A velocity: 95.40 cm/s MV E/A ratio:  0.71 Cristal Deer End MD Electronically signed by Yvonne Kendall MD Signature Date/Time: 08/12/2023/10:12:56 AM    Final    MR BRAIN WO CONTRAST Result Date: 08/11/2023 CLINICAL DATA:  TIA. Episode of slurred speech, left facial droop and left upper extremity weakness lasting approximately 1 minute. EXAM: MRI HEAD WITHOUT CONTRAST TECHNIQUE: Multiplanar, multiecho pulse sequences of the brain and surrounding structures were obtained without intravenous contrast. COMPARISON:  CT head without contrast 08/11/2023. MR head without and with contrast 06/04/2023. FINDINGS: Brain: No acute infarct, hemorrhage, or mass lesion is present. Scattered subcortical T2 hyperintensities are stable and likely within  normal limits for age. The ventricles are of normal size. No significant extraaxial fluid collection is present. Deep brain nuclei are within normal limits. The brainstem and cerebellum are within normal limits. Midline structures are within normal limits. The internal auditory canals are within normal limits. Vascular: Flow is present in the major intracranial arteries. The globes and orbits are within normal limits. Skull and upper cervical spine: Degenerative changes are present at the C3-4 disc level. At least partial effacement of ventral CSF is present. The craniocervical junction is normal. Marrow signal is normal. Sinuses/Orbits: The paranasal sinuses and mastoid air cells are clear. The globes and orbits are within normal limits. IMPRESSION: 1. Normal MRI appearance of the brain for age. No acute or focal lesion to explain the patient's symptoms. 2. Degenerative changes at the C3-4 disc level with at least partial effacement of ventral CSF. Electronically Signed   By: Marin Roberts M.D.   On: 08/11/2023 18:39   CT ANGIO HEAD NECK W WO CM Result Date: 08/11/2023 CLINICAL DATA:  Episode of slurred speech, left-sided facial droop and left upper extremity weakness lasting 1 minute at 8 o'clock a.m. today at the cancer center. Esophageal cancer. EXAM: CT ANGIOGRAPHY HEAD AND NECK WITH AND WITHOUT CONTRAST TECHNIQUE: Multidetector CT imaging of the  head and neck was performed using the standard protocol during bolus administration of intravenous contrast. Multiplanar CT image reconstructions and MIPs were obtained to evaluate the vascular anatomy. Carotid stenosis measurements (when applicable) are obtained utilizing NASCET criteria, using the distal internal carotid diameter as the denominator. RADIATION DOSE REDUCTION: This exam was performed according to the departmental dose-optimization program which includes automated exposure control, adjustment of the mA and/or kV according to patient size  and/or use of iterative reconstruction technique. CONTRAST:  75mL OMNIPAQUE IOHEXOL 350 MG/ML SOLN COMPARISON:  CT head without contrast 08/11/2023 FINDINGS: CTA NECK FINDINGS Aortic arch: Atherosclerotic calcifications are present at the distal aortic arch. No stenosis or aneurysm is present. No significant changes are present at the great vessel origins. A 3 vessel arch configuration is present. No dissection is present. Right carotid system: The right common carotid artery is mildly tortuous without focal stenosis. Bifurcation is within normal limits. Moderate tortuosity is present cervical right ICA without significant stenosis. Left carotid system: The left common carotid artery is within normal limits. Atherosclerotic calcifications are present at bifurcation without significant stenosis relative to the more distal vessel. The cervical left ICA is normal. Vertebral arteries: The right vertebral artery is the dominant vessel. Both vertebral arteries originate from the subclavian arteries without significant stenosis. No significant stenosis is present in either vertebral artery in the neck. Skeleton: Multilevel degenerative changes are present cervical spine. No focal osseous lesions are present. Uncovertebral spurring contributes to foraminal narrowing bilaterally at C4-5, C5-6 and C6-7. Other neck: Soft tissues the neck are otherwise unremarkable. Salivary glands are within normal limits. Thyroid is normal. No significant adenopathy is present. No focal mucosal or submucosal lesions are present. A right IJ Port-A-Cath is in place. Upper chest: The lung apices are clear. The thoracic inlet is within normal limits. No significant pleural effusion or pneumothorax is present. Review of the MIP images confirms the above findings CTA HEAD FINDINGS Anterior circulation: Atherosclerotic calcifications are present within the cavernous internal carotid arteries. No significant stenosis is present. The ICA termini are  normal bilaterally. The A1 and M1 segments are normal. The MCA bifurcations are normal bilaterally. ACA and MCA branch vessels are within normal limits. Posterior circulation: PICA origins are visualized and normal. Vertebrobasilar junction basilar artery normal. The superior cerebellar arteries are patent. Both posterior cerebral arteries originate from basilar tip. A left posterior communicating artery contributes. The is PCA branch vessels are within normal limits. Venous sinuses: The dural sinuses are patent. The straight sinus and deep cerebral veins are intact. Cortical veins are within normal limits. No significant vascular malformation is evident. Anatomic variants: None Review of the MIP images confirms the above findings IMPRESSION: 1. No emergent large vessel occlusion. 2. Atherosclerotic changes at the left carotid bifurcation and cavernous internal carotid arteries without significant stenosis relative to the more distal vessels. 3. Multilevel degenerative changes of the cervical spine. 4.  Aortic Atherosclerosis (ICD10-I70.0). Electronically Signed   By: Marin Roberts M.D.   On: 08/11/2023 18:37   CT HEAD WO CONTRAST Result Date: 08/11/2023 CLINICAL DATA:  Provided history: Neuro deficit, acute, stroke suspected. Slurred speech, left-sided facial droop, left arm weakness. Patient reports deficits have now resolved. EXAM: CT HEAD WITHOUT CONTRAST TECHNIQUE: Contiguous axial images were obtained from the base of the skull through the vertex without intravenous contrast. RADIATION DOSE REDUCTION: This exam was performed according to the departmental dose-optimization program which includes automated exposure control, adjustment of the mA and/or kV according to patient size  and/or use of iterative reconstruction technique. COMPARISON:  Brain MRI 06/04/2023. FINDINGS: Brain: Mild generalized parenchymal atrophy. There is no acute intracranial hemorrhage. No demarcated cortical infarct. No  extra-axial fluid collection. No evidence of an intracranial mass. No midline shift. Vascular: No hyperdense vessel.  Atherosclerotic calcifications. Skull: No calvarial fracture or aggressive osseous lesion. Sinuses/Orbits: No mass or acute finding within the imaged orbits. No significant paranasal sinus disease at the imaged levels. Other: Trace fluid within the ethmoid air cells. IMPRESSION: 1. No evidence of an acute intracranial abnormality. 2. Mild generalized parenchymal atrophy. Electronically Signed   By: Jackey Loge D.O.   On: 08/11/2023 09:04

## 2023-09-03 ENCOUNTER — Other Ambulatory Visit: Payer: Self-pay

## 2023-09-03 DIAGNOSIS — K5790 Diverticulosis of intestine, part unspecified, without perforation or abscess without bleeding: Secondary | ICD-10-CM | POA: Diagnosis not present

## 2023-09-03 DIAGNOSIS — I10 Essential (primary) hypertension: Secondary | ICD-10-CM | POA: Diagnosis not present

## 2023-09-03 DIAGNOSIS — R531 Weakness: Secondary | ICD-10-CM | POA: Diagnosis not present

## 2023-09-03 DIAGNOSIS — E669 Obesity, unspecified: Secondary | ICD-10-CM | POA: Diagnosis not present

## 2023-09-03 DIAGNOSIS — E039 Hypothyroidism, unspecified: Secondary | ICD-10-CM | POA: Diagnosis not present

## 2023-09-03 DIAGNOSIS — G4730 Sleep apnea, unspecified: Secondary | ICD-10-CM | POA: Diagnosis not present

## 2023-09-03 DIAGNOSIS — E876 Hypokalemia: Secondary | ICD-10-CM | POA: Diagnosis not present

## 2023-09-03 DIAGNOSIS — E7889 Other lipoprotein metabolism disorders: Secondary | ICD-10-CM | POA: Diagnosis not present

## 2023-09-03 DIAGNOSIS — G459 Transient cerebral ischemic attack, unspecified: Secondary | ICD-10-CM | POA: Diagnosis not present

## 2023-09-03 DIAGNOSIS — C159 Malignant neoplasm of esophagus, unspecified: Secondary | ICD-10-CM | POA: Diagnosis not present

## 2023-09-03 LAB — T4: T4, Total: 9.5 ug/dL (ref 4.5–12.0)

## 2023-09-03 LAB — CEA: CEA: 234 ng/mL — ABNORMAL HIGH (ref 0.0–4.7)

## 2023-09-04 ENCOUNTER — Ambulatory Visit: Payer: No Typology Code available for payment source | Admitting: Physical Therapy

## 2023-09-04 ENCOUNTER — Inpatient Hospital Stay: Payer: No Typology Code available for payment source

## 2023-09-04 DIAGNOSIS — R29898 Other symptoms and signs involving the musculoskeletal system: Secondary | ICD-10-CM | POA: Diagnosis not present

## 2023-09-04 DIAGNOSIS — G459 Transient cerebral ischemic attack, unspecified: Secondary | ICD-10-CM | POA: Diagnosis not present

## 2023-09-05 ENCOUNTER — Other Ambulatory Visit: Payer: Self-pay | Admitting: *Deleted

## 2023-09-05 ENCOUNTER — Ambulatory Visit: Payer: No Typology Code available for payment source

## 2023-09-05 ENCOUNTER — Inpatient Hospital Stay: Payer: No Typology Code available for payment source

## 2023-09-05 ENCOUNTER — Telehealth: Payer: Self-pay | Admitting: *Deleted

## 2023-09-05 VITALS — BP 87/59 | HR 104 | Temp 97.0°F | Resp 16

## 2023-09-05 DIAGNOSIS — Z95828 Presence of other vascular implants and grafts: Secondary | ICD-10-CM

## 2023-09-05 DIAGNOSIS — C159 Malignant neoplasm of esophagus, unspecified: Secondary | ICD-10-CM

## 2023-09-05 DIAGNOSIS — I951 Orthostatic hypotension: Secondary | ICD-10-CM

## 2023-09-05 DIAGNOSIS — C155 Malignant neoplasm of lower third of esophagus: Secondary | ICD-10-CM | POA: Diagnosis not present

## 2023-09-05 LAB — COMPREHENSIVE METABOLIC PANEL
ALT: 30 U/L (ref 0–44)
AST: 32 U/L (ref 15–41)
Albumin: 3.1 g/dL — ABNORMAL LOW (ref 3.5–5.0)
Alkaline Phosphatase: 133 U/L — ABNORMAL HIGH (ref 38–126)
Anion gap: 10 (ref 5–15)
BUN: 15 mg/dL (ref 8–23)
CO2: 23 mmol/L (ref 22–32)
Calcium: 8.8 mg/dL — ABNORMAL LOW (ref 8.9–10.3)
Chloride: 96 mmol/L — ABNORMAL LOW (ref 98–111)
Creatinine, Ser: 0.8 mg/dL (ref 0.61–1.24)
GFR, Estimated: >60 mL/min (ref >60)
Glucose, Bld: 116 mg/dL — ABNORMAL HIGH (ref 70–99)
Potassium: 3.5 mmol/L (ref 3.5–5.1)
Sodium: 129 mmol/L — ABNORMAL LOW (ref 135–145)
Total Bilirubin: 0.5 mg/dL (ref 0.0–1.2)
Total Protein: 6.7 g/dL (ref 6.5–8.1)

## 2023-09-05 LAB — CBC WITH DIFFERENTIAL/PLATELET
Abs Immature Granulocytes: 0.09 10*3/uL — ABNORMAL HIGH (ref 0.00–0.07)
Basophils Absolute: 0 10*3/uL (ref 0.0–0.1)
Basophils Relative: 1 %
Eosinophils Absolute: 0 10*3/uL (ref 0.0–0.5)
Eosinophils Relative: 1 %
HCT: 35.7 % — ABNORMAL LOW (ref 39.0–52.0)
Hemoglobin: 11.5 g/dL — ABNORMAL LOW (ref 13.0–17.0)
Immature Granulocytes: 3 %
Lymphocytes Relative: 16 %
Lymphs Abs: 0.5 10*3/uL — ABNORMAL LOW (ref 0.7–4.0)
MCH: 27.4 pg (ref 26.0–34.0)
MCHC: 32.2 g/dL (ref 30.0–36.0)
MCV: 85 fL (ref 80.0–100.0)
Monocytes Absolute: 0.7 10*3/uL (ref 0.1–1.0)
Monocytes Relative: 20 %
Neutro Abs: 2 10*3/uL (ref 1.7–7.7)
Neutrophils Relative %: 59 %
Platelets: 342 10*3/uL (ref 150–400)
RBC: 4.2 MIL/uL — ABNORMAL LOW (ref 4.22–5.81)
RDW: 16.5 % — ABNORMAL HIGH (ref 11.5–15.5)
WBC: 3.3 10*3/uL — ABNORMAL LOW (ref 4.0–10.5)
nRBC: 0 % (ref 0.0–0.2)

## 2023-09-05 MED ORDER — HEPARIN SOD (PORK) LOCK FLUSH 100 UNIT/ML IV SOLN
500.0000 [IU] | Freq: Once | INTRAVENOUS | Status: AC
  Administered 2023-09-05: 500 [IU] via INTRAVENOUS
  Filled 2023-09-05: qty 5

## 2023-09-05 MED ORDER — SODIUM CHLORIDE 0.9 % IV SOLN
INTRAVENOUS | Status: DC
  Filled 2023-09-05 (×2): qty 250

## 2023-09-05 NOTE — Telephone Encounter (Signed)
 I called the pt and he will be here to register 2:15 and getlabs and then get IVF.pt has a driver to get here

## 2023-09-05 NOTE — Telephone Encounter (Signed)
 Jonathan [name] from [facility] was checking on patient and the blood pressure was low and they called the PCP Dr. Valora and Dr. Valora suggested that if his blood pressure is still low you could talk to the cancer center to see if he can get some fluids. I called the Home health staff her name is Ronal and she told me yesterday that he was very weak he had shortness of breath his oxygen level was 97% and his pulse was 86 manually and then she said 15 but when she put the pulse ox on it the number for the pulse was 100.  And they wondered as well as with Dr. Valora if somebody could have him come over and get some IV fluids

## 2023-09-07 DIAGNOSIS — G4733 Obstructive sleep apnea (adult) (pediatric): Secondary | ICD-10-CM | POA: Diagnosis not present

## 2023-09-08 DIAGNOSIS — M9902 Segmental and somatic dysfunction of thoracic region: Secondary | ICD-10-CM | POA: Diagnosis not present

## 2023-09-08 DIAGNOSIS — M9901 Segmental and somatic dysfunction of cervical region: Secondary | ICD-10-CM | POA: Diagnosis not present

## 2023-09-08 DIAGNOSIS — M62830 Muscle spasm of back: Secondary | ICD-10-CM | POA: Diagnosis not present

## 2023-09-08 DIAGNOSIS — M9903 Segmental and somatic dysfunction of lumbar region: Secondary | ICD-10-CM | POA: Diagnosis not present

## 2023-09-09 ENCOUNTER — Other Ambulatory Visit: Payer: Self-pay

## 2023-09-09 ENCOUNTER — Ambulatory Visit: Payer: No Typology Code available for payment source | Admitting: Physical Therapy

## 2023-09-09 ENCOUNTER — Ambulatory Visit
Admission: RE | Admit: 2023-09-09 | Discharge: 2023-09-09 | Disposition: A | Discharge status: Home / Self Care | Payer: No Typology Code available for payment source | Source: Ambulatory Visit | Attending: Oncology | Admitting: Oncology

## 2023-09-09 DIAGNOSIS — I700 Atherosclerosis of aorta: Secondary | ICD-10-CM | POA: Diagnosis not present

## 2023-09-09 DIAGNOSIS — C787 Secondary malignant neoplasm of liver and intrahepatic bile duct: Secondary | ICD-10-CM | POA: Diagnosis not present

## 2023-09-09 DIAGNOSIS — C159 Malignant neoplasm of esophagus, unspecified: Secondary | ICD-10-CM | POA: Diagnosis not present

## 2023-09-09 DIAGNOSIS — C7951 Secondary malignant neoplasm of bone: Secondary | ICD-10-CM | POA: Diagnosis not present

## 2023-09-09 DIAGNOSIS — Z515 Encounter for palliative care: Secondary | ICD-10-CM | POA: Diagnosis not present

## 2023-09-09 DIAGNOSIS — K8020 Calculus of gallbladder without cholecystitis without obstruction: Secondary | ICD-10-CM | POA: Diagnosis not present

## 2023-09-09 DIAGNOSIS — Z008 Encounter for other general examination: Secondary | ICD-10-CM | POA: Diagnosis not present

## 2023-09-09 DIAGNOSIS — R634 Abnormal weight loss: Secondary | ICD-10-CM | POA: Diagnosis not present

## 2023-09-09 DIAGNOSIS — R630 Anorexia: Secondary | ICD-10-CM | POA: Diagnosis not present

## 2023-09-09 MED ORDER — IOHEXOL 300 MG/ML  SOLN
100.0000 mL | Freq: Once | INTRAMUSCULAR | Status: AC | PRN
  Administered 2023-09-09: 100 mL via INTRAVENOUS

## 2023-09-11 ENCOUNTER — Ambulatory Visit: Payer: No Typology Code available for payment source | Admitting: Physical Therapy

## 2023-09-12 ENCOUNTER — Inpatient Hospital Stay (HOSPITAL_BASED_OUTPATIENT_CLINIC_OR_DEPARTMENT_OTHER): Payer: No Typology Code available for payment source | Admitting: Internal Medicine

## 2023-09-12 ENCOUNTER — Encounter: Payer: Self-pay | Admitting: Internal Medicine

## 2023-09-12 VITALS — BP 96/79 | HR 113 | Temp 96.6°F | Resp 19 | Wt 161.4 lb

## 2023-09-12 DIAGNOSIS — G459 Transient cerebral ischemic attack, unspecified: Secondary | ICD-10-CM

## 2023-09-12 DIAGNOSIS — C155 Malignant neoplasm of lower third of esophagus: Secondary | ICD-10-CM | POA: Diagnosis not present

## 2023-09-12 DIAGNOSIS — G620 Drug-induced polyneuropathy: Secondary | ICD-10-CM | POA: Diagnosis not present

## 2023-09-12 DIAGNOSIS — T451X5A Adverse effect of antineoplastic and immunosuppressive drugs, initial encounter: Secondary | ICD-10-CM | POA: Diagnosis not present

## 2023-09-12 NOTE — Progress Notes (Signed)
[facility] [address]  [place], [place] [address] [phone]   New Patient Evaluation  Date of Service: 09/12/23 Patient Name: Jonathan [name] Patient MRN: [id number] Patient DOB: 15-Jan-1956 Provider: Henreitta Leber, MD  Identifying Statement:  Jonathan [name] is a 68 y.o. male with TIA (transient ischemic attack)  Chemotherapy-induced neuropathy [facility]) who presents for initial consultation and evaluation regarding cancer associated neurologic deficits.    Referring Provider: [name], MD [address] [place],  [place] [address]  Primary Cancer:  Oncologic History: Oncology History  Adenocarcinoma of esophagus (HCC)  08/28/2022 Initial Diagnosis   Adenocarcinoma of esophagus   -Patient has noticed worsening of "food stuck/fullness" sensation since November 2023.  Patient had a barium swallow study which commented on marked mucosal irregularity in the distal esophagus with Broaddus base mural filling defect highly suspicious for malignancy.  Patient establish care with gastroenterology. -08/23/2022, EGD showed gastritis and partially obstructing malignant esophageal tumor in the lower third of the esophagus. Esophagus mass biopsy showed adenocarcinoma.  PD-L1 TPS 65%, HER2 negative.  [facility] NGS showed KRASG12V, CDKN2A, ARID1A, TP53, TMB 5.3, MSI stable.  Stomach biopsy showed gastric mucosa with no specific histology abnormality.  No significant intestinal metaplastic, dysplastic, granular atrophy or increased inflammation.     08/28/2022 Imaging   CT chest abdomen pelvis with contrast showed 1. Distal esophageal primary with gastrohepatic ligament nodal metastasis. 2. 2 right-sided pulmonary nodules, the largest of which measures 5 mm and is new since 2015. Pulmonary metastasis not be excluded. 3. Anterior right lower lobe volume loss and minimal soft tissue density, favoring atelectasis or scar.  Recommend attention on follow-up. 4. Hepatic steatosis 5. Cholelithiasis 6. Left nephrolithiasis 7. Coronary artery atherosclerosis. Aortic Atherosclerosis   08/28/2022 Cancer Staging   Staging form: Esophagus - Adenocarcinoma, AJCC 8th Edition - Clinical stage from 08/28/2022: Stage IVB (cT3, cN1, cM1) - Signed by Rickard Patience, MD on 10/04/2022 Stage prefix: Initial diagnosis   09/06/2022 Imaging   PET scan showed 1. Esophageal primary with gastrohepatic ligament nodal metastasis,as on CT. 2. Focus of hypermetabolism which is favored to registered to the posterior hepatic dome, in the region of subtle heterogeneity on prior diagnostic CT. Suboptimally evaluated secondary to underlying steatosis. Recommend further evaluation with pre and post contrast abdominal MRI to confirm probable metastasis. 3. Incidental findings, including: Left nephrolithiasis.Cholelithiasis. Coronary artery atherosclerosis. Aortic Atherosclerosis    09/23/2022 Imaging   MRI abdomen with and without contrast showed 1. Mildly T2 hyperintense segment VII hepatic lesion measuring 2.7 cm with imaging characteristics compatible with metastatic disease. 2. Tiny focus of delayed enhancement in the inferior right lobe of the liver segment VI measuring 8 mm with ill-defined increased T2 signal and subtle corresponding reduced diffusivity, also suspicious for metastatic disease. 3. Partially visualized distal esophageal wall thickening compatible with the patient's known primary neoplasm. 4. Similar size of the 11 mm gastrohepatic ligament lymph node mildly metabolic on prior PET-CT and compatible with local nodal disease involvement. 5. 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 Recommend follow up pre and post-contrast MRI/MRCP in 1 year. 6. Diffuse hepatic steatosis.   10/10/2022 Procedure   LIVER MASS; CT-GUIDED BIOPSY:  - MODERATE TO POORLY  DIFFERENTIATED ADENOCARCINOMA MORPHOLOGICALLY  CONSISTENT WITH METASTASIS FROM PATIENT'S KNOWN ESOPHAGEAL  ADENOCARCINOMA.    10/17/2022 Procedure   Medi port placed by Dr. Wyn Quaker  10/21/2022 - 01/15/2023 Chemotherapy   Patient is on Treatment Plan : ESOPHAGEAL ADENOCARCINOMA FOLFOX q14d x 6 cycles     01/15/2023 Imaging   PET showed  1. Interval progression in metastatic esophageal carcinoma as evidenced by hypermetabolic lymph nodes in the neck, chest, abdomen and pelvis, an enlarging right hepatic lobe metastasis, new bilateral adrenal metastases and new osseous metastases. 2. Cholelithiasis. 3. Left renal stones. 4. Aortic atherosclerosis (ICD10-I70.0). Coronary artery calcification.    01/27/2023 - 03/14/2023 Chemotherapy   Patient is on Treatment Plan : GASTROESOPHAGEAL FOLFOX + Nivolumab q14d     03/20/2023 Imaging   CT chest abdomen pelvis w contrast showed 1. Interval progression of metastatic disease with multiple new and enlarged hypodense lesions throughout the liver. 2. Significant enlargement of bilateral adrenal metastases. 3. Subtle sclerosis of an osseous metastasis of the right femoral neck. Other previously FDG avid osseous metastases of the left femoral neck and right sixth rib not appreciated by CT. 4. No persistently enlarged lymph nodes. 5. Interval development of bandlike consolidation and fibrosis of the paramedian right lung, particularly of the right lower lobe, as well as additional scattered irregular opacities throughout the right lung. 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 6. Similar circumferential wall thickening throughout the mid to lower esophagus, consistent with known primary esophageal adenocarcinoma. 7. Nonobstructive bilateral nephrolithiasis. Aortic Atherosclerosis   03/27/2023 - 06/19/2023 Chemotherapy   Patient is on Treatment Plan : GASTROESOPHAGEAL Ramucirumab D1, 15 + Paclitaxel D1,8,15 q28d      05/02/2023 - 05/08/2023 Hospital Admission   Admission due to coffee-ground emesis. EGD 10/7 showed bleeding from esophageal cancer, esophagitis due to chemo and radiation. Applied Hemospray. Eliquis was stopped. Acute blood loss anemia, received IV venofer treatments x 3 doses    05/28/2023 Imaging   1. Progressive hypermetabolism in the distal esophagus consistent with progressive disease. 2. Significant progression of metastatic hepatic disease (new and enlarging lesions). 3. Enlarging and progressively hypermetabolic right adrenal gland mass. 4. New hypermetabolic bone lesions in the T12 and L2 vertebral bodies, left sacrum and right sixth posterior rib consistent with metastatic disease. 5. Possible hypermetabolic brain lesions. MRI suggested for further evaluation. 6. Chronic medial right lower lobe atelectasis likely radiation change. Moderate hypermetabolism but no obvious tumor. 7. Stable gallstones, renal calculi and vascular calcifications.   06/19/2023 Imaging   CT chest angiogram w contrast, CT abdomen pelvis w contrast  1. Negative for pulmonary embolus. 2. Persistent lower esophageal wall thickening with progressive hepatic and right adrenal metastatic disease, compatible with stage IV esophageal carcinoma, as on PET 05/21/2023. 3. 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  4. 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 5. 4.0 cm ascending aortic aneurysm. Recommend annual imaging follow up by CTA or MRA. This recommendation follows 2010 ACCF/AHA/AATS/ACR/ASA/SCA/SCAI/SIR/STS/SVM Guidelines for the Diagnosis and Management of Patients with Thoracic Aortic Disease.Circulation. 2010; 121: R604-V409. Aortic aneurysm NOS (ICD10-I71.9). 6. Cholelithiasis. 7. Small left renal stone. 8. Aortic  atherosclerosis (ICD10-I70.0). Left and descending coronary artery calcification.   06/30/2023 -  Chemotherapy   Patient is on Treatment Plan : COLORECTAL FOLFIRI q14d       History of Present Illness: The patient's records from the referring physician were obtained and reviewed and the patient interviewed to confirm this HPI.  ROWIN BAYRON presents to review recent hospitalization for TIA/stroke.  He describes episode  on 08/11/23 of sudden onset left arm weakness, slurred speech, left facial droop.  This resolved back to baseline within 1-2 minutes.  Since that time no recurrence of this or any other neurologic deficits.  Continues on chemotherapy for esophageal cancer with Dr. Cathie Hoops.  Medications: Current Outpatient Medications on File Prior to Visit  Medication Sig Dispense Refill   acetaminophen (TYLENOL) 650 MG CR tablet Take 1,300 mg by mouth every 8 (eight) hours as needed for pain.     aspirin EC 81 MG tablet Take 1 tablet (81 mg total) by mouth daily. Swallow whole. 30 tablet 12   azelastine (ASTELIN) 0.1 % nasal spray Place 1 spray into both nostrils 2 (two) times daily. Use in each nostril as directed 30 mL 1   chlorhexidine (PERIDEX) 0.12 % solution USE AS DIRECTED TAKE  15  ML  IN  THE  MOUTH  OR  THROAT  TWICE  DAILY 473 mL 0   dronabinol (MARINOL) 5 MG capsule Take 1 capsule (5 mg total) by mouth 2 (two) times daily before lunch and supper. 60 capsule 0   gentamicin ointment (GARAMYCIN) 0.1 % Apply 1 Application topically 2 (two) times daily as needed.     ipratropium (ATROVENT) 0.03 % nasal spray Place 2 sprays into both nostrils 2 (two) times daily.     ketoconazole (NIZORAL) 2 % shampoo Apply 1 Application topically 2 (two) times a week.     levothyroxine (SYNTHROID) 125 MCG tablet Take 125 mcg by mouth every morning.     loperamide (IMODIUM) 2 MG capsule Take 2 tabs by mouth with first loose stool, then 1 tab with each additional loose stool as needed. Do not exceed 8 tabs in  a 24-hour period 90 capsule 0   magic mouthwash (multi-ingredient) oral suspension Swish and spit 5-10 mLs by mouth 4 (four) times daily as needed. 480 mL 1   metoprolol tartrate (LOPRESSOR) 25 MG tablet Take 1 tablet (25 mg total) by mouth 2 (two) times daily. 180 tablet 3   OLANZapine (ZYPREXA) 10 MG tablet Take 0.5-1 tablets (5-10 mg total) by mouth at bedtime as needed (nausea). 30 tablet 3   pantoprazole (PROTONIX) 40 MG tablet Take 1 tablet (40 mg total) by mouth 2 (two) times daily. 60 tablet 3   potassium chloride SA (KLOR-CON M) 20 MEQ tablet Take 2 tablets (40 mEq total) by mouth 2 (two) times daily. 60 tablet 1   predniSONE (DELTASONE) 10 MG tablet Take 2 tablets (20 mg total) by mouth daily with breakfast. 60 tablet 0   prochlorperazine (COMPAZINE) 10 MG tablet Take 10 mg by mouth every 6 (six) hours as needed.     No current facility-administered medications on file prior to visit.    Allergies: No Known Allergies Past Medical History:  Past Medical History:  Diagnosis Date   Arthritis    Complication of anesthesia    OCCURRED ONCE YEARS AGO 1981   Deep venous thrombosis (HCC) 10/28/2022   Esophageal mass    Essential hypertension 05/01/2023   Hypertension    Hypothyroidism    PONV (postoperative nausea and vomiting)    Past Surgical History:  Past Surgical History:  Procedure Laterality Date   COLONOSCOPY     ESOPHAGOGASTRODUODENOSCOPY N/A 08/23/2022   Procedure: ESOPHAGOGASTRODUODENOSCOPY (EGD);  Surgeon: Jaynie Collins, DO;  Location: [facility] ENDOSCOPY;  Service: Gastroenterology;  Laterality: N/A;   ESOPHAGOGASTRODUODENOSCOPY (EGD) WITH PROPOFOL N/A 05/05/2023   Procedure: ESOPHAGOGASTRODUODENOSCOPY (EGD) WITH PROPOFOL;  Surgeon: Jaynie Collins,  DO;  Location: [facility] ENDOSCOPY;  Service: Gastroenterology;  Laterality: N/A;   EUS N/A 09/12/2022   Procedure: FULL UPPER ENDOSCOPIC ULTRASOUND (EUS) RADIAL;  Surgeon: Bearl Mulberry, MD;  Location: [facility]  ENDOSCOPY;  Service: Gastroenterology;  Laterality: N/A;   FINGER SURGERY     HEMOSTASIS CONTROL  05/05/2023   Procedure: HEMOSTASIS CONTROL;  Surgeon: Jaynie Collins, DO;  Location: [facility] ENDOSCOPY;  Service: Gastroenterology;;   PORTA CATH INSERTION N/A 10/17/2022   Procedure: PORTA CATH INSERTION;  Surgeon: Annice Needy, MD;  Location: [facility] INVASIVE CV LAB;  Service: Cardiovascular;  Laterality: N/A;   TENDON REPAIR IN LEFT KNEE     Social History:  Social History   Socioeconomic History   Marital status: Single    Spouse name: Not on file   Number of children: Not on file   Years of education: Not on file   Highest education level: Not on file  Occupational History   Not on file  Tobacco Use   Smoking status: Never   Smokeless tobacco: Never  Vaping Use   Vaping status: Never Used  Substance and Sexual Activity   Alcohol use: Yes    Comment: OCCASIONALLY   Drug use: Never   Sexual activity: Not on file  Other Topics Concern   Not on file  Social History Narrative   Not on file   Social Drivers of Health   Financial Resource Strain: Low Risk  (10/29/2022)   [facility], [facility]   Overall Financial Resource Strain (CARDIA)    Difficulty of Paying Living Expenses: Not hard at all  Food Insecurity: No Food Insecurity (05/30/2023)   Hunger Vital Sign    Worried About Running Out of Food in the Last Year: Never true    Ran Out of Food in the Last Year: Never true  Transportation Needs: No Transportation Needs (05/30/2023)   PRAPARE - Administrator, Civil Service (Medical): No    Lack of Transportation (Non-Medical): No  Physical Activity: Not on file  Stress: Not on file  Social Connections: Not on file  Intimate Partner Violence: Not At Risk (05/30/2023)   Humiliation, Afraid, Rape, and Kick questionnaire    Fear of Current or Ex-Partner: No    Emotionally Abused: No    Physically Abused: No     Sexually Abused: No   Family History:  Family History  Problem Relation Age of Onset   Heart attack Mother    Prostate cancer Father    Breast cancer Sister    Prostate cancer Paternal Uncle     Review of Systems: Constitutional: Doesn't report fevers, chills or abnormal weight loss Eyes: Doesn't report blurriness of vision Ears, nose, mouth, throat, and face: Doesn't report sore throat Respiratory: Doesn't report cough, dyspnea or wheezes Cardiovascular: Doesn't report palpitation, chest discomfort  Gastrointestinal:  Doesn't report nausea, constipation, diarrhea GU: Doesn't report incontinence Skin: Doesn't report skin rashes Neurological: Per HPI Musculoskeletal: Doesn't report joint pain Behavioral/Psych: Doesn't report anxiety  Physical Exam: Vitals:   09/12/23 0934  BP: 96/79  Pulse: (!) 113  Resp: 19  Temp: (!) 96.6 F (35.9 C)  SpO2: 98%   KPS: 90. General: Alert, cooperative, pleasant, in no acute distress Head: Normal EENT: No conjunctival injection or scleral icterus.  Lungs: Resp effort normal Cardiac: Regular rate Abdomen: Non-distended abdomen Skin: No rashes cyanosis or petechiae. Extremities: No clubbing or edema  Neurologic Exam: Mental Status: Awake, alert, attentive  to examiner. Oriented to self and environment. Language is fluent with intact comprehension.  Cranial Nerves: Visual acuity is grossly normal. Visual fields are full. Extra-ocular movements intact. No ptosis. Face is symmetric Motor: Tone and bulk are normal. Power is full in both arms and legs. Reflexes are symmetric, no pathologic reflexes present.  Sensory: Intact to light touch Gait: Normal.   Labs: I have reviewed the data as listed    Component Value Date/Time   NA 129 (L) 09/05/2023 1426   K 3.5 09/05/2023 1426   CL 96 (L) 09/05/2023 1426   CO2 23 09/05/2023 1426   GLUCOSE 116 (H) 09/05/2023 1426   BUN 15 09/05/2023 1426   CREATININE 0.80 09/05/2023 1426   CREATININE  0.99 09/02/2023 0826   CALCIUM 8.8 (L) 09/05/2023 1426   PROT 6.7 09/05/2023 1426   ALBUMIN 3.1 (L) 09/05/2023 1426   AST 32 09/05/2023 1426   AST 30 09/02/2023 0826   ALT 30 09/05/2023 1426   ALT 32 09/02/2023 0826   ALKPHOS 133 (H) 09/05/2023 1426   BILITOT 0.5 09/05/2023 1426   BILITOT 0.6 09/02/2023 0826   GFRNONAA >60 09/05/2023 1426   GFRNONAA >60 09/02/2023 0826   Lab Results  Component Value Date   WBC 3.3 (L) 09/05/2023   NEUTROABS 2.0 09/05/2023   HGB 11.5 (L) 09/05/2023   HCT 35.7 (L) 09/05/2023   MCV 85.0 09/05/2023   PLT 342 09/05/2023    Imaging:  ECHOCARDIOGRAM COMPLETE Result Date: 08/26/2023    ECHOCARDIOGRAM REPORT   Patient Name:   URHO RIO [facility] Date of Exam: 08/26/2023 Medical Rec #:  [id number]         Height:       69.0 in Accession #:    [id number]        Weight:       170.2 lb Date of Birth:  [birth year]         BSA:          1.929 m Patient Age:    67 years          BP:           97/83 mmHg Patient Gender: M                 HR:           100 bpm. Exam Location:  [facility] Procedure: 2D Echo, Cardiac Doppler, Color Doppler and Intracardiac            Opacification Agent Indications:     Tachycardia R 00.0  History:         Patient has prior history of Echocardiogram examinations, most                  recent 08/12/2023. Risk Factors:Hypertension.  Sonographer:     Cristela Blue Referring Phys:  [id number] Marlyn Corporal MADIREDDY Diagnosing Phys: Yvonne Kendall MD IMPRESSIONS  1. Left ventricular ejection fraction, by estimation, is 45 to 50%. Left ventricular ejection fraction by 3D volume is 46 %. The left ventricle has mildly decreased function. The left ventricle demonstrates regional wall motion abnormalities (see scoring diagram/findings for description). Left ventricular diastolic parameters are consistent with Grade I diastolic dysfunction (impaired relaxation). The average left ventricular global longitudinal strain is -11.0 %. The global longitudinal strain is abnormal.   2. Right ventricular systolic function is normal. The right ventricular size is normal.  3. The mitral valve is normal in structure. Trivial mitral valve regurgitation.  4. The  aortic valve is tricuspid. Aortic valve regurgitation is not visualized. No aortic stenosis is present. Comparison(s): A prior study was performed on 08/12/2023. Compared to prior echo on 08/12/2023, LVEF is now mildly reduced with subtle apical septal and apical hypokinesis. FINDINGS  Left Ventricle: Left ventricular ejection fraction, by estimation, is 45 to 50%. Left ventricular ejection fraction by 3D volume is 46 %. The left ventricle has mildly decreased function. The left ventricle demonstrates regional wall motion abnormalities. Definity contrast agent was given IV to delineate the left ventricular endocardial borders. The average left ventricular global longitudinal strain is -11.0 %. The global longitudinal strain is abnormal. The left ventricular internal cavity size was normal in size. There is no left ventricular hypertrophy. Left ventricular diastolic parameters are consistent with Grade I diastolic dysfunction (impaired relaxation).  LV Wall Scoring: The apical septal segment and apex are hypokinetic. The entire anterior wall, entire lateral wall, anterior septum, entire inferior wall, mid inferoseptal segment, and basal inferoseptal segment are normal. Right Ventricle: The right ventricular size is normal. No increase in right ventricular wall thickness. Right ventricular systolic function is normal. Left Atrium: Left atrial size was normal in size. Right Atrium: Right atrial size was normal in size. Pericardium: The pericardium was not well visualized. Mitral Valve: The mitral valve is normal in structure. Trivial mitral valve regurgitation. Tricuspid Valve: The tricuspid valve is normal in structure. Tricuspid valve regurgitation is mild. Aortic Valve: The aortic valve is tricuspid. Aortic valve regurgitation is not  visualized. No aortic stenosis is present. Aortic valve mean gradient measures 1.0 mmHg. Aortic valve peak gradient measures 2.2 mmHg. Aortic valve area, by VTI measures 5.17 cm. Pulmonic Valve: The pulmonic valve was not well visualized. Pulmonic valve regurgitation is not visualized. No evidence of pulmonic stenosis. Aorta: The aortic root is normal in size and structure. Pulmonary Artery: The pulmonary artery is not well seen. IAS/Shunts: The interatrial septum was not well visualized.  LEFT VENTRICLE PLAX 2D LVIDd:         3.30 cm         Diastology LVIDs:         2.50 cm         LV e' medial:    10.30 cm/s LV PW:         0.96 cm         LV E/e' medial:  5.3 LV IVS:        1.00 cm         LV e' lateral:   6.09 cm/s LVOT diam:     2.20 cm         LV E/e' lateral: 8.9 LV SV:         51 LV SV Index:   27              2D LVOT Area:     3.80 cm        Longitudinal                                Strain                                2D Strain GLS  -11.0 %  Avg:                                 3D Volume EF                                LV 3D EF:    Left                                             ventricul                                             ar                                             ejection                                             fraction                                             by 3D                                             volume is                                             46 %.                                 3D Volume EF:                                3D EF:        46 % RIGHT VENTRICLE RV Basal diam:  2.60 cm  PULMONARY VEINS RV Mid diam:    2.30 cm  Diastolic Velocity: 6.85 cm/s LEFT ATRIUM             Index       RIGHT ATRIUM           Index LA diam:        2.60 cm 1.35 cm/m  RA Area:     10.20 cm LA Vol (A2C):   7.4 ml  3.84 ml/m  RA Volume:   21.50 ml  11.15 ml/m LA Vol (A4C):   8.3 ml  4.30 ml/m LA Biplane Vol: 8.3 ml  4.30 ml/m  AORTIC VALVE  AV Area (Vmax):    3.42 cm AV  Area (Vmean):   3.41 cm AV Area (VTI):     5.17 cm AV Vmax:           74.60 cm/s AV Vmean:          50.100 cm/s AV VTI:            0.099 m AV Peak Grad:      2.2 mmHg AV Mean Grad:      1.0 mmHg LVOT Vmax:         67.10 cm/s LVOT Vmean:        45.000 cm/s LVOT VTI:          0.135 m LVOT/AV VTI ratio: 1.36  AORTA Ao Root diam: 3.70 cm MITRAL VALVE               TRICUSPID VALVE MV Area (PHT): 3.01 cm    TR Peak grad:   16.2 mmHg MV Decel Time: 252 msec    TR Vmax:        201.00 cm/s MV E velocity: 54.50 cm/s MV A velocity: 94.30 cm/s  SHUNTS MV E/A ratio:  0.58        Systemic VTI:  0.14 m                            Systemic Diam: 2.20 cm Yvonne Kendall MD Electronically signed by Yvonne Kendall MD Signature Date/Time: 08/26/2023/6:33:04 PM    Final      Assessment/Plan TIA (transient ischemic attack)  Chemotherapy-induced neuropathy (HCC)  Katharine Look presents with clinical syndrome consistent with TIA/stroke, localizing to right frontal lobe or internal capsule.  MRI brain did not demonstrate diffusion abnormality.  Further workup was mostly unremarkable; there was some atheromatous disease within bilateral carotids, left more than right.  Recommended continuing ASA 81mg  daily for secondary prevention.  Lipid panel shows normal or low serum LDL, may remain off statin therapy.  We spent twenty additional minutes teaching regarding the natural history, biology, and historical experience in the treatment of neurologic complications of cancer and stroke.   We appreciate the opportunity to participate in the care of PERRIS CONWELL.   All questions were answered. The patient knows to call the clinic with any problems, questions or concerns. No barriers to learning were detected.  The total time spent in the encounter was 40 minutes and more than 50% was on counseling and review of test results   Henreitta Leber, MD Medical Director of  Neuro-Oncology [facility] 09/12/23 11:49 AM

## 2023-09-15 ENCOUNTER — Telehealth: Payer: Self-pay | Admitting: *Deleted

## 2023-09-15 NOTE — Telephone Encounter (Signed)
The PT guy did not get him up for treatment. B/p 82/63. I called the patient to see how he is doing. He says he is sitting and not much energy. He is coming in  am and I asked him to talk about if you needs fluids when you come in. Dr. Cathie Hoops already knows that to.

## 2023-09-16 ENCOUNTER — Ambulatory Visit: Payer: No Typology Code available for payment source | Admitting: Physical Therapy

## 2023-09-16 ENCOUNTER — Inpatient Hospital Stay (HOSPITAL_BASED_OUTPATIENT_CLINIC_OR_DEPARTMENT_OTHER): Payer: No Typology Code available for payment source | Admitting: Oncology

## 2023-09-16 ENCOUNTER — Encounter: Payer: Self-pay | Admitting: Oncology

## 2023-09-16 ENCOUNTER — Inpatient Hospital Stay: Payer: No Typology Code available for payment source

## 2023-09-16 ENCOUNTER — Inpatient Hospital Stay (HOSPITAL_BASED_OUTPATIENT_CLINIC_OR_DEPARTMENT_OTHER): Payer: No Typology Code available for payment source | Admitting: Hospice and Palliative Medicine

## 2023-09-16 VITALS — BP 108/81 | HR 85

## 2023-09-16 VITALS — BP 80/55 | HR 114 | Temp 96.6°F | Resp 18 | Wt 161.1 lb

## 2023-09-16 DIAGNOSIS — I82621 Acute embolism and thrombosis of deep veins of right upper extremity: Secondary | ICD-10-CM

## 2023-09-16 DIAGNOSIS — C159 Malignant neoplasm of esophagus, unspecified: Secondary | ICD-10-CM

## 2023-09-16 DIAGNOSIS — G459 Transient cerebral ischemic attack, unspecified: Secondary | ICD-10-CM | POA: Diagnosis not present

## 2023-09-16 DIAGNOSIS — I959 Hypotension, unspecified: Secondary | ICD-10-CM | POA: Insufficient documentation

## 2023-09-16 DIAGNOSIS — C155 Malignant neoplasm of lower third of esophagus: Secondary | ICD-10-CM | POA: Diagnosis not present

## 2023-09-16 DIAGNOSIS — J984 Other disorders of lung: Secondary | ICD-10-CM | POA: Diagnosis not present

## 2023-09-16 DIAGNOSIS — Z515 Encounter for palliative care: Secondary | ICD-10-CM | POA: Diagnosis not present

## 2023-09-16 DIAGNOSIS — E861 Hypovolemia: Secondary | ICD-10-CM | POA: Diagnosis not present

## 2023-09-16 MED ORDER — HEPARIN SOD (PORK) LOCK FLUSH 100 UNIT/ML IV SOLN
500.0000 [IU] | Freq: Once | INTRAVENOUS | Status: AC
  Administered 2023-09-16: 500 [IU] via INTRAVENOUS
  Filled 2023-09-16: qty 5

## 2023-09-16 MED ORDER — SODIUM CHLORIDE 0.9% FLUSH
10.0000 mL | Freq: Once | INTRAVENOUS | Status: AC
  Administered 2023-09-16: 10 mL via INTRAVENOUS
  Filled 2023-09-16: qty 10

## 2023-09-16 MED ORDER — SODIUM CHLORIDE 0.9 % IV SOLN
Freq: Once | INTRAVENOUS | Status: AC
  Filled 2023-09-16: qty 250

## 2023-09-16 NOTE — Assessment & Plan Note (Signed)
Due to radiation/immunotherapy.  SOB cough symptoms are stable  Continue 20mg  daily

## 2023-09-16 NOTE — Progress Notes (Signed)
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 KRAS G12V, TMB 5.3, MSI Stable.  CPS 65%  1st line  FOLFOX , palliative RT --> 12/2022 CT progression--> switch to 5-FU + Nivolumab Q2 weeks  --> 02/2023 PET progression- pneumonitis--> 03/27/23  3rd line  Taxol and Cyramza --> 05/02/23 CT during admission showed slight decrease of liver lesion size. --> 10/30 PET obtained by radonc -liver lesion 10cm, bone mets. --> 4th line FOLFIRI Refer to [facility] for expert opinion. He declined.  GI bleeding due to esophageal cancer, s/p  palliative RT Labs are reviewed and discussed with patient. CT chest abdomen pelvis w contrast showed disease progression.  Discontinue FOLFIRI.  Options of switching to regorafenib or longsuf discussed with patient.  Patient declined further treatment and is interested in hospice. He will discuss with Laurette Schimke today    Deep venous thrombosis (HCC) Off Eliquis 2.5mg  BID due to GI bleeding On Aspirin 81mg  daily now.   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  SOB cough symptoms are stable  Continue 20mg  daily    TIA (transient ischemic attack) He had stroke work up during hospitalization. Possible PFO Refer to neurology.  Continue Aspirin 81mg  daily,  finished 3 weeks of plavix 75mg  daily .   Hypotension IVF 1L NS. BP improved. Tachycardia resolved.  BP 108/81, HR 85      Orders Placed This Encounter  Procedures   Osmolality    Standing Status:   Future    Expected Date:    09/16/2023    Expiration Date:   09/15/2024   Osmolality, urine    Standing Status:   Future    Expected Date:   09/16/2023    Expiration Date:   09/15/2024   Sodium, urine, random    Standing Status:   Future    Expected Date:   09/16/2023    Expiration Date:   09/15/2024    All questions were answered. The patient knows to call the clinic with any problems, questions or concerns.  Rickard Patience, MD, PhD [facility] Hematology Oncology 09/16/2023   HISTORY OF PRESENTING ILLNESS:  Jonathan [name] 68 y.o. male presents to establish care for esophageal adenocarcinoma I have reviewed his chart and materials related to his cancer extensively and collaborated history with the patient. Summary of oncologic history is as follows: Oncology History  Adenocarcinoma of esophagus (HCC)  08/28/2022 Initial Diagnosis   Adenocarcinoma of esophagus   -Patient has noticed worsening of "food stuck/fullness" sensation since November 2023.  Patient had a barium swallow study which commented on marked mucosal irregularity in the distal esophagus with Broaddus base mural filling defect highly suspicious for malignancy.  Patient establish care with gastroenterology. -08/23/2022, EGD showed gastritis and partially obstructing malignant esophageal tumor in the lower third of the esophagus. Esophagus mass biopsy showed adenocarcinoma.  PD-L1 TPS 65%, HER2 negative.  [facility] NGS showed KRASG12V, CDKN2A, ARID1A, TP53, TMB 5.3, MSI stable.  Stomach biopsy showed gastric mucosa with no specific histology abnormality.  No significant intestinal metaplastic, dysplastic, granular atrophy or increased inflammation.     08/28/2022 Imaging  CT chest abdomen pelvis with contrast showed 1. Distal esophageal primary with gastrohepatic ligament nodal metastasis. 2. 2 right-sided pulmonary nodules, the largest of which measures 5 mm and is new since 2015. Pulmonary metastasis not be excluded. 3. Anterior right lower lobe volume  loss and minimal soft tissue density, favoring atelectasis or scar. Recommend attention on follow-up. 4. Hepatic steatosis 5. Cholelithiasis 6. Left nephrolithiasis 7. Coronary artery atherosclerosis. Aortic Atherosclerosis   08/28/2022 Cancer Staging   Staging form: Esophagus - Adenocarcinoma, AJCC 8th Edition - Clinical stage from 08/28/2022: Stage IVB (cT3, cN1, cM1) - Signed by Rickard Patience, MD on 10/04/2022 Stage prefix: Initial diagnosis   09/06/2022 Imaging   PET scan showed 1. Esophageal primary with gastrohepatic ligament nodal metastasis,as on CT. 2. Focus of hypermetabolism which is favored to registered to the posterior hepatic dome, in the region of subtle heterogeneity on prior diagnostic CT. Suboptimally evaluated secondary to underlying steatosis. Recommend further evaluation with pre and post contrast abdominal MRI to confirm probable metastasis. 3. Incidental findings, including: Left nephrolithiasis.Cholelithiasis. Coronary artery atherosclerosis. Aortic Atherosclerosis    09/23/2022 Imaging   MRI abdomen with and without contrast showed 1. Mildly T2 hyperintense segment VII hepatic lesion measuring 2.7 cm with imaging characteristics compatible with metastatic disease. 2. Tiny focus of delayed enhancement in the inferior right lobe of the liver segment VI measuring 8 mm with ill-defined increased T2 signal and subtle corresponding reduced diffusivity, also suspicious for metastatic disease. 3. Partially visualized distal esophageal wall thickening compatible with the patient's known primary neoplasm. 4. Similar size of the 11 mm gastrohepatic ligament lymph node mildly metabolic on prior PET-CT and compatible with local nodal disease involvement. 5. 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 Recommend follow up pre and post-contrast MRI/MRCP in 1 year. 6. Diffuse hepatic steatosis.   10/10/2022  Procedure   LIVER MASS; CT-GUIDED BIOPSY:  - MODERATE TO POORLY DIFFERENTIATED ADENOCARCINOMA MORPHOLOGICALLY  CONSISTENT WITH METASTASIS FROM PATIENT'S KNOWN ESOPHAGEAL  ADENOCARCINOMA.    10/17/2022 Procedure   Medi port placed by Dr. Wyn Quaker   10/21/2022 - 01/15/2023 Chemotherapy   Patient is on Treatment Plan : ESOPHAGEAL ADENOCARCINOMA FOLFOX q14d x 6 cycles     01/15/2023 Imaging   PET showed  1. Interval progression in metastatic esophageal carcinoma as evidenced by hypermetabolic lymph nodes in the neck, chest, abdomen and pelvis, an enlarging right hepatic lobe metastasis, new bilateral adrenal metastases and new osseous metastases. 2. Cholelithiasis. 3. Left renal stones. 4. Aortic atherosclerosis (ICD10-I70.0). Coronary artery calcification.    01/27/2023 - 03/14/2023 Chemotherapy   Patient is on Treatment Plan : GASTROESOPHAGEAL FOLFOX + Nivolumab q14d     03/20/2023 Imaging   CT chest abdomen pelvis w contrast showed 1. Interval progression of metastatic disease with multiple new and enlarged hypodense lesions throughout the liver. 2. Significant enlargement of bilateral adrenal metastases. 3. Subtle sclerosis of an osseous metastasis of the right femoral neck. Other previously FDG avid osseous metastases of the left femoral neck and right sixth rib not appreciated by CT. 4. No persistently enlarged lymph nodes. 5. Interval development of bandlike consolidation and fibrosis of the paramedian right lung, particularly of the right lower lobe, as well as additional scattered irregular opacities throughout the right lung. 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 6. Similar circumferential wall thickening throughout the mid to lower esophagus, consistent with known primary esophageal adenocarcinoma. 7. Nonobstructive bilateral  nephrolithiasis. Aortic Atherosclerosis   03/27/2023 - 06/19/2023 Chemotherapy   Patient is on Treatment Plan :  GASTROESOPHAGEAL Ramucirumab D1, 15 + Paclitaxel D1,8,15 q28d     05/02/2023 - 05/08/2023 Hospital Admission   Admission due to coffee-ground emesis. EGD 10/7 showed bleeding from esophageal cancer, esophagitis due to chemo and radiation. Applied Hemospray. Eliquis was stopped. Acute blood loss anemia, received IV venofer treatments x 3 doses    05/28/2023 Imaging   1. Progressive hypermetabolism in the distal esophagus consistent with progressive disease. 2. Significant progression of metastatic hepatic disease (new and enlarging lesions). 3. Enlarging and progressively hypermetabolic right adrenal gland mass. 4. New hypermetabolic bone lesions in the T12 and L2 vertebral bodies, left sacrum and right sixth posterior rib consistent with metastatic disease. 5. Possible hypermetabolic brain lesions. MRI suggested for further evaluation. 6. Chronic medial right lower lobe atelectasis likely radiation change. Moderate hypermetabolism but no obvious tumor. 7. Stable gallstones, renal calculi and vascular calcifications.   06/19/2023 Imaging   CT chest angiogram w contrast, CT abdomen pelvis w contrast  1. Negative for pulmonary embolus. 2. Persistent lower esophageal wall thickening with progressive hepatic and right adrenal metastatic disease, compatible with stage IV esophageal carcinoma, as on PET 05/21/2023. 3. 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  4. 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 5. 4.0 cm ascending aortic aneurysm. Recommend annual imaging follow up by CTA or MRA. This recommendation follows 2010 ACCF/AHA/AATS/ACR/ASA/SCA/SCAI/SIR/STS/SVM Guidelines for the Diagnosis and Management of Patients with Thoracic Aortic Disease.Circulation. 2010; 121: G956-O130. Aortic aneurysm NOS  (ICD10-I71.9). 6. Cholelithiasis. 7. Small left renal stone. 8. Aortic atherosclerosis (ICD10-I70.0). Left and descending coronary artery calcification.   06/30/2023 -  Chemotherapy   Patient is on Treatment Plan : COLORECTAL FOLFIRI q14d     09/02/2023 Tumor Marker   CEA 234   09/09/2023 Imaging   CT chest abdomen pelvis w contrast showed  1. Diminished circumferential wall thickening of the lower esophagus, consistent with treatment response of primary esophageal malignancy. 2. Interval increase in size and number of multiple hypodense liver metastases. 3. No significant change in right adrenal metastasis. 4. Patient's known osseous metastatic disease is generally not well appreciated by CT. 5. Nonspecific small volume free fluid in the pelvis. 6. Cholelithiasis. 7. Coronary artery disease.   Patient presents to establish care.  He is not taking PPI. He has intentionally lost some weight. Family history positive for father and paternal uncle with prostate cancer and sister with breast cancer. Denies any routine alcohol use  Right interval jugular vein occlusive DVT, started on Eliquis starter package on 10/28/2022, swelling of neck has improved.   CXR showed Interval development of bilateral perihilar interstitial opacities  He was treated with Azithromycin and Doxycycline to cover atypical infections.   Jan 2025 TIA event, hospitalized and he completed stroke work up Brain MRI negative for any acute intracranial findings.  CTA head and neck negative for large vessel occlusion, atherosclerotic changes noticed.  TTE shows LVEF 55 to 60%, grade 1 diastolic dysfunction, positive PFO   INTERVAL HISTORY Jonathan [name] is a 67 y.o. male who has above history reviewed by me today presents for follow up visit for Stage IV esophageal adenocarcinoma.  Today he report feeling well. No focal weakness.  No nausea vomiting diarrhea.  Chronic nasal congestion, postnatal drip, mucus  production.  Some SOB at baseline,  poor appetite, not eating well.  + weakness.  + weight loss he has not tried Marinol yet.     MEDICAL HISTORY:  Past Medical History:  Diagnosis Date   Arthritis    Complication of anesthesia    OCCURRED ONCE YEARS AGO 1981   Deep venous thrombosis (HCC) 10/28/2022   Esophageal mass    Essential hypertension 05/01/2023   Hypertension    Hypothyroidism    PONV (postoperative nausea and vomiting)     SURGICAL HISTORY: Past Surgical History:  Procedure Laterality Date   COLONOSCOPY     ESOPHAGOGASTRODUODENOSCOPY N/A 08/23/2022   Procedure: ESOPHAGOGASTRODUODENOSCOPY (EGD);  Surgeon: Jaynie Collins, DO;  Location: [facility] ENDOSCOPY;  Service: Gastroenterology;  Laterality: N/A;   ESOPHAGOGASTRODUODENOSCOPY (EGD) WITH PROPOFOL N/A 05/05/2023   Procedure: ESOPHAGOGASTRODUODENOSCOPY (EGD) WITH PROPOFOL;  Surgeon: Jaynie Collins, DO;  Location: [facility] ENDOSCOPY;  Service: Gastroenterology;  Laterality: N/A;   EUS N/A 09/12/2022   Procedure: FULL UPPER ENDOSCOPIC ULTRASOUND (EUS) RADIAL;  Surgeon: Bearl Mulberry, MD;  Location: [facility] ENDOSCOPY;  Service: Gastroenterology;  Laterality: N/A;   FINGER SURGERY     HEMOSTASIS CONTROL  05/05/2023   Procedure: HEMOSTASIS CONTROL;  Surgeon: Jaynie Collins, DO;  Location: [facility] ENDOSCOPY;  Service: Gastroenterology;;   PORTA CATH INSERTION N/A 10/17/2022   Procedure: PORTA CATH INSERTION;  Surgeon: Annice Needy, MD;  Location: [facility] INVASIVE CV LAB;  Service: Cardiovascular;  Laterality: N/A;   TENDON REPAIR IN LEFT KNEE      SOCIAL HISTORY: Social History   Socioeconomic History   Marital status: Single    Spouse name: Not on file   Number of children: Not on file   Years of education: Not on file   Highest education level: Not on file  Occupational History   Not on file  Tobacco Use   Smoking status: Never   Smokeless tobacco: Never  Vaping Use   Vaping status: Never Used   Substance and Sexual Activity   Alcohol use: Yes    Comment: OCCASIONALLY   Drug use: Never   Sexual activity: Not on file  Other Topics Concern   Not on file  Social History Narrative   Not on file   Social Drivers of Health   Financial Resource Strain: Low Risk  (10/29/2022)   Received from [facility], [facility]   Overall Financial Resource Strain (CARDIA)    Difficulty of Paying Living Expenses: Not hard at all  Food Insecurity: No Food Insecurity (05/30/2023)   Hunger Vital Sign    Worried About Running Out of Food in the Last Year: Never true    Ran Out of Food in the Last Year: Never true  Transportation Needs: No Transportation Needs (05/30/2023)   PRAPARE - Administrator, Civil Service (Medical): No    Lack of Transportation (Non-Medical): No  Physical Activity: Not on file  Stress: Not on file  Social Connections: Not on file  Intimate Partner Violence: Not At Risk (05/30/2023)   Humiliation, Afraid, Rape, and Kick questionnaire    Fear of Current or Ex-Partner: No    Emotionally Abused: No    Physically Abused: No    Sexually Abused: No    FAMILY HISTORY: Family History  Problem Relation Age of Onset   Heart attack Mother    Prostate cancer Father    Breast cancer Sister    Prostate cancer Paternal Uncle     ALLERGIES:  has no known allergies.  MEDICATIONS:  Current Outpatient Medications  Medication Sig Dispense Refill   acetaminophen (TYLENOL) 650 MG CR tablet Take 1,300 mg by mouth every 8 (eight) hours as needed for pain.     aspirin EC 81 MG tablet Take 1 tablet (81 mg total) by mouth daily. Swallow whole. 30 tablet 12   azelastine (ASTELIN) 0.1 % nasal spray Place 1 spray into both nostrils 2 (two) times daily. Use in each nostril as directed 30 mL 1   chlorhexidine (PERIDEX) 0.12 % solution USE AS DIRECTED TAKE  15  ML  IN  THE  MOUTH  OR  THROAT  TWICE  DAILY 473 mL 0   dronabinol (MARINOL) 5 MG  capsule Take 1 capsule (5 mg total) by mouth 2 (two) times daily before lunch and supper. 60 capsule 0   gentamicin ointment (GARAMYCIN) 0.1 % Apply 1 Application topically 2 (two) times daily as needed.     ipratropium (ATROVENT) 0.03 % nasal spray Place 2 sprays into both nostrils 2 (two) times daily.     ketoconazole (NIZORAL) 2 % shampoo Apply 1 Application topically 2 (two) times a week.     levothyroxine (SYNTHROID) 125 MCG tablet Take 125 mcg by mouth every morning.     loperamide (IMODIUM) 2 MG capsule Take 2 tabs by mouth with first loose stool, then 1 tab with each additional loose stool as needed. Do not exceed 8 tabs in a 24-hour period 90 capsule 0   magic mouthwash (multi-ingredient) oral suspension Swish and spit 5-10 mLs by mouth 4 (four) times daily as needed. 480 mL 1   metoprolol tartrate (LOPRESSOR) 25 MG tablet Take 1 tablet (25 mg total) by mouth 2 (two) times daily. 180 tablet 3   OLANZapine (ZYPREXA) 10 MG tablet Take 0.5-1 tablets (5-10 mg total) by mouth at bedtime as needed (nausea). 30 tablet 3   pantoprazole (PROTONIX) 40 MG tablet Take 1 tablet (40 mg total) by mouth 2 (two) times daily. 60 tablet 3   potassium chloride SA (KLOR-CON M) 20 MEQ tablet Take 2 tablets (40 mEq total) by mouth 2 (two) times daily. 60 tablet 1   predniSONE (DELTASONE) 10 MG tablet Take 2 tablets (20 mg total) by mouth daily with breakfast. 60 tablet 0   prochlorperazine (COMPAZINE) 10 MG tablet Take 10 mg by mouth every 6 (six) hours as needed.     No current facility-administered medications for this visit.    Review of Systems  Constitutional:  Positive for appetite change and fatigue. Negative for chills and fever.  HENT:   Negative for hearing loss and voice change.        Nasal congestion, postnasal drip.   Eyes:  Negative for eye problems and icterus.  Respiratory:  Negative for chest tightness, cough and shortness of breath.   Cardiovascular:  Negative for chest pain and leg  swelling.  Gastrointestinal:  Positive for nausea. Negative for abdominal distention, abdominal pain and vomiting.  Endocrine: Negative for hot flashes.  Genitourinary:  Negative for difficulty urinating, dysuria and frequency.   Musculoskeletal:  Negative for arthralgias.  Skin:  Negative for itching and rash.  Neurological:  Positive for extremity weakness and numbness. Negative for light-headedness.  Hematological:  Negative for adenopathy. Does not bruise/bleed easily.  Psychiatric/Behavioral:  Negative for confusion.   See interval history.    PHYSICAL EXAMINATION: ECOG PERFORMANCE STATUS: 1 - Symptomatic but completely ambulatory  Vitals:   09/16/23 0840 09/16/23 0852  BP: (!) 92/43 (!) 80/55  Pulse: (!) 114   Resp: 18   Temp: (!) 96.6 F (35.9 C)   SpO2: 99%     Filed Weights   09/16/23 0840  Weight: 161 lb 1.6 oz (73.1 kg)      Physical Exam Constitutional:      General: He is not in acute distress.    Appearance: He is obese. He is not diaphoretic.  HENT:     Head: Normocephalic and atraumatic.  Eyes:     General: No scleral icterus. Cardiovascular:     Rate and Rhythm: Normal rate and regular rhythm.  Pulmonary:     Effort: Pulmonary effort is normal. No respiratory distress.     Breath sounds: No wheezing.  Abdominal:     General: There is no distension.     Palpations: Abdomen is soft.  Musculoskeletal:        General: Normal range of motion.     Cervical back: Normal range of motion and neck supple.  Skin:    General: Skin is warm and dry.     Findings: No erythema.  Neurological:     General: No focal deficit present.     Mental Status: He is alert and oriented to person, place, and time. Mental status is at baseline.     Motor: No abnormal muscle tone.     Comments: No focal deficit   Psychiatric:        Mood and Affect: Mood and affect normal.      LABORATORY DATA:  I have reviewed the data as listed    Latest Ref Rng & Units 09/05/2023     2:26 PM 09/02/2023    8:26 AM 08/19/2023    8:52 AM  CBC  WBC 4.0 - 10.5 K/uL 3.3  3.4  9.5   Hemoglobin 13.0 - 17.0 g/dL 84.6  96.2  95.2   Hematocrit 39.0 - 52.0 % 35.7  38.4  40.3   Platelets 150 - 400 K/uL 342  245  303       Latest Ref Rng & Units 09/05/2023    2:26 PM 09/02/2023    8:26 AM 08/19/2023    8:52 AM  CMP  Glucose 70 - 99 mg/dL 841  324  96   BUN 8 - 23 mg/dL 15  17  14    Creatinine 0.61 - 1.24 mg/dL 4.01  0.27  2.53   Sodium 135 - 145 mmol/L 129  136  135   Potassium 3.5 - 5.1 mmol/L 3.5  3.1  3.0   Chloride 98 - 111 mmol/L 96  101  100   CO2 22 - 32 mmol/L 23  22  23    Calcium 8.9 - 10.3 mg/dL 8.8  9.2  9.2   Total Protein 6.5 - 8.1 g/dL 6.7  7.1  7.2   Total Bilirubin 0.0 - 1.2 mg/dL 0.5  0.6  0.5   Alkaline Phos 38 - 126 U/L 133  151  105   AST 15 - 41 U/L 32  30  30   ALT 0 - 44 U/L 30  32  22      RADIOGRAPHIC STUDIES: I have personally reviewed the radiological images as listed and agreed with the findings in the report. CT CHEST ABDOMEN PELVIS W CONTRAST Result Date: 09/15/2023 CLINICAL DATA:  Metastatic esophageal cancer * Tracking Code: BO * EXAM: CT CHEST, ABDOMEN, AND PELVIS WITH CONTRAST TECHNIQUE: Multidetector CT imaging of the chest, abdomen and pelvis was performed following the standard  protocol during bolus administration of intravenous contrast. RADIATION DOSE REDUCTION: This exam was performed according to the departmental dose-optimization program which includes automated exposure control, adjustment of the mA and/or kV according to patient size and/or use of iterative reconstruction technique. CONTRAST:  OMNIPAQUE IOHEXOL 300 MG/ML  SOLN COMPARISON:  CT chest angiogram abdomen pelvis, 06/19/2023 PET-CT, 05/21/2023 FINDINGS: CT CHEST FINDINGS Cardiovascular: Aortic atherosclerosis. Normal heart size. Scattered left coronary artery calcifications no pericardial effusion. Mediastinum/Nodes: No enlarged mediastinal, hilar, or axillary lymph  nodes. Diminished circumferential wall thickening of the lower esophagus (series 2, image 31). Lungs/Pleura: Unchanged fibrotic scarring and volume loss of the medial right lower lobe (series 3, image 81). No pleural effusion or pneumothorax. Musculoskeletal: No chest wall abnormality. No acute osseous findings. CT ABDOMEN PELVIS FINDINGS Hepatobiliary: Interval increase in size and number of multiple hypodense liver metastases, index lesion in the superior left lobe of the liver hepatic segment IVA measuring 3.7 x 3.4 cm, previously 1.7 x 1.5 cm (series 2, image 47). Index lesion in the inferior right lobe of the liver, hepatic segment VI, measures 2.5 x 2.2 cm, previously 0.8 cm (series 2, image 66). Small gallstones. Gallbladder wall thickening, or biliary dilatation. Pancreas: Unremarkable. No pancreatic ductal dilatation or surrounding inflammatory changes. Spleen: Normal in size without significant abnormality. Adrenals/Urinary Tract: No significant change in hypodense right adrenal mass measuring 4.7 x 4.0 cm (series 2, image 55). Simple, benign renal cortical cysts, for which no further follow-up or characterization required. Kidneys are otherwise normal, without renal calculi, solid lesion, or hydronephrosis. Bladder is unremarkable. Stomach/Bowel: Stomach is within normal limits. Appendix appears normal. No evidence of bowel wall thickening, distention, or inflammatory changes. Sigmoid diverticulosis Vascular/Lymphatic: Scattered aortic atherosclerosis. No enlarged abdominal or pelvic lymph nodes. Reproductive: No mass or other abnormality. Other: No abdominal wall hernia or abnormality. Small volume free fluid in the low pelvis. Musculoskeletal: No acute osseous findings. Patient's known osseous metastatic disease is not well appreciated by CT, subtly present for example at the left aspect of L2 (series 2, image 65). IMPRESSION: 1. Diminished circumferential wall thickening of the lower esophagus,  consistent with treatment response of primary esophageal malignancy. 2. Interval increase in size and number of multiple hypodense liver metastases. 3. No significant change in right adrenal metastasis. 4. Patient's known osseous metastatic disease is generally not well appreciated by CT. 5. Nonspecific small volume free fluid in the pelvis. 6. Cholelithiasis. 7. Coronary artery disease. Aortic Atherosclerosis (ICD10-I70.0). Electronically Signed   By: Jearld Lesch M.D.   On: 09/15/2023 16:51   ECHOCARDIOGRAM COMPLETE Result Date: 08/26/2023    ECHOCARDIOGRAM REPORT   Patient Name:   Jonathan [name] [facility] Date of Exam: 08/26/2023 Medical Rec #:  [id number]         Height:       69.0 in Accession #:    [id number]        Weight:       170.2 lb Date of Birth:  11/30/1955         BSA:          1.929 m Patient Age:    67 years          BP:           97/83 mmHg Patient Gender: M                 HR:           100 bpm. Exam Location:  [facility] Procedure: 2D Echo, Cardiac  Doppler, Color Doppler and Intracardiac            Opacification Agent Indications:     Tachycardia R 00.0  History:         Patient has prior history of Echocardiogram examinations, most                  recent 08/12/2023. Risk Factors:Hypertension.  Sonographer:     Cristela Blue Referring Phys:  [id number] Marlyn Corporal MADIREDDY Diagnosing Phys: Yvonne Kendall MD IMPRESSIONS  1. Left ventricular ejection fraction, by estimation, is 45 to 50%. Left ventricular ejection fraction by 3D volume is 46 %. The left ventricle has mildly decreased function. The left ventricle demonstrates regional wall motion abnormalities (see scoring diagram/findings for description). Left ventricular diastolic parameters are consistent with Grade I diastolic dysfunction (impaired relaxation). The average left ventricular global longitudinal strain is -11.0 %. The global longitudinal strain is abnormal.  2. Right ventricular systolic function is normal. The right ventricular size is normal.   3. The mitral valve is normal in structure. Trivial mitral valve regurgitation.  4. The aortic valve is tricuspid. Aortic valve regurgitation is not visualized. No aortic stenosis is present. Comparison(s): A prior study was performed on 08/12/2023. Compared to prior echo on 08/12/2023, LVEF is now mildly reduced with subtle apical septal and apical hypokinesis. FINDINGS  Left Ventricle: Left ventricular ejection fraction, by estimation, is 45 to 50%. Left ventricular ejection fraction by 3D volume is 46 %. The left ventricle has mildly decreased function. The left ventricle demonstrates regional wall motion abnormalities. Definity contrast agent was given IV to delineate the left ventricular endocardial borders. The average left ventricular global longitudinal strain is -11.0 %. The global longitudinal strain is abnormal. The left ventricular internal cavity size was normal in size. There is no left ventricular hypertrophy. Left ventricular diastolic parameters are consistent with Grade I diastolic dysfunction (impaired relaxation).  LV Wall Scoring: The apical septal segment and apex are hypokinetic. The entire anterior wall, entire lateral wall, anterior septum, entire inferior wall, mid inferoseptal segment, and basal inferoseptal segment are normal. Right Ventricle: The right ventricular size is normal. No increase in right ventricular wall thickness. Right ventricular systolic function is normal. Left [facility]: Left atrial size was normal in size. Right [facility]: Right atrial size was normal in size. Pericardium: The pericardium was not well visualized. Mitral Valve: The mitral valve is normal in structure. Trivial mitral valve regurgitation. Tricuspid Valve: The tricuspid valve is normal in structure. Tricuspid valve regurgitation is mild. Aortic Valve: The aortic valve is tricuspid. Aortic valve regurgitation is not visualized. No aortic stenosis is present. Aortic valve mean gradient measures 1.0 mmHg. Aortic  valve peak gradient measures 2.2 mmHg. Aortic valve area, by VTI measures 5.17 cm. Pulmonic Valve: The pulmonic valve was not well visualized. Pulmonic valve regurgitation is not visualized. No evidence of pulmonic stenosis. Aorta: The aortic root is normal in size and structure. Pulmonary Artery: The pulmonary artery is not well seen. IAS/Shunts: The interatrial septum was not well visualized.  LEFT VENTRICLE PLAX 2D LVIDd:         3.30 cm         Diastology LVIDs:         2.50 cm         LV e' medial:    10.30 cm/s LV PW:         0.96 cm         LV E/e' medial:  5.3 LV IVS:  1.00 cm         LV e' lateral:   6.09 cm/s LVOT diam:     2.20 cm         LV E/e' lateral: 8.9 LV SV:         51 LV SV Index:   27              2D LVOT Area:     3.80 cm        Longitudinal                                Strain                                2D Strain GLS  -11.0 %                                Avg:                                 3D Volume EF                                LV 3D EF:    Left                                             ventricul                                             ar                                             ejection                                             fraction                                             by 3D                                             volume is                                             46 %.  3D Volume EF:                                3D EF:        46 % RIGHT VENTRICLE RV Basal diam:  2.60 cm  PULMONARY VEINS RV Mid diam:    2.30 cm  Diastolic Velocity: 6.85 cm/s LEFT ATRIUM             Index       RIGHT ATRIUM           Index LA diam:        2.60 cm 1.35 cm/m  RA Area:     10.20 cm LA Vol (A2C):   7.4 ml  3.84 ml/m  RA Volume:   21.50 ml  11.15 ml/m LA Vol (A4C):   8.3 ml  4.30 ml/m LA Biplane Vol: 8.3 ml  4.30 ml/m  AORTIC VALVE AV Area (Vmax):    3.42 cm AV Area (Vmean):   3.41 cm AV Area (VTI):     5.17 cm AV Vmax:            74.60 cm/s AV Vmean:          50.100 cm/s AV VTI:            0.099 m AV Peak Grad:      2.2 mmHg AV Mean Grad:      1.0 mmHg LVOT Vmax:         67.10 cm/s LVOT Vmean:        45.000 cm/s LVOT VTI:          0.135 m LVOT/AV VTI ratio: 1.36  AORTA Ao Root diam: 3.70 cm MITRAL VALVE               TRICUSPID VALVE MV Area (PHT): 3.01 cm    TR Peak grad:   16.2 mmHg MV Decel Time: 252 msec    TR Vmax:        201.00 cm/s MV E velocity: 54.50 cm/s MV A velocity: 94.30 cm/s  SHUNTS MV E/A ratio:  0.58        Systemic VTI:  0.14 m                            Systemic Diam: 2.20 cm Yvonne Kendall MD Electronically signed by Yvonne Kendall MD Signature Date/Time: 08/26/2023/6:33:04 PM    Final

## 2023-09-16 NOTE — Assessment & Plan Note (Signed)
IVF 1L NS. BP improved. Tachycardia resolved.  BP 108/81, HR 85

## 2023-09-16 NOTE — Assessment & Plan Note (Signed)
 He had stroke work up during hospitalization. Possible PFO Refer to neurology.  Continue Aspirin 81mg  daily,  finished 3 weeks of plavix 75mg  daily .

## 2023-09-16 NOTE — Assessment & Plan Note (Addendum)
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 KRAS G12V, TMB 5.3, MSI Stable.  CPS 65%  1st line  FOLFOX , palliative RT --> 12/2022 CT progression--> switch to 5-FU + Nivolumab Q2 weeks  --> 02/2023 PET progression- pneumonitis--> 03/27/23  3rd line  Taxol and Cyramza --> 05/02/23 CT during admission showed slight decrease of liver lesion size. --> 10/30 PET obtained by radonc -liver lesion 10cm, bone mets. --> 4th line FOLFIRI Refer to [facility] for expert opinion. He declined.  GI bleeding due to esophageal cancer, s/p  palliative RT Labs are reviewed and discussed with patient. CT chest abdomen pelvis w contrast showed disease progression.  Discontinue FOLFIRI.  Options of switching to regorafenib or longsuf discussed with patient.  Patient declined further treatment and is interested in hospice. He will discuss with Laurette Schimke today

## 2023-09-16 NOTE — Progress Notes (Signed)
Palliative Medicine [facility] Telephone:([phone]) [phone] [phone]) [phone]   Name: Jonathan [name] Date: 09/16/2023 MRN: [id number]  DOB: April 10, 1956  Patient Care Team: [name], MD as PCP - General (Family Medicine) [name], RN as Oncology Nurse Navigator Rickard Patience, MD as Consulting Physician (Oncology)    REASON FOR CONSULTATION: Jonathan [name] is a 68 y.o. 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  He was referred to palliative care to address goals.  SOCIAL HISTORY:     reports that he has never smoked. He has never used smokeless tobacco. He reports current alcohol use. He reports that he does not use drugs.  Patient unmarried has no children.  Lives at home alone.  ADVANCE DIRECTIVES:  Not on file  CODE STATUS: DNR/DNI (DNR order signed on 07/07/23)  PAST MEDICAL HISTORY: Past Medical History:  Diagnosis Date   Arthritis    Complication of anesthesia    OCCURRED ONCE YEARS AGO 1981   Deep venous thrombosis (HCC) 10/28/2022   Esophageal mass    Essential hypertension 05/01/2023   Hypertension    Hypothyroidism    PONV (postoperative nausea and vomiting)     PAST SURGICAL HISTORY:  Past Surgical History:  Procedure Laterality Date   COLONOSCOPY     ESOPHAGOGASTRODUODENOSCOPY N/A 08/23/2022   Procedure: ESOPHAGOGASTRODUODENOSCOPY (EGD);  Surgeon: Jaynie Collins, DO;  Location: [facility] ENDOSCOPY;  Service: Gastroenterology;  Laterality: N/A;   ESOPHAGOGASTRODUODENOSCOPY (EGD) WITH PROPOFOL N/A 05/05/2023   Procedure: ESOPHAGOGASTRODUODENOSCOPY (EGD) WITH PROPOFOL;  Surgeon: Jaynie Collins, DO;  Location: [facility] ENDOSCOPY;  Service: Gastroenterology;  Laterality: N/A;   EUS N/A 09/12/2022   Procedure: FULL UPPER ENDOSCOPIC ULTRASOUND (EUS) RADIAL;  Surgeon:  Bearl Mulberry, MD;  Location: [facility] ENDOSCOPY;  Service: Gastroenterology;  Laterality: N/A;   FINGER SURGERY     HEMOSTASIS CONTROL  05/05/2023   Procedure: HEMOSTASIS CONTROL;  Surgeon: Jaynie Collins, DO;  Location: [facility] ENDOSCOPY;  Service: Gastroenterology;;   PORTA CATH INSERTION N/A 10/17/2022   Procedure: PORTA CATH INSERTION;  Surgeon: Annice Needy, MD;  Location: [facility] INVASIVE CV LAB;  Service: Cardiovascular;  Laterality: N/A;   TENDON REPAIR IN LEFT KNEE      HEMATOLOGY/ONCOLOGY HISTORY:  Oncology History  Adenocarcinoma of esophagus (HCC)  08/28/2022 Initial Diagnosis   Adenocarcinoma of esophagus   -Patient has noticed worsening of "food stuck/fullness" sensation since November 2023.  Patient had a barium swallow study which commented on marked mucosal irregularity in the distal esophagus with Broaddus base mural filling defect highly suspicious for malignancy.  Patient establish care with gastroenterology. -08/23/2022, EGD showed gastritis and partially obstructing malignant esophageal tumor in the lower third of the esophagus. Esophagus mass biopsy showed adenocarcinoma.  PD-L1 TPS 65%, HER2 negative.  [facility] NGS showed KRASG12V, CDKN2A, ARID1A, TP53, TMB 5.3, MSI stable.  Stomach biopsy showed gastric mucosa with no specific histology abnormality.  No significant intestinal metaplastic, dysplastic, granular atrophy or increased inflammation.     08/28/2022 Imaging   CT chest abdomen pelvis with contrast showed 1. Distal esophageal primary with gastrohepatic ligament nodal metastasis. 2. 2 right-sided pulmonary nodules, the largest of which measures 5 mm and is new since 2015. Pulmonary metastasis not be excluded. 3. Anterior right lower lobe volume loss and minimal soft tissue density, favoring atelectasis or scar. Recommend attention on follow-up. 4. Hepatic steatosis  5. Cholelithiasis 6. Left nephrolithiasis 7. Coronary artery atherosclerosis. Aortic  Atherosclerosis   08/28/2022 Cancer Staging   Staging form: Esophagus - Adenocarcinoma, AJCC 8th Edition - Clinical stage from 08/28/2022: Stage IVB (cT3, cN1, cM1) - Signed by Rickard Patience, MD on 10/04/2022 Stage prefix: Initial diagnosis   09/06/2022 Imaging   PET scan showed 1. Esophageal primary with gastrohepatic ligament nodal metastasis,as on CT. 2. Focus of hypermetabolism which is favored to registered to the posterior hepatic dome, in the region of subtle heterogeneity on prior diagnostic CT. Suboptimally evaluated secondary to underlying steatosis. Recommend further evaluation with pre and post contrast abdominal MRI to confirm probable metastasis. 3. Incidental findings, including: Left nephrolithiasis.Cholelithiasis. Coronary artery atherosclerosis. Aortic Atherosclerosis    09/23/2022 Imaging   MRI abdomen with and without contrast showed 1. Mildly T2 hyperintense segment VII hepatic lesion measuring 2.7 cm with imaging characteristics compatible with metastatic disease. 2. Tiny focus of delayed enhancement in the inferior right lobe of the liver segment VI measuring 8 mm with ill-defined increased T2 signal and subtle corresponding reduced diffusivity, also suspicious for metastatic disease. 3. Partially visualized distal esophageal wall thickening compatible with the patient's known primary neoplasm. 4. Similar size of the 11 mm gastrohepatic ligament lymph node mildly metabolic on prior PET-CT and compatible with local nodal disease involvement. 5. 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 Recommend follow up pre and post-contrast MRI/MRCP in 1 year. 6. Diffuse hepatic steatosis.   10/10/2022 Procedure   LIVER MASS; CT-GUIDED BIOPSY:  - MODERATE TO POORLY DIFFERENTIATED ADENOCARCINOMA MORPHOLOGICALLY  CONSISTENT WITH METASTASIS FROM PATIENT'S KNOWN ESOPHAGEAL  ADENOCARCINOMA.    10/17/2022  Procedure   Medi port placed by Dr. Wyn Quaker   10/21/2022 - 01/15/2023 Chemotherapy   Patient is on Treatment Plan : ESOPHAGEAL ADENOCARCINOMA FOLFOX q14d x 6 cycles     01/15/2023 Imaging   PET showed  1. Interval progression in metastatic esophageal carcinoma as evidenced by hypermetabolic lymph nodes in the neck, chest, abdomen and pelvis, an enlarging right hepatic lobe metastasis, new bilateral adrenal metastases and new osseous metastases. 2. Cholelithiasis. 3. Left renal stones. 4. Aortic atherosclerosis (ICD10-I70.0). Coronary artery calcification.    01/27/2023 - 03/14/2023 Chemotherapy   Patient is on Treatment Plan : GASTROESOPHAGEAL FOLFOX + Nivolumab q14d     03/20/2023 Imaging   CT chest abdomen pelvis w contrast showed 1. Interval progression of metastatic disease with multiple new and enlarged hypodense lesions throughout the liver. 2. Significant enlargement of bilateral adrenal metastases. 3. Subtle sclerosis of an osseous metastasis of the right femoral neck. Other previously FDG avid osseous metastases of the left femoral neck and right sixth rib not appreciated by CT. 4. No persistently enlarged lymph nodes. 5. Interval development of bandlike consolidation and fibrosis of the paramedian right lung, particularly of the right lower lobe, as well as additional scattered irregular opacities throughout the right lung. 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 6. Similar circumferential wall thickening throughout the mid to lower esophagus, consistent with known primary esophageal adenocarcinoma. 7. Nonobstructive bilateral nephrolithiasis. Aortic Atherosclerosis   03/27/2023 - 06/19/2023 Chemotherapy   Patient is on Treatment Plan : GASTROESOPHAGEAL Ramucirumab D1, 15 + Paclitaxel D1,8,15 q28d     05/02/2023 - 05/08/2023 Hospital Admission   Admission due to coffee-ground emesis. EGD 10/7 showed bleeding from esophageal cancer,  esophagitis due to chemo and radiation. Applied Hemospray. Eliquis was stopped. Acute  blood loss anemia, received IV venofer treatments x 3 doses    05/28/2023 Imaging   1. Progressive hypermetabolism in the distal esophagus consistent with progressive disease. 2. Significant progression of metastatic hepatic disease (new and enlarging lesions). 3. Enlarging and progressively hypermetabolic right adrenal gland mass. 4. New hypermetabolic bone lesions in the T12 and L2 vertebral bodies, left sacrum and right sixth posterior rib consistent with metastatic disease. 5. Possible hypermetabolic brain lesions. MRI suggested for further evaluation. 6. Chronic medial right lower lobe atelectasis likely radiation change. Moderate hypermetabolism but no obvious tumor. 7. Stable gallstones, renal calculi and vascular calcifications.   06/19/2023 Imaging   CT chest angiogram w contrast, CT abdomen pelvis w contrast  1. Negative for pulmonary embolus. 2. Persistent lower esophageal wall thickening with progressive hepatic and right adrenal metastatic disease, compatible with stage IV esophageal carcinoma, as on PET 05/21/2023. 3. 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  4. 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 5. 4.0 cm ascending aortic aneurysm. Recommend annual imaging follow up by CTA or MRA. This recommendation follows 2010 ACCF/AHA/AATS/ACR/ASA/SCA/SCAI/SIR/STS/SVM Guidelines for the Diagnosis and Management of Patients with Thoracic Aortic Disease.Circulation. 2010; 121: Z610-R604. Aortic aneurysm NOS (ICD10-I71.9). 6. Cholelithiasis. 7. Small left renal stone. 8. Aortic atherosclerosis (ICD10-I70.0). Left and descending coronary artery calcification.   06/30/2023 -  Chemotherapy   Patient is on  Treatment Plan : COLORECTAL FOLFIRI q14d       ALLERGIES:  has no known allergies.  MEDICATIONS:  Current Outpatient Medications  Medication Sig Dispense Refill   acetaminophen (TYLENOL) 650 MG CR tablet Take 1,300 mg by mouth every 8 (eight) hours as needed for pain.     aspirin EC 81 MG tablet Take 1 tablet (81 mg total) by mouth daily. Swallow whole. 30 tablet 12   azelastine (ASTELIN) 0.1 % nasal spray Place 1 spray into both nostrils 2 (two) times daily. Use in each nostril as directed 30 mL 1   chlorhexidine (PERIDEX) 0.12 % solution USE AS DIRECTED TAKE  15  ML  IN  THE  MOUTH  OR  THROAT  TWICE  DAILY 473 mL 0   dronabinol (MARINOL) 5 MG capsule Take 1 capsule (5 mg total) by mouth 2 (two) times daily before lunch and supper. 60 capsule 0   gentamicin ointment (GARAMYCIN) 0.1 % Apply 1 Application topically 2 (two) times daily as needed.     ipratropium (ATROVENT) 0.03 % nasal spray Place 2 sprays into both nostrils 2 (two) times daily.     ketoconazole (NIZORAL) 2 % shampoo Apply 1 Application topically 2 (two) times a week.     levothyroxine (SYNTHROID) 125 MCG tablet Take 125 mcg by mouth every morning.     loperamide (IMODIUM) 2 MG capsule Take 2 tabs by mouth with first loose stool, then 1 tab with each additional loose stool as needed. Do not exceed 8 tabs in a 24-hour period 90 capsule 0   magic mouthwash (multi-ingredient) oral suspension Swish and spit 5-10 mLs by mouth 4 (four) times daily as needed. 480 mL 1   metoprolol tartrate (LOPRESSOR) 25 MG tablet Take 1 tablet (25 mg total) by mouth 2 (two) times daily. 180 tablet 3   OLANZapine (ZYPREXA) 10 MG tablet Take 0.5-1 tablets (5-10 mg total) by mouth at bedtime as needed (nausea). 30 tablet 3   pantoprazole (PROTONIX) 40 MG  tablet Take 1 tablet (40 mg total) by mouth 2 (two) times daily. 60 tablet 3   potassium chloride SA (KLOR-CON M) 20 MEQ tablet Take 2 tablets (40 mEq total) by mouth 2 (two) times daily. 60 tablet 1    predniSONE (DELTASONE) 10 MG tablet Take 2 tablets (20 mg total) by mouth daily with breakfast. 60 tablet 0   prochlorperazine (COMPAZINE) 10 MG tablet Take 10 mg by mouth every 6 (six) hours as needed.     No current facility-administered medications for this visit.   Facility-Administered Medications Ordered in Other Visits  Medication Dose Route Frequency Provider Last Rate Last Admin   0.9 %  sodium chloride infusion   Intravenous Once Rickard Patience, MD        VITAL SIGNS: There were no vitals taken for this visit. There were no vitals filed for this visit.  Estimated body mass index is 23.79 kg/m as calculated from the following:   Height as of 08/18/23: 5\' 9"  (1.753 m).   Weight as of an earlier encounter on 09/16/23: 161 lb 1.6 oz (73.1 kg).  LABS: CBC:    Component Value Date/Time   WBC 3.3 (L) 09/05/2023 1426   HGB 11.5 (L) 09/05/2023 1426   HGB 12.0 (L) 09/02/2023 0826   HCT 35.7 (L) 09/05/2023 1426   PLT 342 09/05/2023 1426   PLT 245 09/02/2023 0826   MCV 85.0 09/05/2023 1426   NEUTROABS 2.0 09/05/2023 1426   LYMPHSABS 0.5 (L) 09/05/2023 1426   MONOABS 0.7 09/05/2023 1426   EOSABS 0.0 09/05/2023 1426   BASOSABS 0.0 09/05/2023 1426   Comprehensive Metabolic Panel:    Component Value Date/Time   NA 129 (L) 09/05/2023 1426   K 3.5 09/05/2023 1426   CL 96 (L) 09/05/2023 1426   CO2 23 09/05/2023 1426   BUN 15 09/05/2023 1426   CREATININE 0.80 09/05/2023 1426   CREATININE 0.99 09/02/2023 0826   GLUCOSE 116 (H) 09/05/2023 1426   CALCIUM 8.8 (L) 09/05/2023 1426   AST 32 09/05/2023 1426   AST 30 09/02/2023 0826   ALT 30 09/05/2023 1426   ALT 32 09/02/2023 0826   ALKPHOS 133 (H) 09/05/2023 1426   BILITOT 0.5 09/05/2023 1426   BILITOT 0.6 09/02/2023 0826   PROT 6.7 09/05/2023 1426   ALBUMIN 3.1 (L) 09/05/2023 1426    RADIOGRAPHIC STUDIES: CT CHEST ABDOMEN PELVIS W CONTRAST Result Date: 09/15/2023 CLINICAL DATA:  Metastatic esophageal cancer * Tracking Code: BO *  EXAM: CT CHEST, ABDOMEN, AND PELVIS WITH CONTRAST TECHNIQUE: Multidetector CT imaging of the chest, abdomen and pelvis was performed following the standard protocol during bolus administration of intravenous contrast. RADIATION DOSE REDUCTION: This exam was performed according to the departmental dose-optimization program which includes automated exposure control, adjustment of the mA and/or kV according to patient size and/or use of iterative reconstruction technique. CONTRAST:  OMNIPAQUE IOHEXOL 300 MG/ML  SOLN COMPARISON:  CT chest angiogram abdomen pelvis, 06/19/2023 PET-CT, 05/21/2023 FINDINGS: CT CHEST FINDINGS Cardiovascular: Aortic atherosclerosis. Normal heart size. Scattered left coronary artery calcifications no pericardial effusion. Mediastinum/Nodes: No enlarged mediastinal, hilar, or axillary lymph nodes. Diminished circumferential wall thickening of the lower esophagus (series 2, image 31). Lungs/Pleura: Unchanged fibrotic scarring and volume loss of the medial right lower lobe (series 3, image 81). No pleural effusion or pneumothorax. Musculoskeletal: No chest wall abnormality. No acute osseous findings. CT ABDOMEN PELVIS FINDINGS Hepatobiliary: Interval increase in size and number of multiple hypodense liver metastases, index lesion in the superior  left lobe of the liver hepatic segment IVA measuring 3.7 x 3.4 cm, previously 1.7 x 1.5 cm (series 2, image 47). Index lesion in the inferior right lobe of the liver, hepatic segment VI, measures 2.5 x 2.2 cm, previously 0.8 cm (series 2, image 66). Small gallstones. Gallbladder wall thickening, or biliary dilatation. Pancreas: Unremarkable. No pancreatic ductal dilatation or surrounding inflammatory changes. Spleen: Normal in size without significant abnormality. Adrenals/Urinary Tract: No significant change in hypodense right adrenal mass measuring 4.7 x 4.0 cm (series 2, image 55). Simple, benign renal cortical cysts, for which no further  follow-up or characterization required. Kidneys are otherwise normal, without renal calculi, solid lesion, or hydronephrosis. Bladder is unremarkable. Stomach/Bowel: Stomach is within normal limits. Appendix appears normal. No evidence of bowel wall thickening, distention, or inflammatory changes. Sigmoid diverticulosis Vascular/Lymphatic: Scattered aortic atherosclerosis. No enlarged abdominal or pelvic lymph nodes. Reproductive: No mass or other abnormality. Other: No abdominal wall hernia or abnormality. Small volume free fluid in the low pelvis. Musculoskeletal: No acute osseous findings. Patient's known osseous metastatic disease is not well appreciated by CT, subtly present for example at the left aspect of L2 (series 2, image 65). IMPRESSION: 1. Diminished circumferential wall thickening of the lower esophagus, consistent with treatment response of primary esophageal malignancy. 2. Interval increase in size and number of multiple hypodense liver metastases. 3. No significant change in right adrenal metastasis. 4. Patient's known osseous metastatic disease is generally not well appreciated by CT. 5. Nonspecific small volume free fluid in the pelvis. 6. Cholelithiasis. 7. Coronary artery disease. Aortic Atherosclerosis (ICD10-I70.0). Electronically Signed   By: Jearld Lesch M.D.   On: 09/15/2023 16:51   ECHOCARDIOGRAM COMPLETE Result Date: 08/26/2023    ECHOCARDIOGRAM REPORT   Patient Name:   CARRY ORTEZ [facility] Date of Exam: 08/26/2023 Medical Rec #:  [id number]         Height:       69.0 in Accession #:    [id number]        Weight:       170.2 lb Date of Birth:  06-Nov-1955         BSA:          1.929 m Patient Age:    67 years          BP:           97/83 mmHg Patient Gender: M                 HR:           100 bpm. Exam Location:  [facility] Procedure: 2D Echo, Cardiac Doppler, Color Doppler and Intracardiac            Opacification Agent Indications:     Tachycardia R 00.0  History:         Patient has prior  history of Echocardiogram examinations, most                  recent 08/12/2023. Risk Factors:Hypertension.  Sonographer:     Cristela Blue Referring Phys:  [id number] Marlyn Corporal MADIREDDY Diagnosing Phys: Yvonne Kendall MD IMPRESSIONS  1. Left ventricular ejection fraction, by estimation, is 45 to 50%. Left ventricular ejection fraction by 3D volume is 46 %. The left ventricle has mildly decreased function. The left ventricle demonstrates regional wall motion abnormalities (see scoring diagram/findings for description). Left ventricular diastolic parameters are consistent with Grade I diastolic dysfunction (impaired relaxation). The average left ventricular global longitudinal strain is -11.0 %.  The global longitudinal strain is abnormal.  2. Right ventricular systolic function is normal. The right ventricular size is normal.  3. The mitral valve is normal in structure. Trivial mitral valve regurgitation.  4. The aortic valve is tricuspid. Aortic valve regurgitation is not visualized. No aortic stenosis is present. Comparison(s): A prior study was performed on 08/12/2023. Compared to prior echo on 08/12/2023, LVEF is now mildly reduced with subtle apical septal and apical hypokinesis. FINDINGS  Left Ventricle: Left ventricular ejection fraction, by estimation, is 45 to 50%. Left ventricular ejection fraction by 3D volume is 46 %. The left ventricle has mildly decreased function. The left ventricle demonstrates regional wall motion abnormalities. Definity contrast agent was given IV to delineate the left ventricular endocardial Yuvin Bussiere. The average left ventricular global longitudinal strain is -11.0 %. The global longitudinal strain is abnormal. The left ventricular internal cavity size was normal in size. There is no left ventricular hypertrophy. Left ventricular diastolic parameters are consistent with Grade I diastolic dysfunction (impaired relaxation).  LV Wall Scoring: The apical septal segment and apex are  hypokinetic. The entire anterior wall, entire lateral wall, anterior septum, entire inferior wall, mid inferoseptal segment, and basal inferoseptal segment are normal. Right Ventricle: The right ventricular size is normal. No increase in right ventricular wall thickness. Right ventricular systolic function is normal. Left Atrium: Left atrial size was normal in size. Right Atrium: Right atrial size was normal in size. Pericardium: The pericardium was not well visualized. Mitral Valve: The mitral valve is normal in structure. Trivial mitral valve regurgitation. Tricuspid Valve: The tricuspid valve is normal in structure. Tricuspid valve regurgitation is mild. Aortic Valve: The aortic valve is tricuspid. Aortic valve regurgitation is not visualized. No aortic stenosis is present. Aortic valve mean gradient measures 1.0 mmHg. Aortic valve peak gradient measures 2.2 mmHg. Aortic valve area, by VTI measures 5.17 cm. Pulmonic Valve: The pulmonic valve was not well visualized. Pulmonic valve regurgitation is not visualized. No evidence of pulmonic stenosis. Aorta: The aortic root is normal in size and structure. Pulmonary Artery: The pulmonary artery is not well seen. IAS/Shunts: The interatrial septum was not well visualized.  LEFT VENTRICLE PLAX 2D LVIDd:         3.30 cm         Diastology LVIDs:         2.50 cm         LV e' medial:    10.30 cm/s LV PW:         0.96 cm         LV E/e' medial:  5.3 LV IVS:        1.00 cm         LV e' lateral:   6.09 cm/s LVOT diam:     2.20 cm         LV E/e' lateral: 8.9 LV SV:         51 LV SV Index:   27              2D LVOT Area:     3.80 cm        Longitudinal                                Strain  2D Strain GLS  -11.0 %                                Avg:                                 3D Volume EF                                LV 3D EF:    Left                                             ventricul                                             ar                                              ejection                                             fraction                                             by 3D                                             volume is                                             46 %.                                 3D Volume EF:                                3D EF:        46 % RIGHT VENTRICLE RV Basal diam:  2.60 cm  PULMONARY VEINS RV Mid diam:    2.30 cm  Diastolic Velocity: 6.85 cm/s LEFT ATRIUM             Index       RIGHT ATRIUM           Index LA diam:        2.60 cm 1.35 cm/m  RA Area:     10.20 cm LA Vol (A2C):   7.4 ml  3.84 ml/m  RA Volume:  21.50 ml  11.15 ml/m LA Vol (A4C):   8.3 ml  4.30 ml/m LA Biplane Vol: 8.3 ml  4.30 ml/m  AORTIC VALVE AV Area (Vmax):    3.42 cm AV Area (Vmean):   3.41 cm AV Area (VTI):     5.17 cm AV Vmax:           74.60 cm/s AV Vmean:          50.100 cm/s AV VTI:            0.099 m AV Peak Grad:      2.2 mmHg AV Mean Grad:      1.0 mmHg LVOT Vmax:         67.10 cm/s LVOT Vmean:        45.000 cm/s LVOT VTI:          0.135 m LVOT/AV VTI ratio: 1.36  AORTA Ao Root diam: 3.70 cm MITRAL VALVE               TRICUSPID VALVE MV Area (PHT): 3.01 cm    TR Peak grad:   16.2 mmHg MV Decel Time: 252 msec    TR Vmax:        201.00 cm/s MV E velocity: 54.50 cm/s MV A velocity: 94.30 cm/s  SHUNTS MV E/A ratio:  0.58        Systemic VTI:  0.14 m                            Systemic Diam: 2.20 cm Yvonne Kendall MD Electronically signed by Yvonne Kendall MD Signature Date/Time: 08/26/2023/6:33:04 PM    Final     PERFORMANCE STATUS (ECOG) : 1 - Symptomatic but completely ambulatory  Review of Systems Unless otherwise noted, a complete review of systems is negative.  Physical Exam General: NAD Pulmonary: Unlabored Extremities: no edema, no joint deformities Skin: no rashes Neurological: Weakness but otherwise nonfocal  IMPRESSION: Follow-up visit.    Unfortunately, CT chest abdomen and pelvis on  09/09/2023 showed interval increase in size and number of hypodense liver metastasis suggestive of disease progression.  He did have diminished circumferential wall thickening of the lower esophagus consistent with treatment response.  Patient met with Dr. Cathie Hoops today and has decided to forego further treatment.  I met with patient to discuss goals.  Patient confirmed that his primary desire at this point is to focus on comfort and quality of life.  He is in agreement with hospice involvement to achieve his goal.  PLAN: -Continue current scope of treatment -DNR/DNI -Referral to hospice -Follow-up as needed  Case and plan discussed with Dr. Cathie Hoops  Patient expressed understanding and was in agreement with this plan. He also understands that He can call the clinic at any time with any questions, concerns, or complaints.     Time Total: 20 minutes  Visit consisted of counseling and education dealing with the complex and emotionally intense issues of symptom management and palliative care in the setting of serious and potentially life-threatening illness.Greater than 50%  of this time was spent counseling and coordinating care related to the above assessment and plan.  Signed by: Laurette Schimke, PhD, NP-C

## 2023-09-16 NOTE — Assessment & Plan Note (Signed)
 Off Eliquis 2.5mg  BID due to GI bleeding On Aspirin 81mg  daily now.

## 2023-09-16 NOTE — Progress Notes (Signed)
Pt received 1L IVF via port-a-cath. Tolerated well. Pt stable for discharge, no concerns voiced. Josh Borders NP informed of Vitals: 1BP 108/81, HR 85.

## 2023-09-18 ENCOUNTER — Ambulatory Visit: Payer: No Typology Code available for payment source | Admitting: Physical Therapy

## 2023-09-21 IMAGING — MR MR BRAIN/IAC WO/W CM
10 of 14 series · 27 of 48 positions shown · IV contrast (gadavist)
Comparison: No pertinent prior exams available for comparison.

CLINICAL DATA: Tinnitus, right ear. Additional history provided by
scanning technologist: Patient reports right ear stuffiness,
"vibrates while talking."

EXAM:
MRI HEAD WITHOUT AND WITH CONTRAST
TECHNIQUE: Multiplanar, multiecho pulse sequences of the brain and surrounding
structures were obtained without and with intravenous contrast.
CONTRAST:  10mL GADAVIST GADOBUTROL 1 MMOL/ML IV SOLN

[Series 5: T1 · sagittal · 5.0mm · 0.62mm/px · 3 of 23 slices shown (1 of 3)]
[im 1/23]
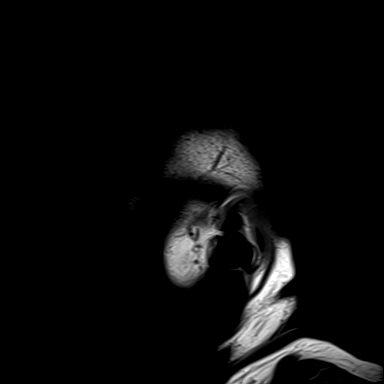
[im 12/23]
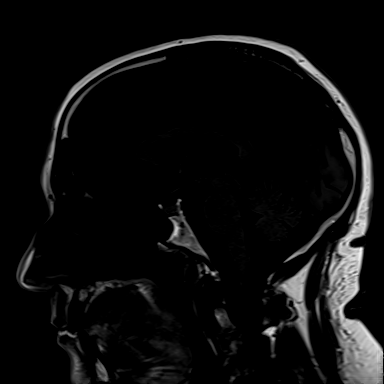
[im 23/23]
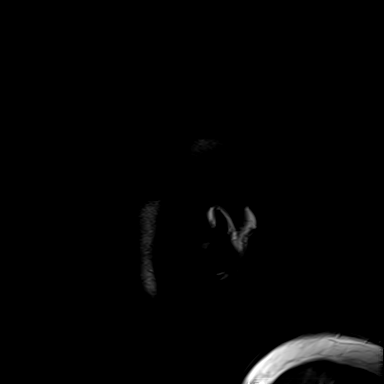

[Series 6: T2 · axial · 5.0mm · 0.55mm/px · z∈[-83,+72]mm · 2 of 28 slices shown]
[im 1/28]
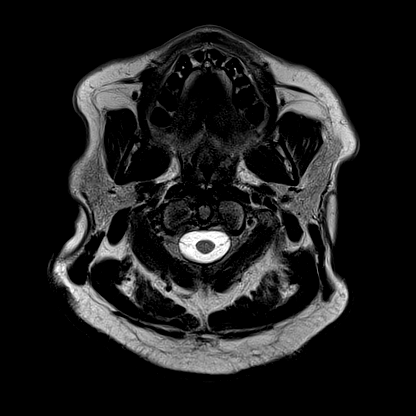
[im 28/28]
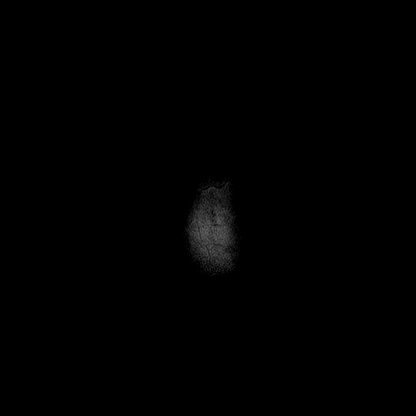

[Series 7: ax dwi_tracew · axial · 3.0mm · 0.65mm/px · z∈[-83,+72]mm · 3 of 50 slices shown]
[im 1/50]
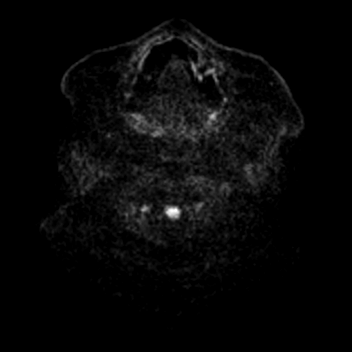
[im 25/50]
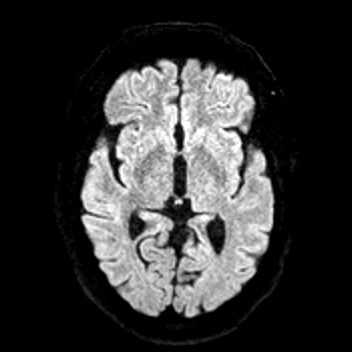
[im 50/50]
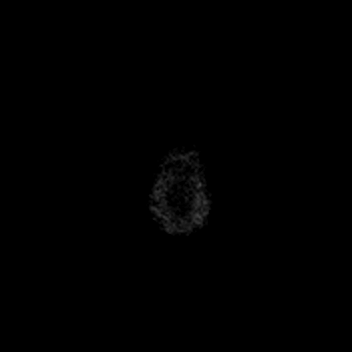

[Series 8: ax dwi_adc · axial · 3.0mm · 0.65mm/px · z∈[-83,-7]mm · 2 of 50 slices shown]
[im 1/50]
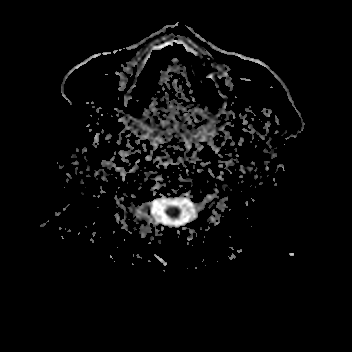
[im 25/50]
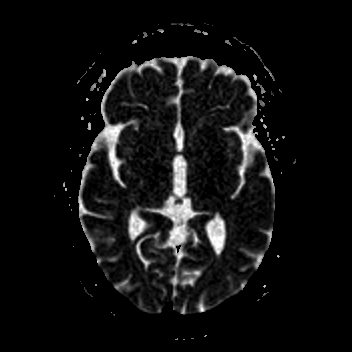

[Series 13: FLAIR · axial · 3.0mm · 0.53mm/px · z∈[-85,+71]mm · 4 of 55 slices shown]
[im 1/55]
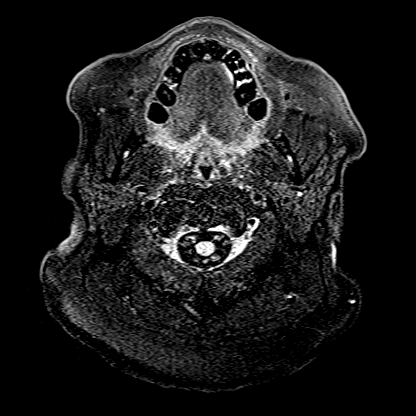
[im 19/55]
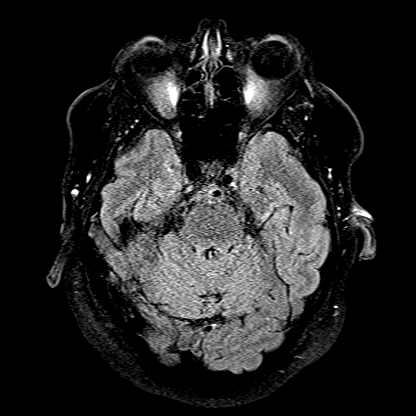
[im 37/55]
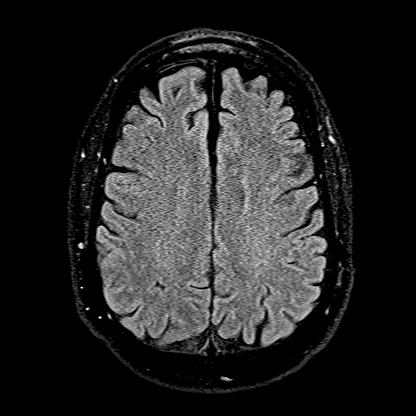
[im 55/55]
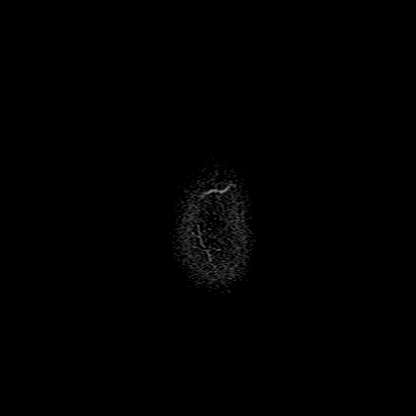

[Series 14: T1 · coronal · non-contrast · 3.0mm · 0.21mm/px · 1 of 13 slices shown (2 of 3)]
[im 1/13]
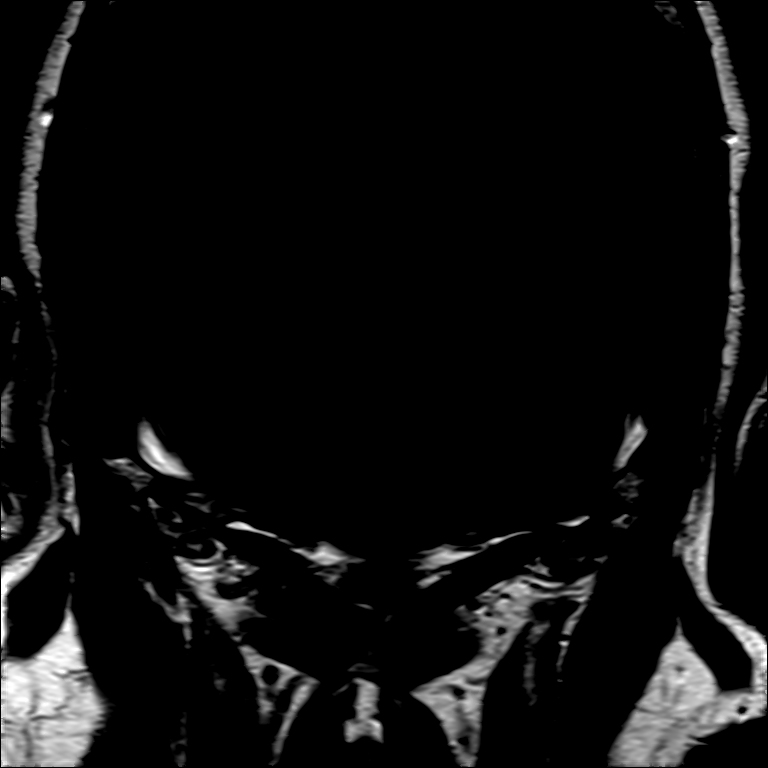

[Series 16: T1 · axial · non-contrast · 3.0mm · 0.21mm/px · 1 of 17 slices shown (3 of 3)]
[im 1/17]
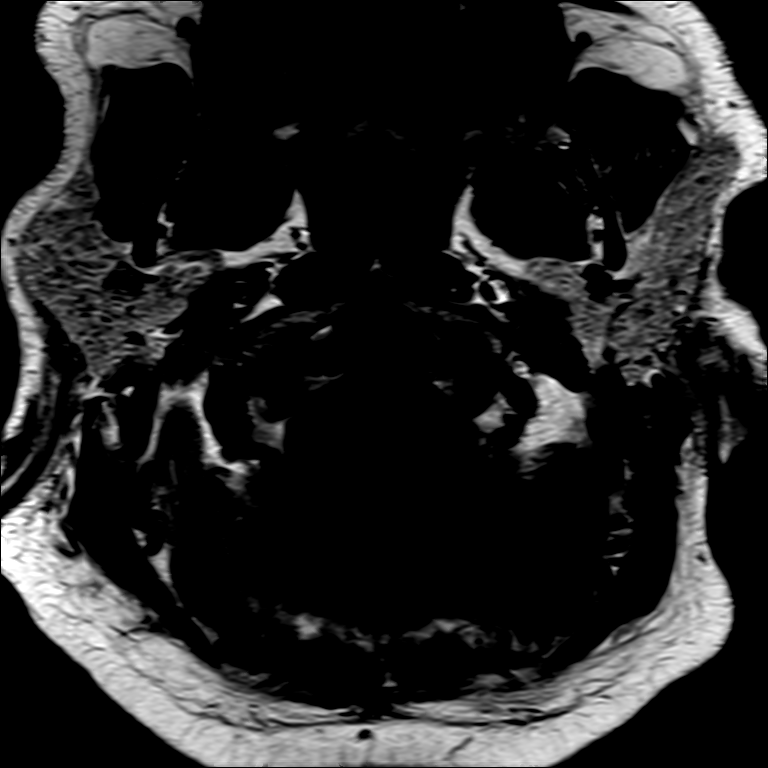

[Series 17: T1 post-contrast · axial · 3.0mm · 0.21mm/px · 1 of 17 slices shown (1 of 3)]
[im 1/17]
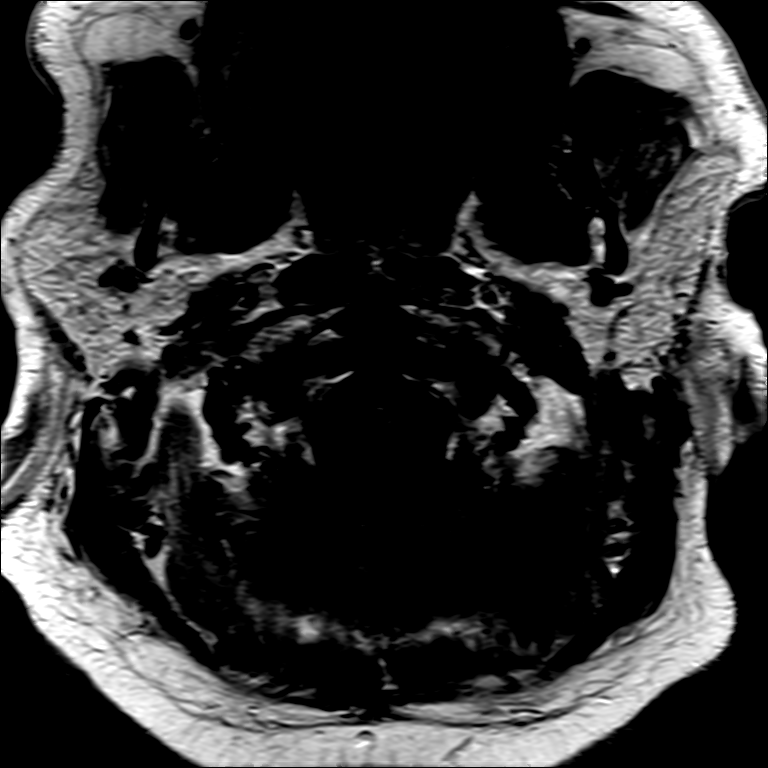

[Series 18: T1 post-contrast · coronal · 3.0mm · 0.21mm/px · 1 of 13 slices shown (2 of 3)]
[im 1/13]
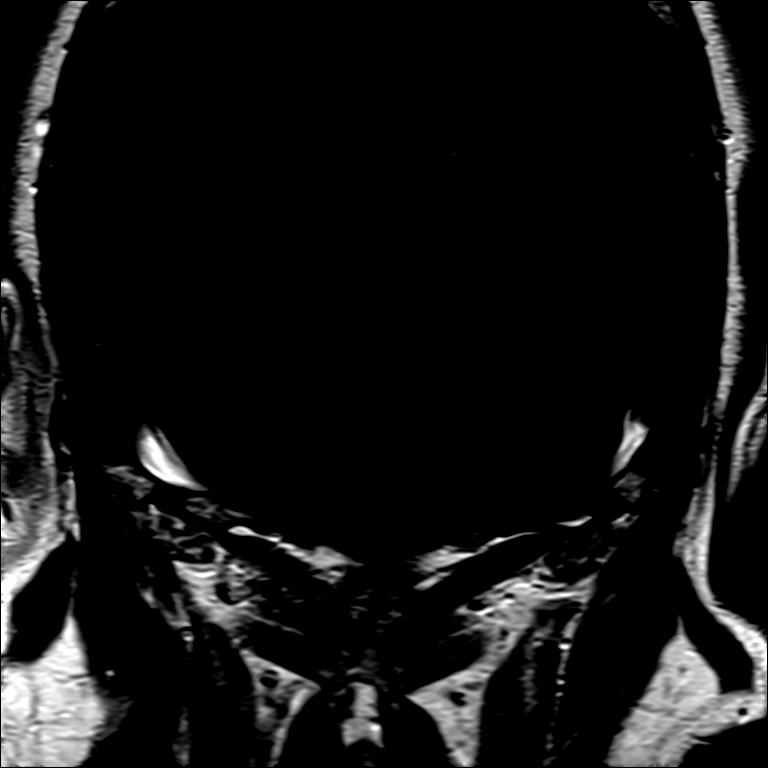

[Series 19: T1 post-contrast · axial · 1.0mm · 0.98mm/px · z∈[-87,+81]mm · 9 of 176 slices shown (3 of 3)]
[im 1/176]
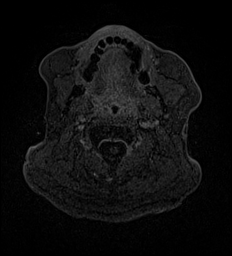
[im 32/176]
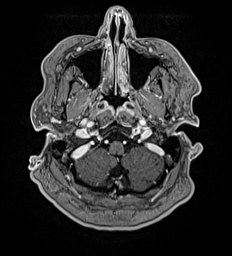
[im 48/176]
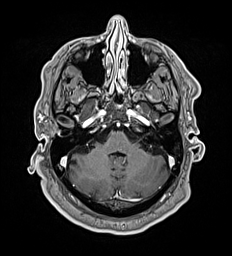
[im 80/176]
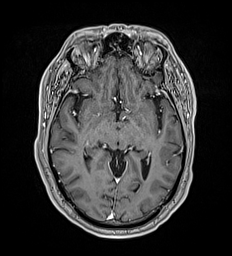
[im 96/176]
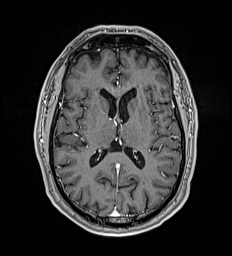
[im 128/176]
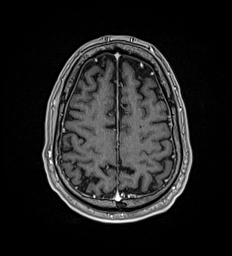
[im 144/176]
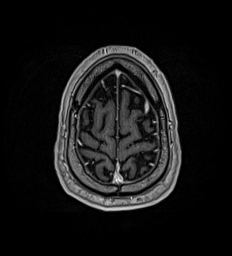
[im 160/176]
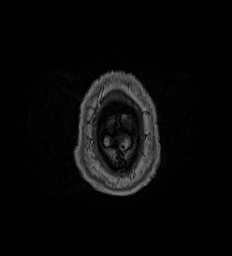
[im 176/176]
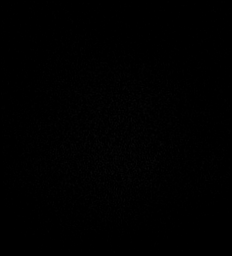

[27 of 48 positions shown; findings below may reference images not displayed]

FINDINGS: Brain:

Cerebral volume is normal.

No cortical encephalomalacia is identified.

A few small scattered foci of T2/FLAIR hyperintense signal
abnormality within the cerebral white matter are nonspecific, but
compatible with chronic small vessel ischemic disease.

Punctate chronic microhemorrhage within the medial left cerebellar
hemisphere.

No evidence of an intracranial mass. Specifically, no
cerebellopontine angle or internal auditory canal masses
demonstrated. Unremarkable appearance of the seventh and eighth
cranial nerves.

There is no acute infarct.

No extra-axial fluid collection.

No midline shift.

No pathologic intracranial enhancement identified.

Vascular: Maintained flow voids within the proximal large arterial
vessels.

Skull and upper cervical spine: No focal suspicious marrow lesion.

Sinuses/Orbits: Visualized orbits show no acute finding. No
significant paranasal sinus disease.

Other: Trace fluid within the bilateral mastoid air cells.
IMPRESSION: No evidence of acute intracranial abnormality.

No cerebellopontine angle or internal auditory canal mass, or cause
of tinnitus, identified.

Minimal chronic small-vessel ischemic changes within the cerebral
white matter.

Trace fluid within the bilateral mastoid air cells.

## 2023-09-23 ENCOUNTER — Ambulatory Visit: Payer: No Typology Code available for payment source | Admitting: Physical Therapy

## 2023-09-25 ENCOUNTER — Ambulatory Visit: Payer: No Typology Code available for payment source | Admitting: Physical Therapy

## 2023-09-29 ENCOUNTER — Encounter: Payer: Self-pay | Admitting: Oncology

## 2023-09-30 ENCOUNTER — Ambulatory Visit: Payer: No Typology Code available for payment source | Admitting: Physical Therapy

## 2023-10-02 ENCOUNTER — Ambulatory Visit: Payer: No Typology Code available for payment source | Admitting: Physical Therapy

## 2023-10-07 ENCOUNTER — Telehealth: Payer: No Typology Code available for payment source | Admitting: Hospice and Palliative Medicine

## 2023-10-09 ENCOUNTER — Ambulatory Visit: Payer: No Typology Code available for payment source | Admitting: Physical Therapy

## 2023-10-13 ENCOUNTER — Inpatient Hospital Stay: Payer: No Typology Code available for payment source | Admitting: Hospice and Palliative Medicine

## 2023-10-14 ENCOUNTER — Ambulatory Visit: Payer: No Typology Code available for payment source | Admitting: Physical Therapy

## 2023-10-16 ENCOUNTER — Ambulatory Visit: Payer: No Typology Code available for payment source | Admitting: Physical Therapy

## 2023-10-21 ENCOUNTER — Ambulatory Visit: Payer: No Typology Code available for payment source | Admitting: Physical Therapy

## 2023-10-23 ENCOUNTER — Ambulatory Visit: Payer: No Typology Code available for payment source | Admitting: Physical Therapy

## 2023-10-28 ENCOUNTER — Ambulatory Visit: Payer: No Typology Code available for payment source | Admitting: Physical Therapy

## 2023-10-28 DEATH — deceased

## 2023-10-30 ENCOUNTER — Ambulatory Visit: Payer: No Typology Code available for payment source | Admitting: Physical Therapy

## 2023-11-04 ENCOUNTER — Ambulatory Visit: Payer: No Typology Code available for payment source | Admitting: Physical Therapy

## 2023-11-06 ENCOUNTER — Ambulatory Visit: Payer: No Typology Code available for payment source | Admitting: Physical Therapy

## 2023-11-11 ENCOUNTER — Ambulatory Visit: Payer: No Typology Code available for payment source | Admitting: Physical Therapy

## 2023-11-13 ENCOUNTER — Ambulatory Visit: Payer: No Typology Code available for payment source | Admitting: Physical Therapy

## 2023-11-18 ENCOUNTER — Ambulatory Visit: Payer: No Typology Code available for payment source | Admitting: Physical Therapy

## 2023-11-20 ENCOUNTER — Ambulatory Visit: Payer: No Typology Code available for payment source | Admitting: Physical Therapy

## 2023-11-25 ENCOUNTER — Ambulatory Visit: Payer: No Typology Code available for payment source | Admitting: Physical Therapy

## 2023-11-27 ENCOUNTER — Ambulatory Visit: Payer: No Typology Code available for payment source | Admitting: Physical Therapy

## 2023-12-02 ENCOUNTER — Ambulatory Visit: Payer: No Typology Code available for payment source | Admitting: Physical Therapy

## 2023-12-04 ENCOUNTER — Ambulatory Visit: Payer: No Typology Code available for payment source | Admitting: Physical Therapy

## 2023-12-09 ENCOUNTER — Ambulatory Visit: Payer: No Typology Code available for payment source | Admitting: Physical Therapy

## 2023-12-11 ENCOUNTER — Ambulatory Visit: Payer: No Typology Code available for payment source | Admitting: Physical Therapy

## 2023-12-16 ENCOUNTER — Ambulatory Visit: Payer: No Typology Code available for payment source | Admitting: Physical Therapy

## 2023-12-18 ENCOUNTER — Ambulatory Visit: Payer: No Typology Code available for payment source | Admitting: Physical Therapy

## 2023-12-23 ENCOUNTER — Ambulatory Visit: Payer: No Typology Code available for payment source | Admitting: Physical Therapy

## 2023-12-25 ENCOUNTER — Ambulatory Visit: Payer: No Typology Code available for payment source | Admitting: Physical Therapy

## 2023-12-30 ENCOUNTER — Ambulatory Visit: Payer: No Typology Code available for payment source | Admitting: Physical Therapy

## 2024-01-01 ENCOUNTER — Ambulatory Visit: Payer: No Typology Code available for payment source | Admitting: Physical Therapy

## 2024-01-06 ENCOUNTER — Ambulatory Visit: Payer: No Typology Code available for payment source | Admitting: Physical Therapy

## 2024-01-08 ENCOUNTER — Ambulatory Visit: Payer: No Typology Code available for payment source | Admitting: Physical Therapy

## 2024-01-13 ENCOUNTER — Ambulatory Visit: Payer: No Typology Code available for payment source | Admitting: Physical Therapy

## 2024-01-15 ENCOUNTER — Ambulatory Visit: Payer: No Typology Code available for payment source | Admitting: Physical Therapy

## 2024-01-20 ENCOUNTER — Ambulatory Visit: Payer: No Typology Code available for payment source | Admitting: Physical Therapy

## 2024-01-22 ENCOUNTER — Ambulatory Visit: Payer: No Typology Code available for payment source | Admitting: Physical Therapy

## 2024-03-26 MED ORDER — PERFLUTREN LIPID MICROSPHERE
1.0000 mL | INTRAVENOUS | Status: AC | PRN
  Administered 2023-08-12: 2 mL via INTRAVENOUS

## 2024-03-26 NOTE — Addendum Note (Signed)
 Encounter addended by: Bradd Eleanor CROME, RDCS on: 03/26/2024 12:55 PM  Actions taken: Imaging Exam ended, Order list changed, MAR administration edited, MAR administration accepted
